# Patient Record
Sex: Female | Born: 1970
Health system: Southern US, Community
[De-identification: ages and names within clinical notes are randomized; demographics above are authoritative.]

## PROBLEM LIST (undated history)

## (undated) ENCOUNTER — Emergency Department (HOSPITAL_COMMUNITY): Discharge status: Against Medical Advice | Payer: BC Managed Care – PPO

## (undated) ENCOUNTER — Emergency Department (HOSPITAL_COMMUNITY): Admission: EM | Payer: Self-pay | Source: Home / Self Care

## (undated) DIAGNOSIS — B1910 Unspecified viral hepatitis B without hepatic coma: Secondary | ICD-10-CM

## (undated) DIAGNOSIS — I82409 Acute embolism and thrombosis of unspecified deep veins of unspecified lower extremity: Secondary | ICD-10-CM

## (undated) DIAGNOSIS — J302 Other seasonal allergic rhinitis: Secondary | ICD-10-CM

## (undated) DIAGNOSIS — E119 Type 2 diabetes mellitus without complications: Secondary | ICD-10-CM

## (undated) DIAGNOSIS — E7800 Pure hypercholesterolemia, unspecified: Secondary | ICD-10-CM

## (undated) DIAGNOSIS — I2724 Chronic thromboembolic pulmonary hypertension: Secondary | ICD-10-CM

## (undated) DIAGNOSIS — I2699 Other pulmonary embolism without acute cor pulmonale: Secondary | ICD-10-CM

## (undated) DIAGNOSIS — D6859 Other primary thrombophilia: Secondary | ICD-10-CM

## (undated) HISTORY — DX: Type 2 diabetes mellitus without complications: E11.9

## (undated) HISTORY — DX: Other primary thrombophilia: D68.59

## (undated) HISTORY — DX: Unspecified viral hepatitis B without hepatic coma: B19.10

## (undated) HISTORY — PX: VENA CAVA FILTER PLACEMENT: SUR1032

## (undated) HISTORY — DX: Pure hypercholesterolemia, unspecified: E78.00

## (undated) HISTORY — DX: Chronic thromboembolic pulmonary hypertension: I27.24

## (undated) HISTORY — DX: Acute embolism and thrombosis of unspecified deep veins of unspecified lower extremity: I82.409

## (undated) HISTORY — DX: Other seasonal allergic rhinitis: J30.2

## (undated) HISTORY — DX: Other pulmonary embolism without acute cor pulmonale: I26.99

---

## 1999-11-25 ENCOUNTER — Emergency Department (HOSPITAL_COMMUNITY): Admission: EM | Admit: 1999-11-25 | Discharge: 1999-11-25 | Payer: Self-pay | Admitting: *Deleted

## 2000-11-21 ENCOUNTER — Inpatient Hospital Stay (HOSPITAL_COMMUNITY): Admission: AD | Admit: 2000-11-21 | Discharge: 2000-11-21 | Payer: Self-pay | Admitting: Obstetrics

## 2000-12-23 ENCOUNTER — Inpatient Hospital Stay (HOSPITAL_COMMUNITY): Admission: AD | Admit: 2000-12-23 | Discharge: 2000-12-23 | Payer: Self-pay | Admitting: *Deleted

## 2001-01-27 ENCOUNTER — Ambulatory Visit (HOSPITAL_COMMUNITY): Admission: RE | Admit: 2001-01-27 | Discharge: 2001-01-27 | Payer: Self-pay | Admitting: *Deleted

## 2001-02-16 ENCOUNTER — Ambulatory Visit (HOSPITAL_COMMUNITY): Admission: RE | Admit: 2001-02-16 | Discharge: 2001-02-16 | Payer: Self-pay | Admitting: *Deleted

## 2001-05-08 ENCOUNTER — Inpatient Hospital Stay (HOSPITAL_COMMUNITY): Admission: AD | Admit: 2001-05-08 | Discharge: 2001-05-08 | Payer: Self-pay | Admitting: *Deleted

## 2001-07-12 ENCOUNTER — Inpatient Hospital Stay (HOSPITAL_COMMUNITY): Admission: AD | Admit: 2001-07-12 | Discharge: 2001-07-16 | Payer: Self-pay | Admitting: Obstetrics

## 2001-07-13 ENCOUNTER — Encounter: Payer: Self-pay | Admitting: *Deleted

## 2003-08-03 DIAGNOSIS — I2699 Other pulmonary embolism without acute cor pulmonale: Secondary | ICD-10-CM

## 2003-08-03 HISTORY — DX: Other pulmonary embolism without acute cor pulmonale: I26.99

## 2004-01-28 IMAGING — CR DG ANKLE COMPLETE 3+V*R*
3 series · 3 of 3 positions shown · non-contrast
Comparison: none

CLINICAL DATA: Lateral malleolar pain.
 RIGHT [D3] VIEW:
 There is mild lateral soft tissue swelling but no evidence of osseous or articular pathology.

[view not recorded (1 of 3)]
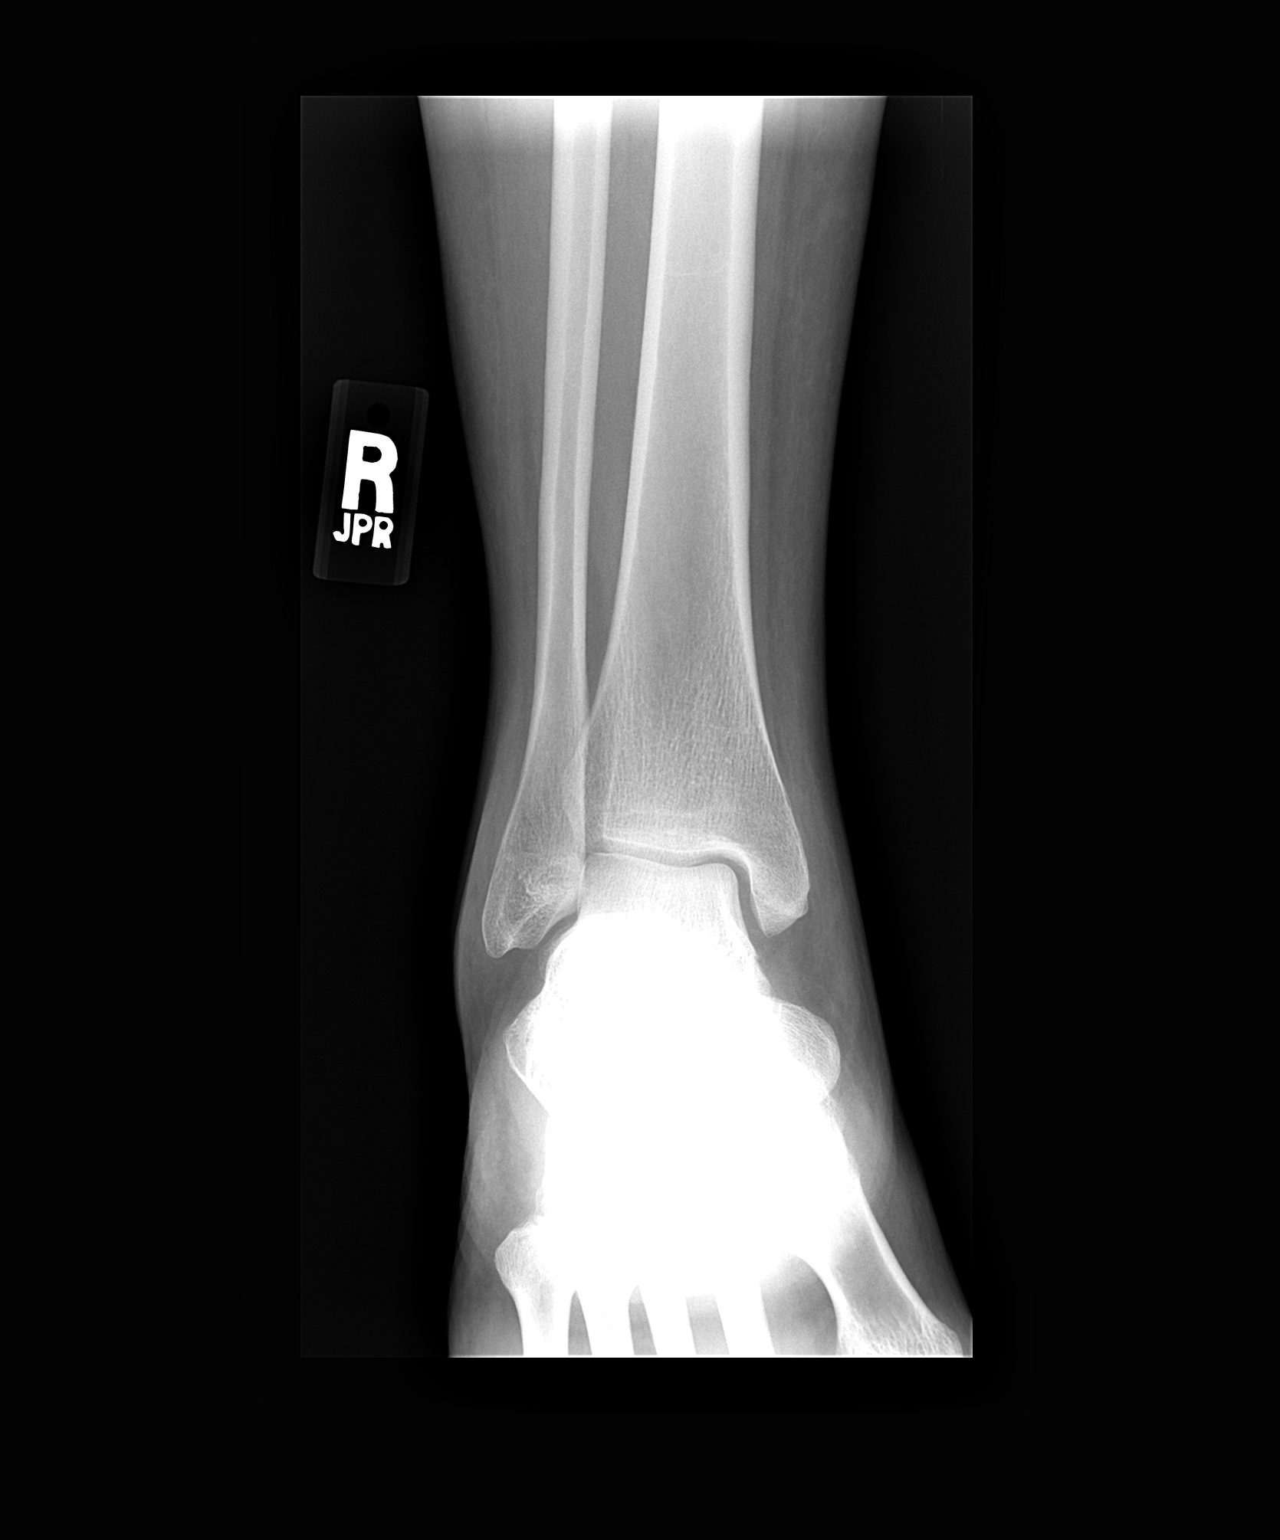

[view not recorded (2 of 3)]
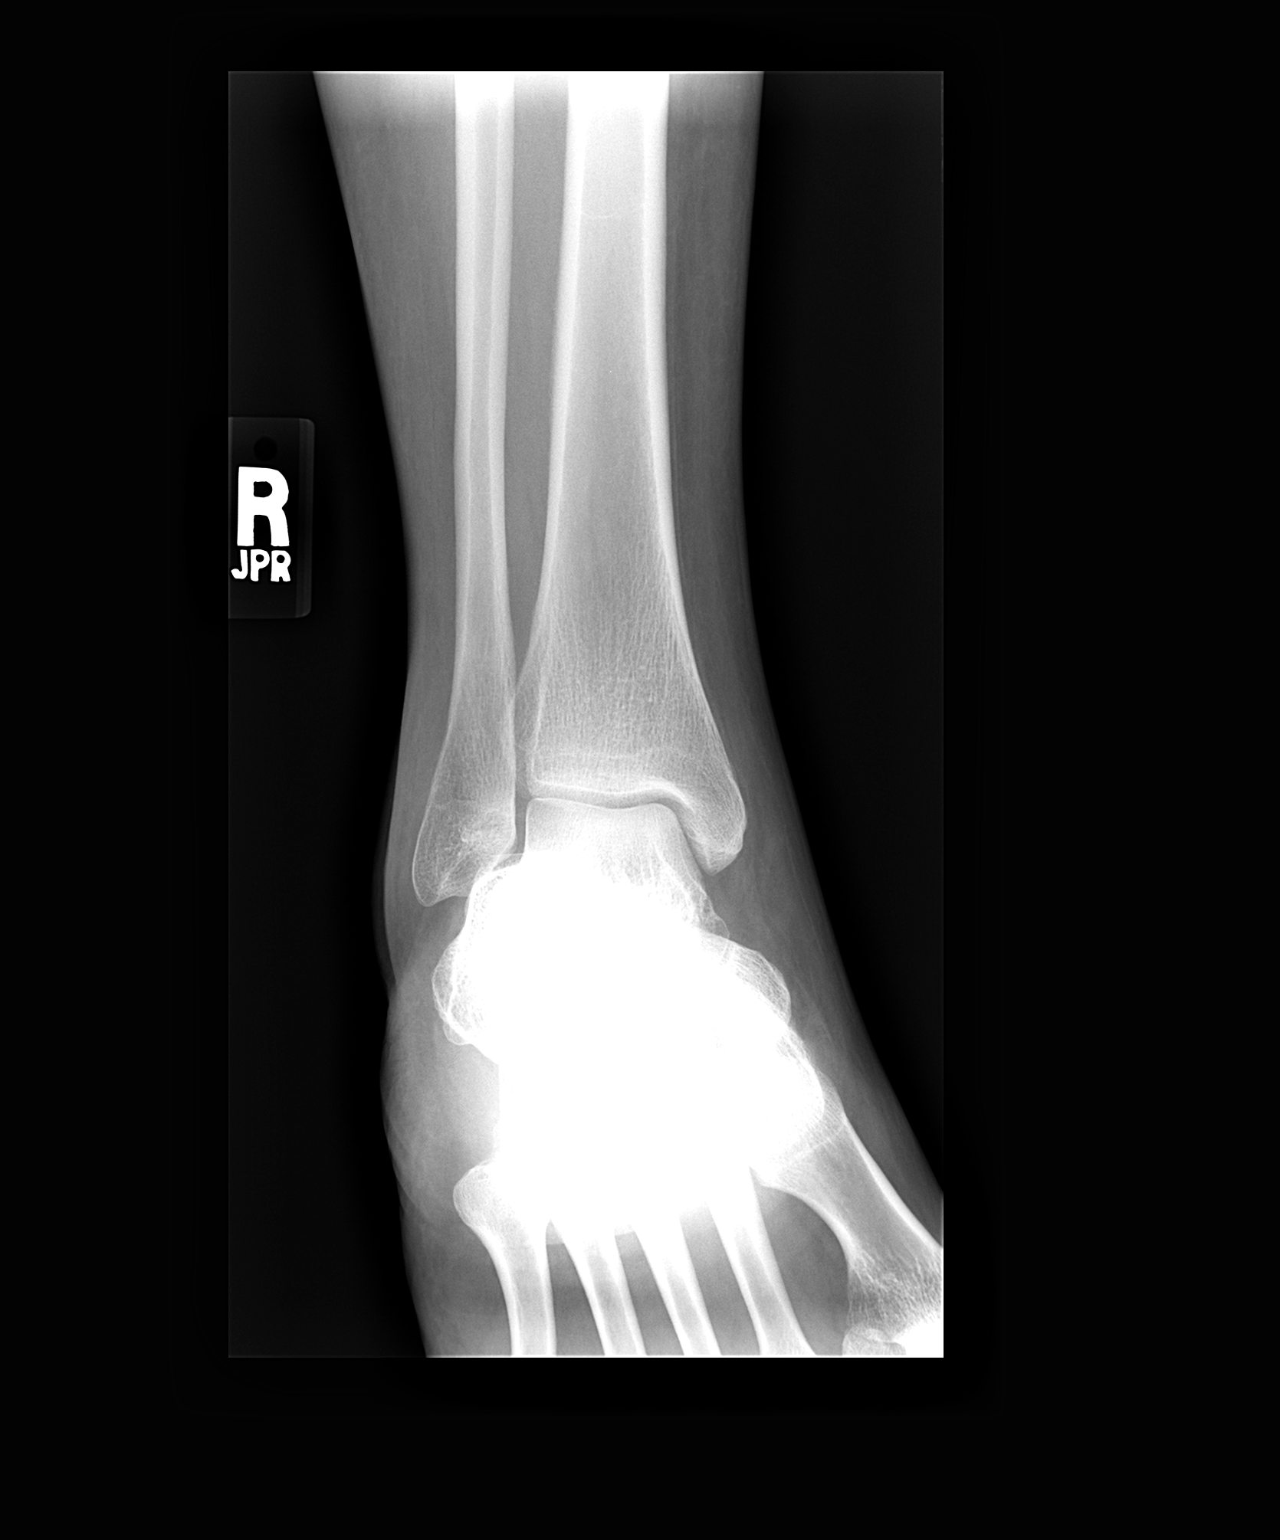

[view not recorded (3 of 3)]
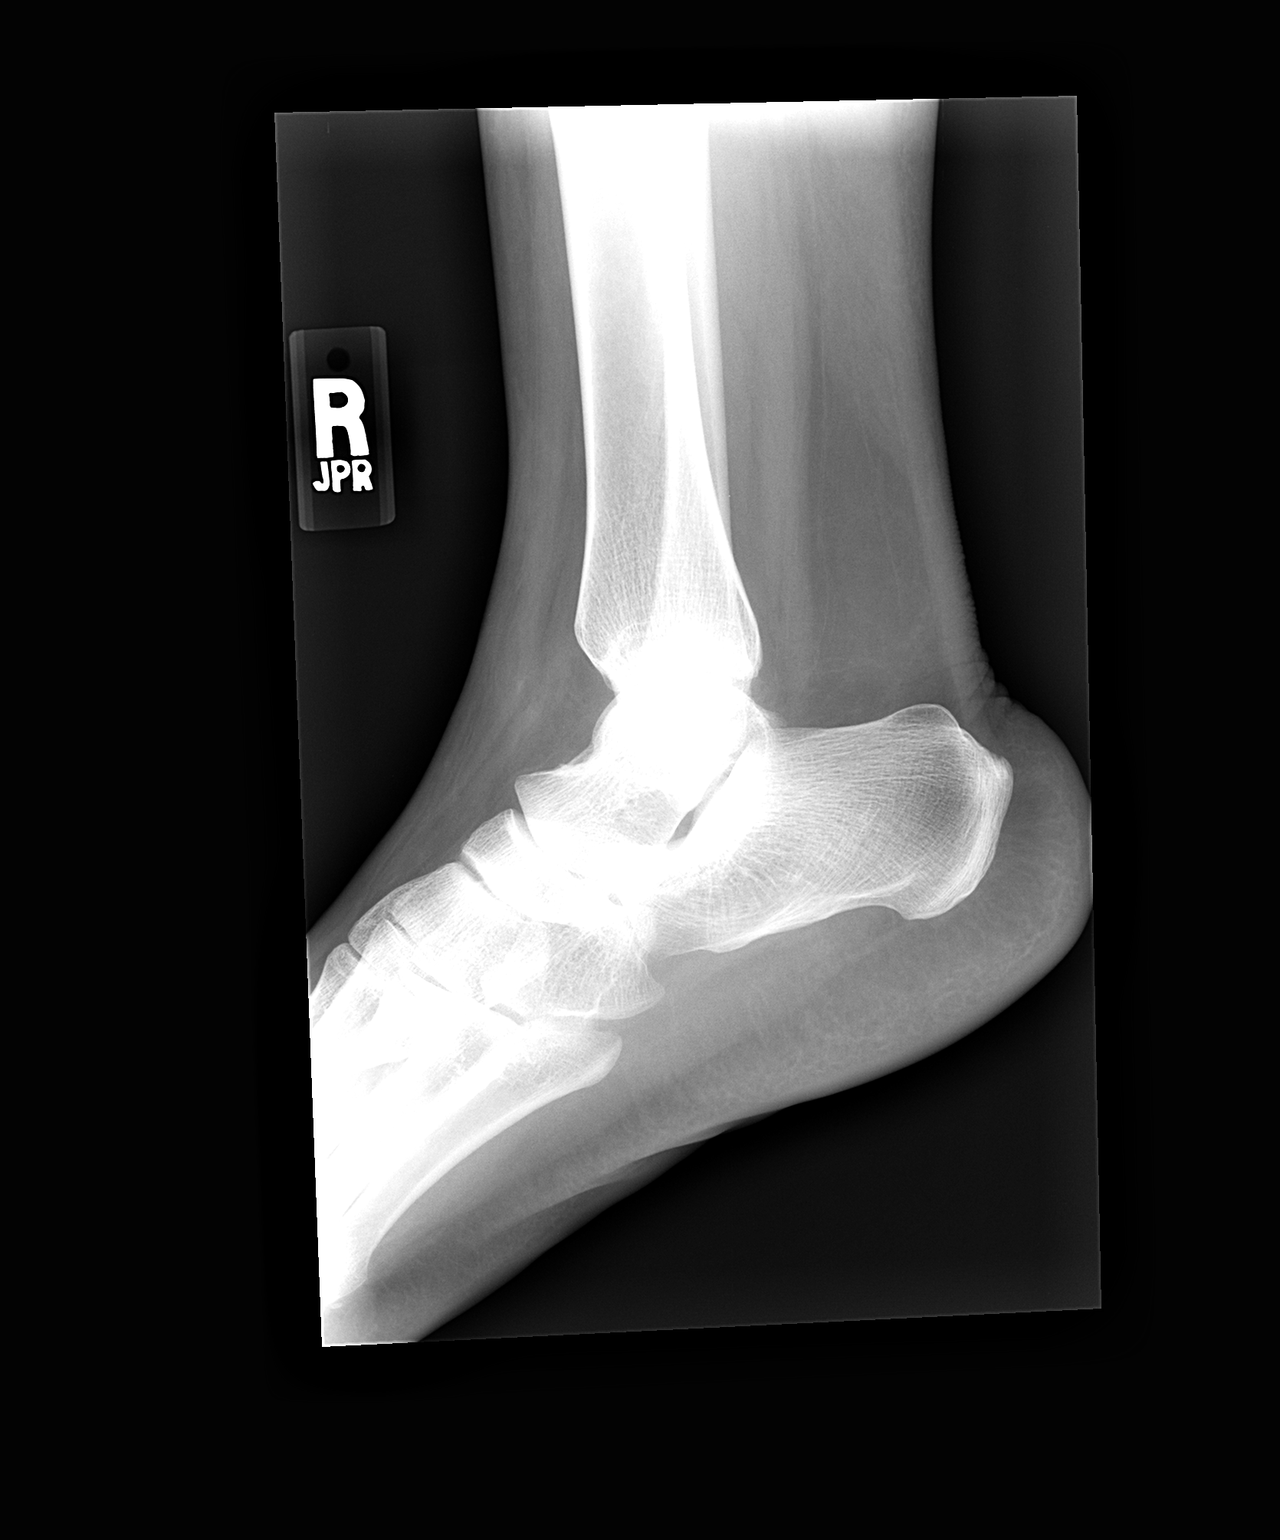

[3 of 3 positions shown; findings below may reference images not displayed]

IMPRESSION: See above report.

## 2004-03-30 ENCOUNTER — Emergency Department (HOSPITAL_COMMUNITY): Admission: EM | Admit: 2004-03-30 | Discharge: 2004-03-30 | Payer: Self-pay | Admitting: Emergency Medicine

## 2004-04-01 ENCOUNTER — Ambulatory Visit: Payer: Self-pay | Admitting: Internal Medicine

## 2004-04-01 ENCOUNTER — Inpatient Hospital Stay (HOSPITAL_COMMUNITY): Admission: EM | Admit: 2004-04-01 | Discharge: 2004-04-08 | Payer: Self-pay | Admitting: Emergency Medicine

## 2004-04-01 IMAGING — CR DG CHEST 1V PORT
1 series · 1 of 1 positions shown · non-contrast
Comparison: none

CLINICAL DATA: Chest pain with shortness of breath.  
 PORTABLE CHEST  
 No prior exams for comparison.
 There is an abnormal density in the left upper lobe contiguous with the left mediastinum. This is likely pneumonia  Unusual mass cannot be excluded.  Recommend PA and lateral for further assessment.  No pleural fluid.  Right lung clear. 
 IMPRESSION
 Abnormal density in the left upper lobe ? favor pneumonia over mass.  Recommend PA and lateral chest.

[view not recorded]
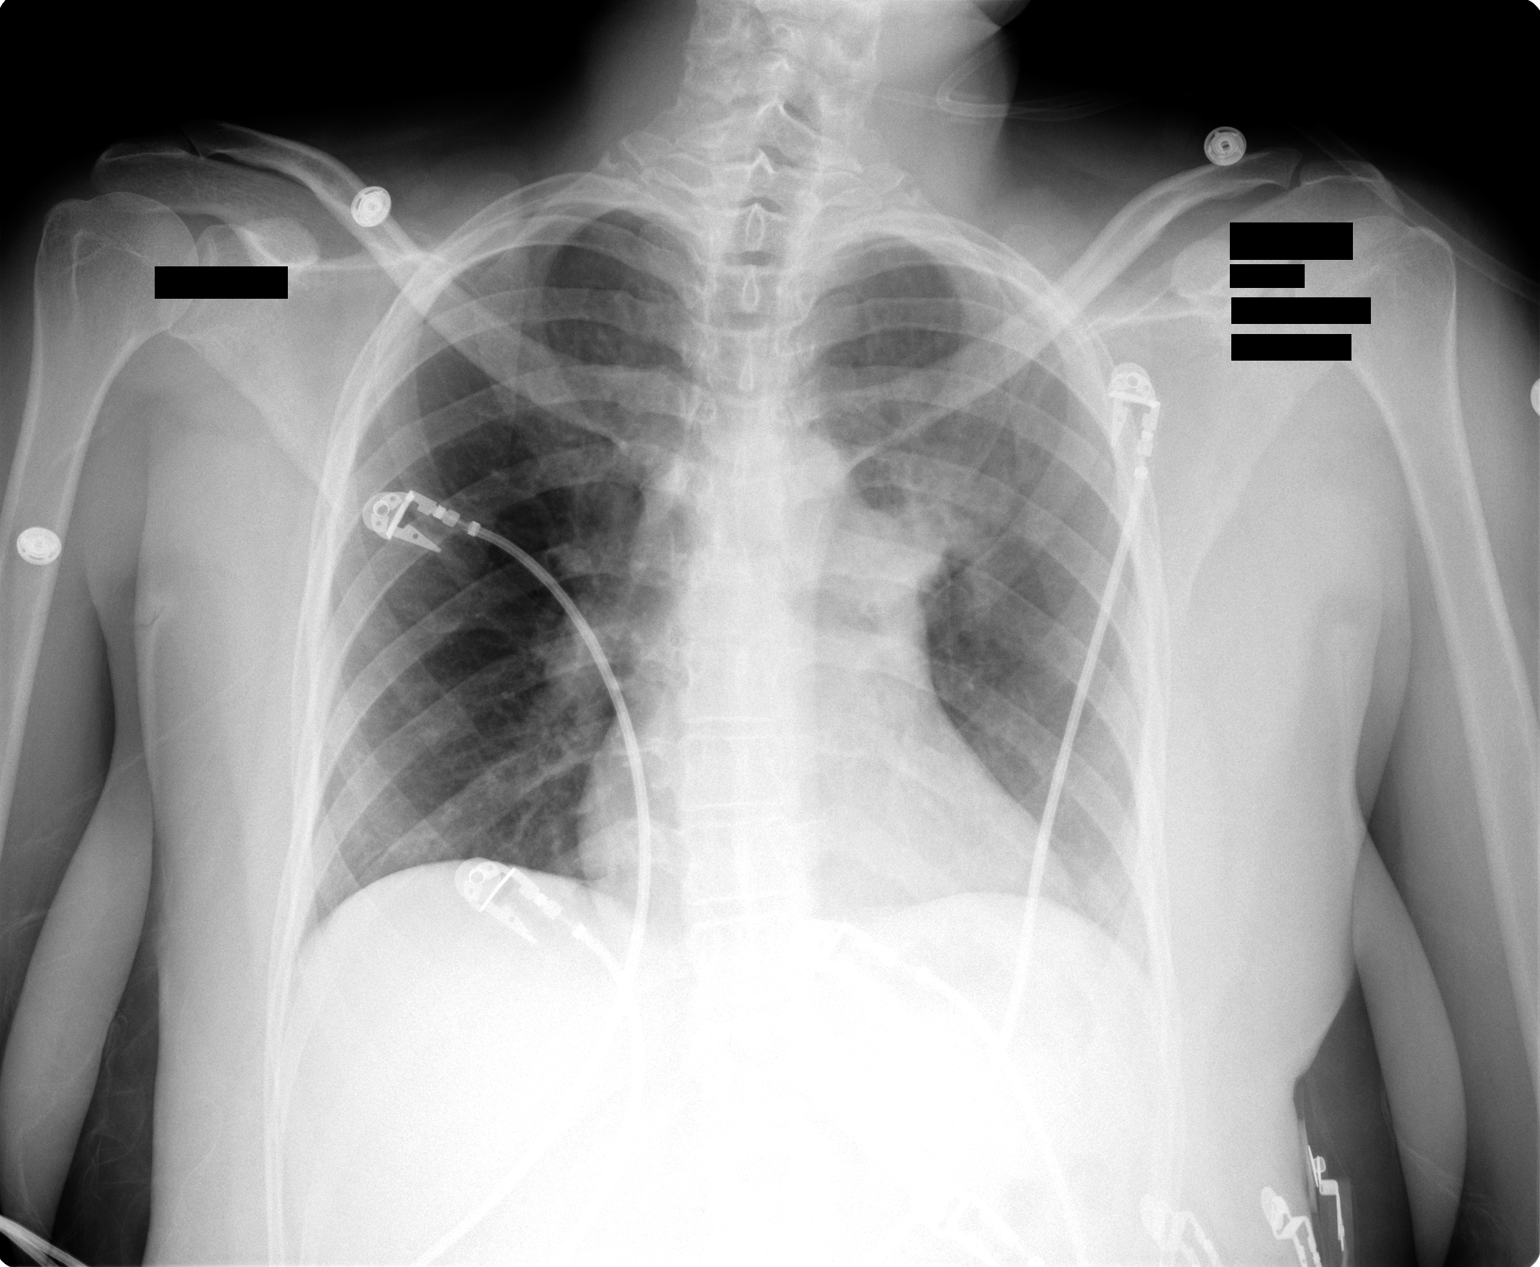

[1 of 1 positions shown; findings below may reference images not displayed]

## 2004-04-01 IMAGING — CT CT ANGIO CHEST
3 of 5 series · 16 of 30 positions shown · IV contrast ([ID] OMNI 300)
Comparison: none

CLINICAL DATA: Shortness of breath.  Left upper lobe mass on plain film.
CHEST CT ANGIO WITH CONTRAST:

[Series 5: recon 3: don't forget off/on re · axial · 0.59mm/px · z∈[-236,-30]mm · 12 of 199 slices shown]
[im 17/199  lung]
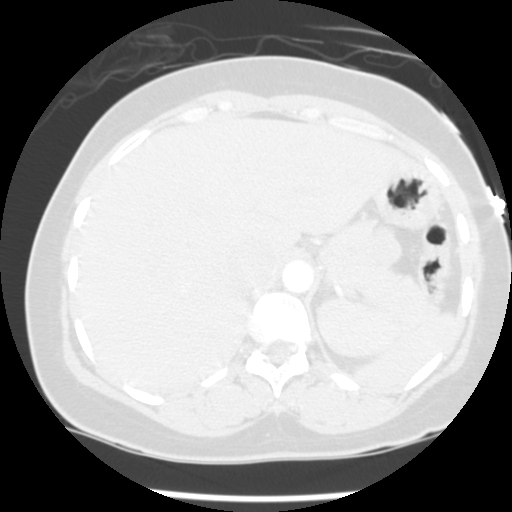
[im 34/199  mediastinal]
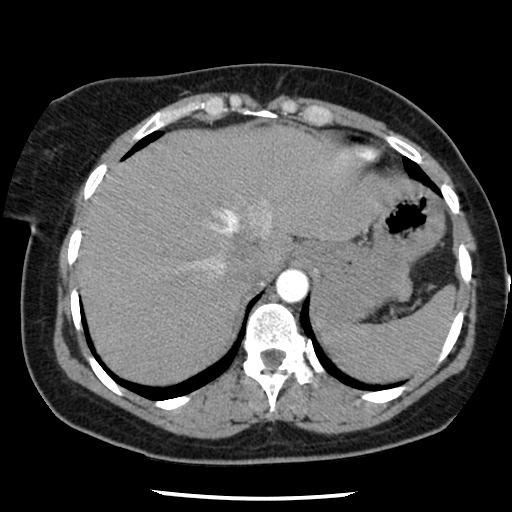
[im 50/199  lung]
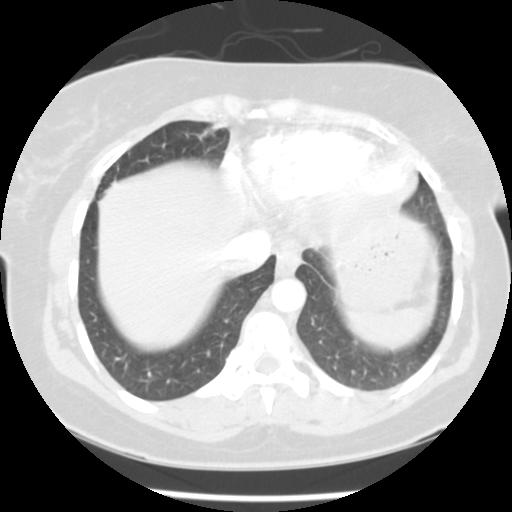
[im 67/199  mediastinal]
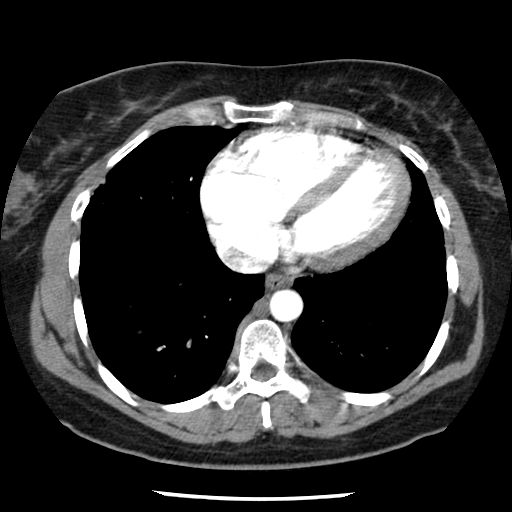
[im 83/199  lung]
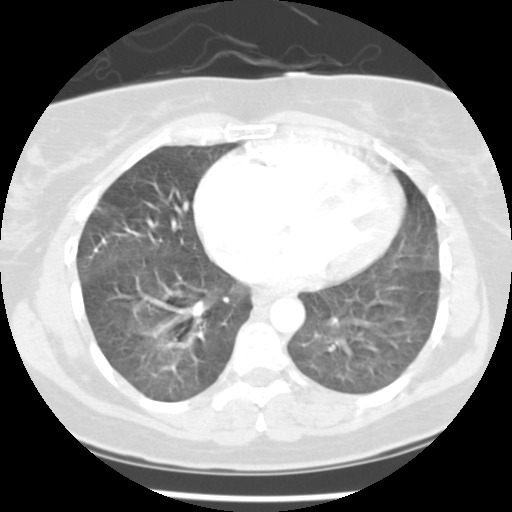
[im 100/199  mediastinal]
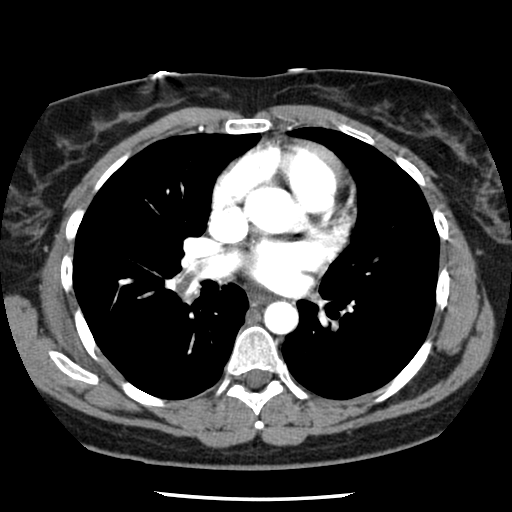
[im 103/199  lung]
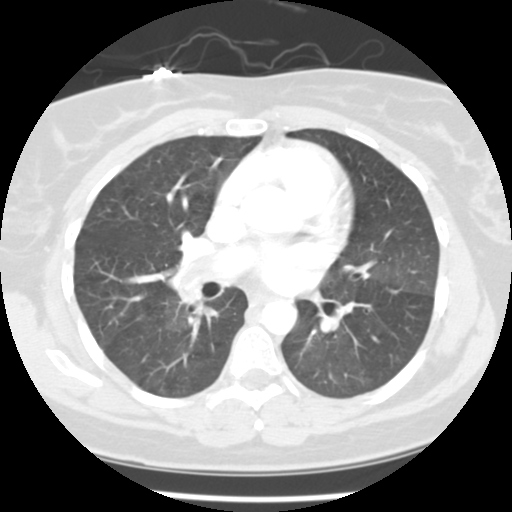
[im 116/199  mediastinal]
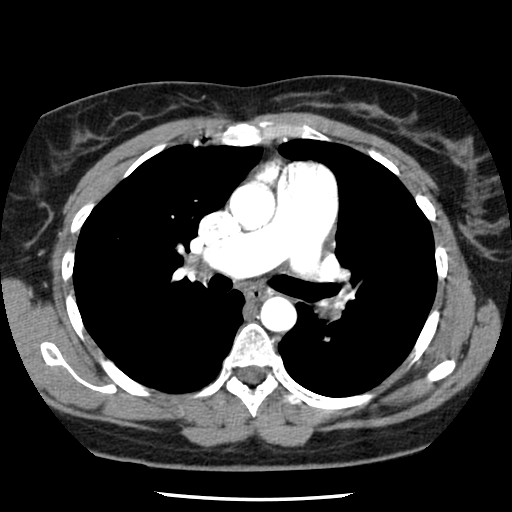
[im 133/199  lung]
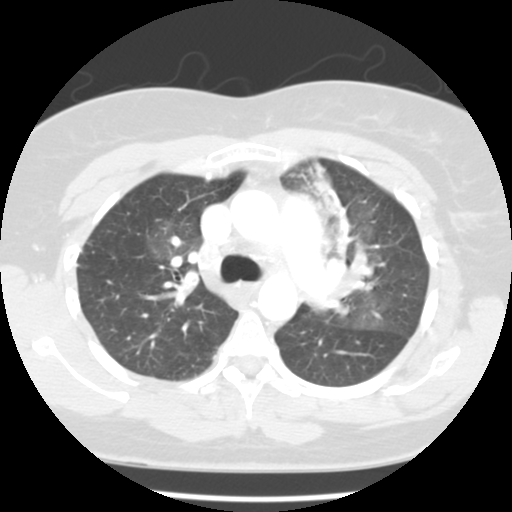
[im 149/199  mediastinal]
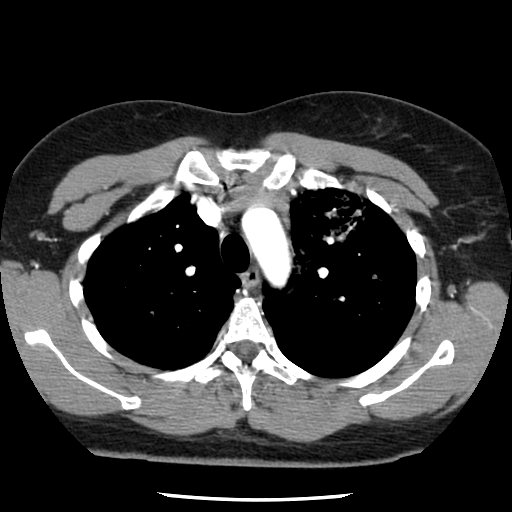
[im 166/199  lung]
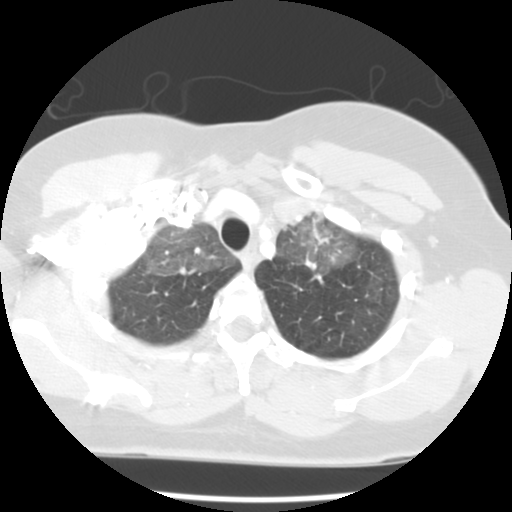
[im 182/199  mediastinal]
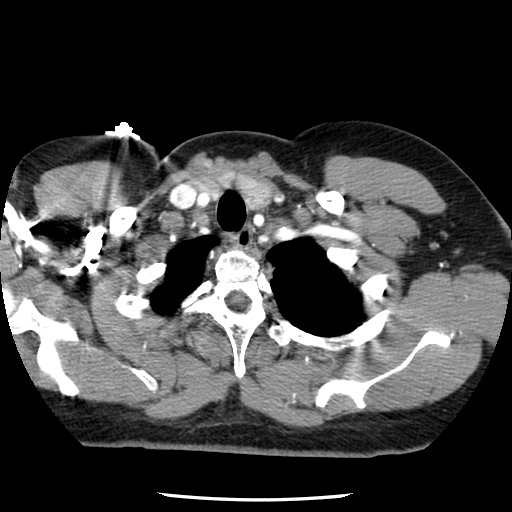

[Series 500: reformatted · sagittal · 0.59mm/px · 2 of 61 slices shown (1 of 2)]
[im 21/61  lung]
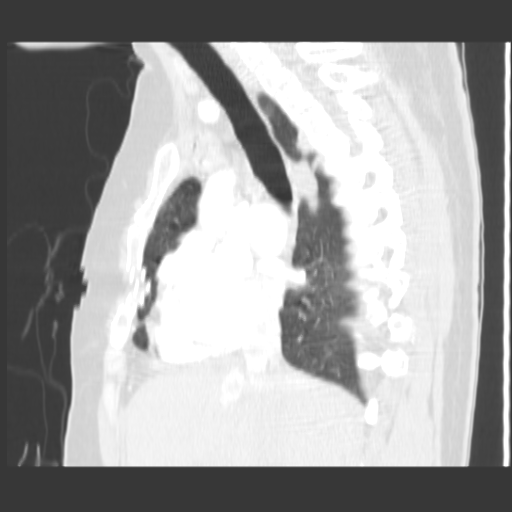
[im 41/61  lung]
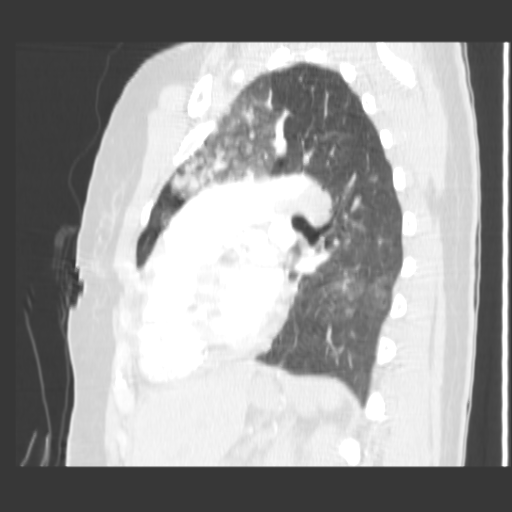

[Series 501: reformatted · coronal · 0.59mm/px · 2 of 61 slices shown (2 of 2)]
[im 21/61  lung]
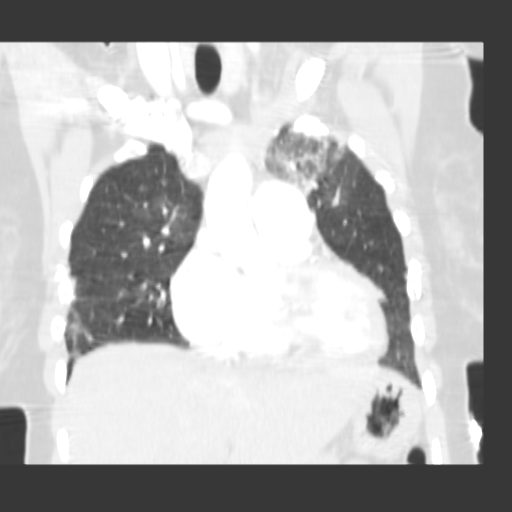
[im 41/61  lung]
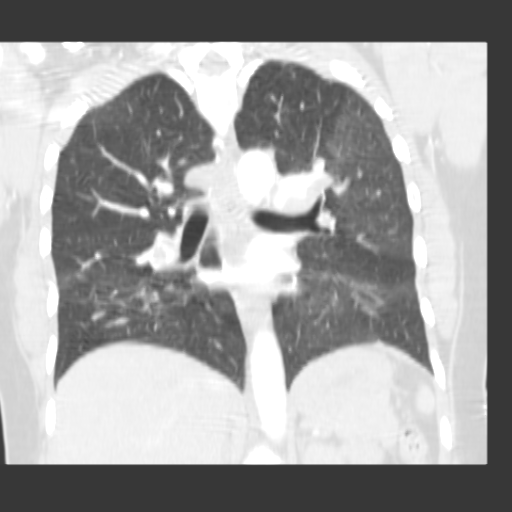

[16 of 30 positions shown; findings below may reference images not displayed]

Multidetector CT imaging of the chest was performed according to the protocol for detection of pulmonary embolism during IV bolus injection of 120 ml Omnipaque 300.  Coronal and sagittal plane reformatted images were also generated.  
There is substantial clot burden identified in both main pulmonary arteries as well as the left upper lobar pulmonary artery and segmental arteries to the right upper lobe.  Clot extends to the lobar pulmonary arteries to both lower lobes.  The heart size is within normal limits.  The main pulmonary outflow tract is enlarged and there is paradoxical reversal of the intraventricular septum, suggesting increased right ventricular heart pressures.  
There is no axillary, mediastinal or hilar lymphadenopathy.  No pleural fluid collections are identified.
Lung windows reveal air space opacity in the anterior left upper lobe with ground glass attenuation in the apex of the right lung.  No other areas of focal air space disease are identified.
A 1 cm fluid density structure in the subcutaneous tissues of the left posterolateral back is likely a sebaceous cyst.
IMPRESSION
1.  Very large embolic burden in both main pulmonary arteries, extending into the lobar and segmental pulmonary arteries to both upper and lower lobes.  Main pulmonary outflow tract is enlarged and there is paradoxical reversal of the intraventricular septum, suggesting elevated right heart pressures.
2.  This report was called to Dr. FERIENHAUS in the [HOSPITAL] the time of interpretation.  
3.  Infarct in the left upper lobe.
4.  Probable sebaceous cyst in the left back.
IMPRESSION:

## 2004-04-01 IMAGING — CR DG CHEST 2V
2 series · 2 of 2 positions shown · non-contrast
Comparison: [DATE].

CLINICAL DATA: 33-year-old male.  Shortness of breath with a left upper lobe focal abnormality.

[view not recorded (1 of 2)]
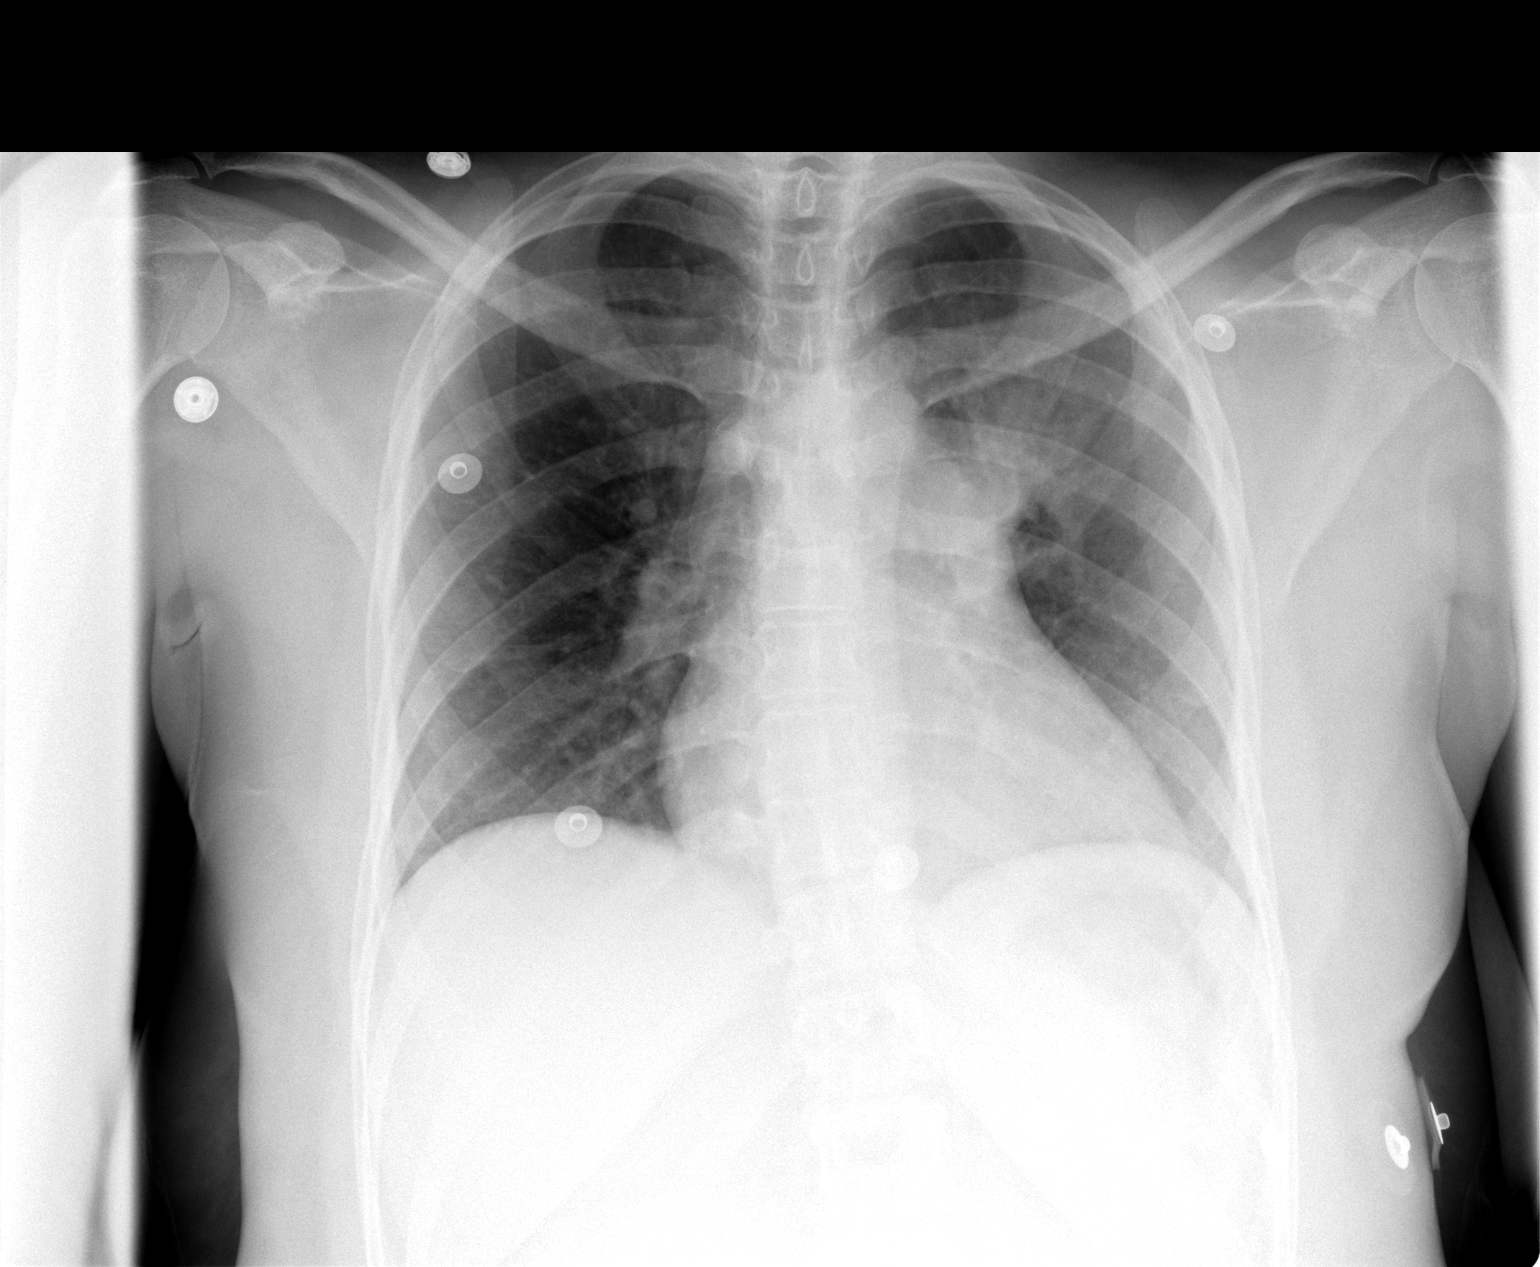

[view not recorded (2 of 2)]
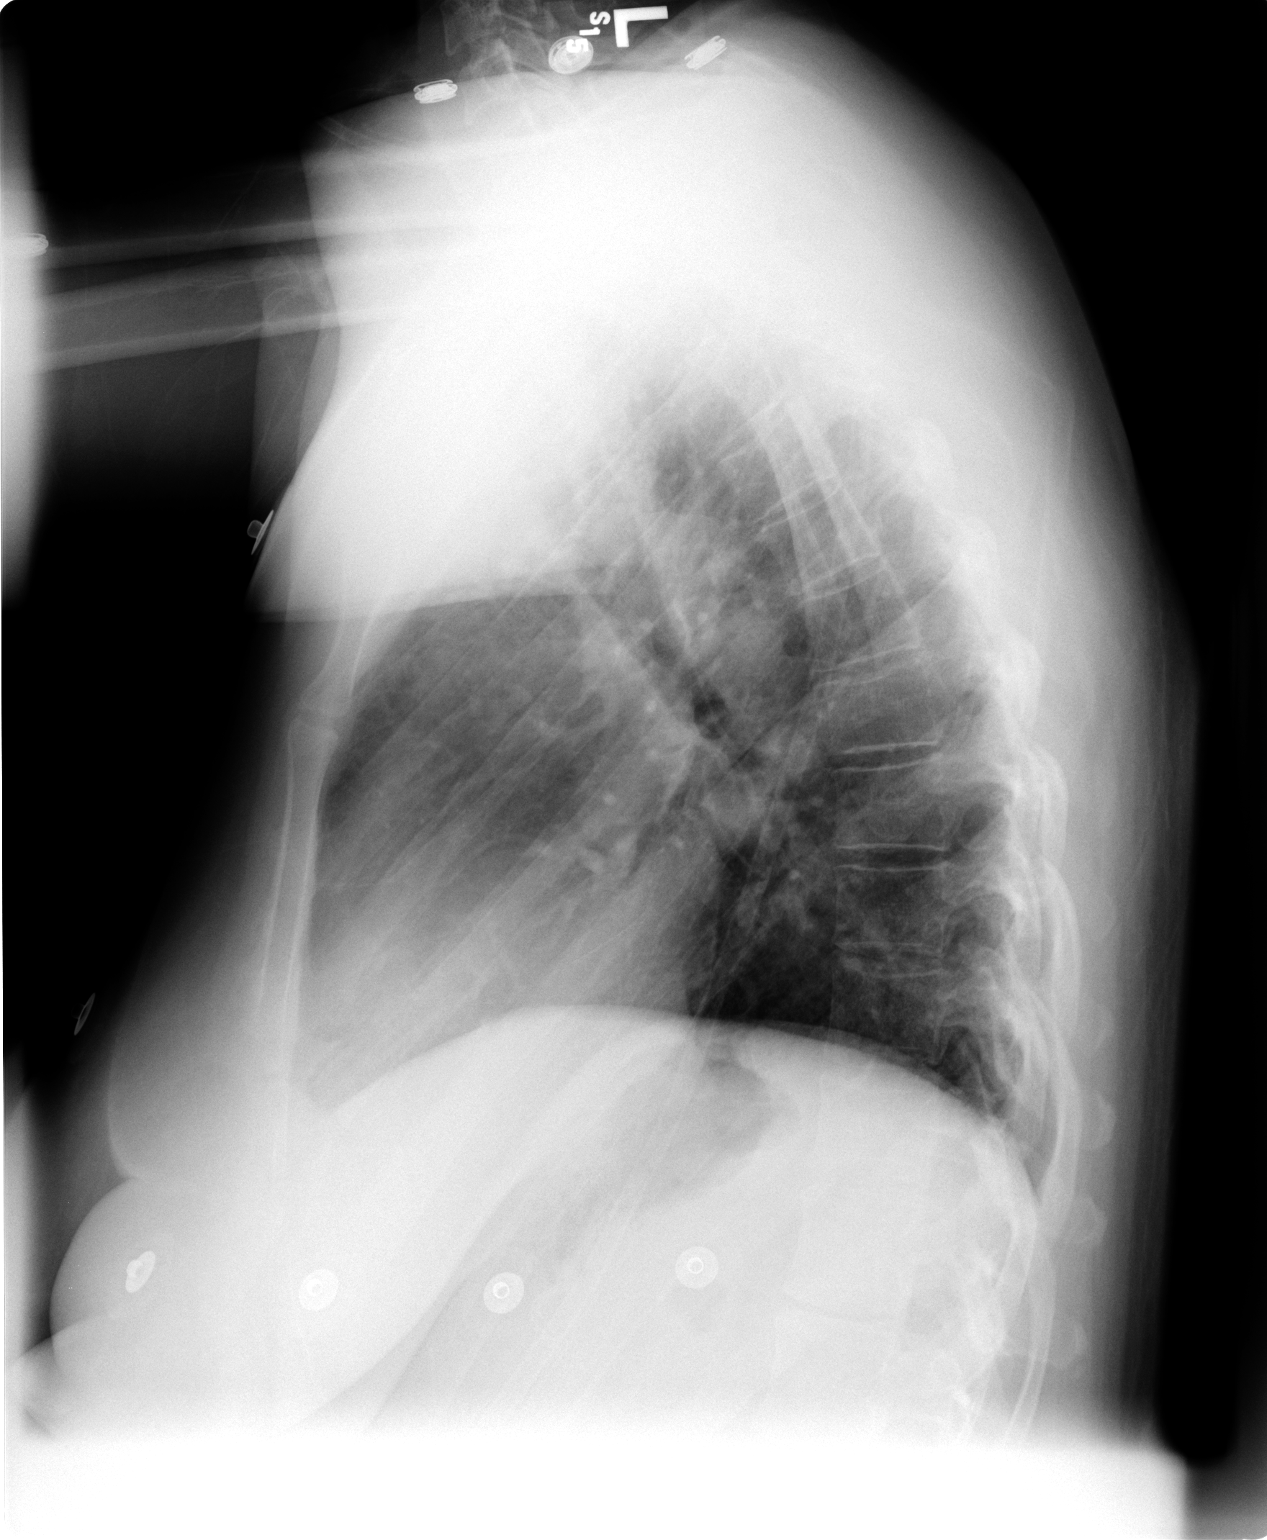

[2 of 2 positions shown; findings below may reference images not displayed]

TWO VIEW CHEST
 There is redemonstration of a left perihilar focal air space opacity concerning for pneumonia or underlying mass.  No associated effusion, CHF, or pneumothorax.  The heart size is prominent for age.  On the lateral view, there is the suggestion of perihilar and mediastinal lymphadenopathy.  
 IMPRESSION
 1.  Left upper lobe focal air space opacity concerning for underlying pneumonia versus parenchymal mass.
 2.  Suggestion of mediastinal and perihilar lymphadenopathy on the lateral view.
 3.  Recommend further evaluation with chest CT with contrast.

## 2004-04-09 ENCOUNTER — Ambulatory Visit: Payer: Self-pay | Admitting: Internal Medicine

## 2004-04-16 ENCOUNTER — Ambulatory Visit: Payer: Self-pay | Admitting: Internal Medicine

## 2004-04-20 ENCOUNTER — Ambulatory Visit: Payer: Self-pay | Admitting: Internal Medicine

## 2004-04-27 ENCOUNTER — Ambulatory Visit: Payer: Self-pay | Admitting: Internal Medicine

## 2004-05-04 ENCOUNTER — Ambulatory Visit: Payer: Self-pay | Admitting: Internal Medicine

## 2004-05-11 ENCOUNTER — Ambulatory Visit: Payer: Self-pay | Admitting: Internal Medicine

## 2004-06-01 ENCOUNTER — Ambulatory Visit: Payer: Self-pay | Admitting: Internal Medicine

## 2004-06-15 ENCOUNTER — Ambulatory Visit: Payer: Self-pay | Admitting: Internal Medicine

## 2004-06-20 IMAGING — CR DG CHEST 1V PORT
1 series · 1 of 1 positions shown · non-contrast
Comparison: CT chest [DATE] and chest x-ray on [DATE].

CLINICAL DATA: 34-year-old with shortness of breath.  
 PORTABLE CHEST - 1 VIEW [DATE]:

[view not recorded]
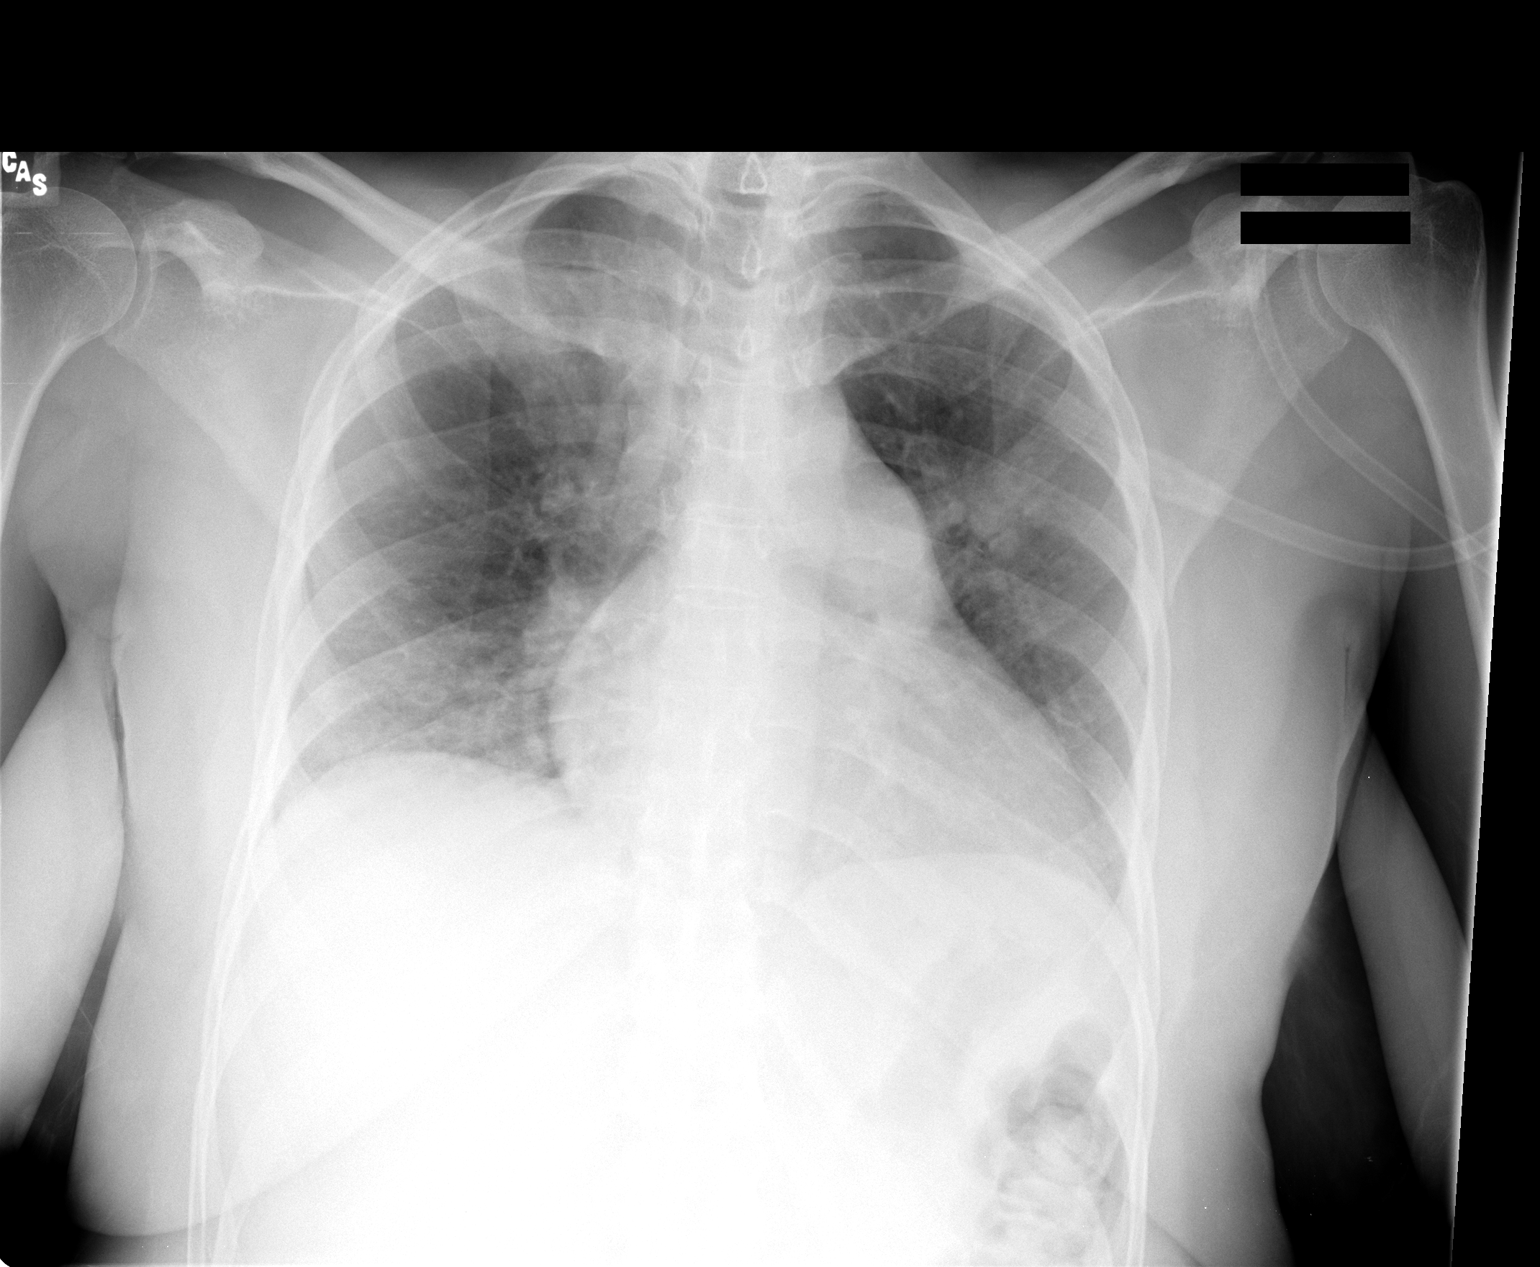

[1 of 1 positions shown; findings below may reference images not displayed]

FINDINGS: The heart is enlarged.  There has been an increase in patchy airspace filling bilaterally.  The findings may be related to edema, alveolitis or infectious process.
IMPRESSION: Increased airspace filling.

## 2004-06-29 ENCOUNTER — Ambulatory Visit: Payer: Self-pay | Admitting: Internal Medicine

## 2004-06-30 ENCOUNTER — Ambulatory Visit: Payer: Self-pay | Admitting: Internal Medicine

## 2004-07-13 ENCOUNTER — Ambulatory Visit: Payer: Self-pay | Admitting: Internal Medicine

## 2004-08-10 ENCOUNTER — Ambulatory Visit: Payer: Self-pay | Admitting: Internal Medicine

## 2004-08-31 ENCOUNTER — Ambulatory Visit: Payer: Self-pay | Admitting: Internal Medicine

## 2004-09-17 ENCOUNTER — Ambulatory Visit (HOSPITAL_COMMUNITY): Admission: RE | Admit: 2004-09-17 | Discharge: 2004-09-17 | Payer: Self-pay | Admitting: Internal Medicine

## 2004-09-17 ENCOUNTER — Ambulatory Visit: Payer: Self-pay | Admitting: Internal Medicine

## 2004-09-17 ENCOUNTER — Encounter (INDEPENDENT_AMBULATORY_CARE_PROVIDER_SITE_OTHER): Payer: Self-pay | Admitting: Internal Medicine

## 2004-09-17 IMAGING — CT CT ANGIO CHEST
1 of 5 series · 14 of 30 positions shown · IV contrast ([ID] OMNI 300)
Comparison: Study of [DATE] which was positive for multiple bilateral emboli.

CLINICAL DATA: Evaluate for pulmonary emboli.
TECHNIQUE: Multidetector CT imaging of the chest was performed according to the protocol for detection of pulmonary embolism during IV bolus injection of 120 cc Omnipaque 300.  Coronal and sagittal plane reformatted images were also generated.   
 CHEST CT ANGIO WITH CONTRAST:

[Series 3: pe · axial · 0.61mm/px · z∈[-271,-53]mm · 14 of 407 slices shown]
[im 30/407  lung]
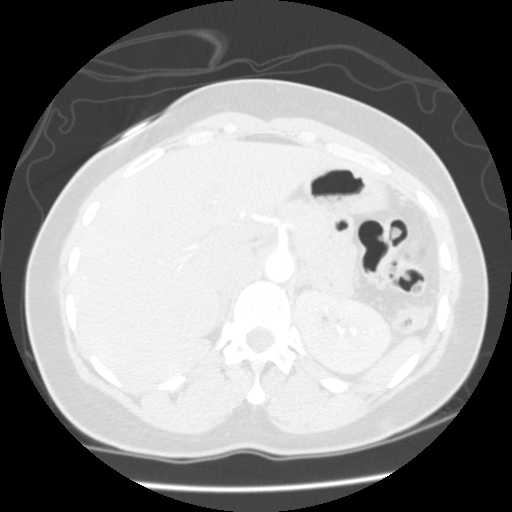
[im 59/407  mediastinal]
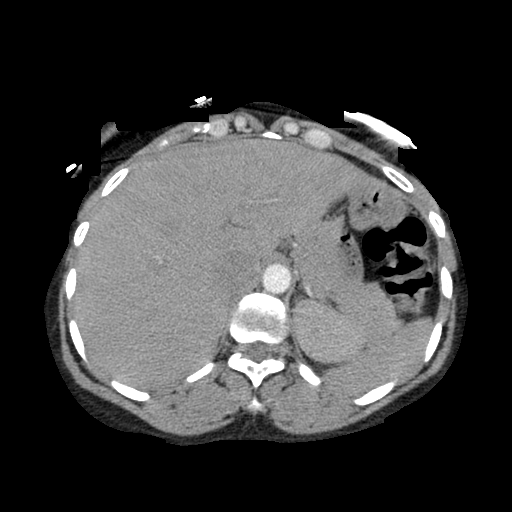
[im 88/407  lung]
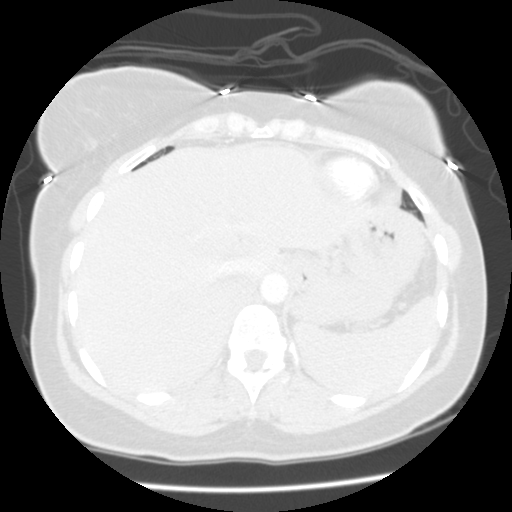
[im 117/407  mediastinal]
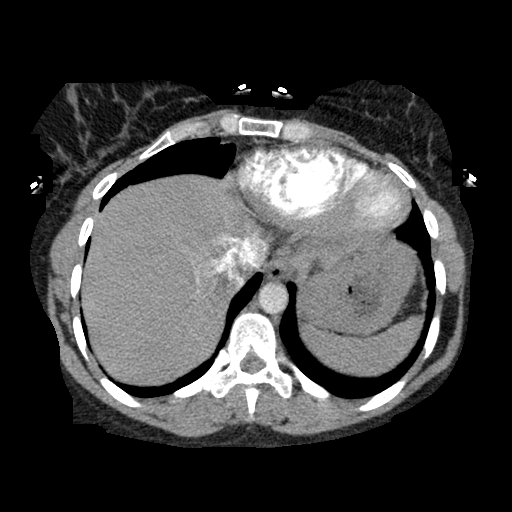
[im 146/407  lung]
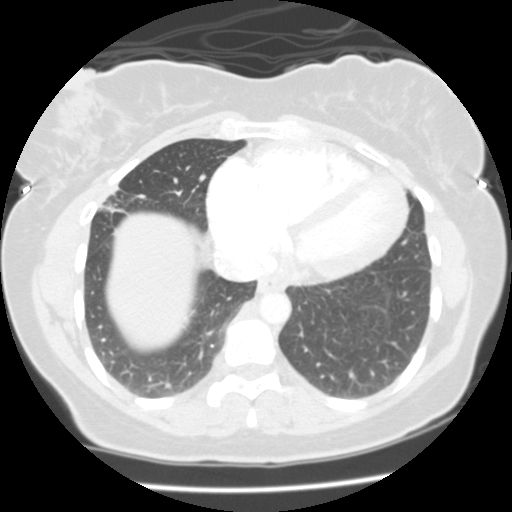
[im 175/407  mediastinal]
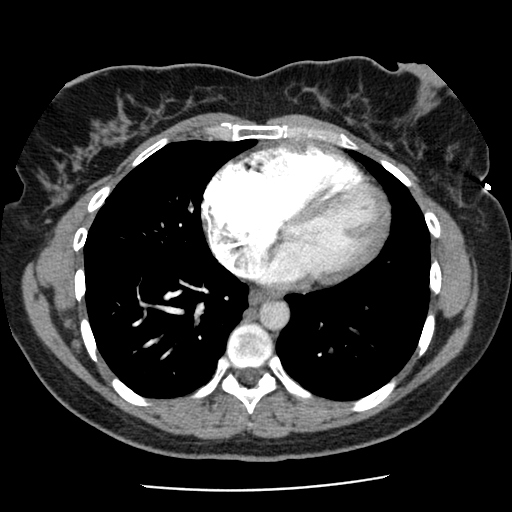
[im 188/407  lung]
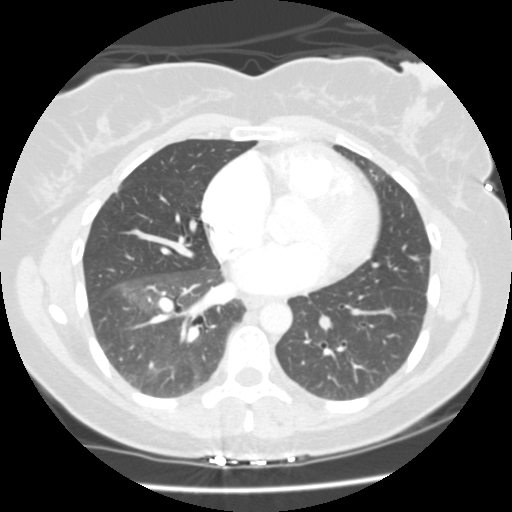
[im 204/407  mediastinal]
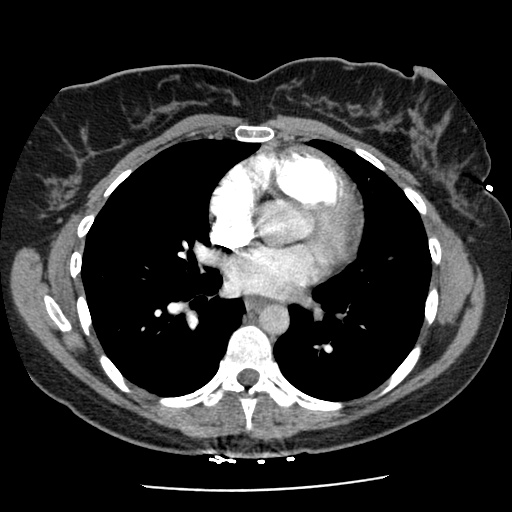
[im 233/407  lung]
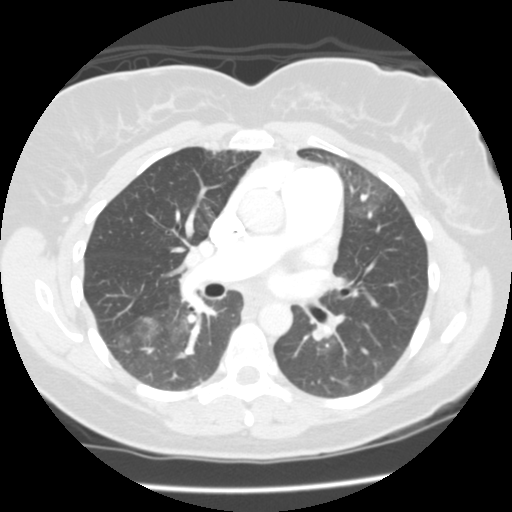
[im 262/407  mediastinal]
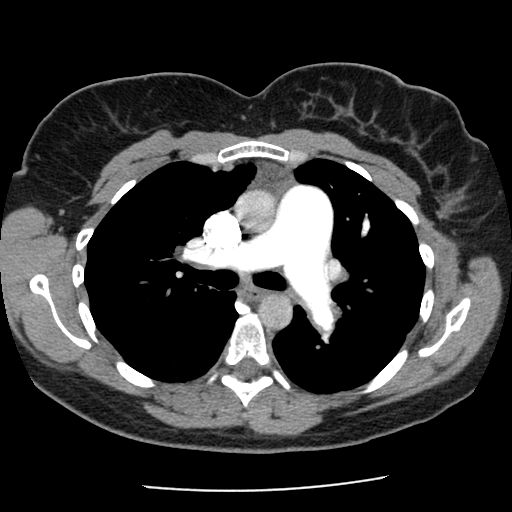
[im 291/407  lung]
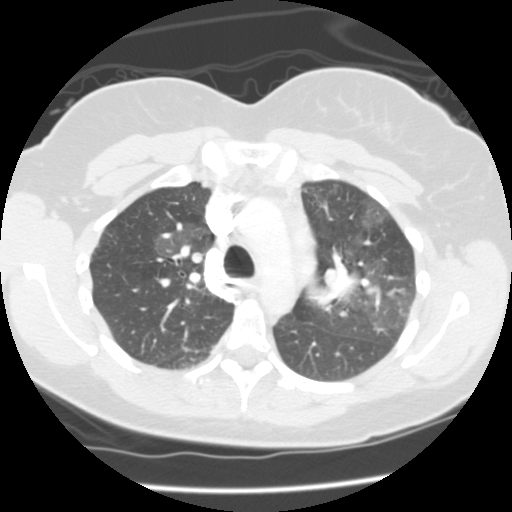
[im 320/407  mediastinal]
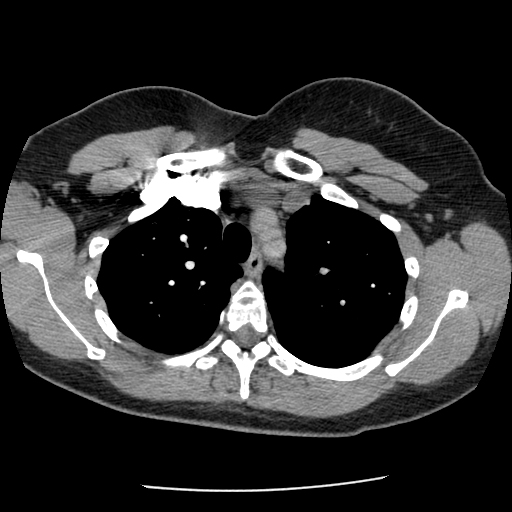
[im 349/407  lung]
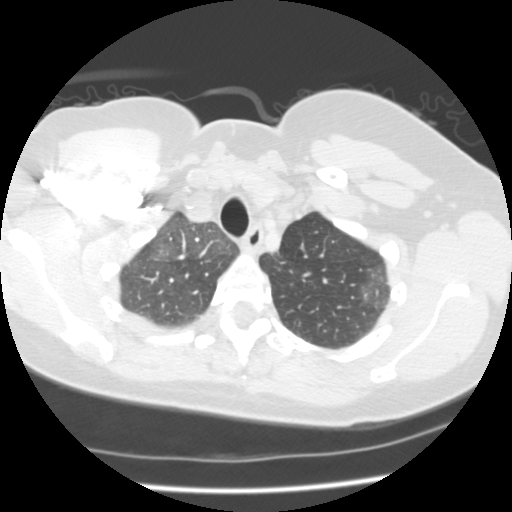
[im 378/407  mediastinal]
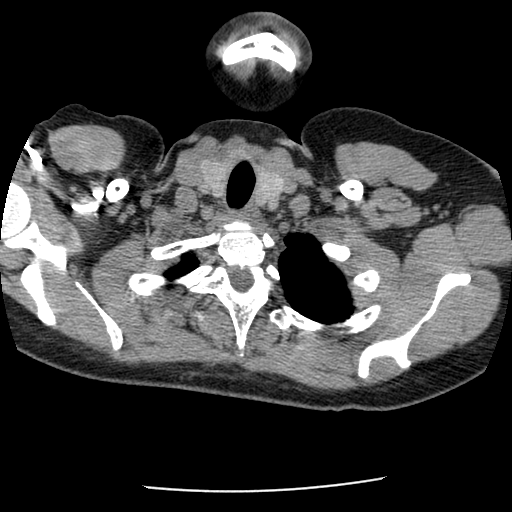

[14 of 30 positions shown; findings below may reference images not displayed]

FINDINGS: There are hazy areas of ground-glass opacity in both upper lobes and in the right lower lobe.  There was abnormal density in these areas on the study of last [REDACTED] and this could be a chronic finding.  Alternatively, it could possibly relate to pneumonitis.  There is no pleural or pericardial fluid.  There is no mediastinal mass.  The patient has a right hilar node which measures 15 mm.  There is a left hilar node that measures a centimeter.   I think there are a few subtle changes of thickening and stranding within the pulmonary arterial tree that I would attribute to chronic changes related to the extensive emboli seen last [REDACTED].  There is nothing that I think represents a convincing   recurrent embolus. 
 Scans in the upper abdomen show no significant finding.
IMPRESSION: 1.  There are changes of wall thickening and a few septations within the pulmonary arterial tree that I think likely relate to the extensive embolic events of last [REDACTED].  I do not see any finding that I can confidently   diagnosis as representing an acute embolus.  
 2.  Areas of ground-glass opacity in the upper lobes and in the right lower lobe.   Many of these areas showed abnormal density on the study of last [REDACTED].  The findings could represent chronic changes. Alternatively, these could be areas of hazy pneumonitis.

## 2004-09-23 ENCOUNTER — Ambulatory Visit: Payer: Self-pay | Admitting: Internal Medicine

## 2004-09-25 ENCOUNTER — Ambulatory Visit: Payer: Self-pay | Admitting: Internal Medicine

## 2004-09-28 ENCOUNTER — Ambulatory Visit: Payer: Self-pay | Admitting: Internal Medicine

## 2004-09-29 ENCOUNTER — Ambulatory Visit (HOSPITAL_COMMUNITY): Admission: RE | Admit: 2004-09-29 | Discharge: 2004-09-29 | Payer: Self-pay | Admitting: Internal Medicine

## 2004-10-02 ENCOUNTER — Ambulatory Visit: Payer: Self-pay | Admitting: Pulmonary Disease

## 2004-10-26 ENCOUNTER — Ambulatory Visit: Payer: Self-pay | Admitting: Internal Medicine

## 2004-11-20 ENCOUNTER — Ambulatory Visit: Payer: Self-pay | Admitting: Pulmonary Disease

## 2004-11-23 ENCOUNTER — Ambulatory Visit: Payer: Self-pay | Admitting: Internal Medicine

## 2004-12-04 ENCOUNTER — Ambulatory Visit: Payer: Self-pay | Admitting: Pulmonary Disease

## 2004-12-21 ENCOUNTER — Ambulatory Visit: Payer: Self-pay | Admitting: Internal Medicine

## 2004-12-30 ENCOUNTER — Ambulatory Visit: Payer: Self-pay | Admitting: Internal Medicine

## 2004-12-30 ENCOUNTER — Ambulatory Visit (HOSPITAL_COMMUNITY): Admission: RE | Admit: 2004-12-30 | Discharge: 2004-12-30 | Payer: Self-pay | Admitting: Internal Medicine

## 2005-01-05 ENCOUNTER — Ambulatory Visit: Payer: Self-pay | Admitting: Pulmonary Disease

## 2005-01-18 ENCOUNTER — Ambulatory Visit: Payer: Self-pay | Admitting: Internal Medicine

## 2005-01-19 ENCOUNTER — Ambulatory Visit: Payer: Self-pay

## 2005-02-08 ENCOUNTER — Ambulatory Visit: Payer: Self-pay | Admitting: Pulmonary Disease

## 2005-02-15 ENCOUNTER — Ambulatory Visit: Payer: Self-pay | Admitting: Internal Medicine

## 2005-03-08 ENCOUNTER — Ambulatory Visit: Payer: Self-pay | Admitting: Internal Medicine

## 2005-03-29 ENCOUNTER — Ambulatory Visit: Payer: Self-pay | Admitting: Internal Medicine

## 2005-03-29 ENCOUNTER — Ambulatory Visit: Payer: Self-pay | Admitting: Pulmonary Disease

## 2005-03-29 ENCOUNTER — Encounter (INDEPENDENT_AMBULATORY_CARE_PROVIDER_SITE_OTHER): Payer: Self-pay | Admitting: Internal Medicine

## 2005-04-19 ENCOUNTER — Ambulatory Visit: Payer: Self-pay | Admitting: Internal Medicine

## 2005-04-27 ENCOUNTER — Ambulatory Visit: Payer: Self-pay | Admitting: Hospitalist

## 2005-04-27 ENCOUNTER — Ambulatory Visit: Payer: Self-pay | Admitting: Cardiology

## 2005-04-27 ENCOUNTER — Encounter: Payer: Self-pay | Admitting: Cardiology

## 2005-04-27 ENCOUNTER — Inpatient Hospital Stay (HOSPITAL_COMMUNITY): Admission: EM | Admit: 2005-04-27 | Discharge: 2005-05-01 | Payer: Self-pay | Admitting: Emergency Medicine

## 2005-04-27 IMAGING — CT CT ANGIO CHEST
3 of 5 series · 18 of 46 positions shown · IV contrast (APPLIED)
Comparison: None.

CLINICAL DATA: Sudden onset of dyspnea and hyperventilation.  History of DVT and pulmonary embolism.  The patient reports being on anticoagulation currently.  
CT ANGIOGRAPHY OF CHEST:
TECHNIQUE: Multidetector CT imaging of the chest was performed during bolus injection of intravenous contrast.  Multiplanar CT angiographic image reconstructions were generated to evaluate the vascular anatomy.
Contrast:  80 cc Omnipaque 300

[Series 4: pulm embolism 2.0 st · axial · 0.49mm/px · z∈[-143,+73]mm · 13 of 127 slices shown (1 of 2)]
[im 10/127  lung]
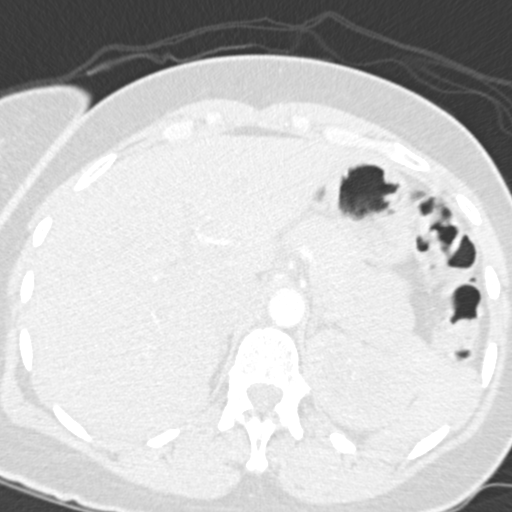
[im 19/127  soft-tissue]
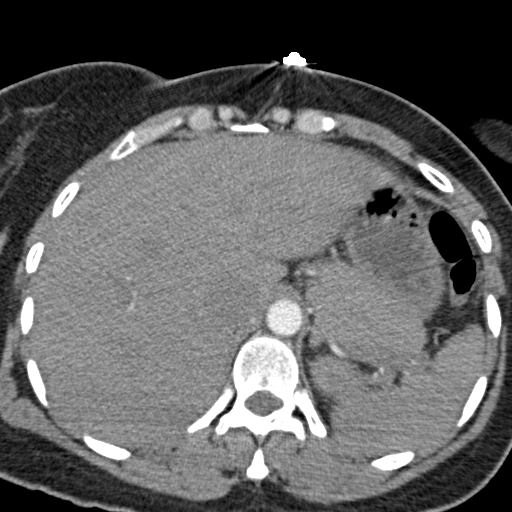
[im 28/127  lung]
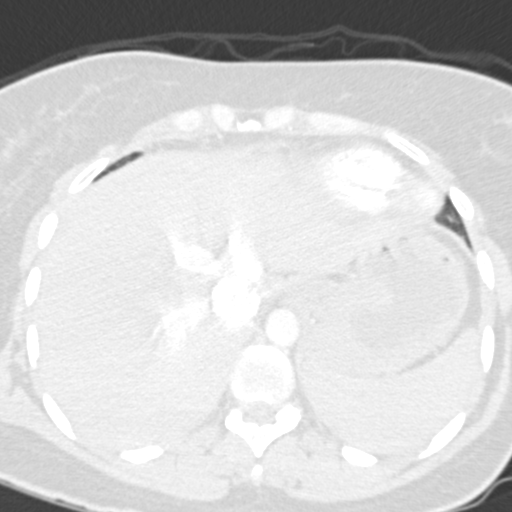
[im 37/127  soft-tissue]
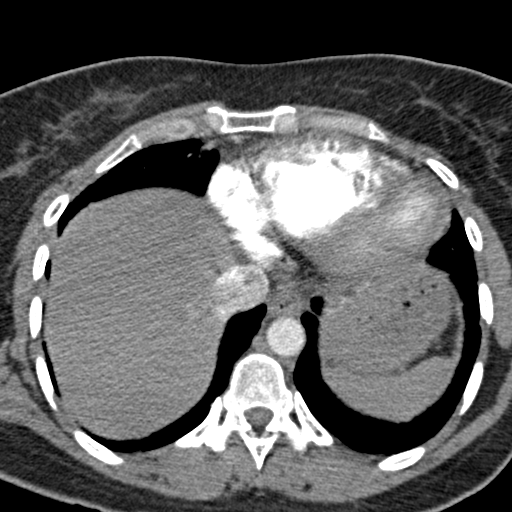
[im 46/127  lung]
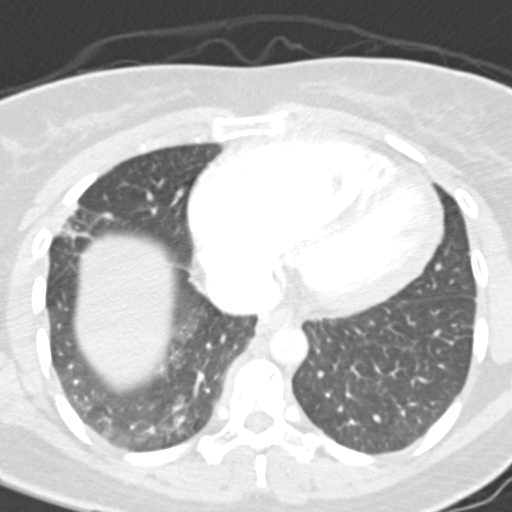
[im 55/127  soft-tissue]
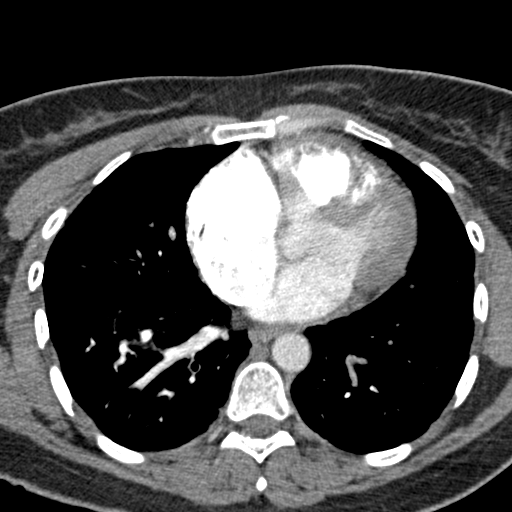
[im 64/127  lung]
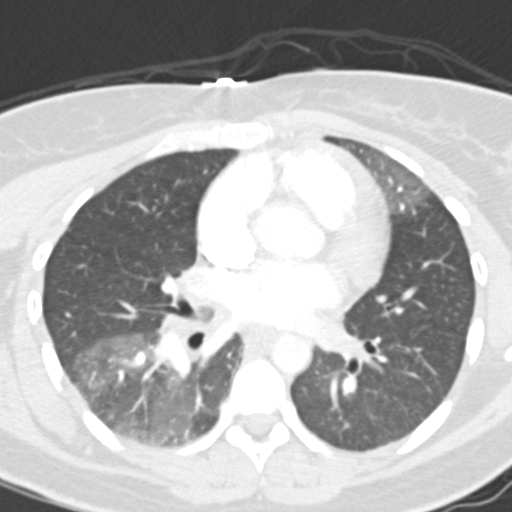
[im 73/127  soft-tissue]
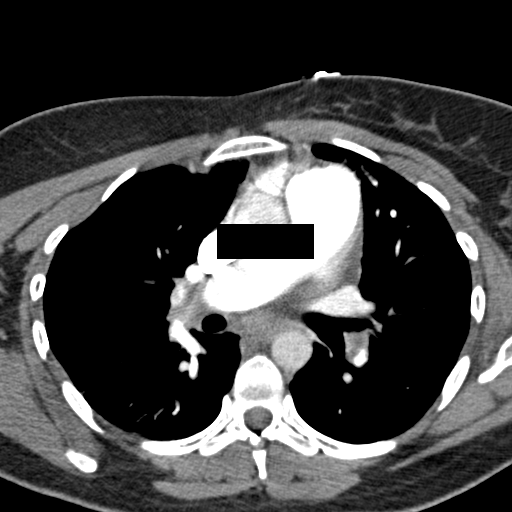
[im 82/127  lung]
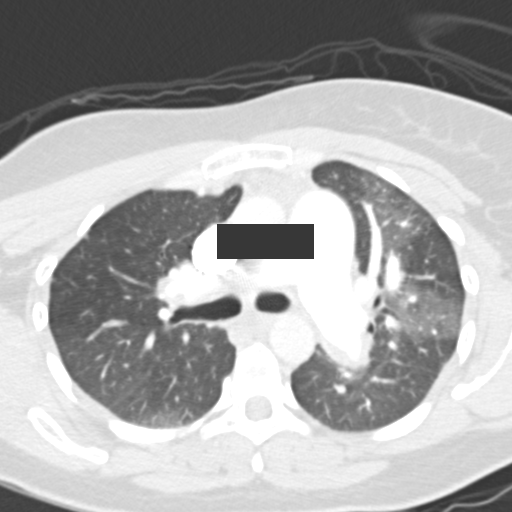
[im 91/127  soft-tissue]
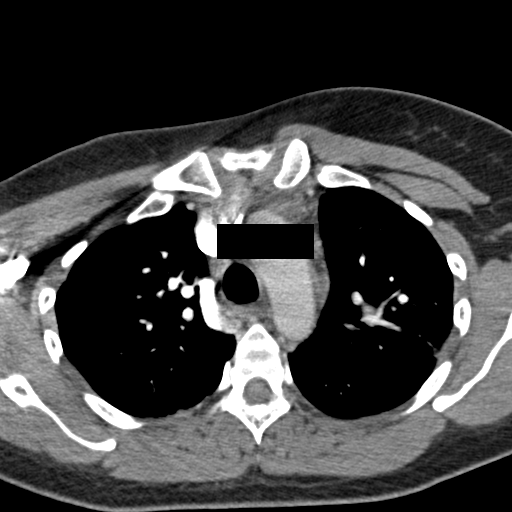
[im 100/127  lung]
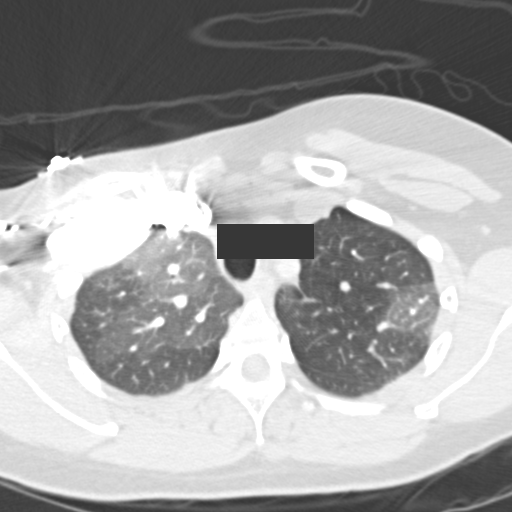
[im 109/127  soft-tissue]
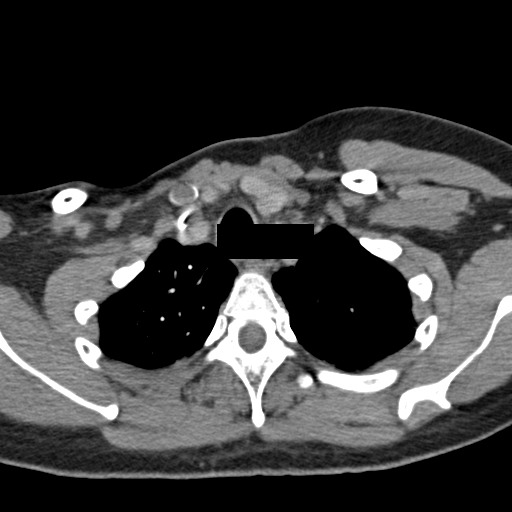
[im 118/127  lung]
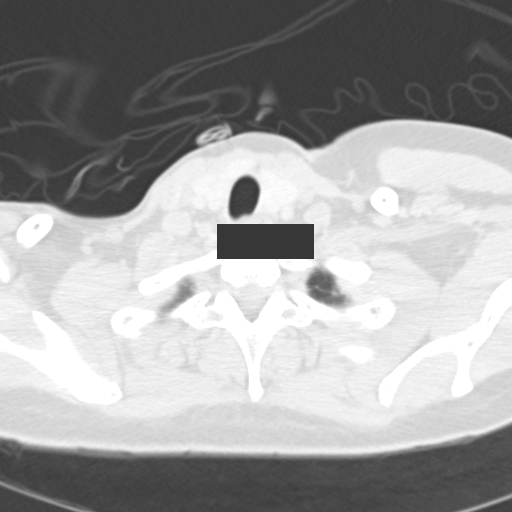

[Series 7: pulm embolism 2.0 st · axial · 0.49mm/px · z∈[-190,-172]mm · 2 of 29 slices shown (2 of 2)]
[im 10/29  lung]
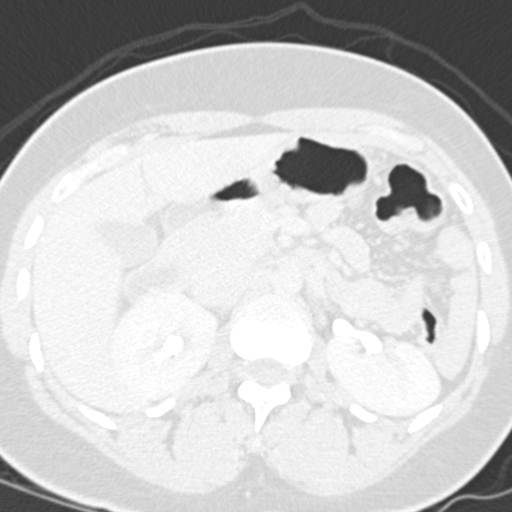
[im 19/29  lung]
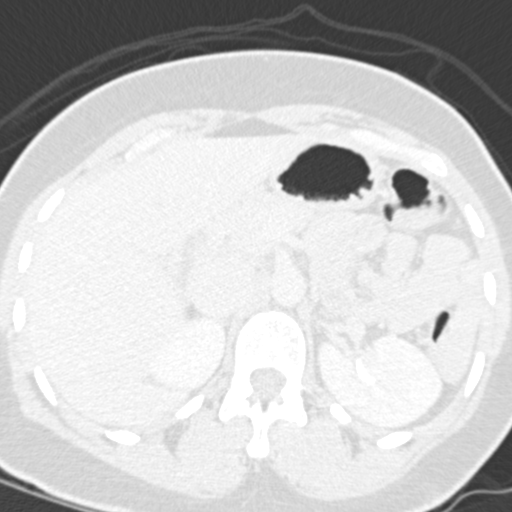

[Series 603: cor · coronal · 0.50mm/px · 3 of 88 slices shown]
[im 30/88  soft-tissue]
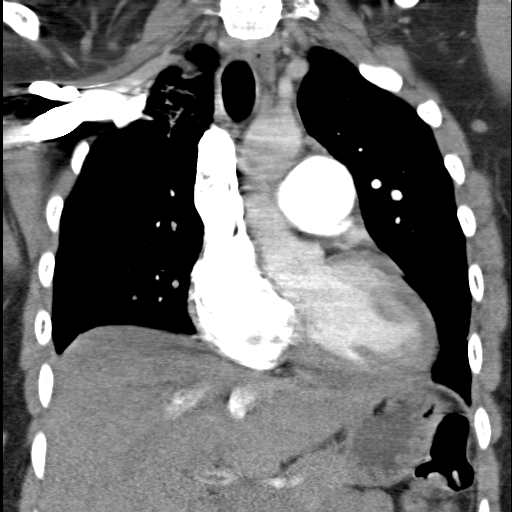
[im 39/88  soft-tissue]
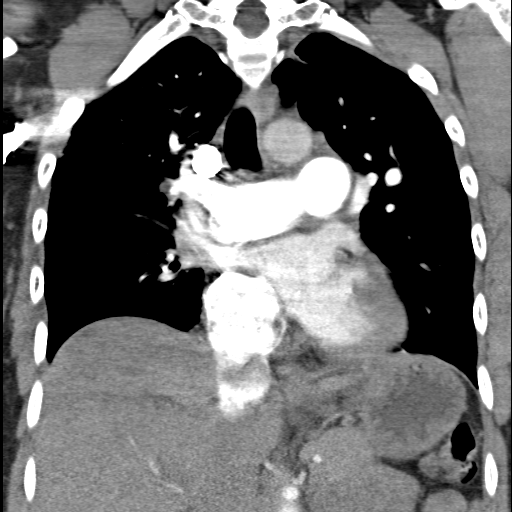
[im 49/88  soft-tissue]
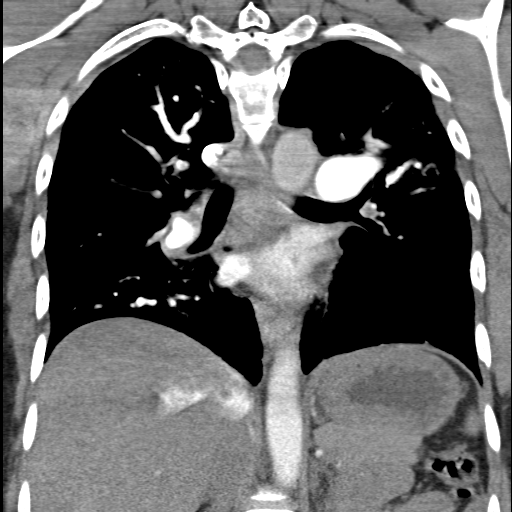

[18 of 46 positions shown; findings below may reference images not displayed]

FINDINGS: The pulmonary arteries are well opacified with contrast.  No central pulmonary emboli are demonstrated.  The main pulmonary artery is dilated, measuring up to 3.5 cm in diameter.  The left lower lobe pulmonary artery is narrowed just distal to the hilum with apparent circumferential wall thickening, suggesting remote pulmonary embolism with recanalization.  Just distal to this, there are ill defined filling defects within the left lower lobe pulmonary artery branches on images 56 through 67 of series 4.  There are similar ill defined filling defects in the right lower lobe pulmonary artery branches on images 66 through 71.  Although not the classic appearance for acute pulmonary embolism, these could reflect subacute non-occlusive emboli. 
The mediastinum has a normal appearance without enlarged lymph nodes.  There is no aortic aneurysm or mediastinal hematoma.  There is no pleural or pericardial effusion. 
On the lung windows, there is a geographic distribution of ground glass density with patchy components in both upper lobes, the lingula, and the superior segment of the right lower lobe.  This mosaic pattern can be seen as a sequela of chronic pulmonary thromboembolism.  There is no confluent airspace opacity or endobronchial lesion. 
Images through the upper abdomen demonstrate retrograde opacification of the IVC and hepatic veins suggesting the possibility of a degree of elevated right heart pressures.  No masses are demonstrated.
IMPRESSION: 1.  There are findings compatible with remote pulmonary thromboembolism with mosaic ground glass opacity throughout both lungs and mural thickening of the left lower lobe pulmonary artery compatible with recanalization.  I cannot exclude recurrent thromboembolism in the segmental branches of both lower lobe pulmonary arteries where there are ill defined filling defects.  These most likely represent subacute emboli.
2.  There is evidence of pulmonary arterial hypertension with retrograde filling of the IVC and hepatic veins which may indicate elevated right heart pressures.

## 2005-04-27 IMAGING — CT CT HEAD W/O CM
1 series · 16 of 30 positions shown, 20 images · IV contrast (agent unspecified)
Comparison: None.

CLINICAL DATA: Headaches and diplopia.
 HEAD CT WITHOUT CONTRAST:
TECHNIQUE: Contiguous axial images were obtained from the base of the skull through the vertex, according to standard protocol, without contrast.

[Series 2: brain w/o 4.8 h40s st · axial · non-contrast · 0.43mm/px · z∈[+1167,+1302]mm · 16 of 30 slices shown, 20 images]
[im 2/30  brain]
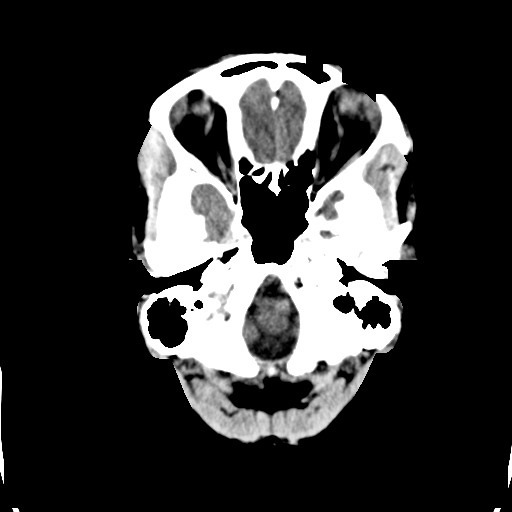
[im 2/30  bone]
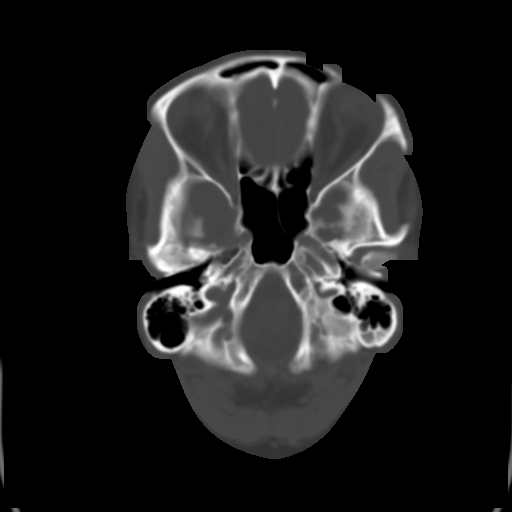
[im 4/30  brain]
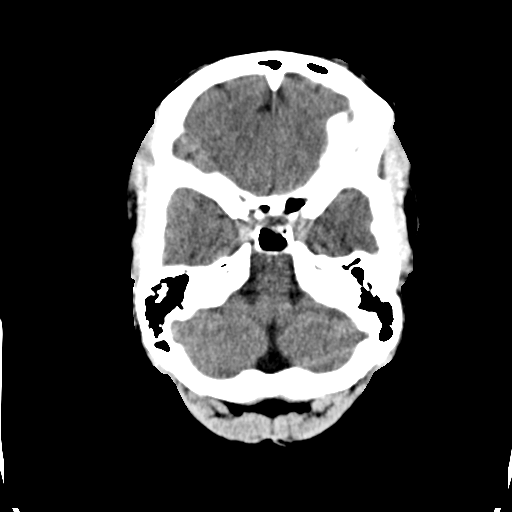
[im 6/30  brain]
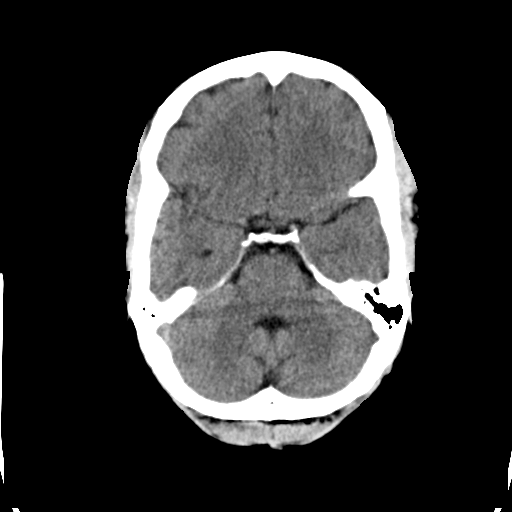
[im 8/30  brain]
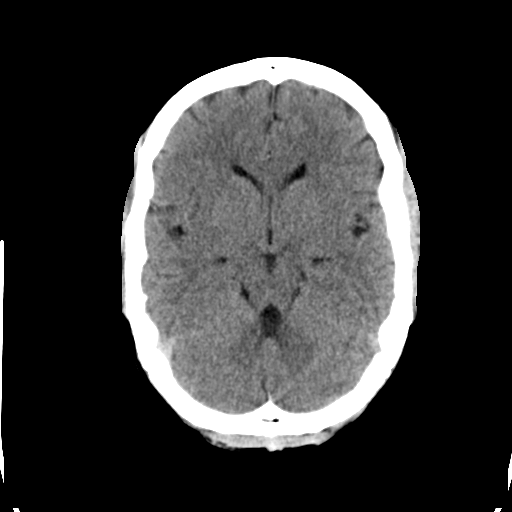
[im 9/30  brain]
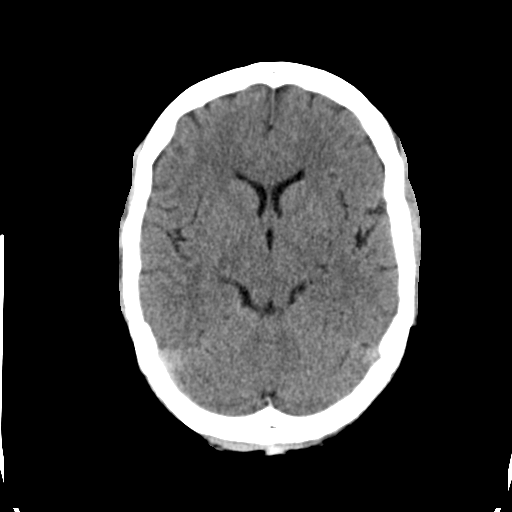
[im 9/30  bone]
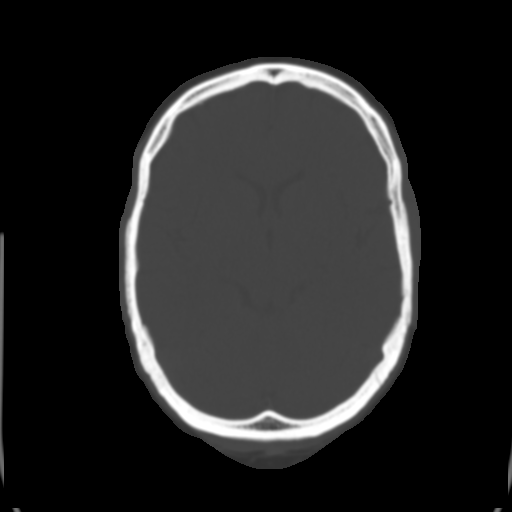
[im 11/30  brain]
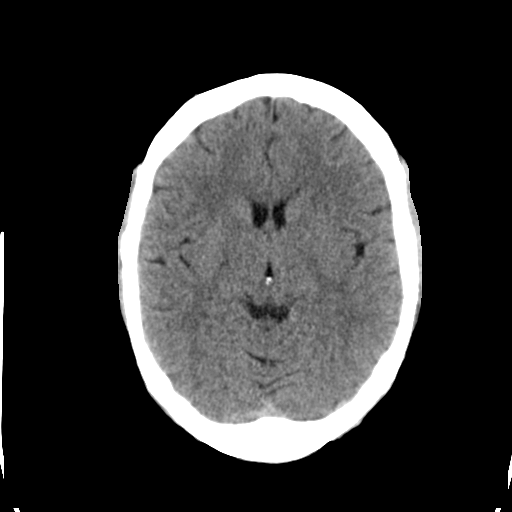
[im 13/30  brain]
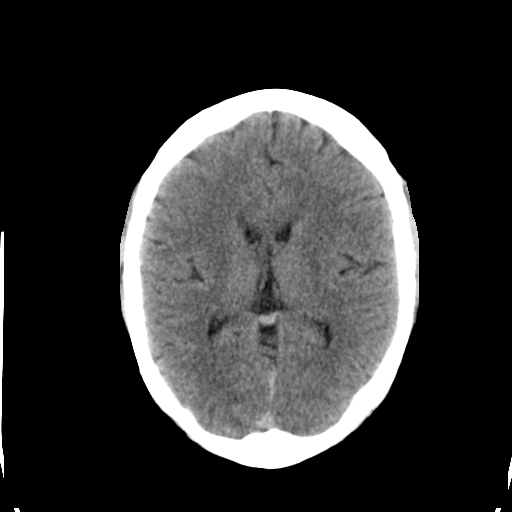
[im 15/30  brain]
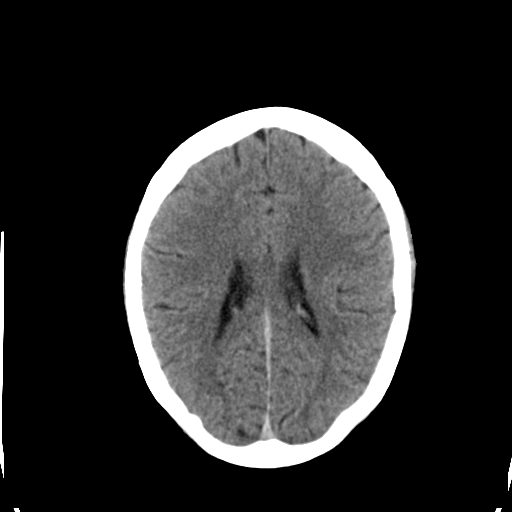
[im 16/30  brain]
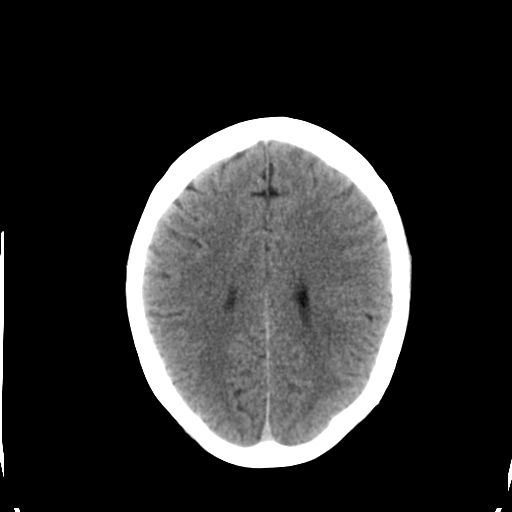
[im 16/30  bone]
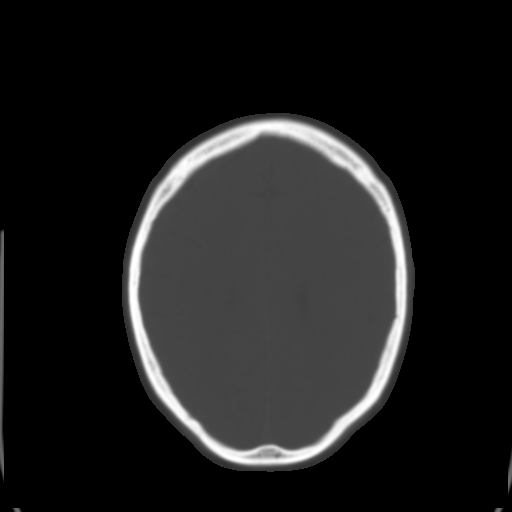
[im 18/30  brain]
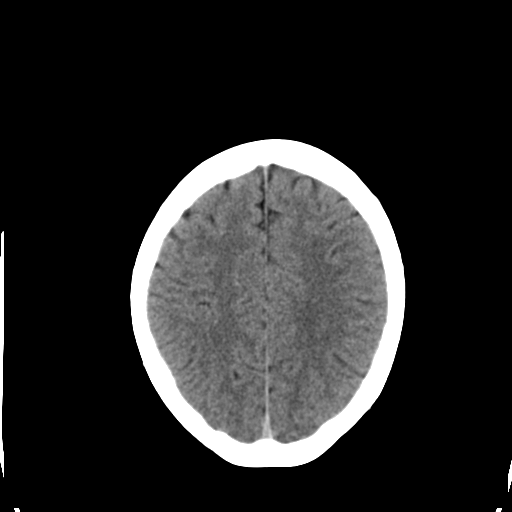
[im 20/30  brain]
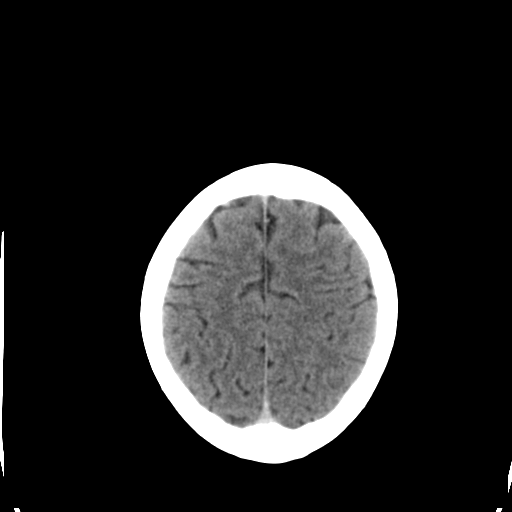
[im 22/30  brain]
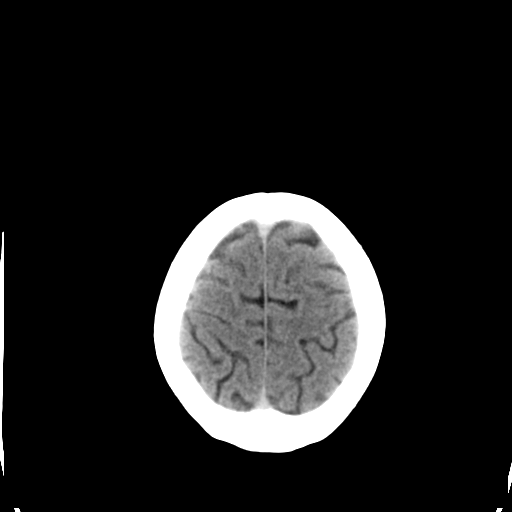
[im 23/30  brain]
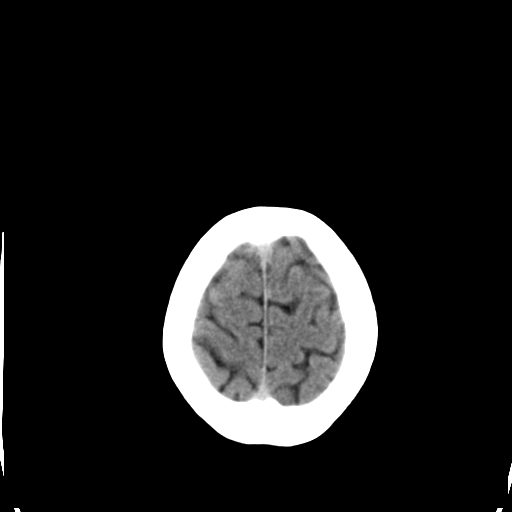
[im 23/30  bone]
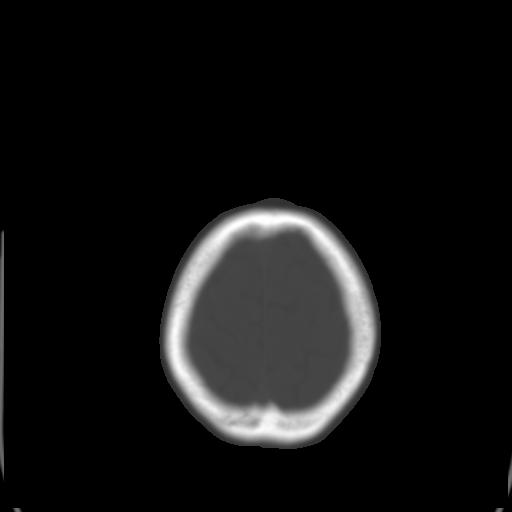
[im 25/30  brain]
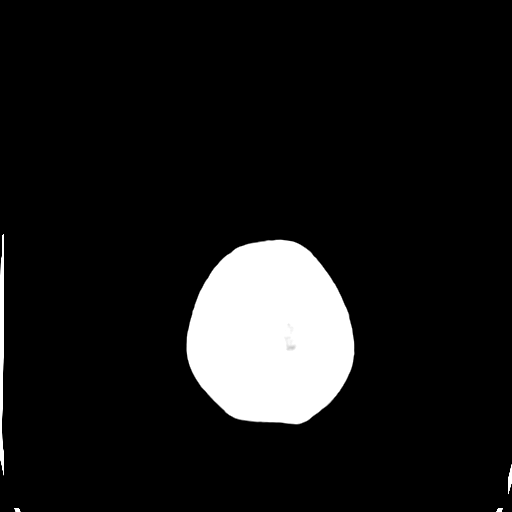
[im 27/30  brain]
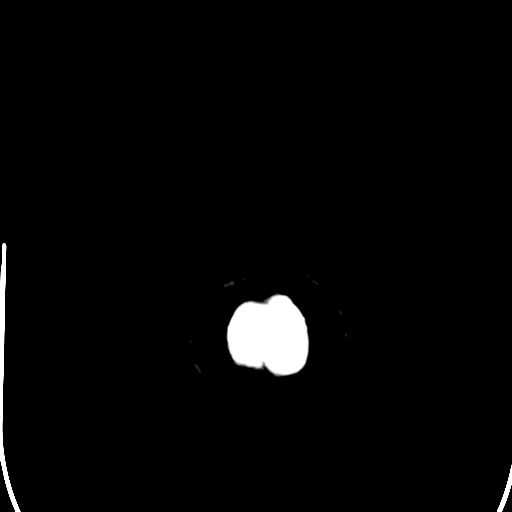
[im 29/30  brain]
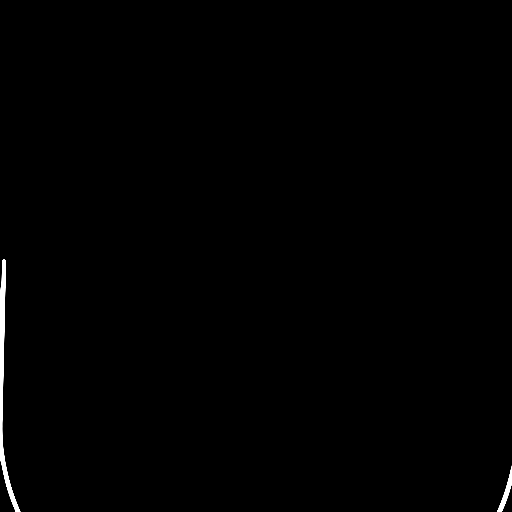

[16 of 30 positions shown; findings below may reference images not displayed]

FINDINGS: The patient received intravenous contrast for the chest CT done earlier today.  There is no evidence of acute intracranial hemorrhage, mass effect, or extraaxial fluid collection.  The ventricles and subarachnoid spaces appear appropriately sized for age.  The visualized paranasal sinuses are clear.  Calvarium is intact.
IMPRESSION: No acute intracranial findings.  Normal for technique.

## 2005-04-27 IMAGING — CR DG CHEST 1V PORT
1 series · 1 of 1 positions shown · non-contrast
Comparison: none

CLINICAL DATA: Chest pain, shortness of breath.  Asthma.  
PORTABLE CHEST - 1 VIEW AT [I4] HOURS:

[view not recorded]
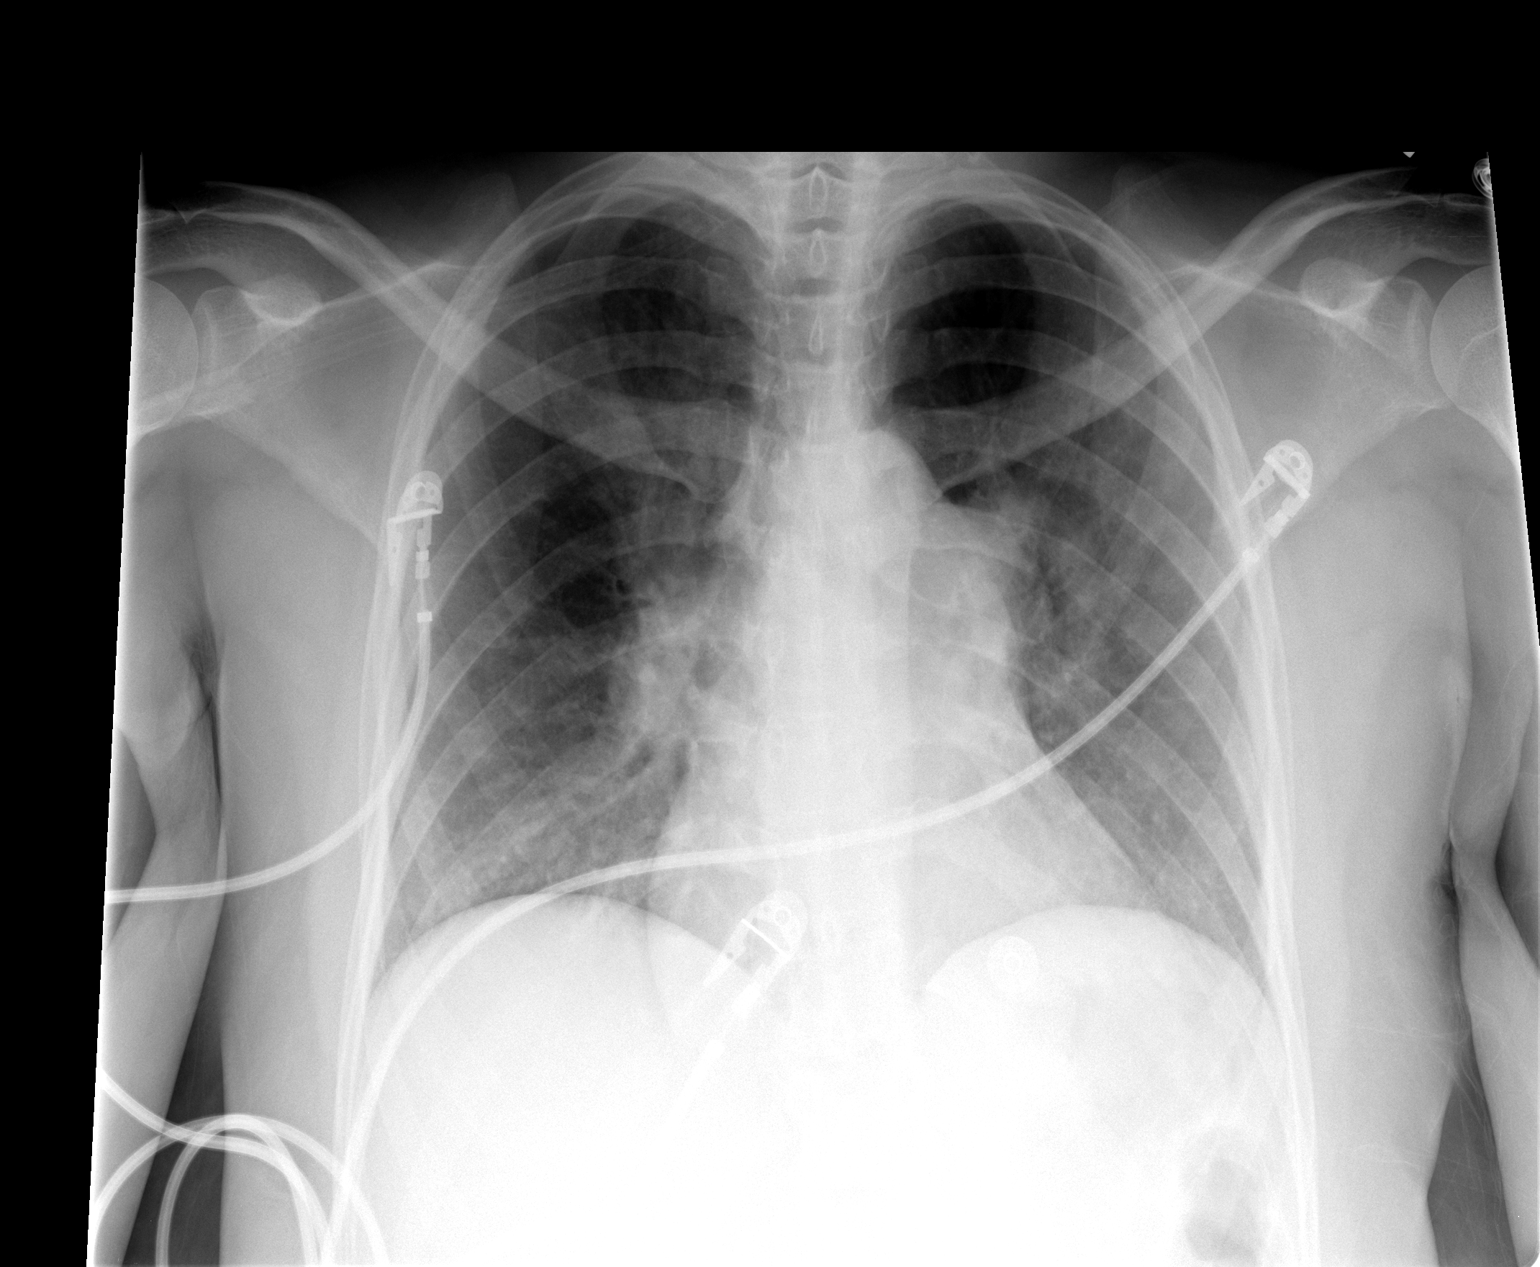

[1 of 1 positions shown; findings below may reference images not displayed]

FINDINGS: Enlargement of the main and central pulmonary arteries compatible with pulmonary arterial hypertension.  Hazy opacity of the lungs, mainly left mid to upper lung zone is compatible with pulmonary edema.  Peribronchial cuffing.
IMPRESSION: Findings compatible with CHF and mild edema.  PAH.

## 2005-04-29 ENCOUNTER — Encounter (INDEPENDENT_AMBULATORY_CARE_PROVIDER_SITE_OTHER): Payer: Self-pay | Admitting: Internal Medicine

## 2005-04-29 IMAGING — CT CT ANGIO CHEST
2 of 5 series · 17 of 30 positions shown · IV contrast (omnipaque)
Comparison: [DATE].

CLINICAL DATA: History of pulmonary embolism and right heart failure.  
 CT ANGIOGRAPHY OF CHEST:
TECHNIQUE: Multidetector CT imaging of the chest was performed during bolus injection of intravenous contrast.  Multiplanar CT angiographic image reconstructions were generated to evaluate the vascular anatomy.
 Contrast:  100 cc Omnipaque 300

[Series 3: pe · axial · 0.53mm/px · z∈[-221,+9]mm · 15 of 426 slices shown]
[im 29/426  lung]
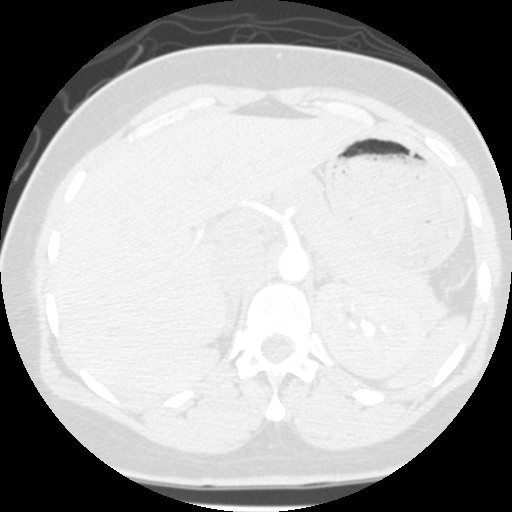
[im 57/426  mediastinal]
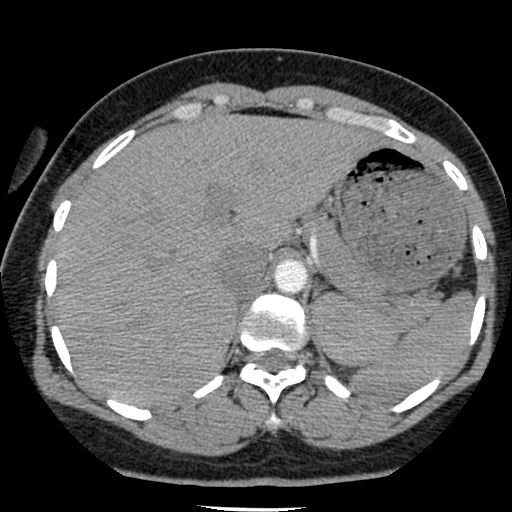
[im 86/426  lung]
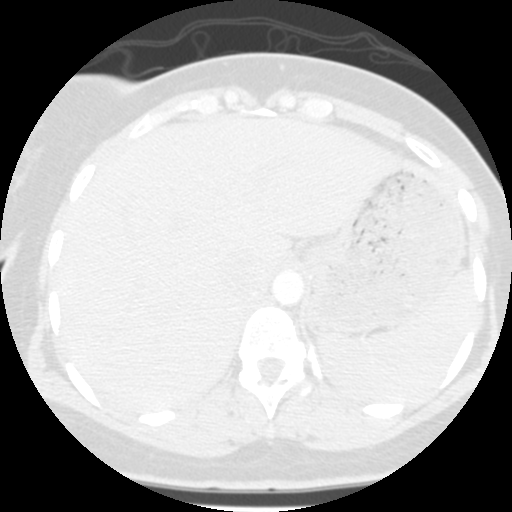
[im 114/426  mediastinal]
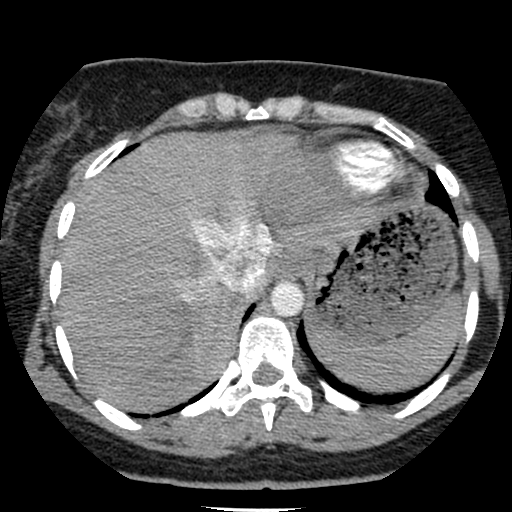
[im 142/426  lung]
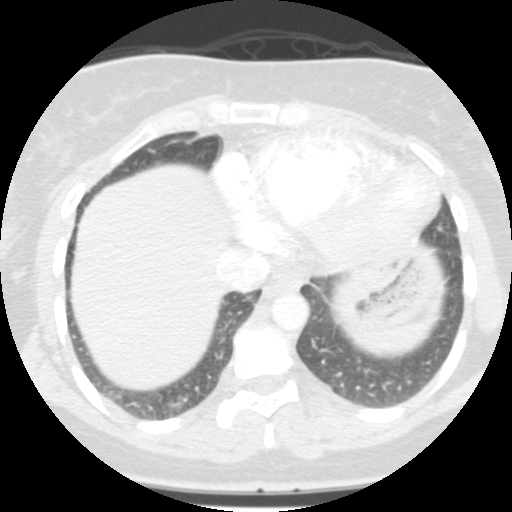
[im 171/426  mediastinal]
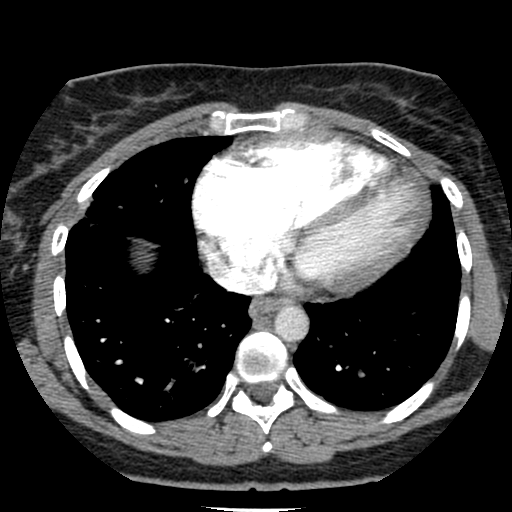
[im 199/426  lung]
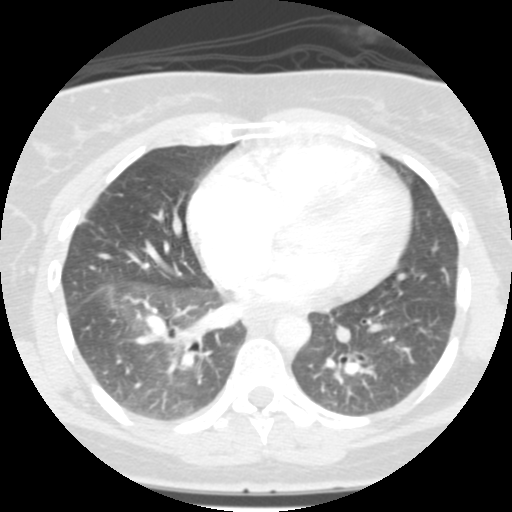
[im 208/426  mediastinal]
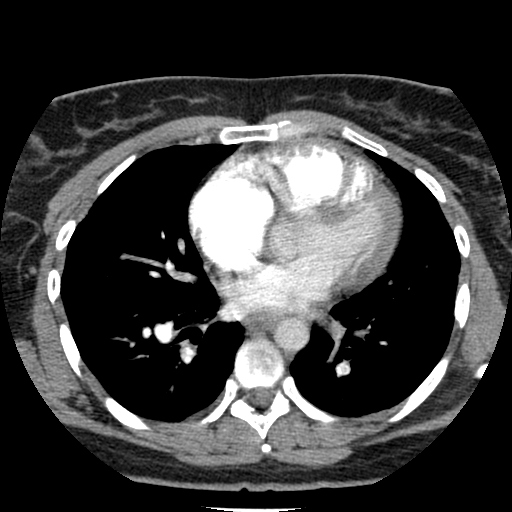
[im 227/426  lung]
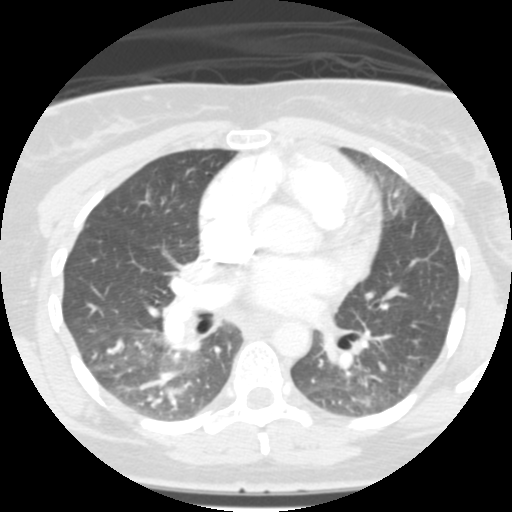
[im 256/426  mediastinal]
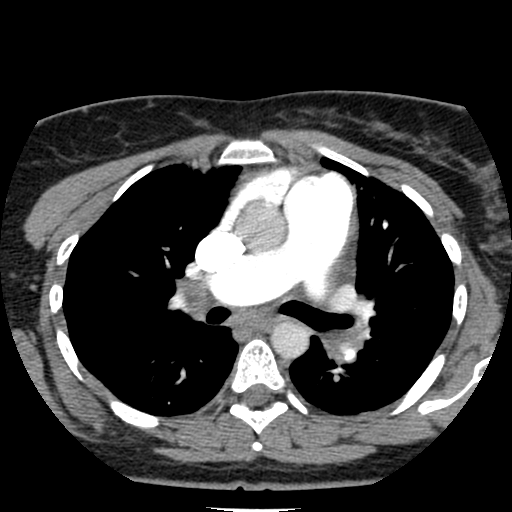
[im 284/426  lung]
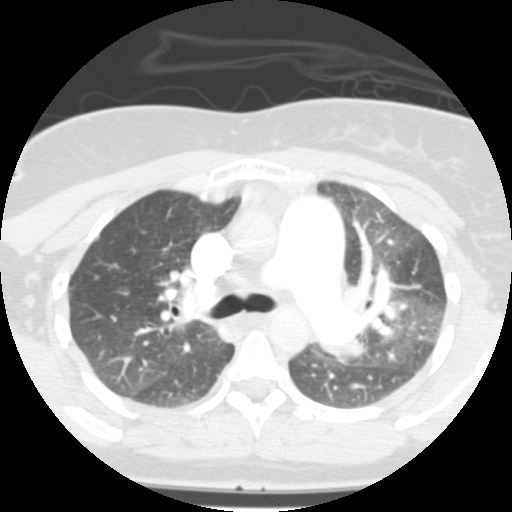
[im 312/426  mediastinal]
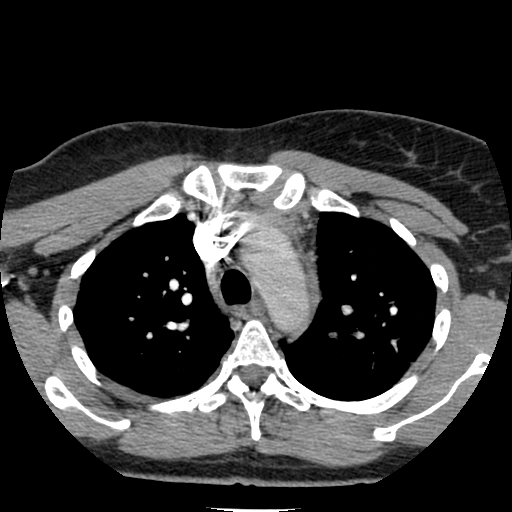
[im 341/426  lung]
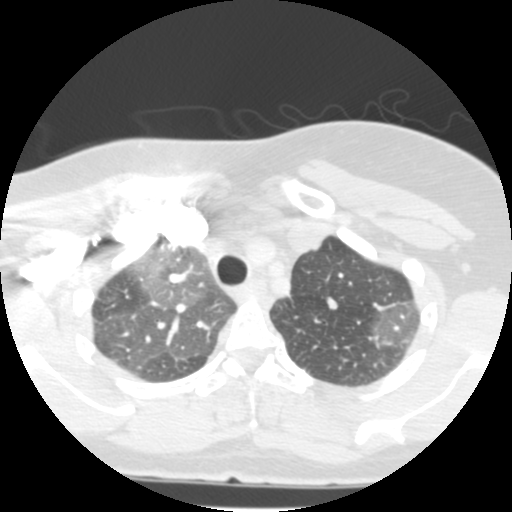
[im 369/426  mediastinal]
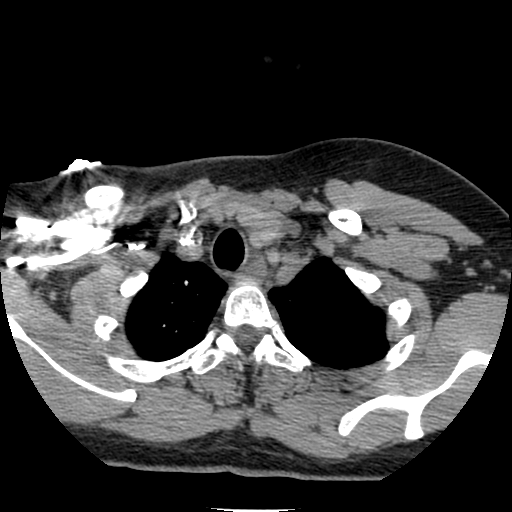
[im 397/426  lung]
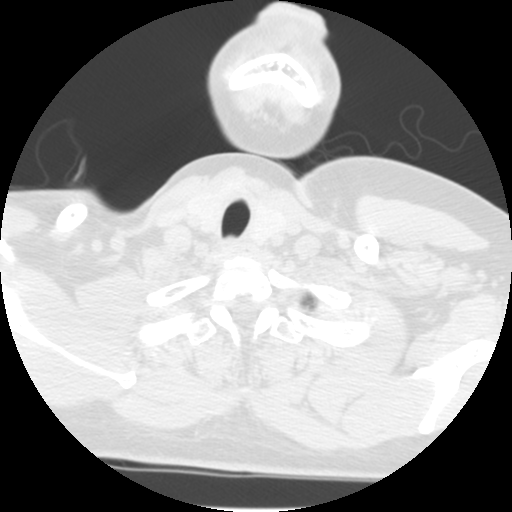

[Series 300: reformatted · sagittal · 0.47mm/px · 2 of 118 slices shown]
[im 40/118  lung]
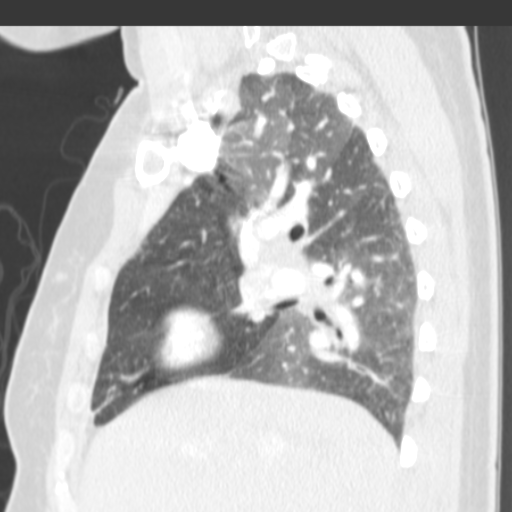
[im 79/118  lung]
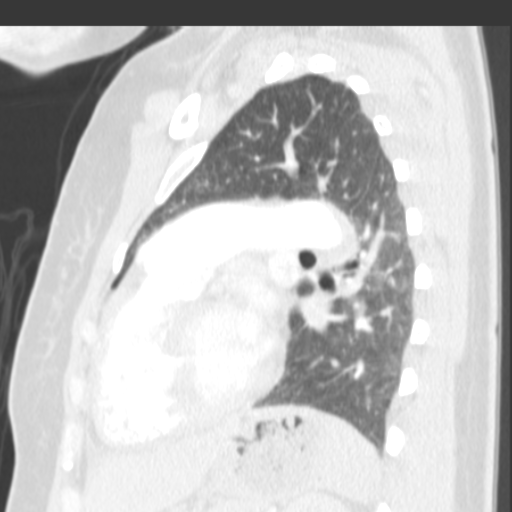

[17 of 30 positions shown; findings below may reference images not displayed]

FINDINGS: CTA demonstrates similar appearance as noted on recent study of recanalized pulmonary arteries with mural thrombus lining both right and left sided pulmonary arteries.  This continues to cause visible narrowing of the lower lobe pulmonary artery on the left.  Tiny filling defect again noted in a posterior lower lobe branch on the left.  This is not occlusive and is felt to represent stable probable chronic pulmonary embolism.  No changes are seen to suggest interval acute pulmonary embolism since the prior study.  
 Stable dilated central pulmonary arteries consistent with underlying pulmonary hypertension.  Lung windows again show a geographic and mosaic perfusion pattern to both lungs with multiple areas of ground-glass opacity present.  No significant airspace edema or development of pleural effusions.  No adenopathy.  The heart size remains stable since the prior study with no evidence of interval enlargement or pericardial fluid.
IMPRESSION: Stable appearance consistent with recanalized pulmonary arteries due to prior pulmonary embolism.  No interval change to suggest interval acute pulmonary embolism.  Stable mosaic pattern of perfusion of the lungs with no evidence of overt airspace edema.

## 2005-04-30 ENCOUNTER — Ambulatory Visit: Payer: Self-pay | Admitting: Pulmonary Disease

## 2005-05-06 ENCOUNTER — Ambulatory Visit: Payer: Self-pay | Admitting: Internal Medicine

## 2005-05-11 ENCOUNTER — Ambulatory Visit: Payer: Self-pay | Admitting: Hospitalist

## 2005-05-17 ENCOUNTER — Ambulatory Visit: Payer: Self-pay | Admitting: Internal Medicine

## 2005-05-23 ENCOUNTER — Emergency Department (HOSPITAL_COMMUNITY): Admission: EM | Admit: 2005-05-23 | Discharge: 2005-05-23 | Payer: Self-pay | Admitting: *Deleted

## 2005-05-24 ENCOUNTER — Ambulatory Visit: Payer: Self-pay | Admitting: Internal Medicine

## 2005-05-25 ENCOUNTER — Ambulatory Visit: Payer: Self-pay | Admitting: Pulmonary Disease

## 2005-05-26 ENCOUNTER — Ambulatory Visit: Payer: Self-pay | Admitting: Internal Medicine

## 2005-06-07 ENCOUNTER — Ambulatory Visit: Payer: Self-pay | Admitting: Internal Medicine

## 2005-06-18 ENCOUNTER — Ambulatory Visit: Payer: Self-pay | Admitting: Internal Medicine

## 2005-07-12 ENCOUNTER — Ambulatory Visit: Payer: Self-pay | Admitting: Internal Medicine

## 2005-07-20 ENCOUNTER — Ambulatory Visit: Payer: Self-pay | Admitting: Pulmonary Disease

## 2005-08-02 HISTORY — PX: OTHER SURGICAL HISTORY: SHX169

## 2005-08-09 ENCOUNTER — Ambulatory Visit: Payer: Self-pay | Admitting: Internal Medicine

## 2005-08-11 ENCOUNTER — Ambulatory Visit: Payer: Self-pay | Admitting: Internal Medicine

## 2005-08-17 ENCOUNTER — Ambulatory Visit: Payer: Self-pay | Admitting: Pulmonary Disease

## 2005-09-06 ENCOUNTER — Ambulatory Visit: Payer: Self-pay | Admitting: Internal Medicine

## 2005-10-04 ENCOUNTER — Ambulatory Visit: Payer: Self-pay | Admitting: Hospitalist

## 2005-10-06 ENCOUNTER — Ambulatory Visit: Payer: Self-pay | Admitting: Pulmonary Disease

## 2005-10-18 ENCOUNTER — Ambulatory Visit: Payer: Self-pay | Admitting: Internal Medicine

## 2005-11-08 ENCOUNTER — Ambulatory Visit: Payer: Self-pay | Admitting: Internal Medicine

## 2005-11-17 ENCOUNTER — Ambulatory Visit: Payer: Self-pay | Admitting: Pulmonary Disease

## 2005-11-22 ENCOUNTER — Ambulatory Visit: Payer: Self-pay | Admitting: Internal Medicine

## 2005-11-29 ENCOUNTER — Ambulatory Visit: Payer: Self-pay | Admitting: Hospitalist

## 2005-11-30 HISTORY — PX: CARDIAC CATHETERIZATION: SHX172

## 2005-12-01 ENCOUNTER — Ambulatory Visit: Payer: Self-pay

## 2005-12-14 ENCOUNTER — Ambulatory Visit: Payer: Self-pay | Admitting: Internal Medicine

## 2005-12-20 ENCOUNTER — Inpatient Hospital Stay (HOSPITAL_BASED_OUTPATIENT_CLINIC_OR_DEPARTMENT_OTHER): Admission: RE | Admit: 2005-12-20 | Discharge: 2005-12-20 | Payer: Self-pay | Admitting: Internal Medicine

## 2005-12-20 ENCOUNTER — Ambulatory Visit: Payer: Self-pay | Admitting: Internal Medicine

## 2006-01-10 ENCOUNTER — Ambulatory Visit: Payer: Self-pay | Admitting: Internal Medicine

## 2006-01-17 ENCOUNTER — Inpatient Hospital Stay (HOSPITAL_COMMUNITY): Admission: EM | Admit: 2006-01-17 | Discharge: 2006-01-28 | Payer: Self-pay | Admitting: Emergency Medicine

## 2006-01-17 ENCOUNTER — Ambulatory Visit: Payer: Self-pay | Admitting: Internal Medicine

## 2006-01-17 ENCOUNTER — Ambulatory Visit: Payer: Self-pay | Admitting: Pulmonary Disease

## 2006-01-17 IMAGING — CT CT ANGIO CHEST
1 of 6 series · 10 of 30 positions shown · IV contrast (120 ML OMNI 300)
Comparison: Today?s plain film and CT of the chest [DATE] and [DATE].

CLINICAL DATA: Left sided chest pain.  Shortness of breath.  History of pulmonary embolism 2 years ago. 
CT ANGIOGRAPHY OF CHEST:
TECHNIQUE: Multidetector CT imaging of the chest was performed during bolus injection of intravenous contrast.  Multiplanar CT angiographic image reconstructions were generated to evaluate the vascular anatomy.
Contrast:  120 cc Omnipaque 300

[Series 4: pe · axial · 0.58mm/px · z∈[-272,-61]mm · 10 of 213 slices shown]
[im 22/213  lung]
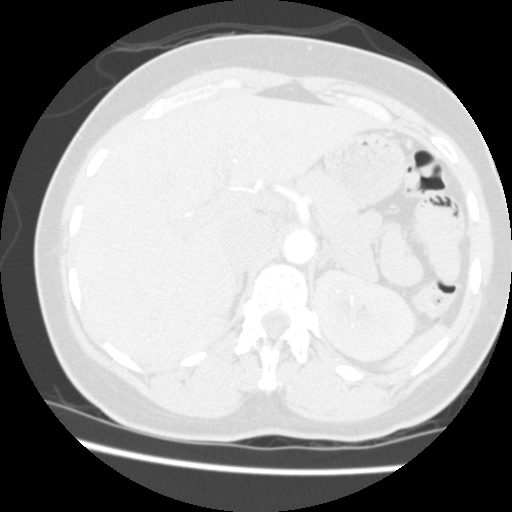
[im 43/213  mediastinal]
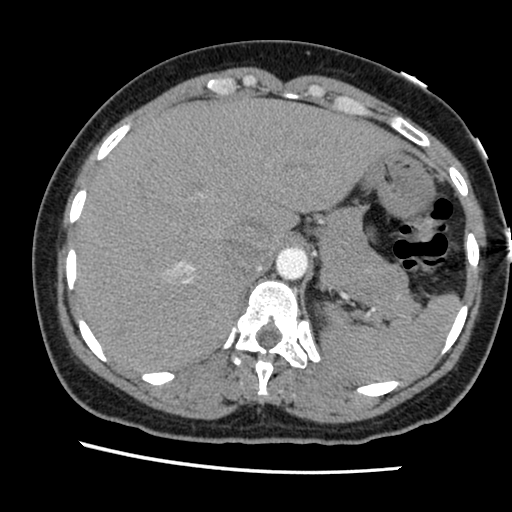
[im 64/213  lung]
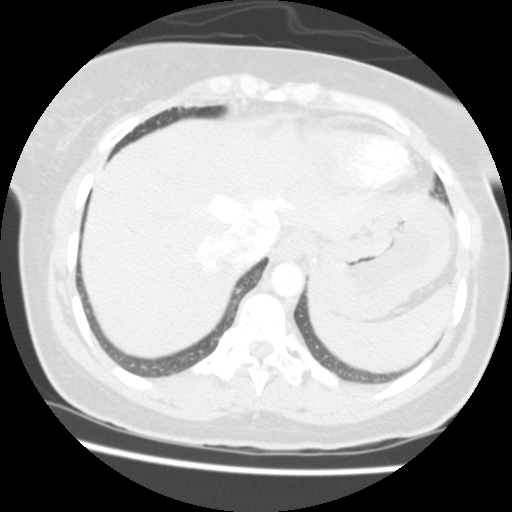
[im 85/213  mediastinal]
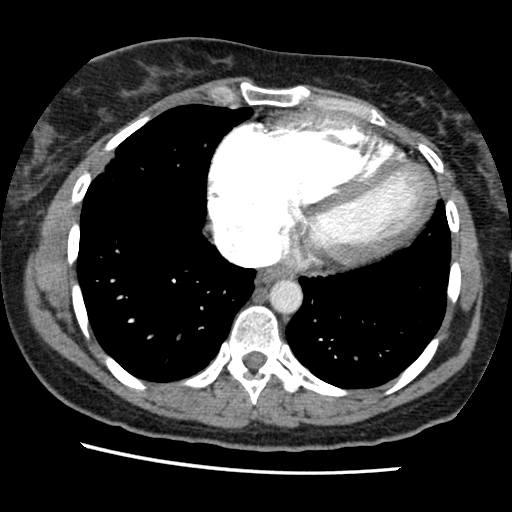
[im 99/213  lung]
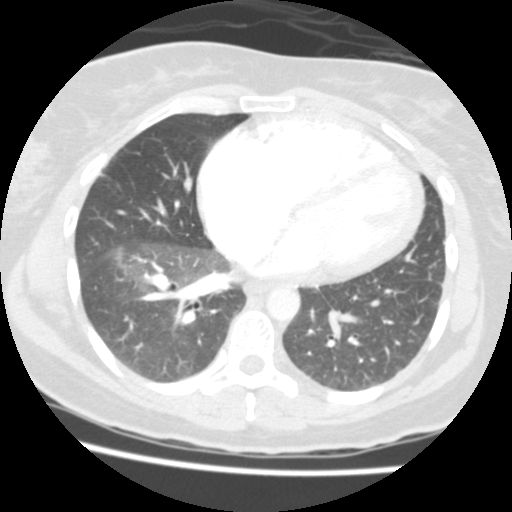
[im 107/213  mediastinal]
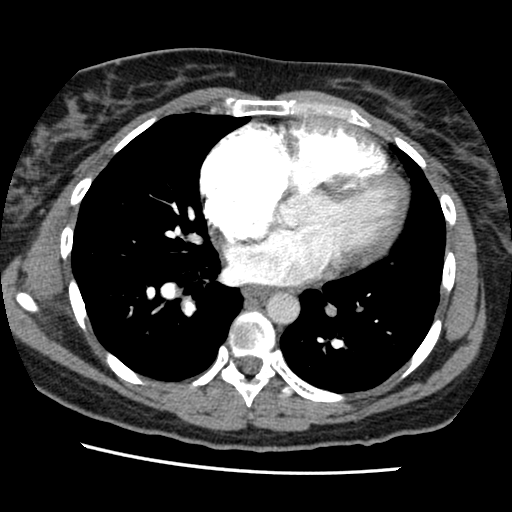
[im 128/213  lung]
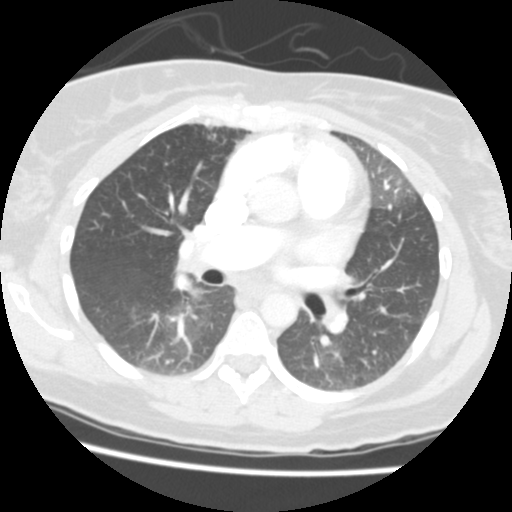
[im 149/213  mediastinal]
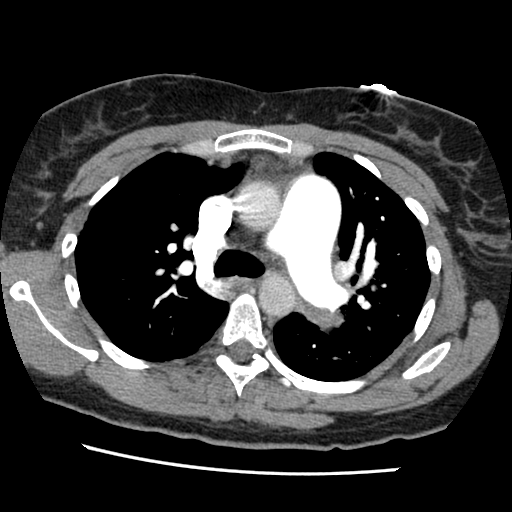
[im 170/213  lung]
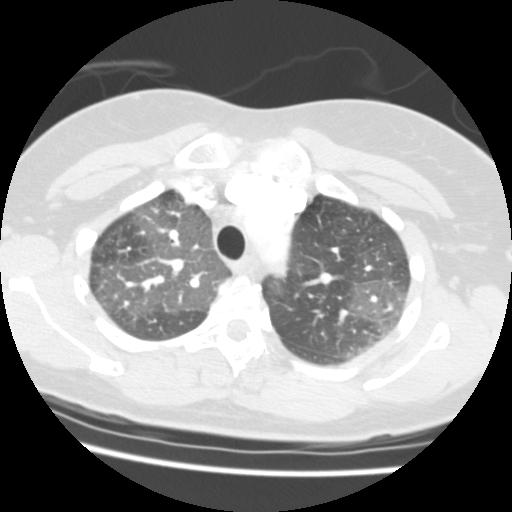
[im 191/213  mediastinal]
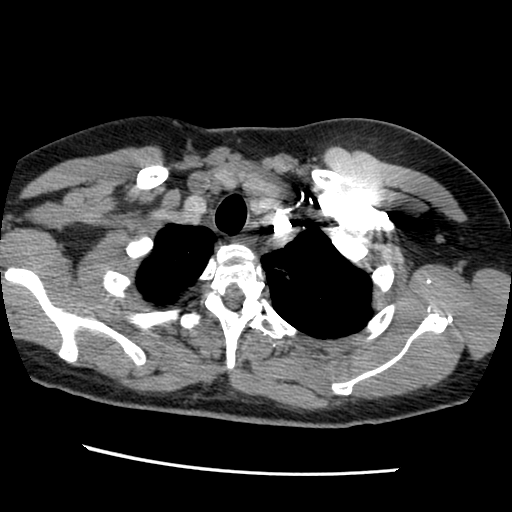

[10 of 30 positions shown; findings below may reference images not displayed]

FINDINGS: Lung windows demonstrate scarring or atelectasis of the right hemidiaphragm on image 68.  
Suggestion of subtle micronodulariy on image 49 of the left lower lobe suspicious for atypical infection.  Similar findings seen more superiorly on the left on image 39.  
There is mosaic perfusion within bilateral lungs, greater on the left than right.  The areas of increased density demonstrate increased vascular size which suggests either small airways disease or vascular causes.  
Soft tissue windows:  Quality of this examination for pulmonary embolism is good to excellent.  There is persistent, slightly increased soft tissue thickening along the left pulmonary arteryand left descending pulmonary artery branches.  This slight increase is seen on image 36 where soft tissue thickening is increased compared to the prior.  
  There is a filling defect within a segmental or subsegmental left lower lobe branch on image 51 of series 5.  This appears similar to image 49 of series 4 previously and likely therefore is related to chronic pulmonary embolism.  There is also suboptimal filling of more lateral segmental branches on image 45 of series 5.  There is no large acute embolism identified.  
  Reflux of contrast in the azygous and hemiazygous system is likely related to increased right heart pressures.  The aorta is poorly opacified, but normal in caliber.  The heart is mildly enlarged.  The right sided chambers including the right atrium are enlarged.  There is contrast reflux into the intrahepatic inferior vena cava suggesting elevated right heart pressures.  Mild septal flattening also suggests elevated right heart strain.  Pulmonary artery enlargement consistent with chronic pulmonary arterial hypertension.  Right hila and nodal tissue is at the upper limits of normal.  Limited images of the upper abdomen demonstrates no additional finding.
IMPRESSION: 1.  Further increase in primarily left sided pulmonary arterial wall thickening and chronic filling defects.  Findings likely represent a component of progressive recurrent pulmonary emboli since [DATE].  No definite new acute pulmonary embolism is seen. 
2.  Areas of mosaic perfusion could relate to chronic pulmonary embolism or air trapping.  These entities could be differentiated by expiratory imaging if desired. 
3.  Evidence of right heart strain, pulmonary arterial hypertension, and elevated right heart pressures. 
4.  Subtle micronodularity in the left lung could relate to an atypical infectious process.   also correlate with any possibly history of sarcoidosis which could also have this appearance.

## 2006-01-17 IMAGING — CR DG CHEST 1V PORT
1 series · 1 of 1 positions shown · non-contrast
Comparison: CT?s of [DATE] and [DATE].   Plain film of [DATE] also reviewed.

CLINICAL DATA: Chest pain.   History of asthma and pulmonary embolism. 
 PORTABLE CHEST ? 1 VIEW:

[view not recorded]
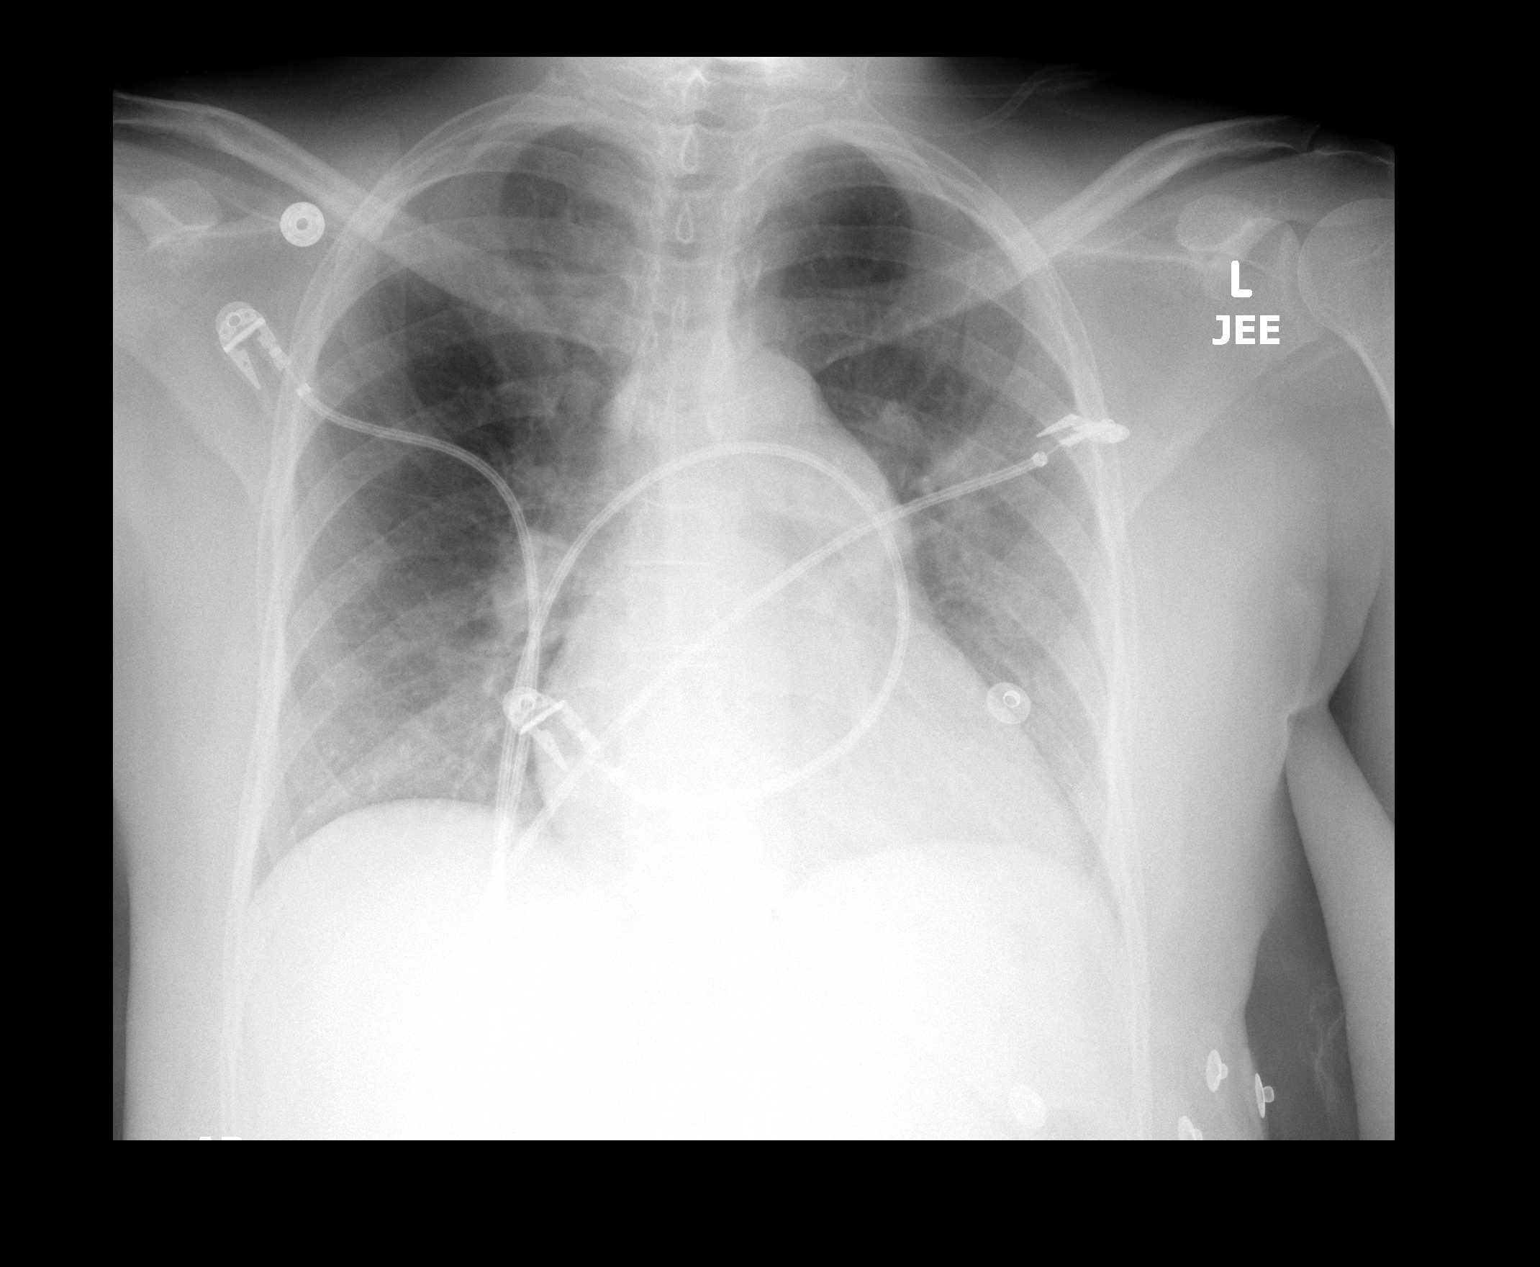

[1 of 1 positions shown; findings below may reference images not displayed]

FINDINGS: Midline trachea.  Heart is mildly enlarged.  Prominence of pulmonary outflow tract, likely related to pulmonary arterial hypertension.  There is hazy opacity of the left mid lung zone.  There is no significant pleural fluid.  No pneumothorax.
IMPRESSION: 1.  Hazy opacity of the left midlung zone could relate to an area of infection or even infarction, given the history of pulmonary emboli. 
 2.  Cardiomegaly and pulmonary arterial hypertension.

## 2006-01-18 IMAGING — XA IR US GUIDE VASC ACCESS RIGHT
1 series · 7 of 7 positions shown · non-contrast
Comparison: none

CLINICAL DATA: Antiphospholipid antibodies.  History of DVT.   Pulmonary thromboembolism, which has worsened. 
 ULTRASOUND GUIDANCE FOR VASCULAR ACCESS ? [DATE] AT [91] HOURS:
 IVC VENOGRAM ? [DATE] AT [91] HOURS:
 Procedure: The right neck was prepped and draped in a sterile fashion, and lidocaine was utilized for local anesthesia.  Under sonographic guidance, a micropuncture needle was inserted into the right IJ vein, which was noted to be patent.  It was removed over an 0.018 wire, which was upsized to an 0.035 MEDJAHED.  A 6-French multi-sidehole sheath was advanced over the Bentson wire into the IVC.  Venography was performed.

[Series 1: run · 7 of 7 slices shown]
[im 1/7]
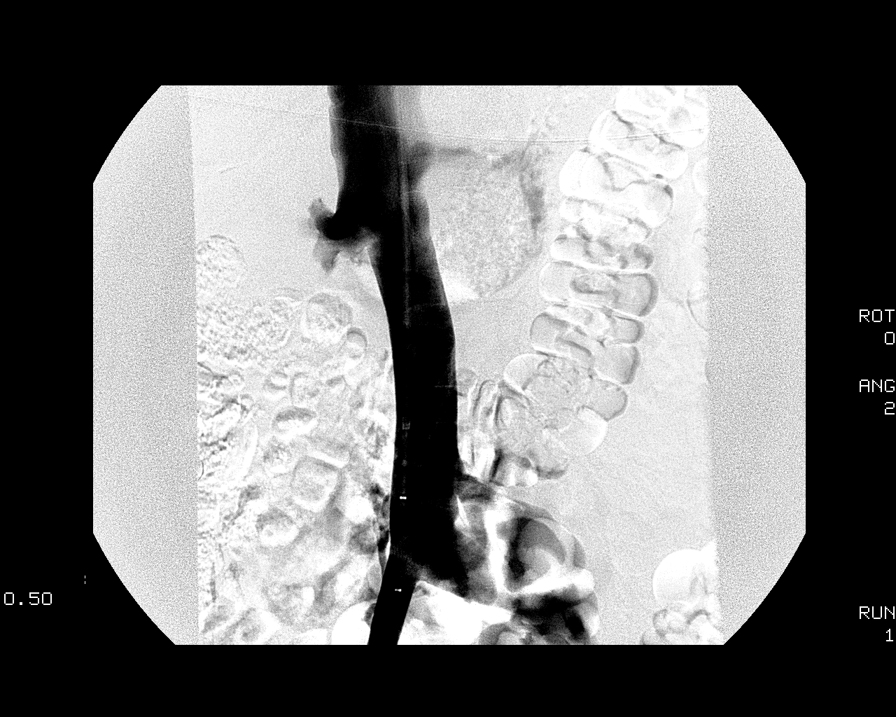
[im 2/7]
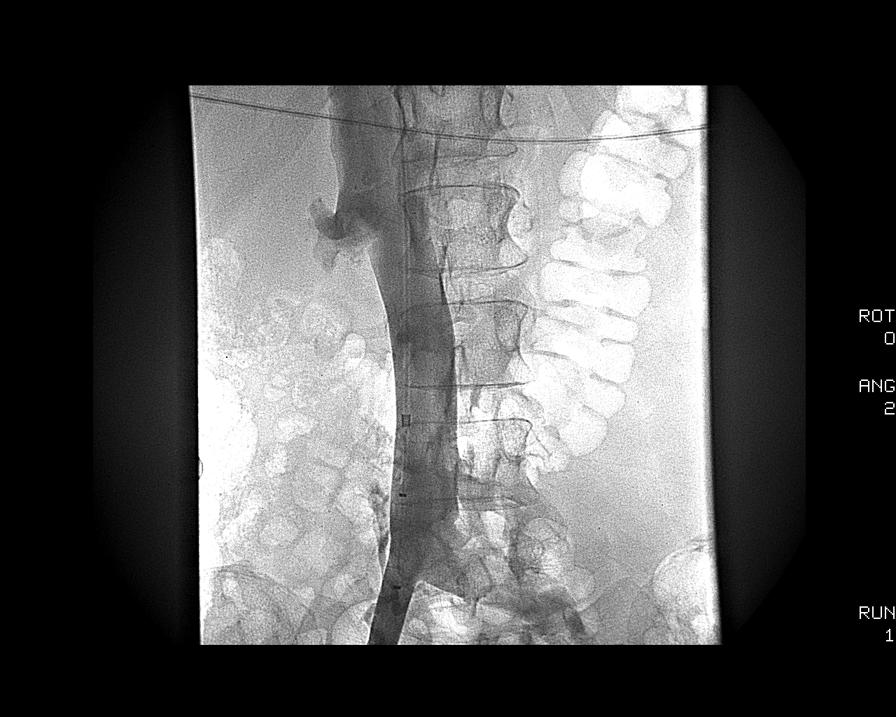
[im 3/7]
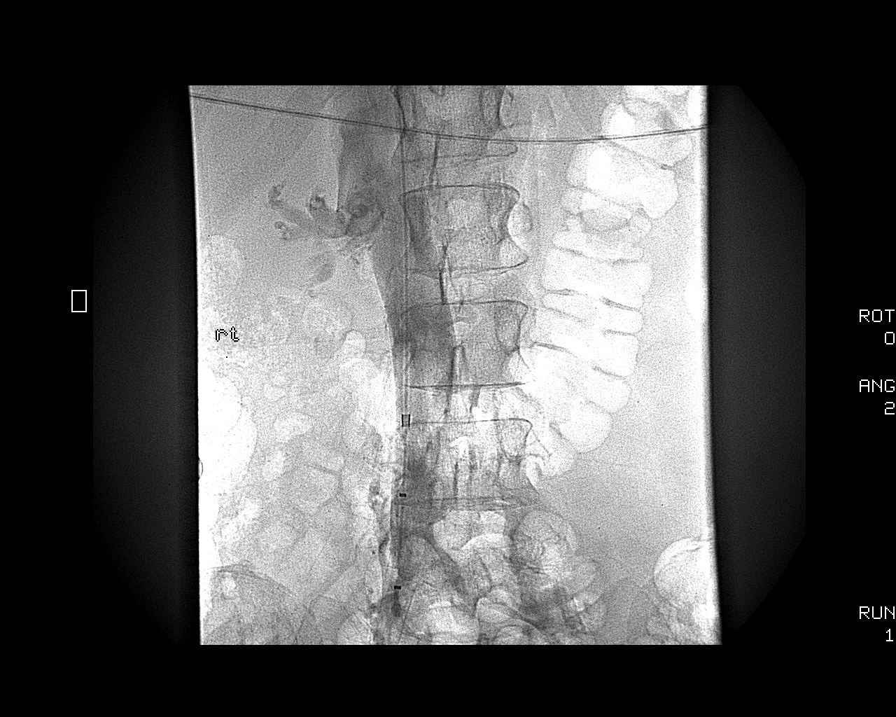
[im 4/7]
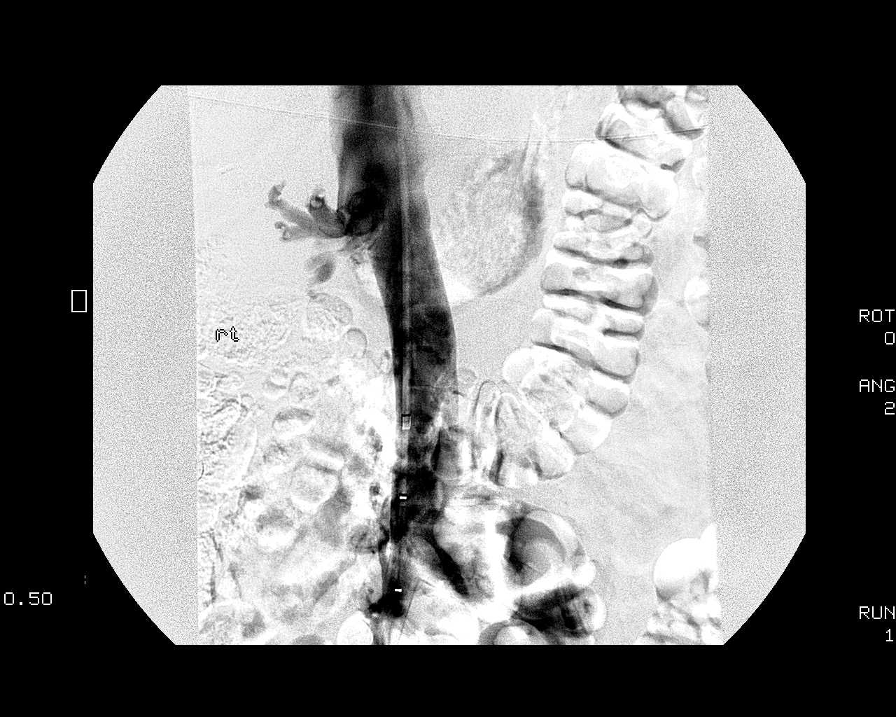
[im 5/7]
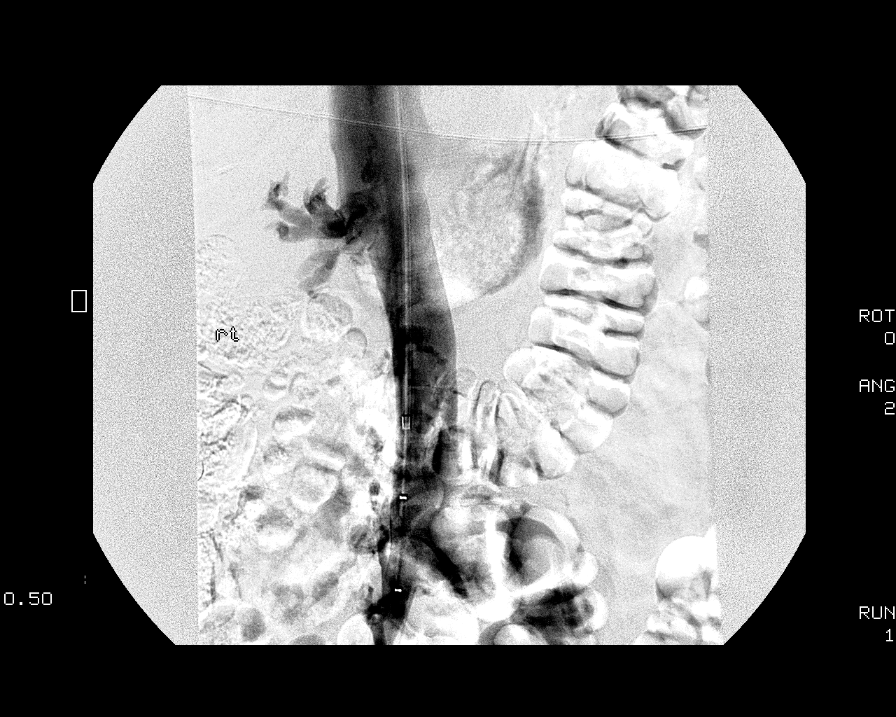
[im 6/7]
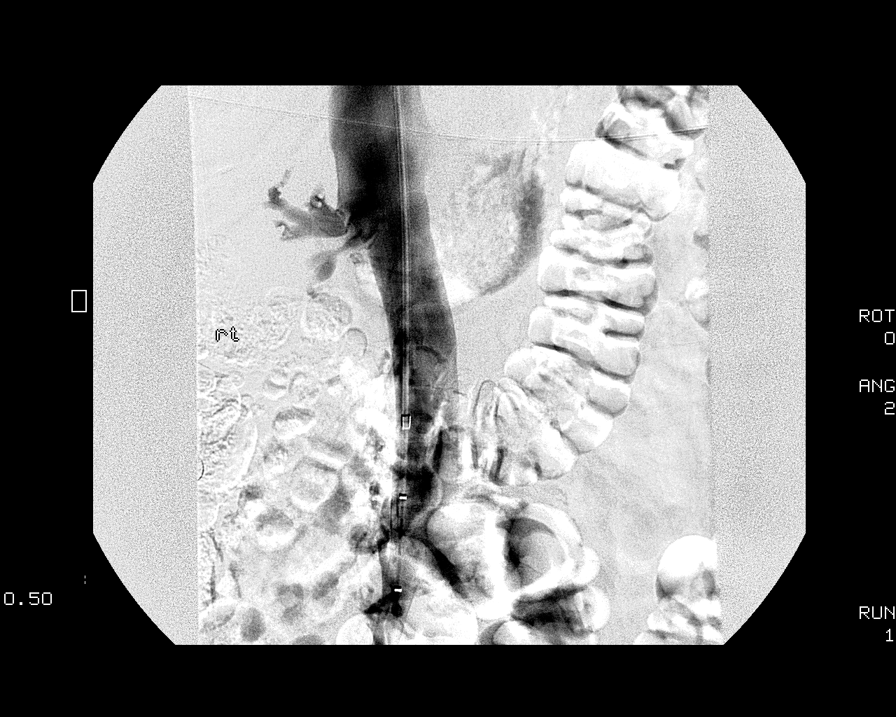
[im 7/7]
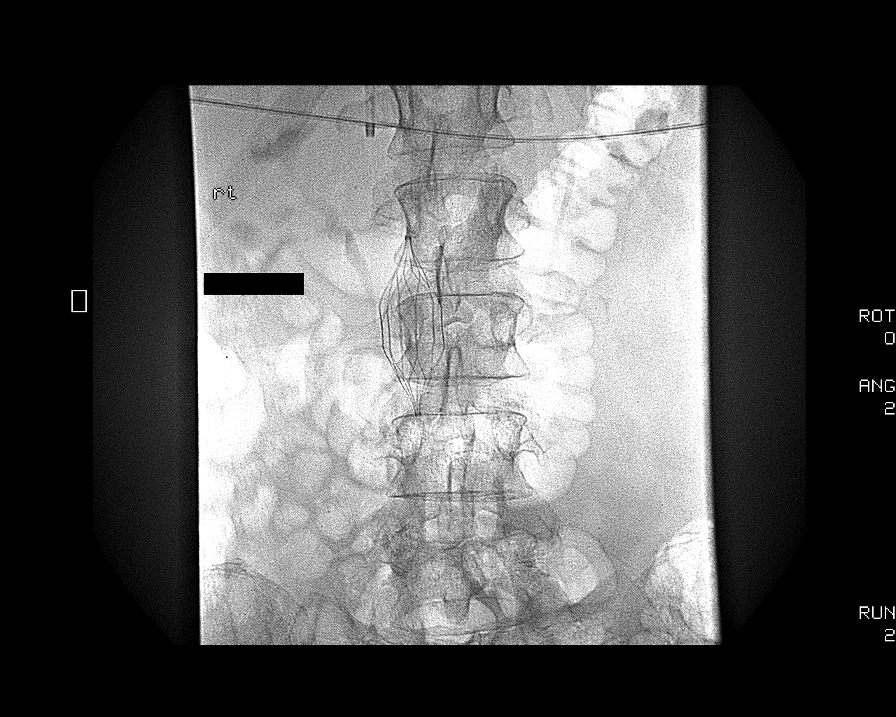

[7 of 7 positions shown; findings below may reference images not displayed]

FINDINGS: The IVC is patent.  There is no venous anomaly.  Renal vein inflow is at the mid L2.
IMPRESSION: IVC venogram in preparation for filter placement.
 IVC FILTER PLACEMENT ? [DATE] AT [91] HOURS:
 Procedure:  The Trapeze IVC filter was advanced through the 6-French sheath and deployed in the infrarenal IVC without complication.  The sheath was removed.  Hemostasis was achieved with direct pressure.
FINDINGS: A Trapeze permanent infrarenal IVC filter has been placed with the tip at the mid L2 level.
IMPRESSION: Successful permanent infrarenal IVC filter placement.

## 2006-01-19 IMAGING — CT CT ABDOMEN W/O CM
2 of 4 series · 14 of 32 positions shown, 19 images · IV contrast (agent unspecified)
Comparison: none

CLINICAL DATA: Status-post cardiac cath, now with pain. 
ABDOMEN CT WITHOUT CONTRAST:
TECHNIQUE: Multidetector CT imaging of the abdomen was performed following the standard protocol without IV contrast.
TECHNIQUE: Multidetector CT imaging of the pelvis was performed following the standard protocol without IV contrast.

[Series 2: routine abdomen · axial · 0.70mm/px · z∈[-408,-88]mm · 6 of 90 slices shown, 11 images]
[im 13/90  soft-tissue]
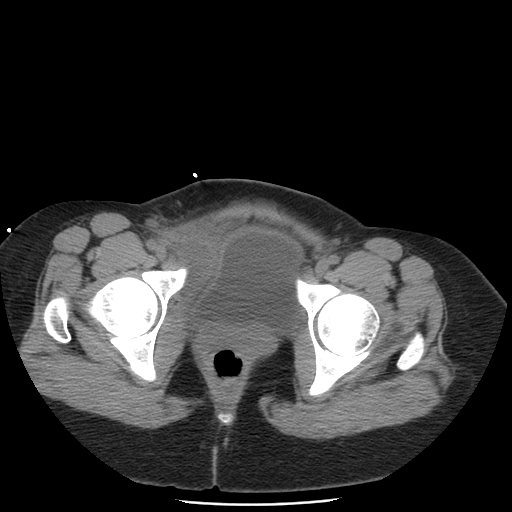
[im 13/90  bone]
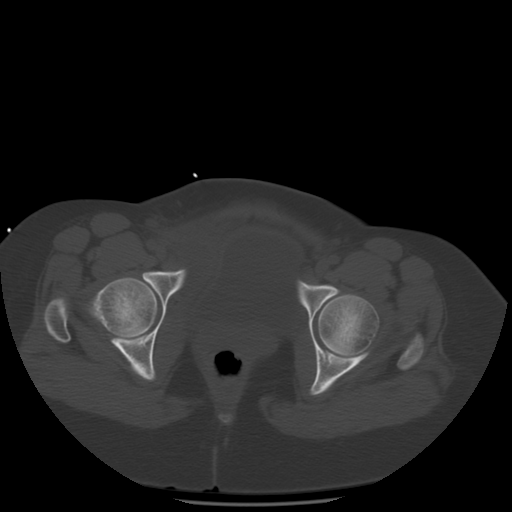
[im 26/90  soft-tissue]
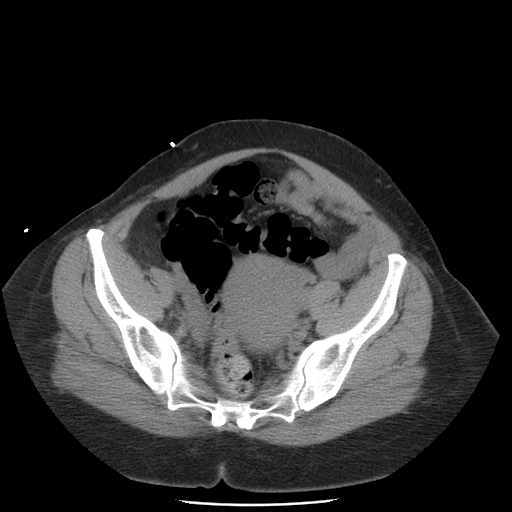
[im 39/90  soft-tissue]
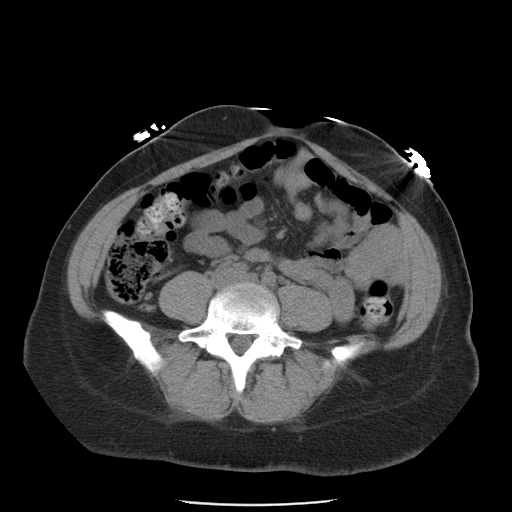
[im 39/90  lung]
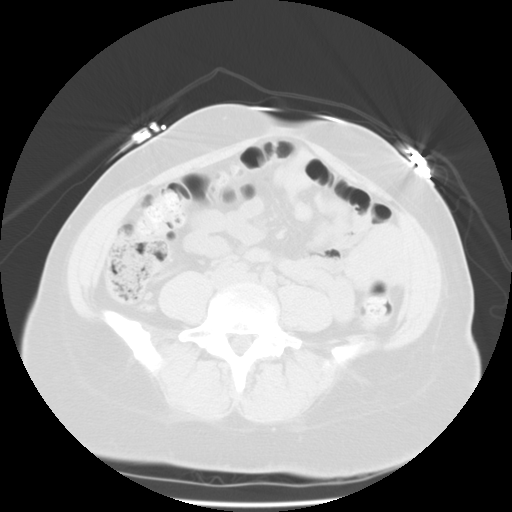
[im 51/90  soft-tissue]
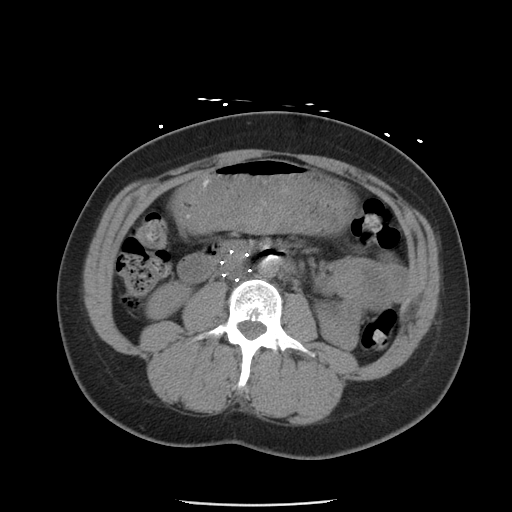
[im 51/90  lung]
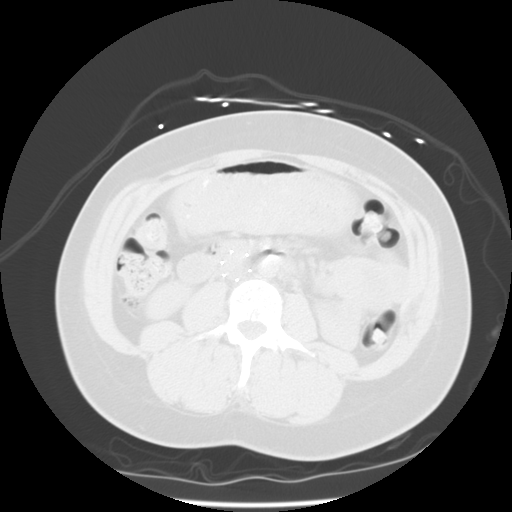
[im 64/90  soft-tissue]
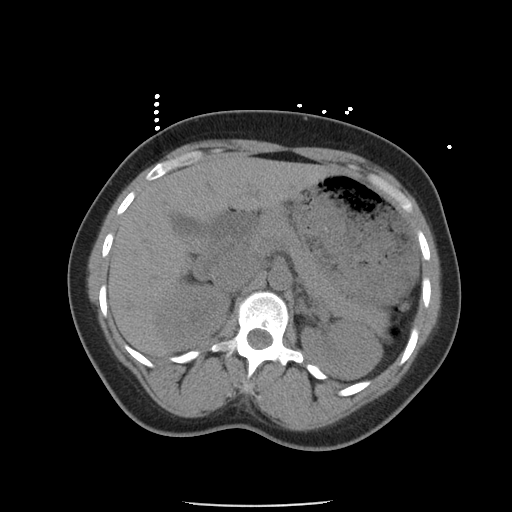
[im 64/90  lung]
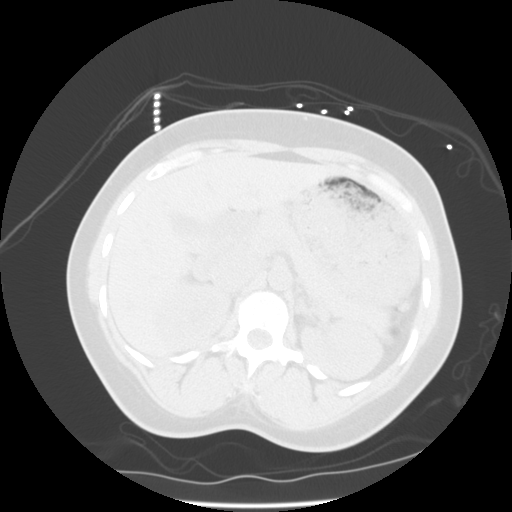
[im 77/90  soft-tissue]
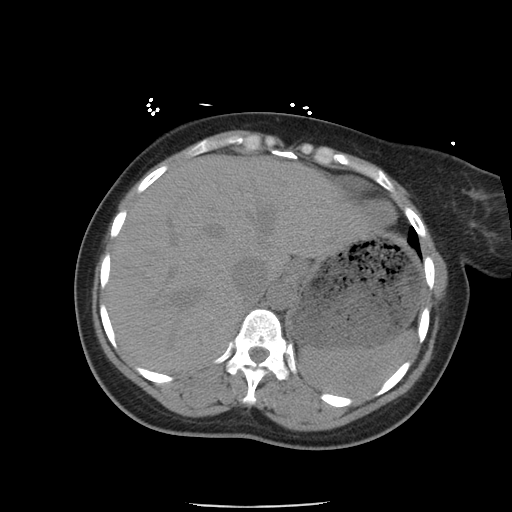
[im 77/90  lung]
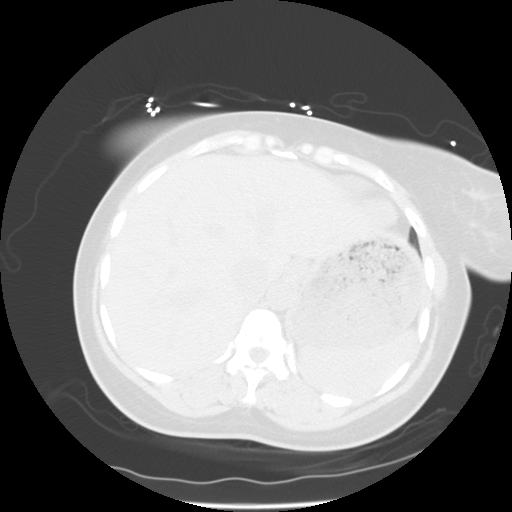

[Series 400: reformatted · sagittal · 0.90mm/px · 8 of 157 slices shown]
[im 14/157  soft-tissue]
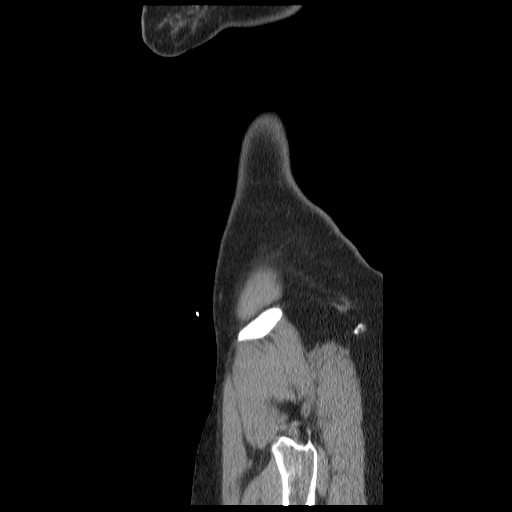
[im 40/157  soft-tissue]
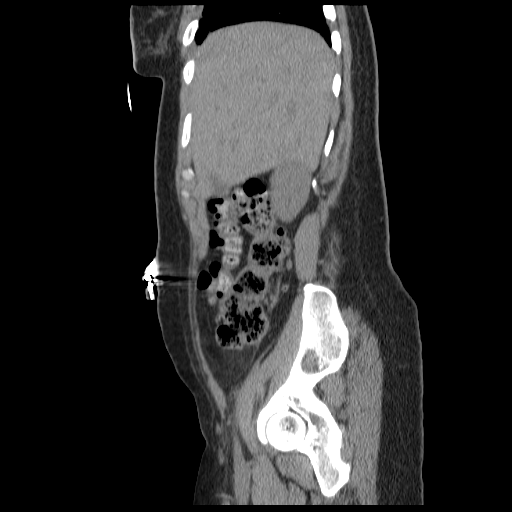
[im 53/157  soft-tissue]
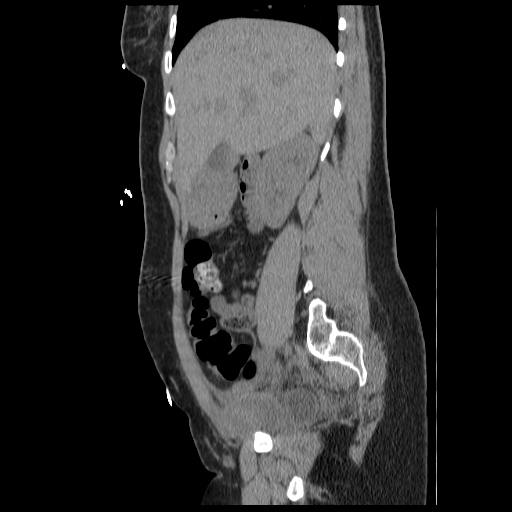
[im 66/157  soft-tissue]
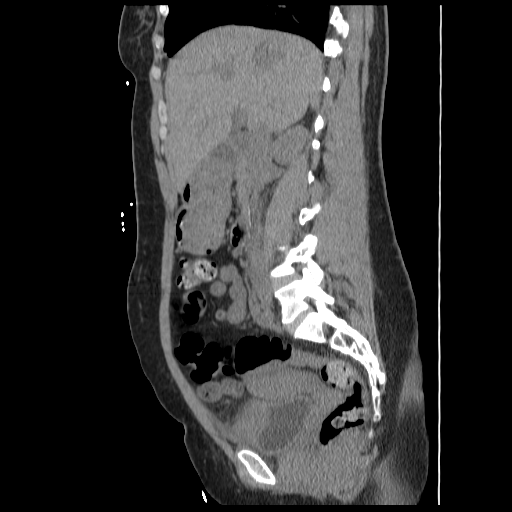
[im 92/157  soft-tissue]
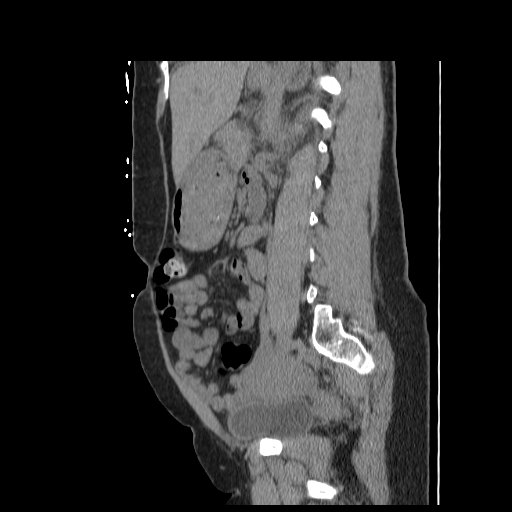
[im 105/157  soft-tissue]
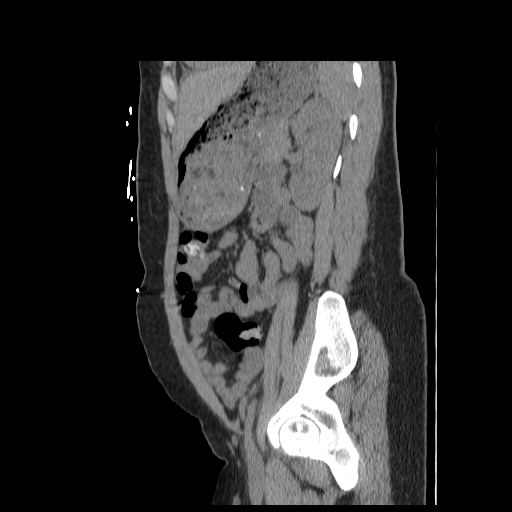
[im 118/157  soft-tissue]
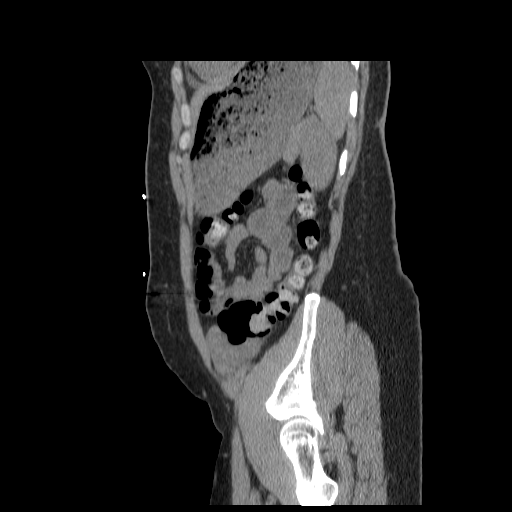
[im 144/157  soft-tissue]
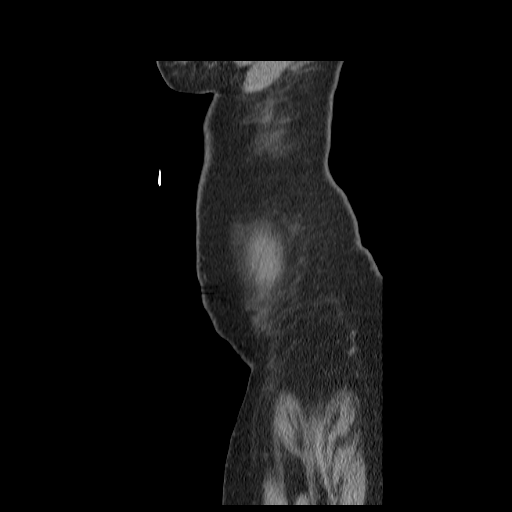

[14 of 32 positions shown; findings below may reference images not displayed]

FINDINGS: There is atelectasis identified at both lung bases.  No pleural or pericardial effusion.   The liver is normal in attenuation and morphology.  
The right kidney has a focal area of low attenuation within its interpolar region incompletely characterized without IV contrast.  
The left kidney is negative.  There is an inferior vena cava filter in place.   The appendix is normal.  There is no lymphadenopathy.   The right adrenal gland is negative. The left adrenal gland is negative.   The pancreas is negative.  No free abdominal fluid.   Review of bone windows is unremarkable.
IMPRESSION: 1.  Atelectasis, both lung bases. 
2.    IVC filter. 
3.  Low attenuation lesion in right kidney incompletely characterized. 
PELVIS CT WITHOUT CONTRAST:
FINDINGS: There is a heterogeneous mass within the right iliac fossa measuring 5.6 x 3.7 cm.  This likely represents a small hematoma as a result of the patient?s recent catheterization.  The uterus and adnexal structures are grossly unremarkable.  The urinary bladder is negative. There is no free intraperitoneal fluid.
IMPRESSION: Right iliac fossa hematoma status-post catheterization.

## 2006-01-20 IMAGING — CT CT ABDOMEN WO/W CM
1 of 7 series · 14 of 32 positions shown, 19 images · IV contrast (ORAL OMNI 350 & 100 ML OMNI 300)
Comparison: none

CLINICAL DATA: Indeterminate renal lesion seen on recent CT.
ABDOMEN CT WITHOUT AND WITH CONTRAST:
TECHNIQUE: Multidetector CT imaging of the abdomen was performed following the standard protocol both before and during bolus administration of intravenous contrast.  Dedicated imaging through the kidneys was performed.
Contrast:  100 cc Omnipaque 300 IV.
Comparison to a noncontrast CT from [DATE].

[Series 103: renal mass w · axial · 0.70mm/px · z∈[-281,-22]mm · 14 of 674 slices shown, 19 images]
[im 33/674  soft-tissue]
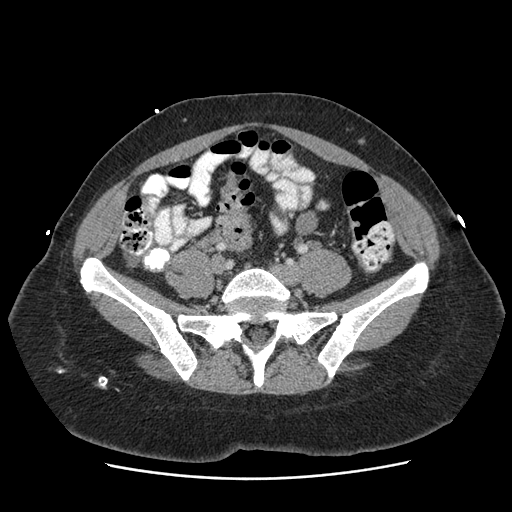
[im 33/674  bone]
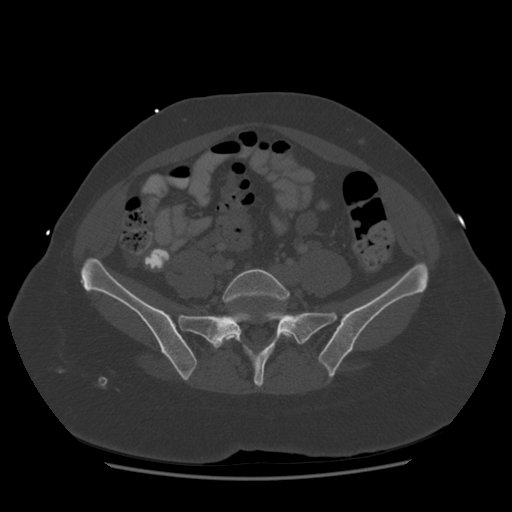
[im 97/674  soft-tissue]
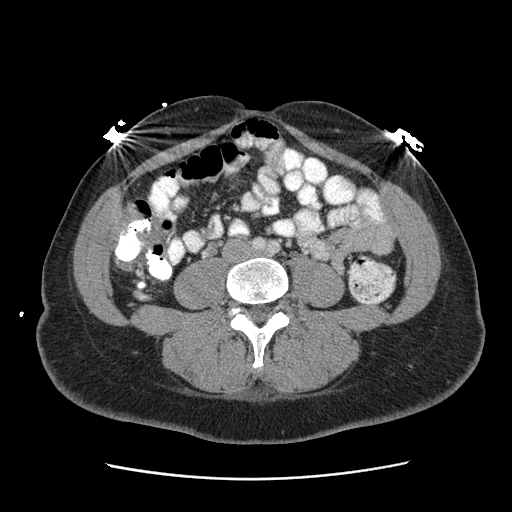
[im 129/674  soft-tissue]
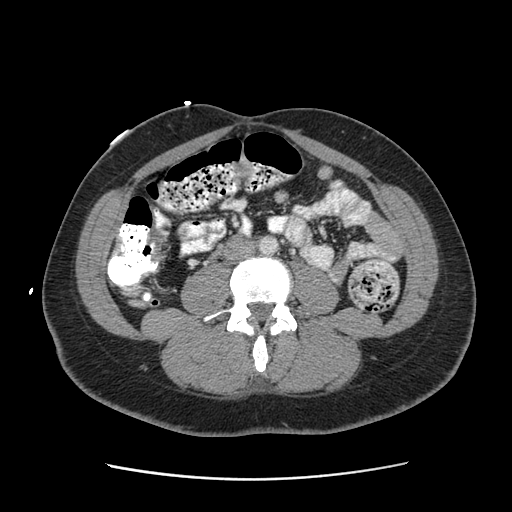
[im 193/674  soft-tissue]
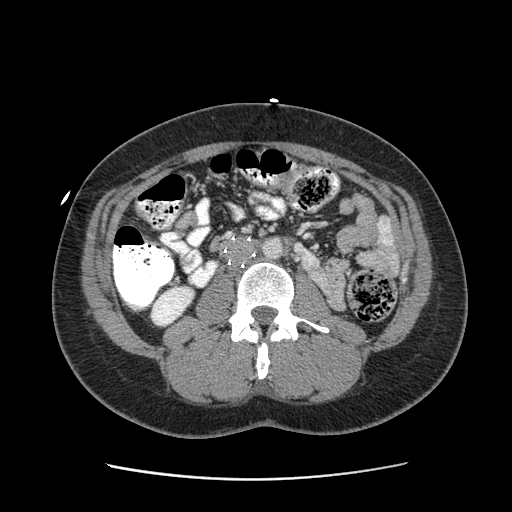
[im 225/674  soft-tissue]
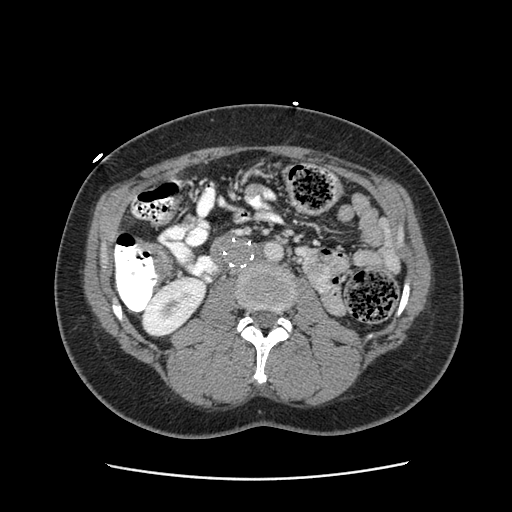
[im 289/674  soft-tissue]
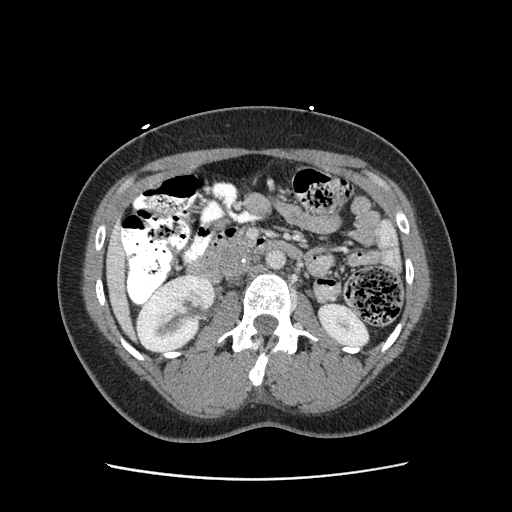
[im 353/674  soft-tissue]
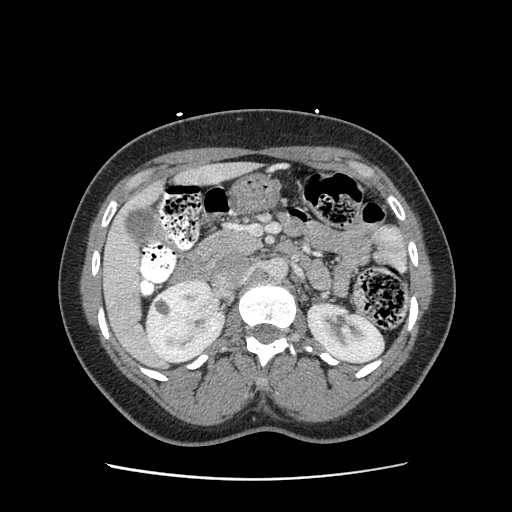
[im 385/674  soft-tissue]
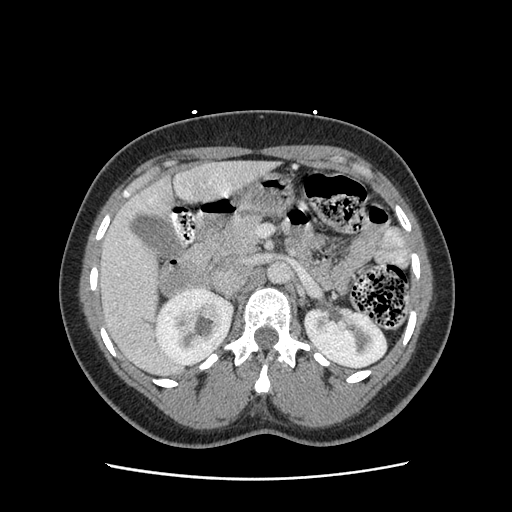
[im 449/674  soft-tissue]
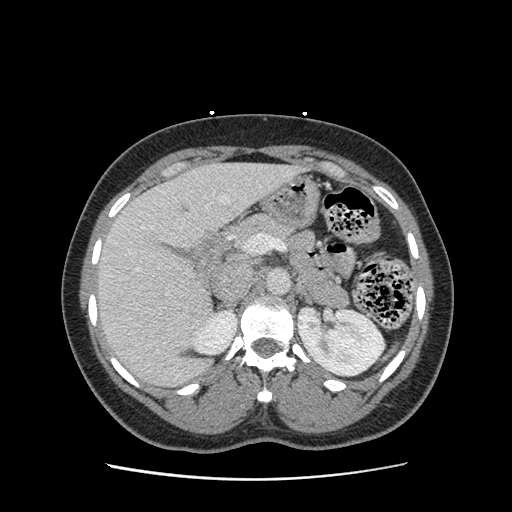
[im 449/674  bone]
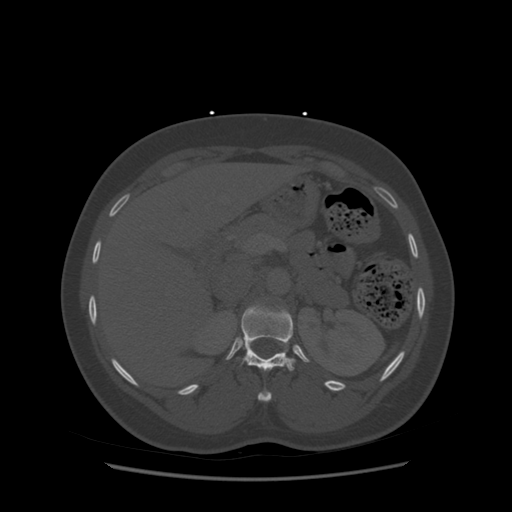
[im 481/674  soft-tissue]
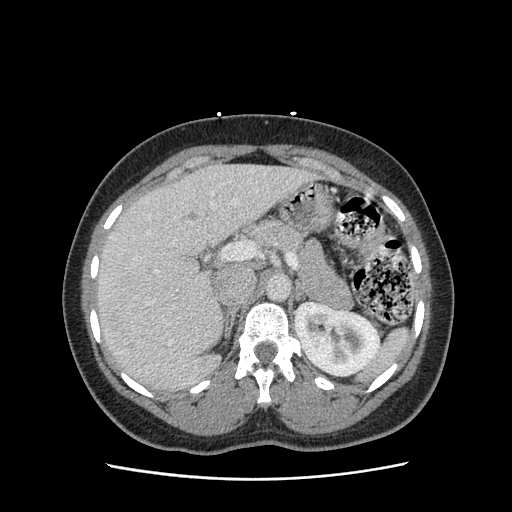
[im 545/674  soft-tissue]
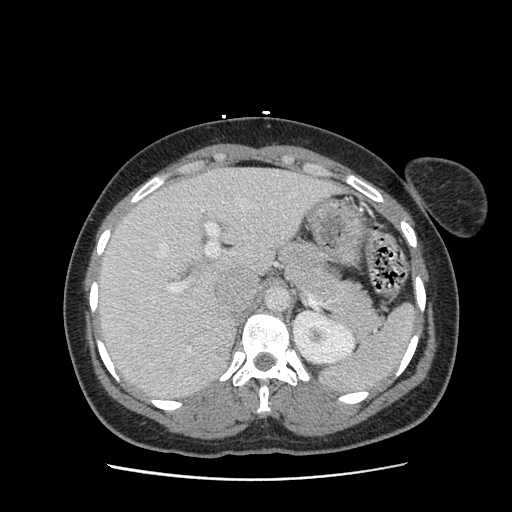
[im 545/674  lung]
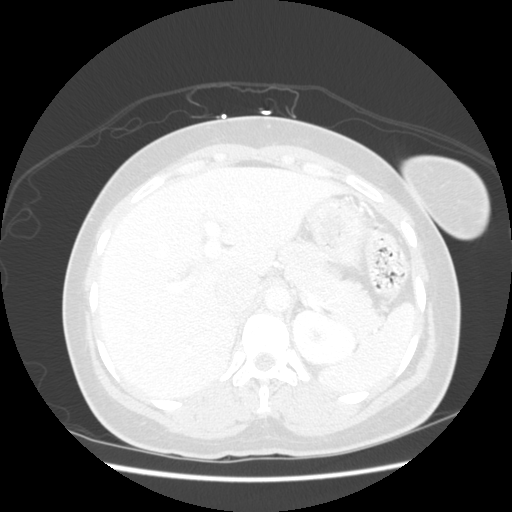
[im 577/674  soft-tissue]
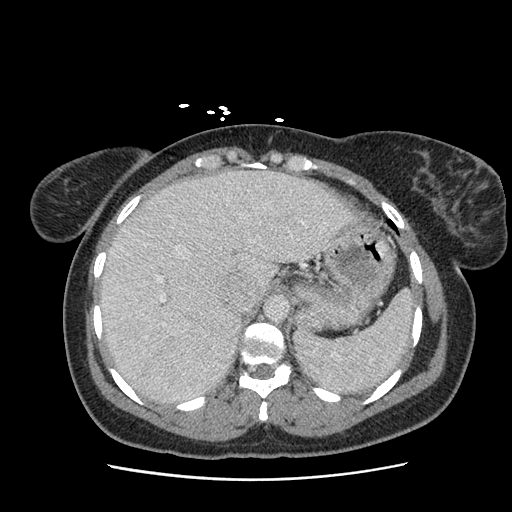
[im 577/674  lung]
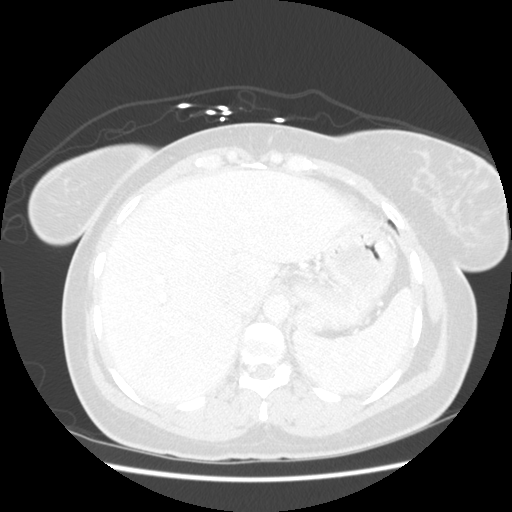
[im 609/674  lung]
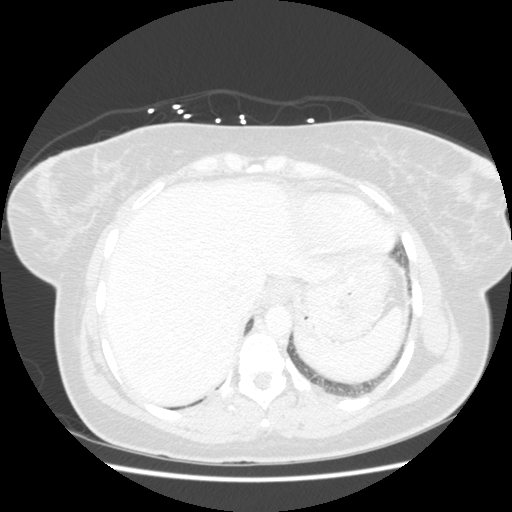
[im 641/674  soft-tissue]
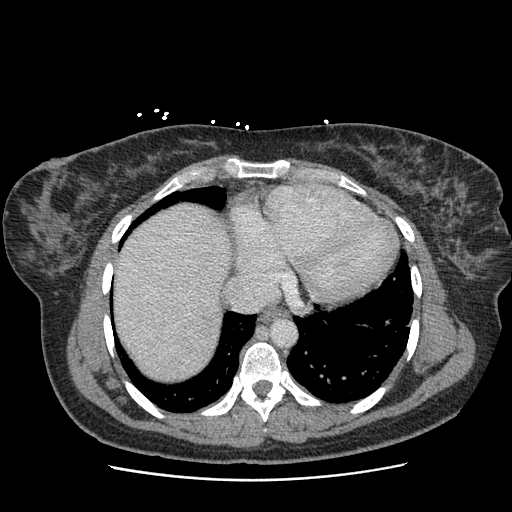
[im 641/674  lung]
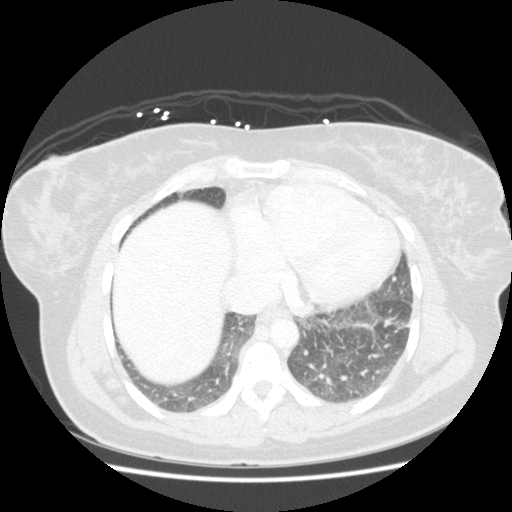

[14 of 32 positions shown; findings below may reference images not displayed]

FINDINGS: An 11 x 8 mm hypodense lesion within the mid right kidney measures 11 to 23 Hounsfield units on noncontrast images and 20-25 Hounsfield units on postcontrast images compatible with a nonenhancing lesion - cyst.  No solid renal lesions are identified.  The remainder of the kidneys are unremarkable.  The liver, adrenal glands, pancreas, and gallbladder are within normal limits. An IVC filter is identified.  The visualized bowel is unremarkable.
IMPRESSION: Nonenhancing right renal lesion compatible with a cyst.

## 2006-01-21 IMAGING — US US MISC SOFT TISSUE
1 series · 11 of 11 positions shown · non-contrast
Comparison: No prior ultrasound but this exam is correlated with the CT scan [DATE].

CLINICAL DATA: Right groin pain.  CT [DATE] shows a hematoma in the right groin /iliac region.  
 US RIGHT GROIN/ILIAC FOSSA:

[Series 1: unknown · 0.15mm/px · 11 of 11 slices shown]
[im 1/11]
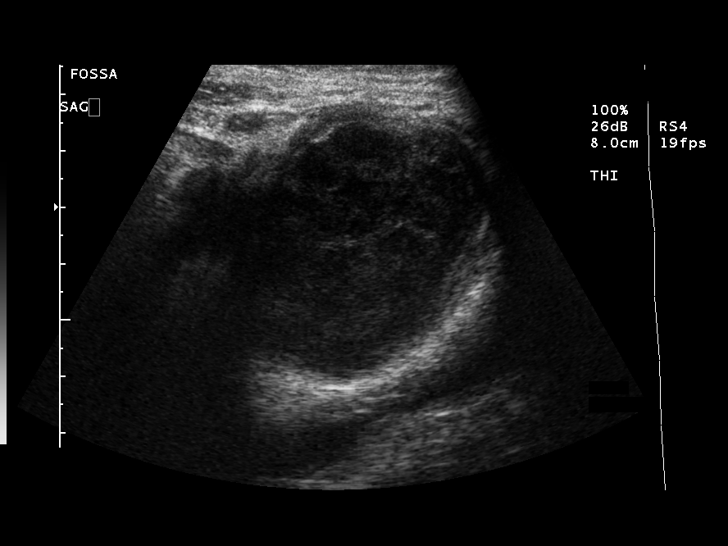
[im 2/11]
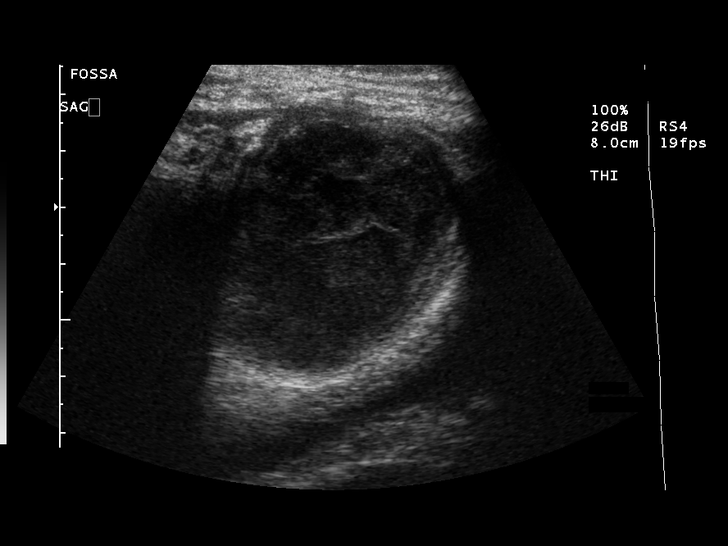
[im 3/11]
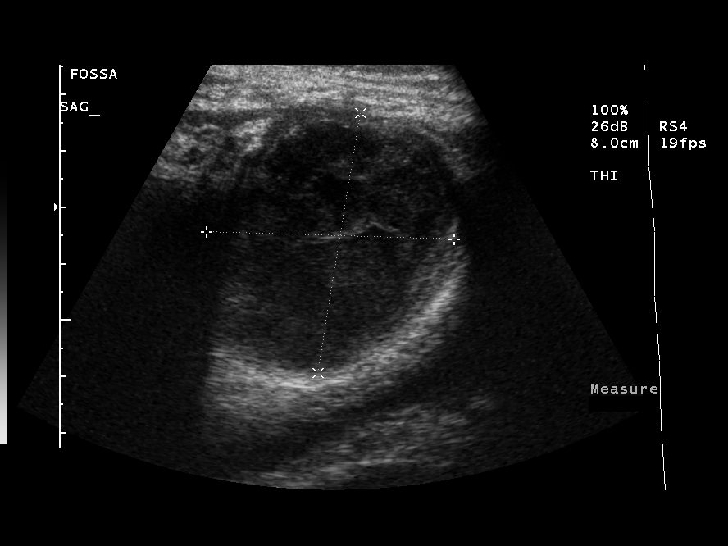
[im 4/11]
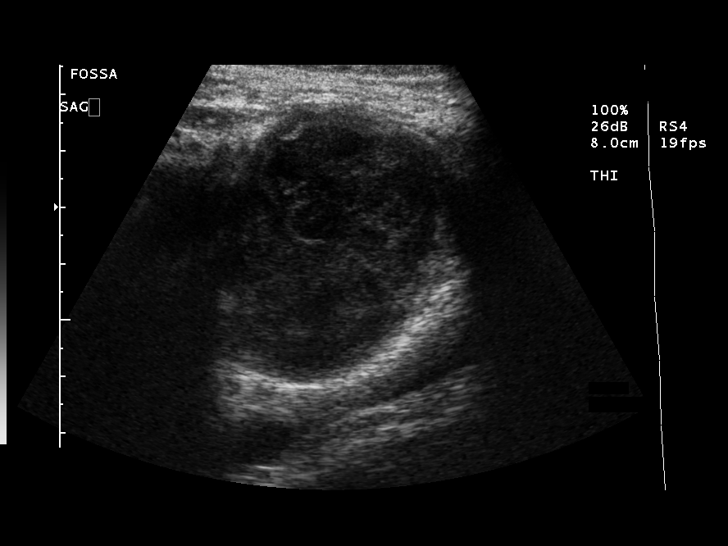
[im 5/11]
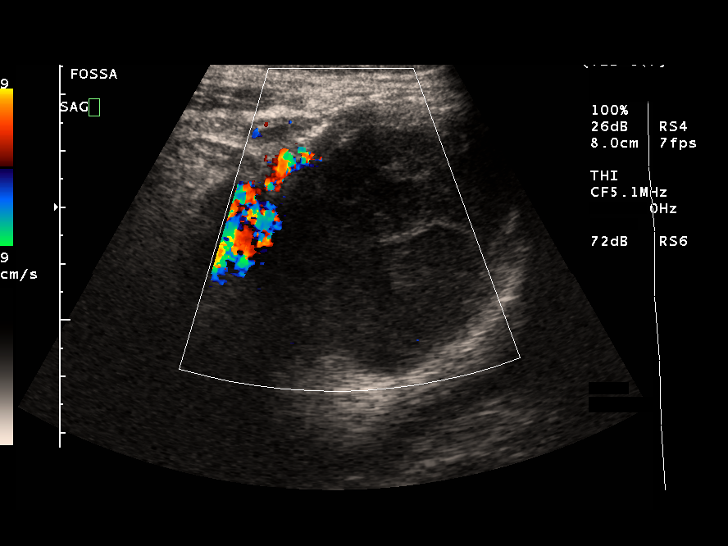
[im 6/11]
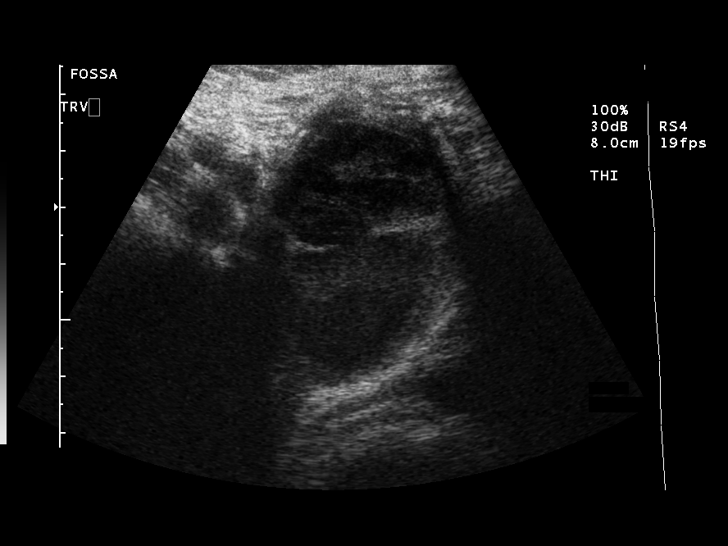
[im 7/11]
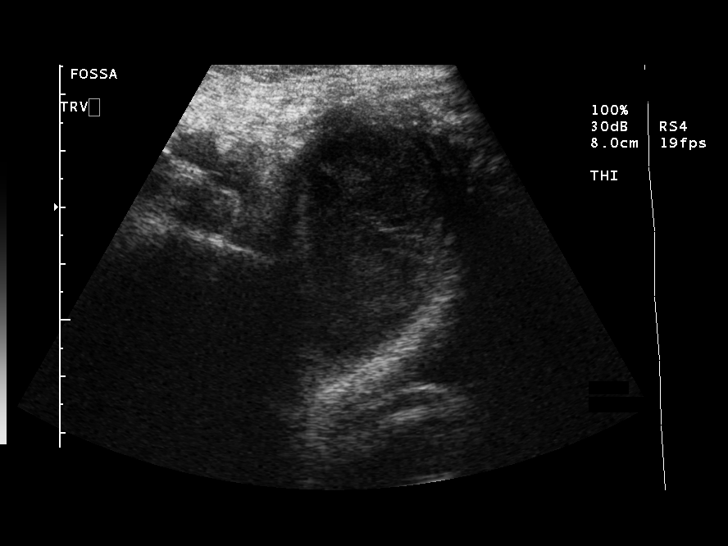
[im 8/11]
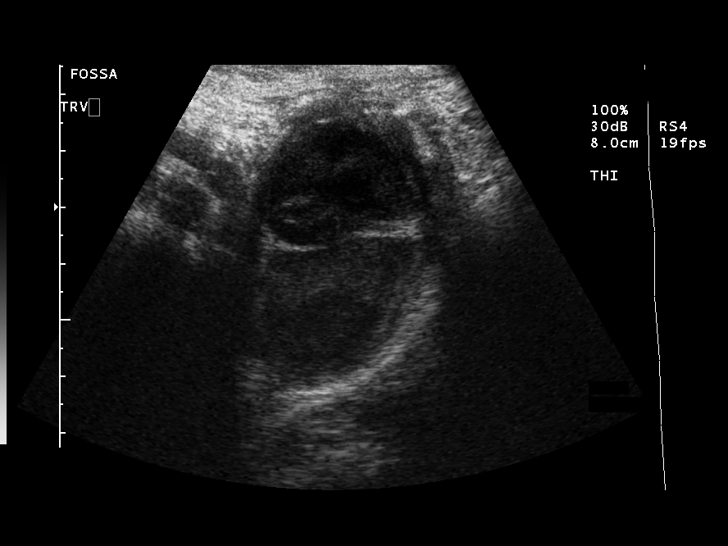
[im 9/11]
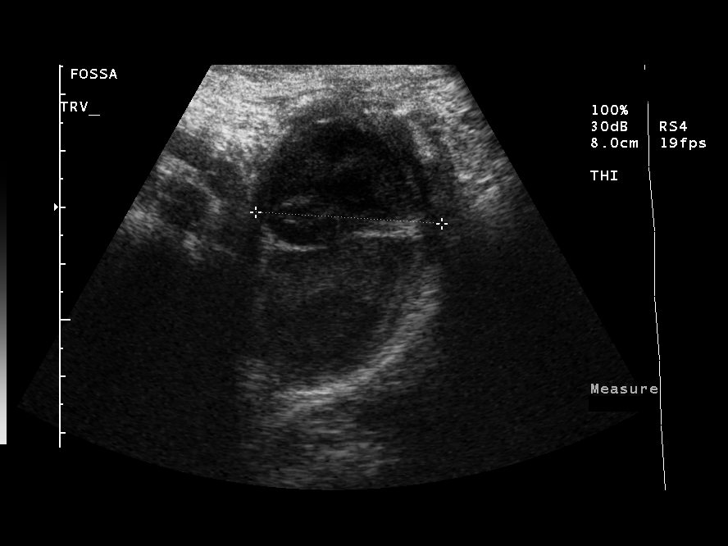
[im 10/11]
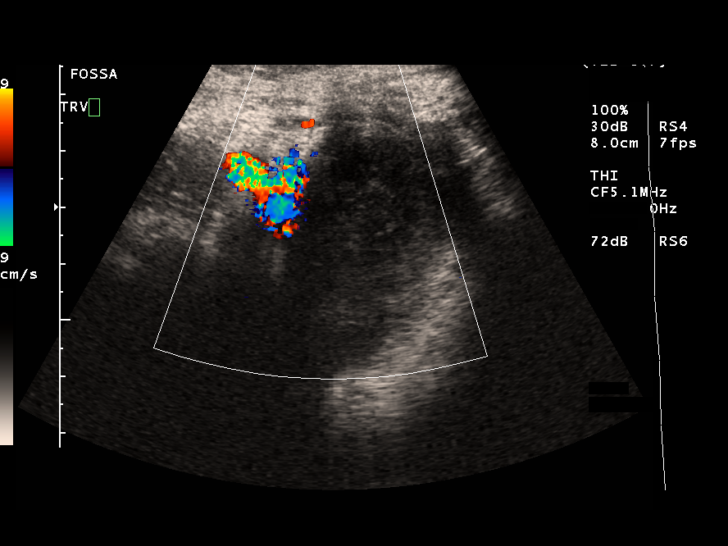
[im 11/11]
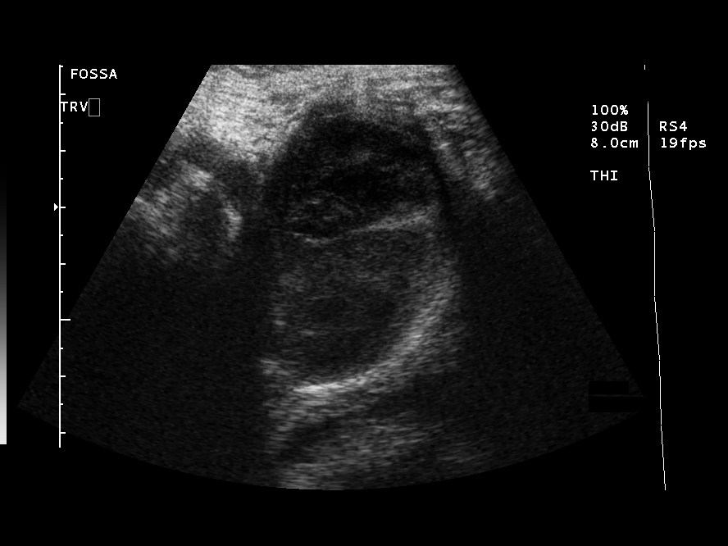

[11 of 11 positions shown; findings below may reference images not displayed]

FINDINGS: An inhomogeneous solid mass is noted in the right iliac fossa/groin region, correlating well with the previous CT.  It measures approximately 4.4 x 4.7 x 3.3 cm.  The appearance is consistent with a hematoma.  One cannot exclude a neoplasm.  This needs continued follow-up.
IMPRESSION: 4.4 x 4.6 x 3.3 cm heterogeneous mass in the right iliac fossa/groin region.  Most likely a hematoma but follow-up is needed to rule out neoplasm.

## 2006-01-24 IMAGING — US US MISC SOFT TISSUE
1 series · 6 of 6 positions shown · non-contrast
Comparison: [DATE]. Comparison is made with CT abdomen and pelvis [DATE].

CLINICAL DATA: Follow-up right iliac fossa.
 SOFT TISSUE ULTRASOUND:
TECHNIQUE: Ultrasound examination of the soft tissues was performed in the area of clinical concern.

[Series 1: unknown · 0.15mm/px · 6 of 6 slices shown]
[im 1/6]
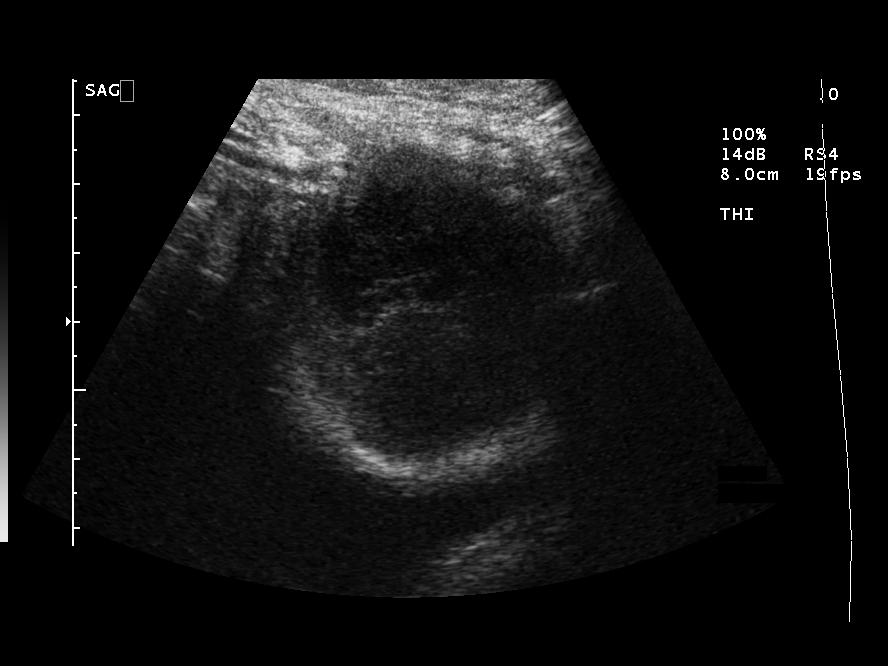
[im 2/6]
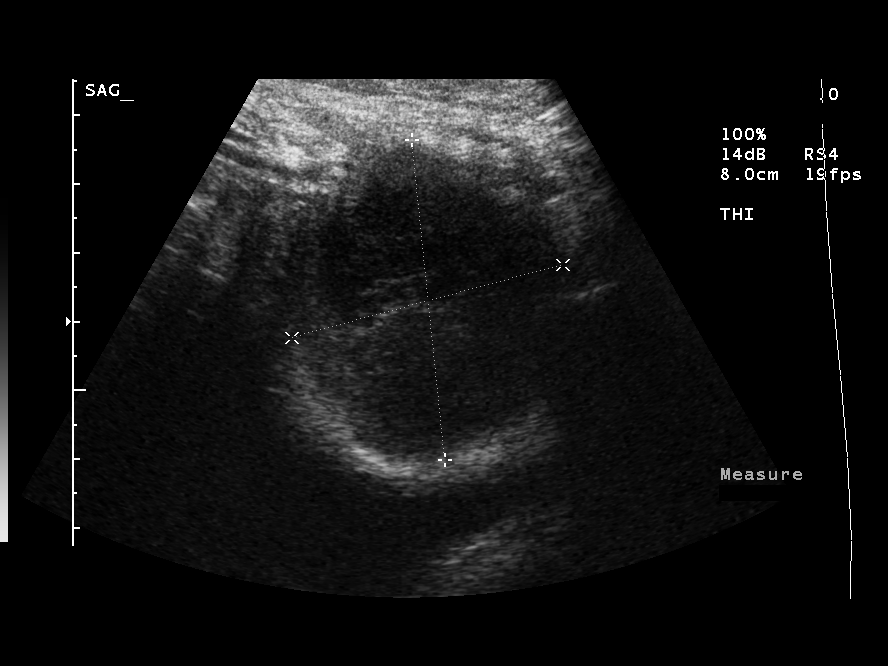
[im 3/6]
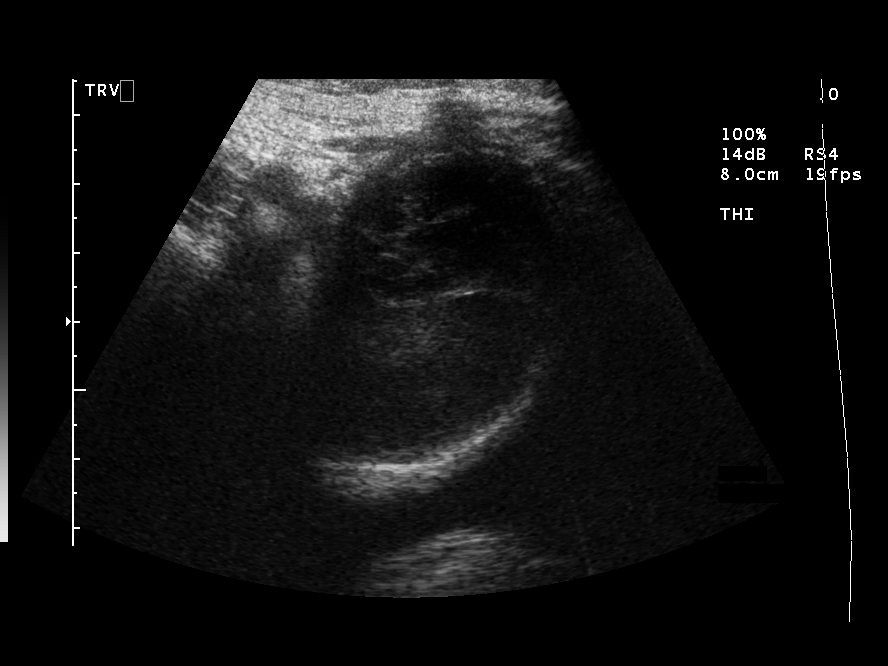
[im 4/6]
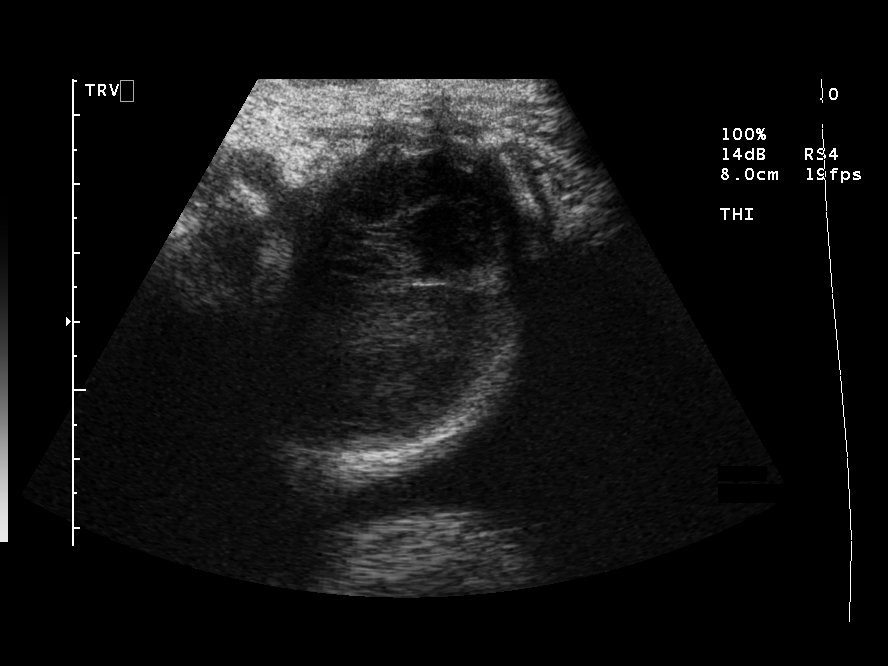
[im 5/6]
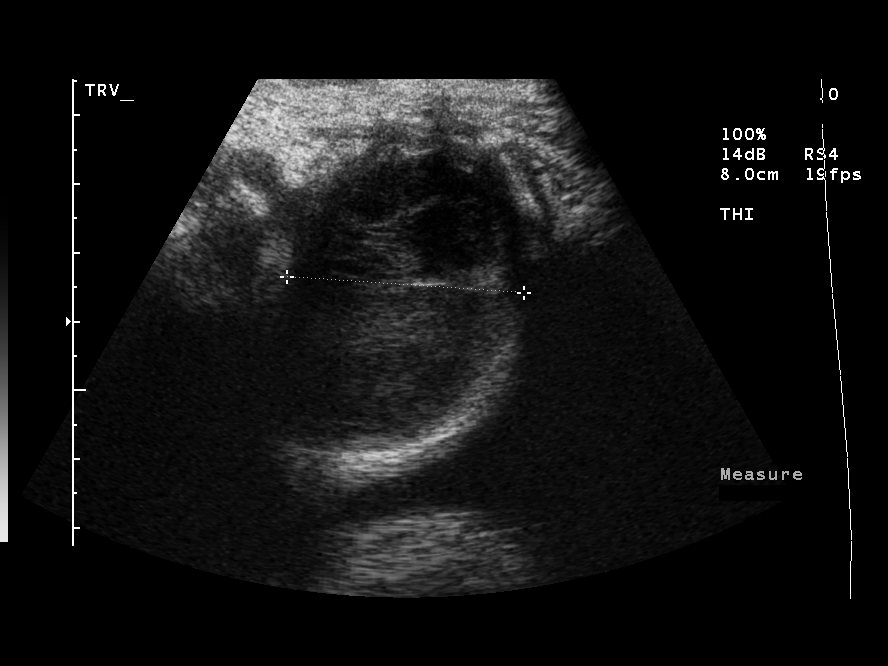
[im 6/6]
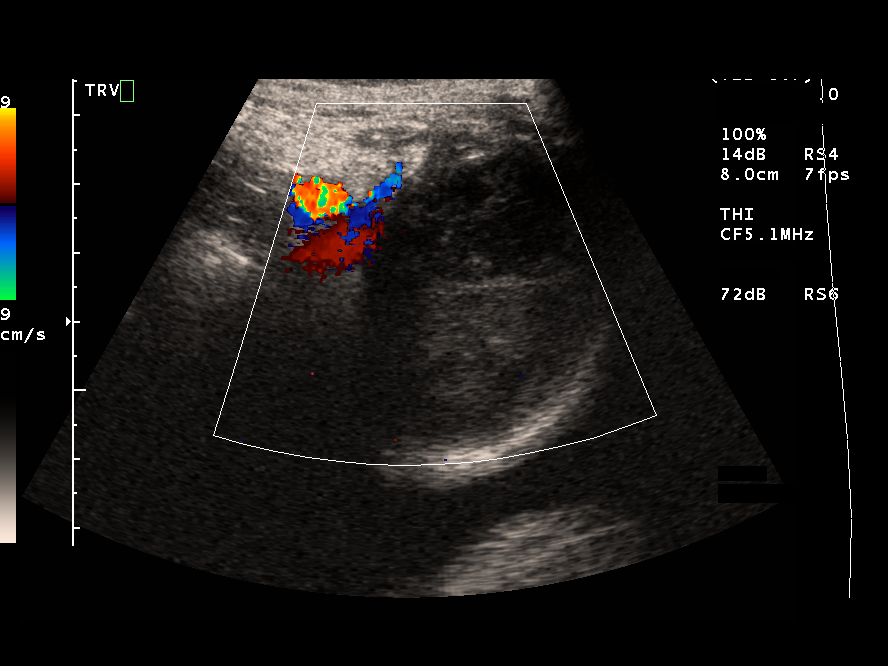

[6 of 6 positions shown; findings below may reference images not displayed]

FINDINGS: There is a 4.7 x 4.1 x 3.5 cm heterogeneous mass in the right iliac fossa, unchanged from 3 days ago.
IMPRESSION: Complex lesion in the right inguinal region is unchanged in the 3-day interval from [DATE]. This likely represents a hematoma in this patient with history of IVC filter placement on [DATE]. Continued follow-up would be useful in further evaluation.

## 2006-01-31 ENCOUNTER — Ambulatory Visit: Payer: Self-pay | Admitting: Internal Medicine

## 2006-02-03 ENCOUNTER — Ambulatory Visit: Payer: Self-pay | Admitting: Internal Medicine

## 2006-02-14 ENCOUNTER — Ambulatory Visit: Payer: Self-pay | Admitting: Internal Medicine

## 2006-02-28 ENCOUNTER — Ambulatory Visit: Payer: Self-pay | Admitting: Pulmonary Disease

## 2006-03-07 ENCOUNTER — Ambulatory Visit: Payer: Self-pay | Admitting: Internal Medicine

## 2006-03-28 ENCOUNTER — Ambulatory Visit: Payer: Self-pay | Admitting: Hospitalist

## 2006-03-28 IMAGING — CT CT ANGIO CHEST
1 of 6 series · 11 of 30 positions shown · IV contrast (120 ML OMNI 300)
Comparison: Chest CT [DATE] and chest radiograph [DATE].

CLINICAL DATA: Shortness of breath, cough.  
CT ANGIOGRAPHY OF CHEST:
TECHNIQUE: Multidetector CT imaging of the chest was performed during bolus injection of intravenous contrast.  Multiplanar CT angiographic image reconstructions were generated to evaluate the vascular anatomy.
Contrast:  [SZ] of Omnipaque 300 IV contrast.

[Series 4: pe · axial · 0.62mm/px · z∈[-269,-43]mm · 11 of 223 slices shown]
[im 21/223  lung]
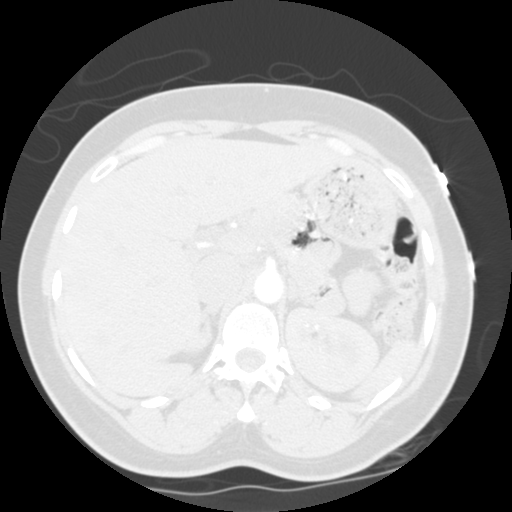
[im 41/223  mediastinal]
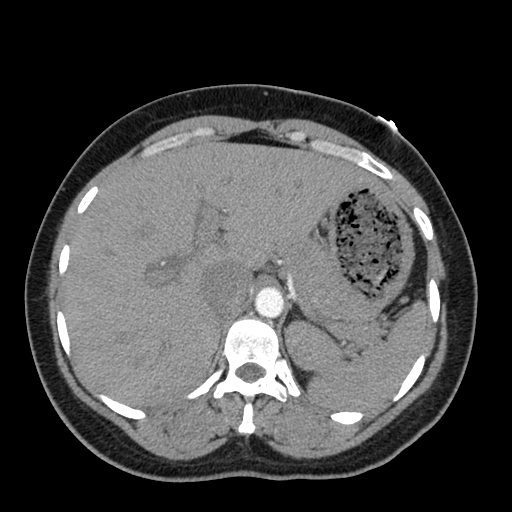
[im 61/223  lung]
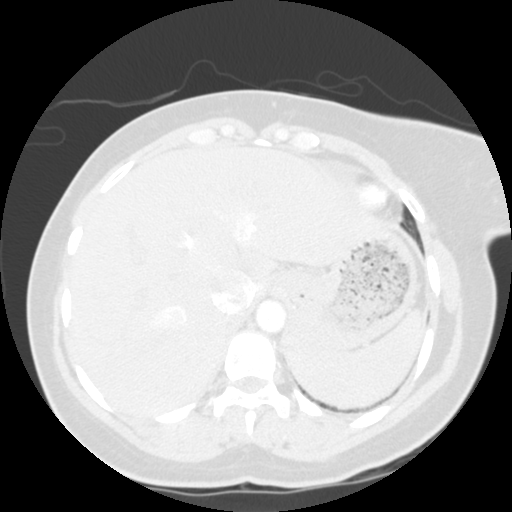
[im 81/223  mediastinal]
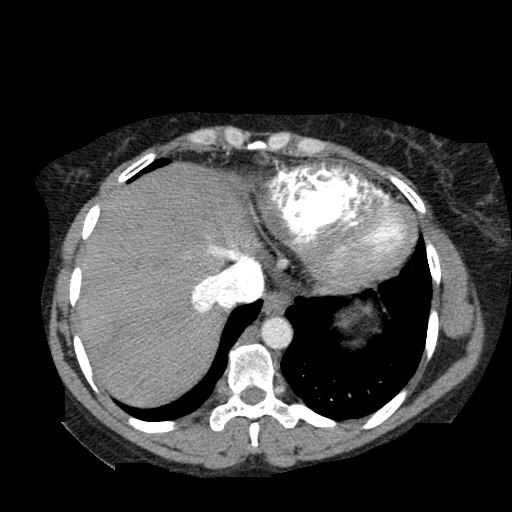
[im 101/223  lung]
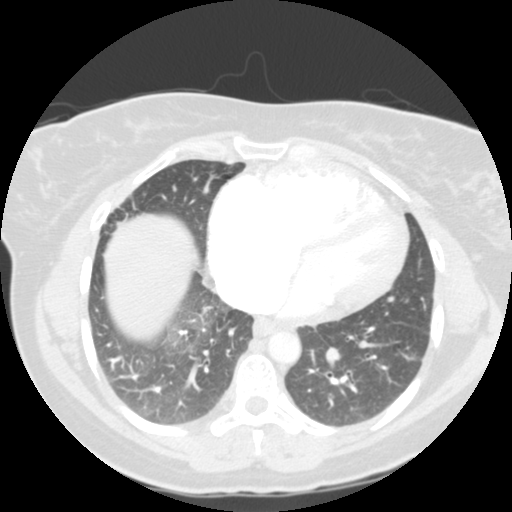
[im 104/223  mediastinal]
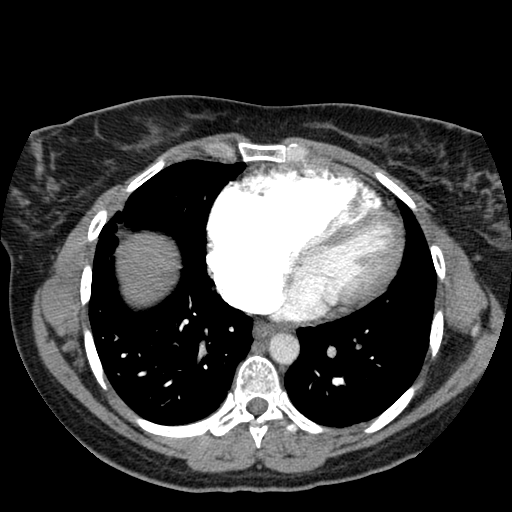
[im 122/223  lung]
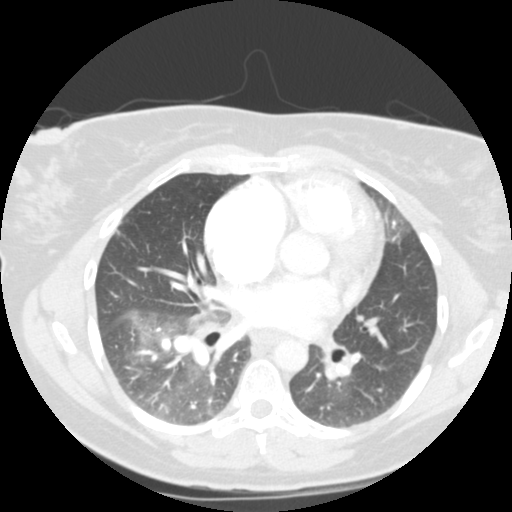
[im 142/223  mediastinal]
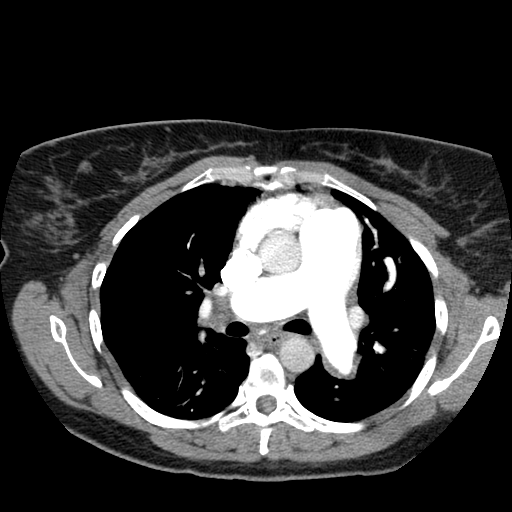
[im 162/223  lung]
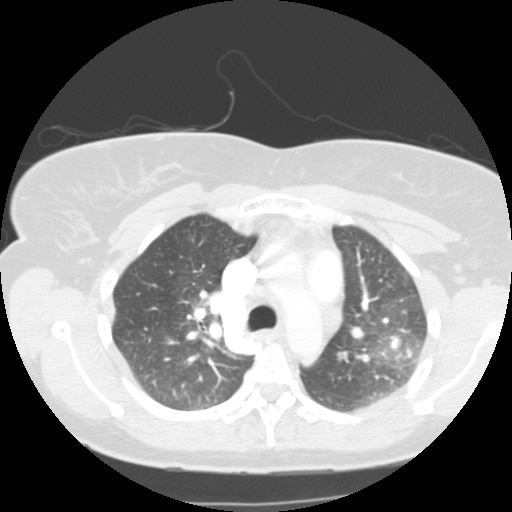
[im 182/223  mediastinal]
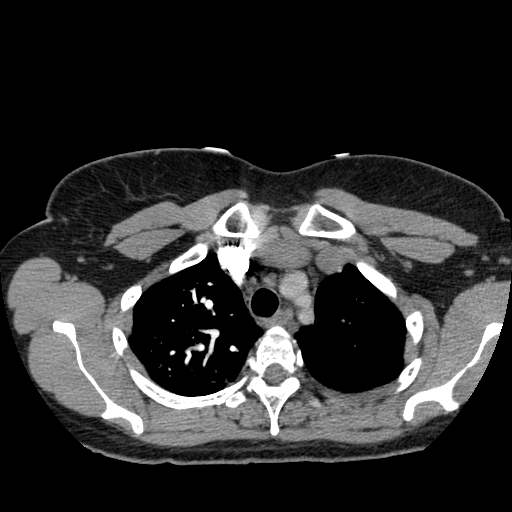
[im 202/223  lung]
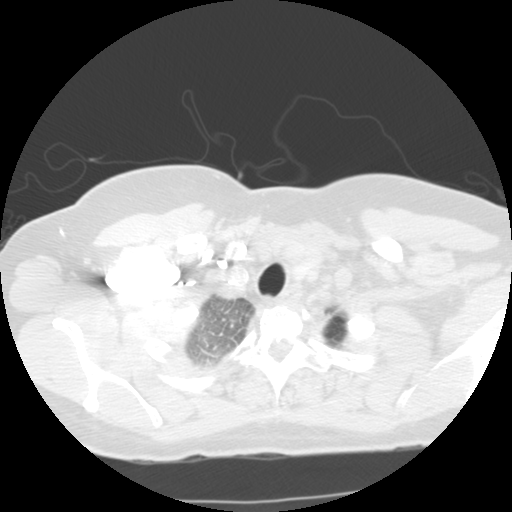

[11 of 30 positions shown; findings below may reference images not displayed]

FINDINGS: Again noted is relative enlargement of the pulmonary artery with the main trunk measuring 3.5 cm, with rapid tapering of the central and segmental pulmonary arterial branches consistent with pulmonary arterial hypertension.  No focal filling defect is seen up to and including the 3rd order pulmonary arteries to suggest acute pulmonary embolism.  Heart size is normal.  There is some enlargement of the right atrium and leftward deviation of the interventricular septum suggestive of increased right sided heart pressures.  No pericardial or pleural effusion.  No mediastinal lymphadenopathy.  Reflux of contrast into the hepatic veins also suggests elevated right heart pressures.  
Again noted are multiple confluent areas of ground-glass airspace opacity in nearly all pulmonary segments .   This has increased overall since the prior chest CT of [DATE].
IMPRESSION: 1.  Interval increase in multilobar ground-glass airspace opacity, for which the differential diagnosis is broad but includes alveolar filling processes such as hemorrhage, water, pus, cells, or protein. 
2.   CT evidence for pulmonary arterial hypertension and elevated right heart pressures.  

Findings called to Dr. HAWLADAR by Dr. HAWLADAR on [DATE].

## 2006-03-28 IMAGING — CR DG CHEST 1V PORT
1 series · 1 of 1 positions shown · non-contrast
Comparison: [DATE].

CLINICAL DATA: Shortness of breath, sickle cell disease.
 PORTABLE CHEST - 1 VIEW:

[view not recorded]
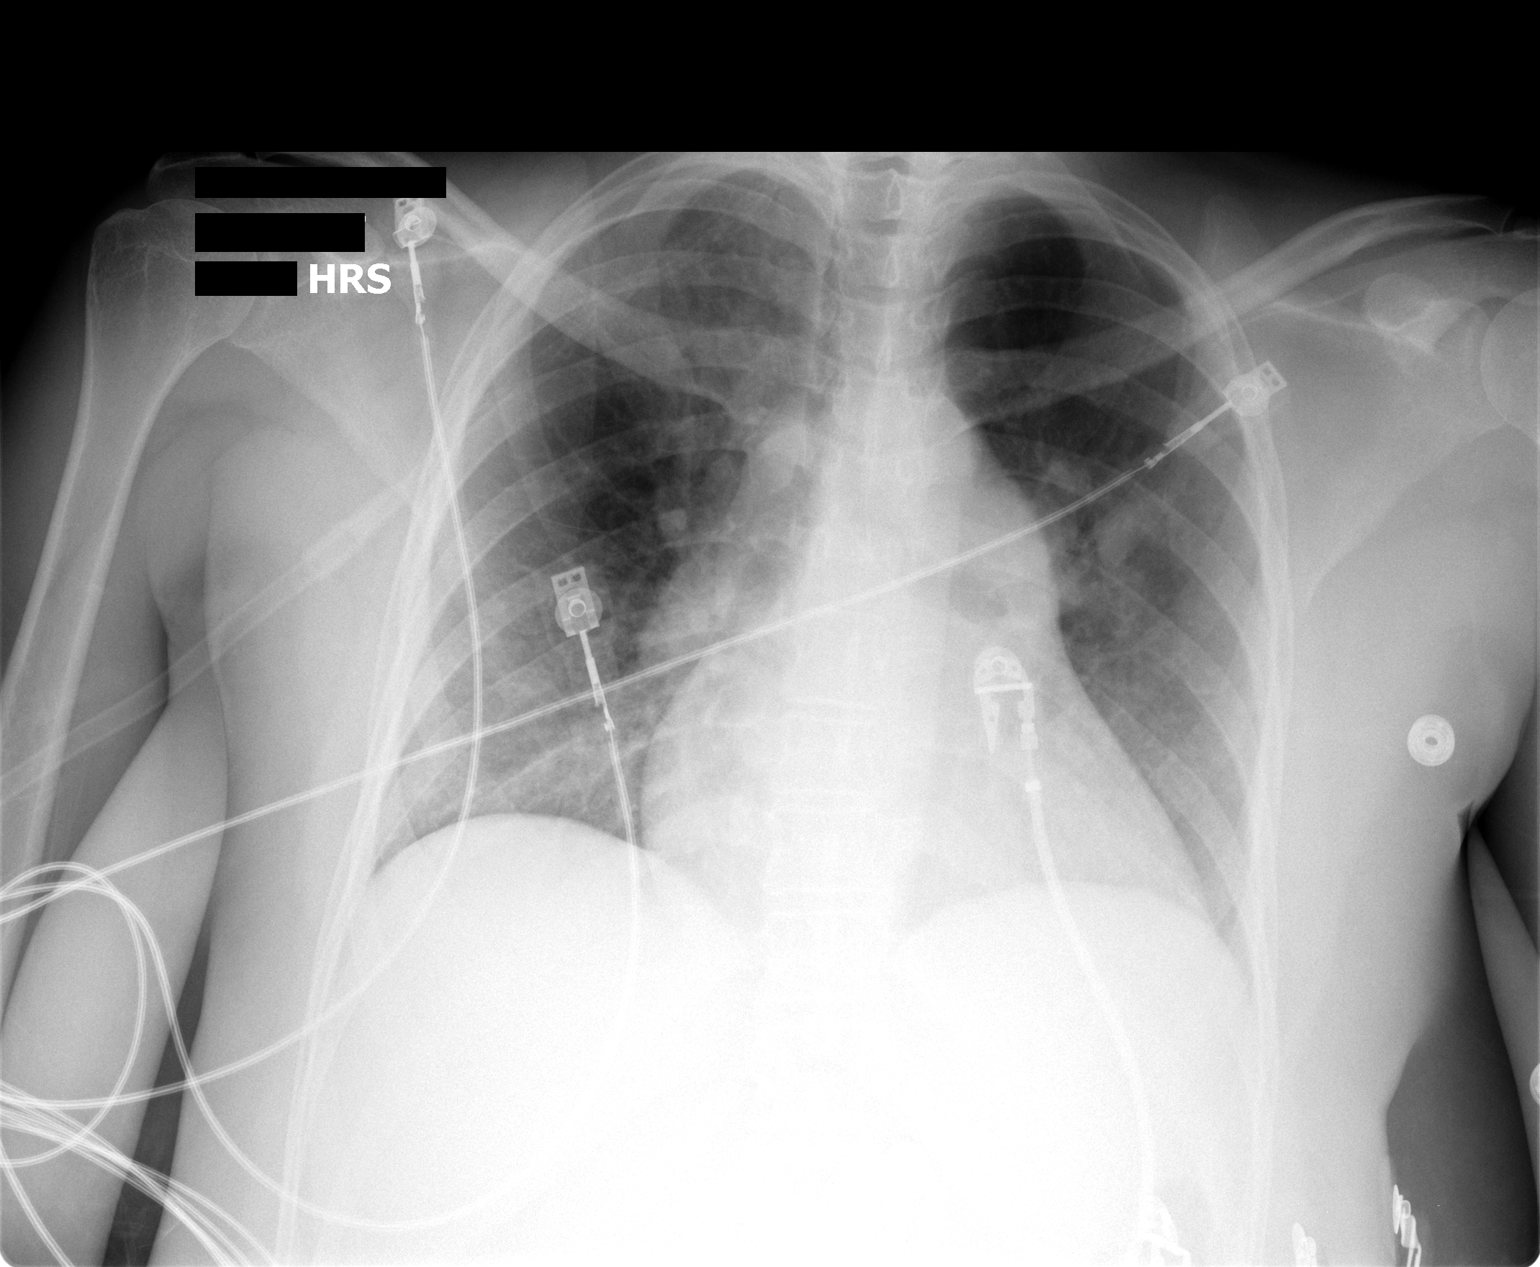

[1 of 1 positions shown; findings below may reference images not displayed]

FINDINGS: There is mild cardiomegaly again noted.  Patchy ill-defined airspace opacities again noted predominantly over the right upper lobe and lung bases.  An apparent tubular opacity over the left midlung zone corresponds to rapidly tapering pulmonary artery as seen on prior chest CT of [DATE].   No effusion or pneumothorax is seen.
IMPRESSION: 1.  Slightly improved aeration with right upper lobe and basilar ill-defined air space opacities, for which the differential diagnosis is broad but includes alveolar filling processes such as blood, water, pus, protein or cells.  
 2.  Prominent pulmonary arteries with rapid tapering, suggestive of pulmonary arterial hypertension.

## 2006-03-29 ENCOUNTER — Ambulatory Visit: Payer: Self-pay | Admitting: Internal Medicine

## 2006-03-29 ENCOUNTER — Encounter: Payer: Self-pay | Admitting: Vascular Surgery

## 2006-03-29 ENCOUNTER — Inpatient Hospital Stay (HOSPITAL_COMMUNITY): Admission: EM | Admit: 2006-03-29 | Discharge: 2006-03-31 | Payer: Self-pay | Admitting: Emergency Medicine

## 2006-03-29 IMAGING — CR DG CHEST 2V
2 series · 2 of 2 positions shown · non-contrast
Comparison: CT [DATE] and plain film of [DATE].

CLINICAL DATA: Pneumonia. 
 CHEST - 2 VIEW:

[w chest pa]
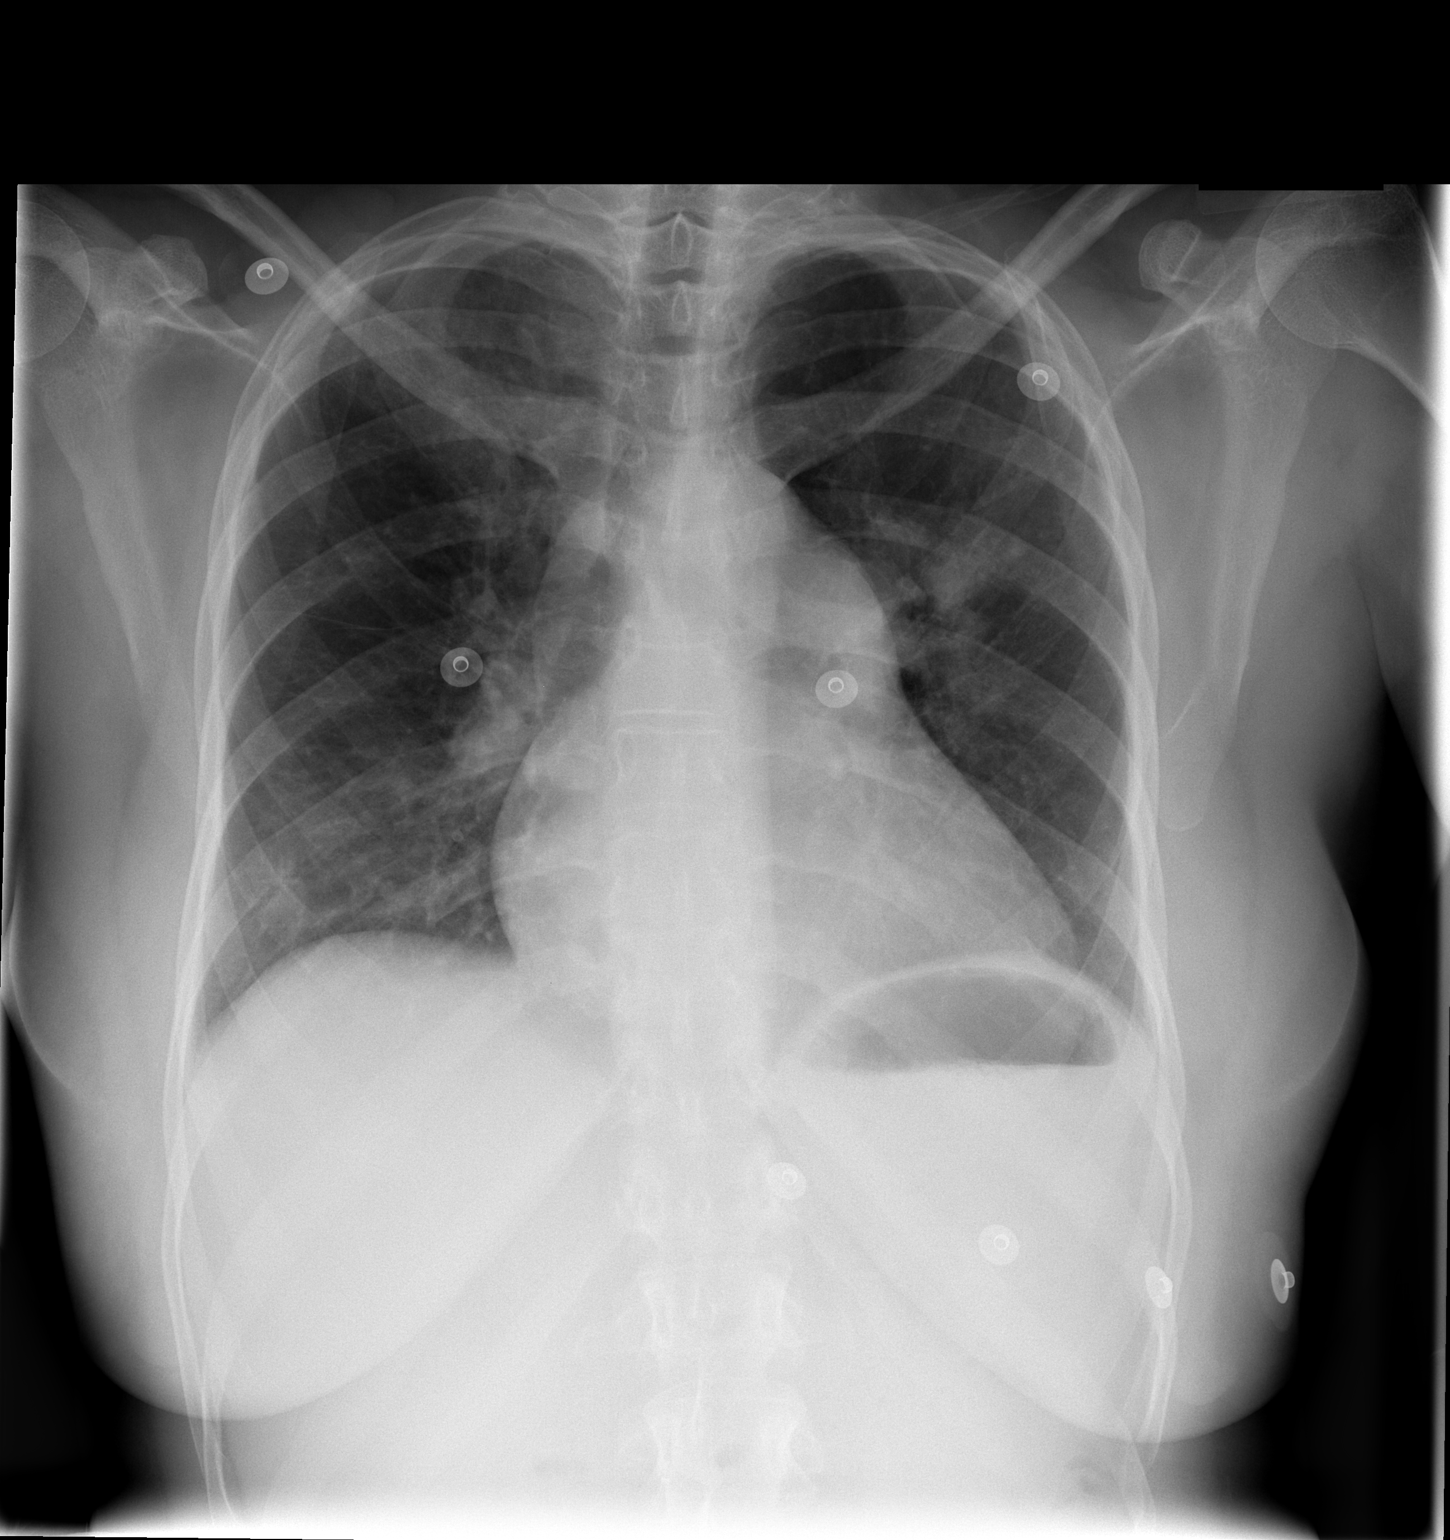

[w chest lat]
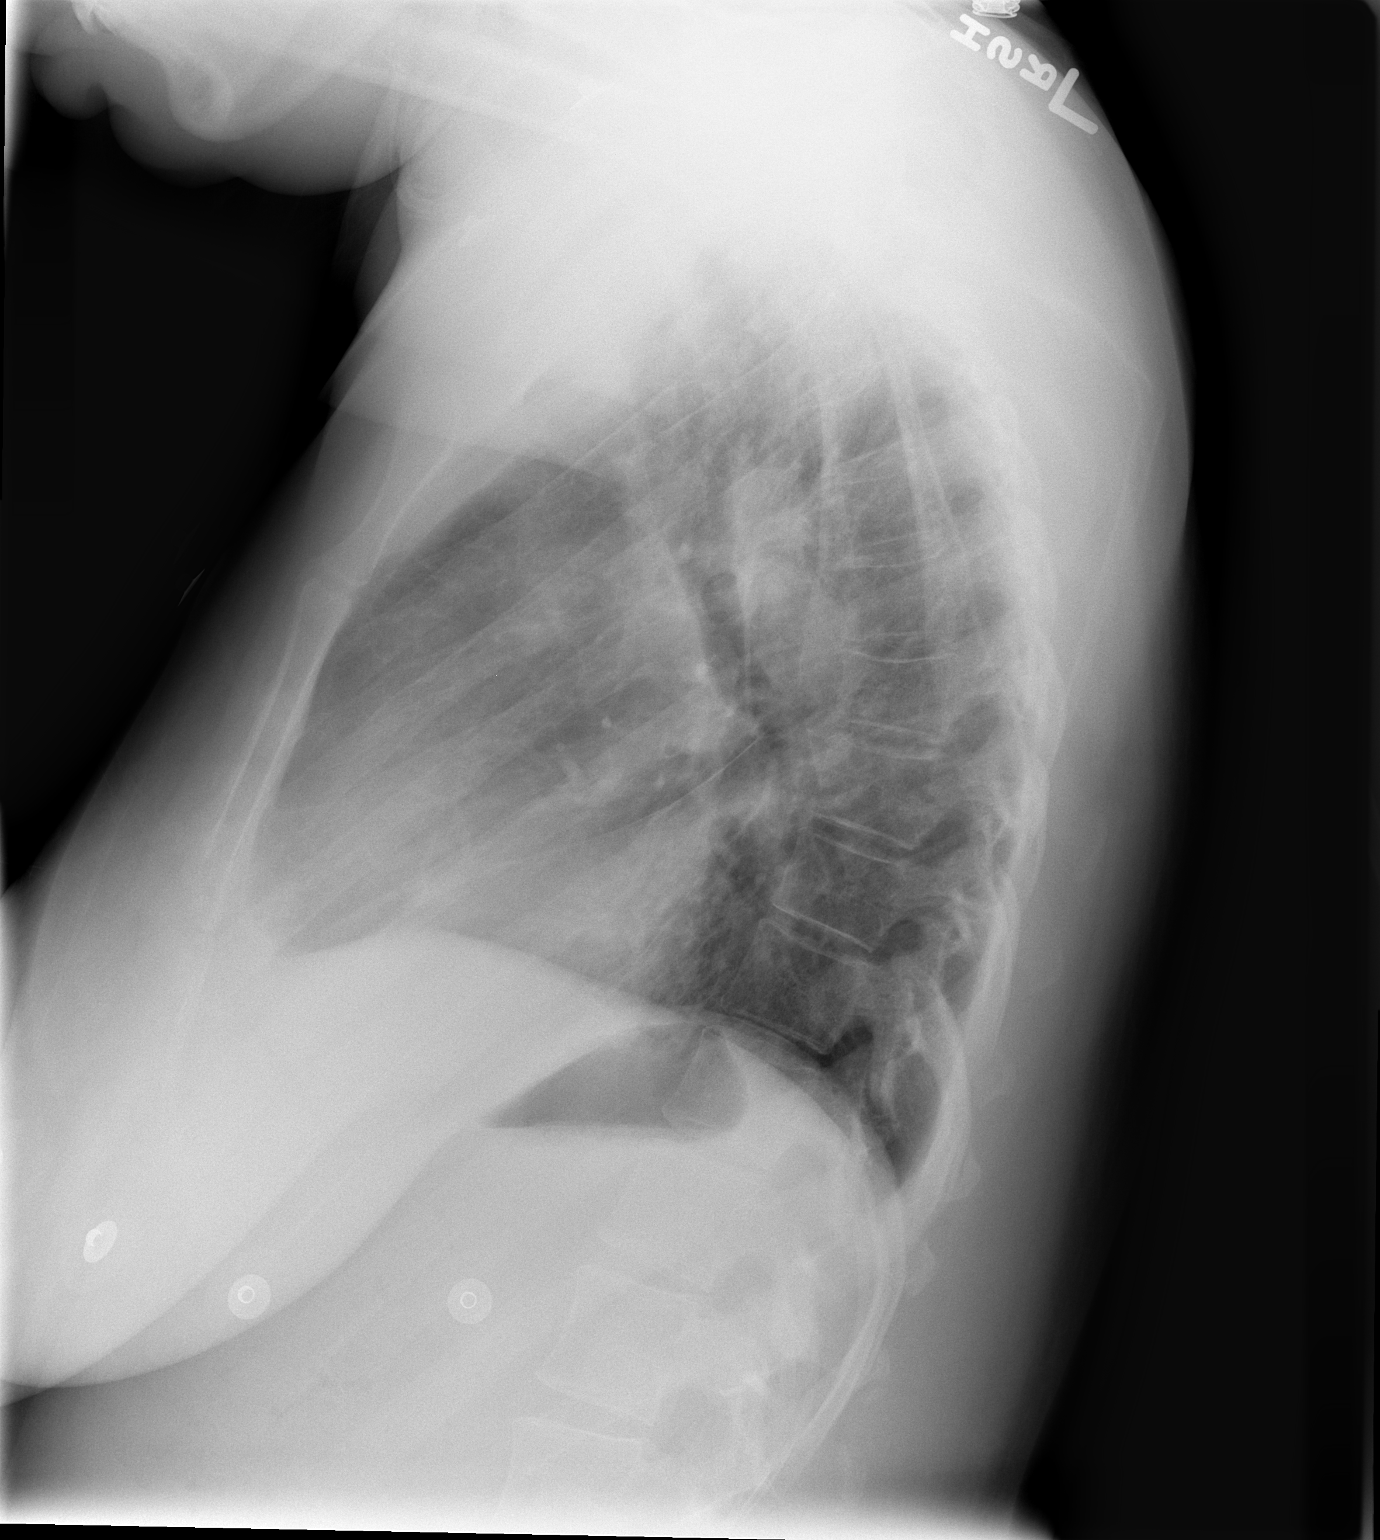

[2 of 2 positions shown; findings below may reference images not displayed]

FINDINGS: Midline trachea.  Mild cardiomegaly.  Pulmonary arterial enlargement.  Left suprahilar opacity corresponds to a dilated pulmonary artery branch.  Similar lung aeration.  No pleural effusion.  No pneumothorax.
IMPRESSION: 1.  Cardiomegaly with pulmonary arterial hypertension.  
 2.  Similar lung aeration.

## 2006-04-05 ENCOUNTER — Ambulatory Visit: Payer: Self-pay | Admitting: Hospitalist

## 2006-04-08 ENCOUNTER — Ambulatory Visit: Payer: Self-pay | Admitting: Hospitalist

## 2006-05-02 ENCOUNTER — Ambulatory Visit: Payer: Self-pay | Admitting: Hospitalist

## 2006-05-09 ENCOUNTER — Ambulatory Visit: Payer: Self-pay | Admitting: Pulmonary Disease

## 2006-05-09 ENCOUNTER — Ambulatory Visit: Payer: Self-pay | Admitting: Internal Medicine

## 2006-05-10 ENCOUNTER — Encounter (INDEPENDENT_AMBULATORY_CARE_PROVIDER_SITE_OTHER): Payer: Self-pay | Admitting: Internal Medicine

## 2006-05-10 DIAGNOSIS — I279 Pulmonary heart disease, unspecified: Secondary | ICD-10-CM | POA: Insufficient documentation

## 2006-05-16 ENCOUNTER — Ambulatory Visit: Payer: Self-pay | Admitting: Internal Medicine

## 2006-05-23 ENCOUNTER — Ambulatory Visit: Payer: Self-pay | Admitting: Internal Medicine

## 2006-06-06 ENCOUNTER — Ambulatory Visit: Payer: Self-pay | Admitting: Internal Medicine

## 2006-06-27 ENCOUNTER — Ambulatory Visit: Payer: Self-pay | Admitting: Hospitalist

## 2006-07-14 ENCOUNTER — Ambulatory Visit: Payer: Self-pay | Admitting: *Deleted

## 2006-07-20 DIAGNOSIS — D6859 Other primary thrombophilia: Secondary | ICD-10-CM | POA: Insufficient documentation

## 2006-08-01 ENCOUNTER — Ambulatory Visit: Payer: Self-pay | Admitting: Internal Medicine

## 2006-08-22 ENCOUNTER — Ambulatory Visit: Payer: Self-pay | Admitting: Internal Medicine

## 2006-09-12 ENCOUNTER — Ambulatory Visit: Payer: Self-pay | Admitting: Hospitalist

## 2006-10-03 ENCOUNTER — Ambulatory Visit: Payer: Self-pay | Admitting: *Deleted

## 2006-10-31 ENCOUNTER — Ambulatory Visit: Payer: Self-pay | Admitting: *Deleted

## 2006-10-31 ENCOUNTER — Encounter (INDEPENDENT_AMBULATORY_CARE_PROVIDER_SITE_OTHER): Payer: Self-pay | Admitting: Pharmacist

## 2006-10-31 LAB — CONVERTED CEMR LAB: INR: 4.3

## 2006-11-21 ENCOUNTER — Ambulatory Visit: Payer: Self-pay | Admitting: Internal Medicine

## 2006-11-21 LAB — CONVERTED CEMR LAB: INR: 1.8

## 2006-11-29 ENCOUNTER — Emergency Department (HOSPITAL_COMMUNITY): Admission: EM | Admit: 2006-11-29 | Discharge: 2006-11-29 | Payer: Self-pay | Admitting: Emergency Medicine

## 2006-11-29 IMAGING — CR DG CHEST 1V PORT
1 series · 1 of 1 positions shown · non-contrast
Comparison: [DATE].

CLINICAL DATA: Chest pain and dyspnea.  
 PORTABLE CHEST - 1 VIEW - [DATE] AT [LS] HOURS:

[view not recorded]
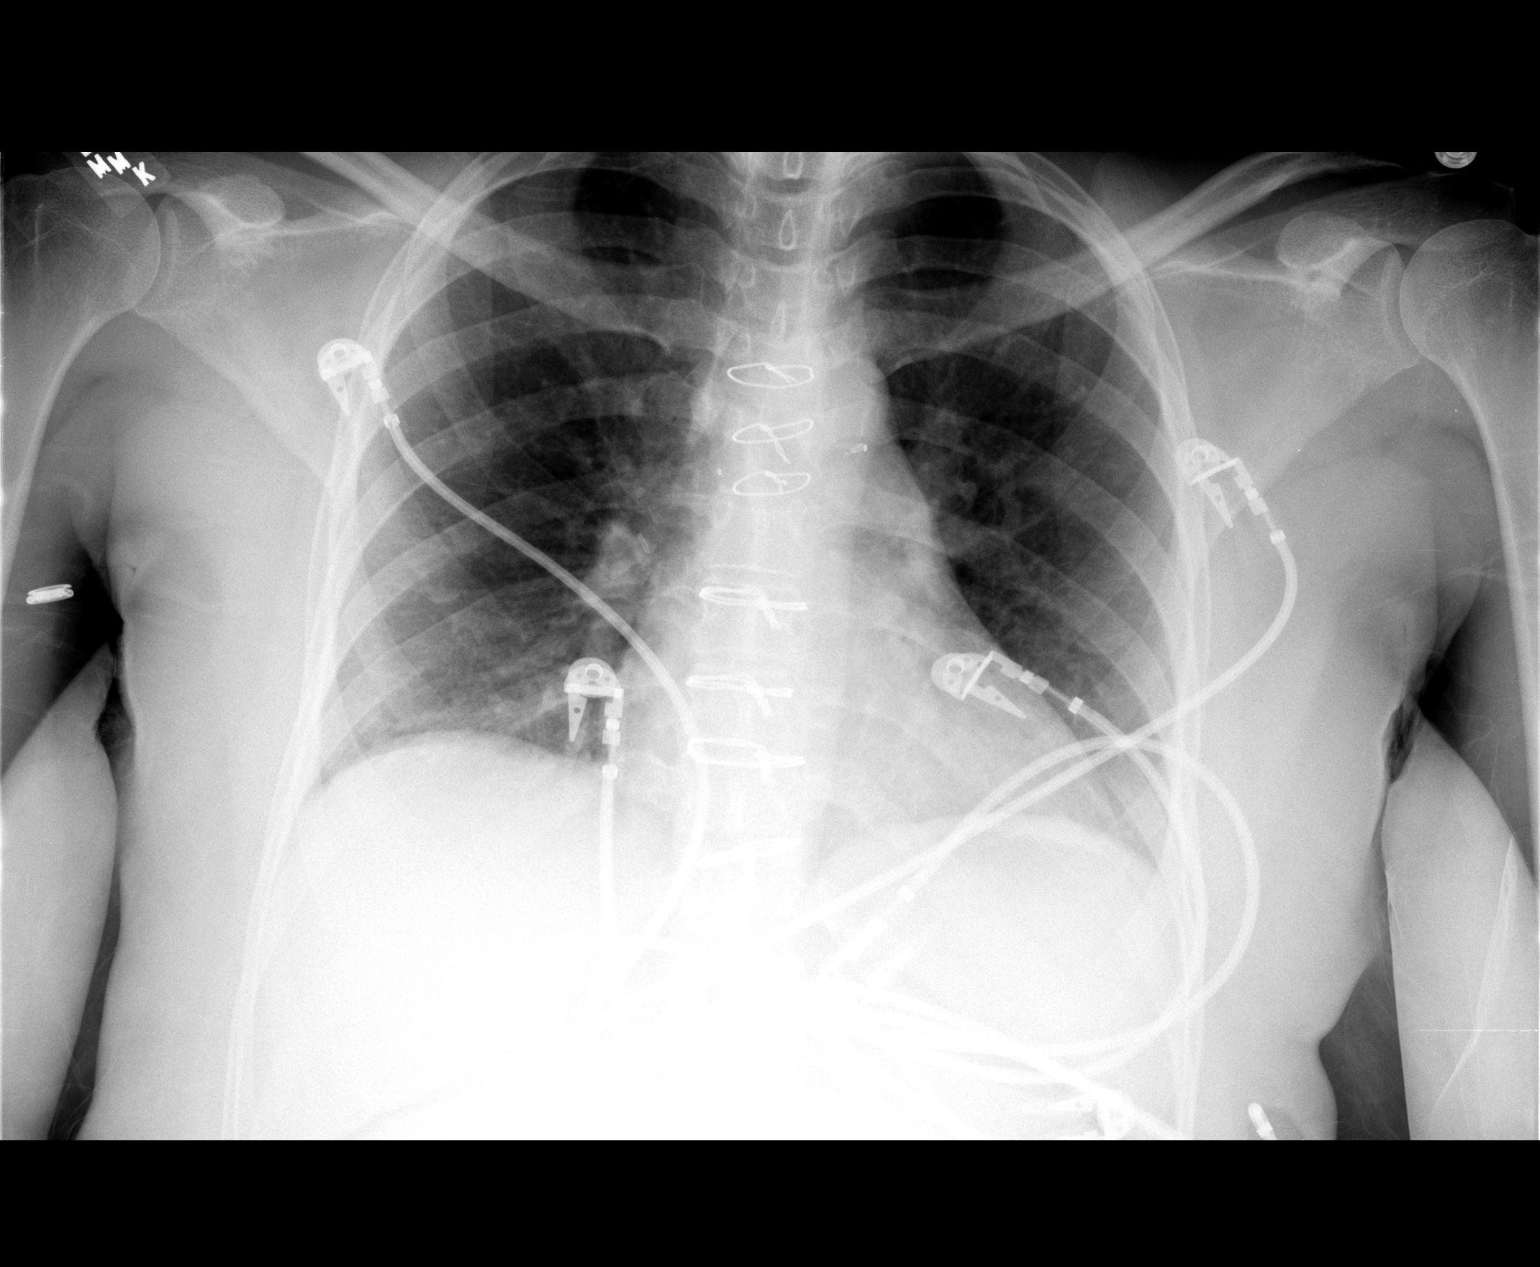

[1 of 1 positions shown; findings below may reference images not displayed]

FINDINGS: The patient has undergone interval median sternotomy.  The heart size is now smaller and there is less central enlargement of the pulmonary arteries.  No edema, confluent air space opacity, or pleural effusion is present. There are multiple overlying EKG leads.  No acute osseous findings are seen.
IMPRESSION: Interval improvement in cardiomegaly and central pulmonary arterial enlargement status post median sternotomy and presumed open heart surgery.  No acute chest findings.

## 2006-12-05 ENCOUNTER — Ambulatory Visit: Payer: Self-pay | Admitting: *Deleted

## 2006-12-05 LAB — CONVERTED CEMR LAB: INR: 2.7

## 2007-01-02 ENCOUNTER — Ambulatory Visit: Payer: Self-pay | Admitting: Internal Medicine

## 2007-02-06 ENCOUNTER — Ambulatory Visit: Payer: Self-pay | Admitting: Hospitalist

## 2007-02-20 ENCOUNTER — Ambulatory Visit: Payer: Self-pay | Admitting: Hospitalist

## 2007-02-20 LAB — CONVERTED CEMR LAB: INR: 4.5

## 2007-04-17 ENCOUNTER — Ambulatory Visit: Payer: Self-pay | Admitting: Internal Medicine

## 2007-04-17 LAB — CONVERTED CEMR LAB: INR: 2.1

## 2007-05-15 ENCOUNTER — Ambulatory Visit: Payer: Self-pay | Admitting: Infectious Diseases

## 2007-05-15 LAB — CONVERTED CEMR LAB: INR: 2.1

## 2007-06-12 ENCOUNTER — Ambulatory Visit: Payer: Self-pay | Admitting: Internal Medicine

## 2007-06-12 LAB — CONVERTED CEMR LAB

## 2007-06-17 ENCOUNTER — Emergency Department (HOSPITAL_COMMUNITY): Admission: EM | Admit: 2007-06-17 | Discharge: 2007-06-17 | Payer: Self-pay | Admitting: Emergency Medicine

## 2007-06-17 IMAGING — CR DG CHEST 1V PORT
1 series · 1 of 1 positions shown · non-contrast
Comparison: [DATE]

CLINICAL DATA: 36-year-old with chest pain.
 PORTABLE CHEST:

[AP]
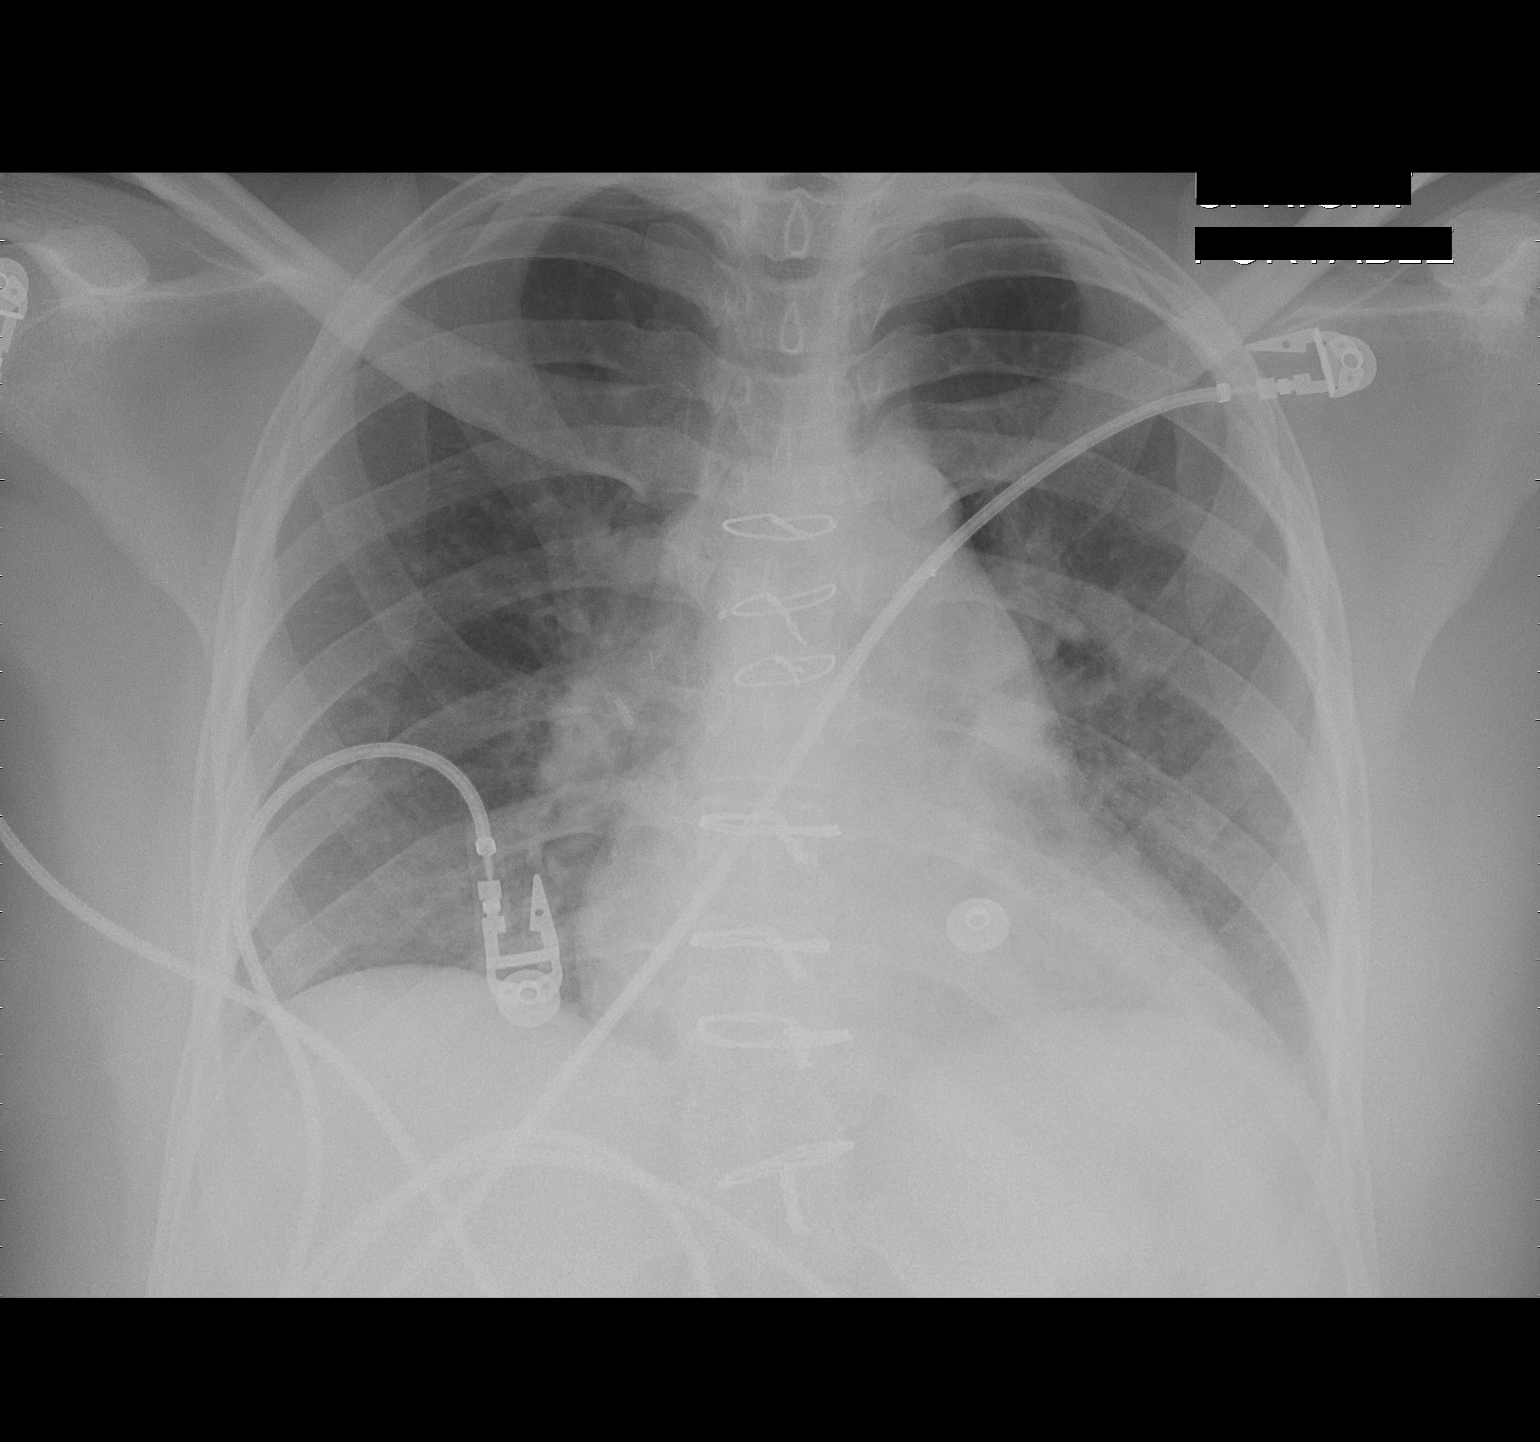

[1 of 1 positions shown; findings below may reference images not displayed]

FINDINGS: Heart is mildly enlarged.  There are surgical changes from previous sternotomy.  Low lung volumes with vascular crowding and atelectasis.  There is also central vascular congestion without overt pulmonary edema.  No pleural effusions.  No focal infiltrates.  Bony structures are grossly normal.
IMPRESSION: Low      volume chest film with vascular crowding and atelectasis.
  Vascular      congestion and mild cardiac enlargement without pulmonary edema or      effusion.

## 2007-07-03 ENCOUNTER — Ambulatory Visit: Payer: Self-pay | Admitting: Hospitalist

## 2007-07-31 ENCOUNTER — Ambulatory Visit: Payer: Self-pay | Admitting: Internal Medicine

## 2007-08-14 ENCOUNTER — Ambulatory Visit: Payer: Self-pay | Admitting: Internal Medicine

## 2007-08-14 LAB — CONVERTED CEMR LAB: INR: 3.4

## 2007-09-11 ENCOUNTER — Ambulatory Visit: Payer: Self-pay | Admitting: Hospitalist

## 2007-09-11 LAB — CONVERTED CEMR LAB

## 2007-10-09 ENCOUNTER — Ambulatory Visit: Payer: Self-pay | Admitting: Infectious Diseases

## 2007-10-09 LAB — CONVERTED CEMR LAB: INR: 3.4

## 2007-10-23 ENCOUNTER — Ambulatory Visit: Payer: Self-pay | Admitting: *Deleted

## 2007-10-23 DIAGNOSIS — M545 Low back pain, unspecified: Secondary | ICD-10-CM | POA: Insufficient documentation

## 2007-10-23 DIAGNOSIS — B353 Tinea pedis: Secondary | ICD-10-CM | POA: Insufficient documentation

## 2007-11-06 ENCOUNTER — Ambulatory Visit: Payer: Self-pay | Admitting: Internal Medicine

## 2007-11-27 ENCOUNTER — Ambulatory Visit: Payer: Self-pay | Admitting: Hospitalist

## 2007-11-27 ENCOUNTER — Encounter: Payer: Self-pay | Admitting: Pharmacist

## 2007-11-27 LAB — CONVERTED CEMR LAB

## 2007-12-23 ENCOUNTER — Emergency Department (HOSPITAL_COMMUNITY): Admission: EM | Admit: 2007-12-23 | Discharge: 2007-12-24 | Payer: Self-pay | Admitting: Emergency Medicine

## 2007-12-24 IMAGING — CR DG CHEST 2V
2 series · 2 of 2 positions shown · non-contrast
Comparison: [DATE]

CLINICAL DATA: Chest pain.  Anxiety.

CHEST - 2 VIEW

[w chest pa]
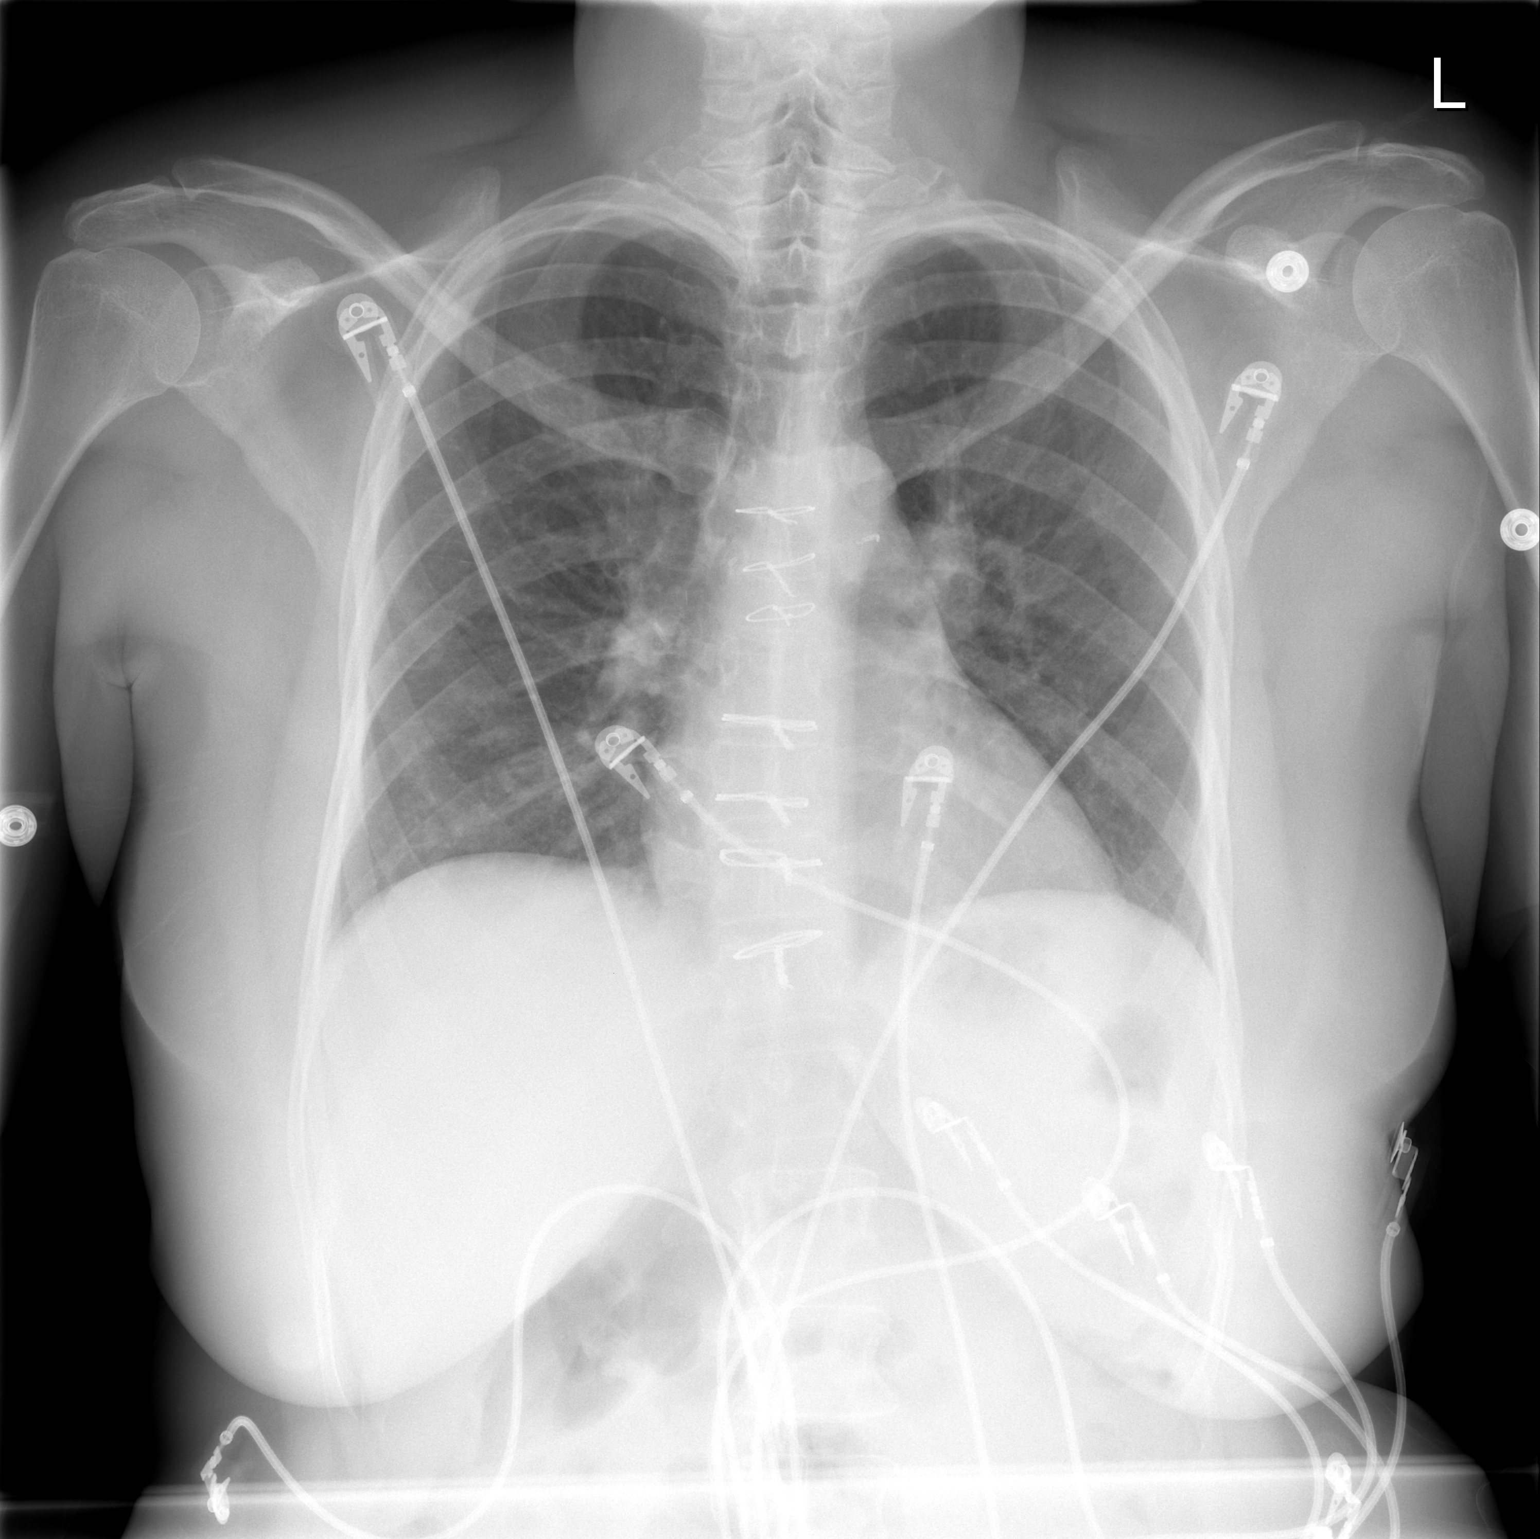

[w chest lat]
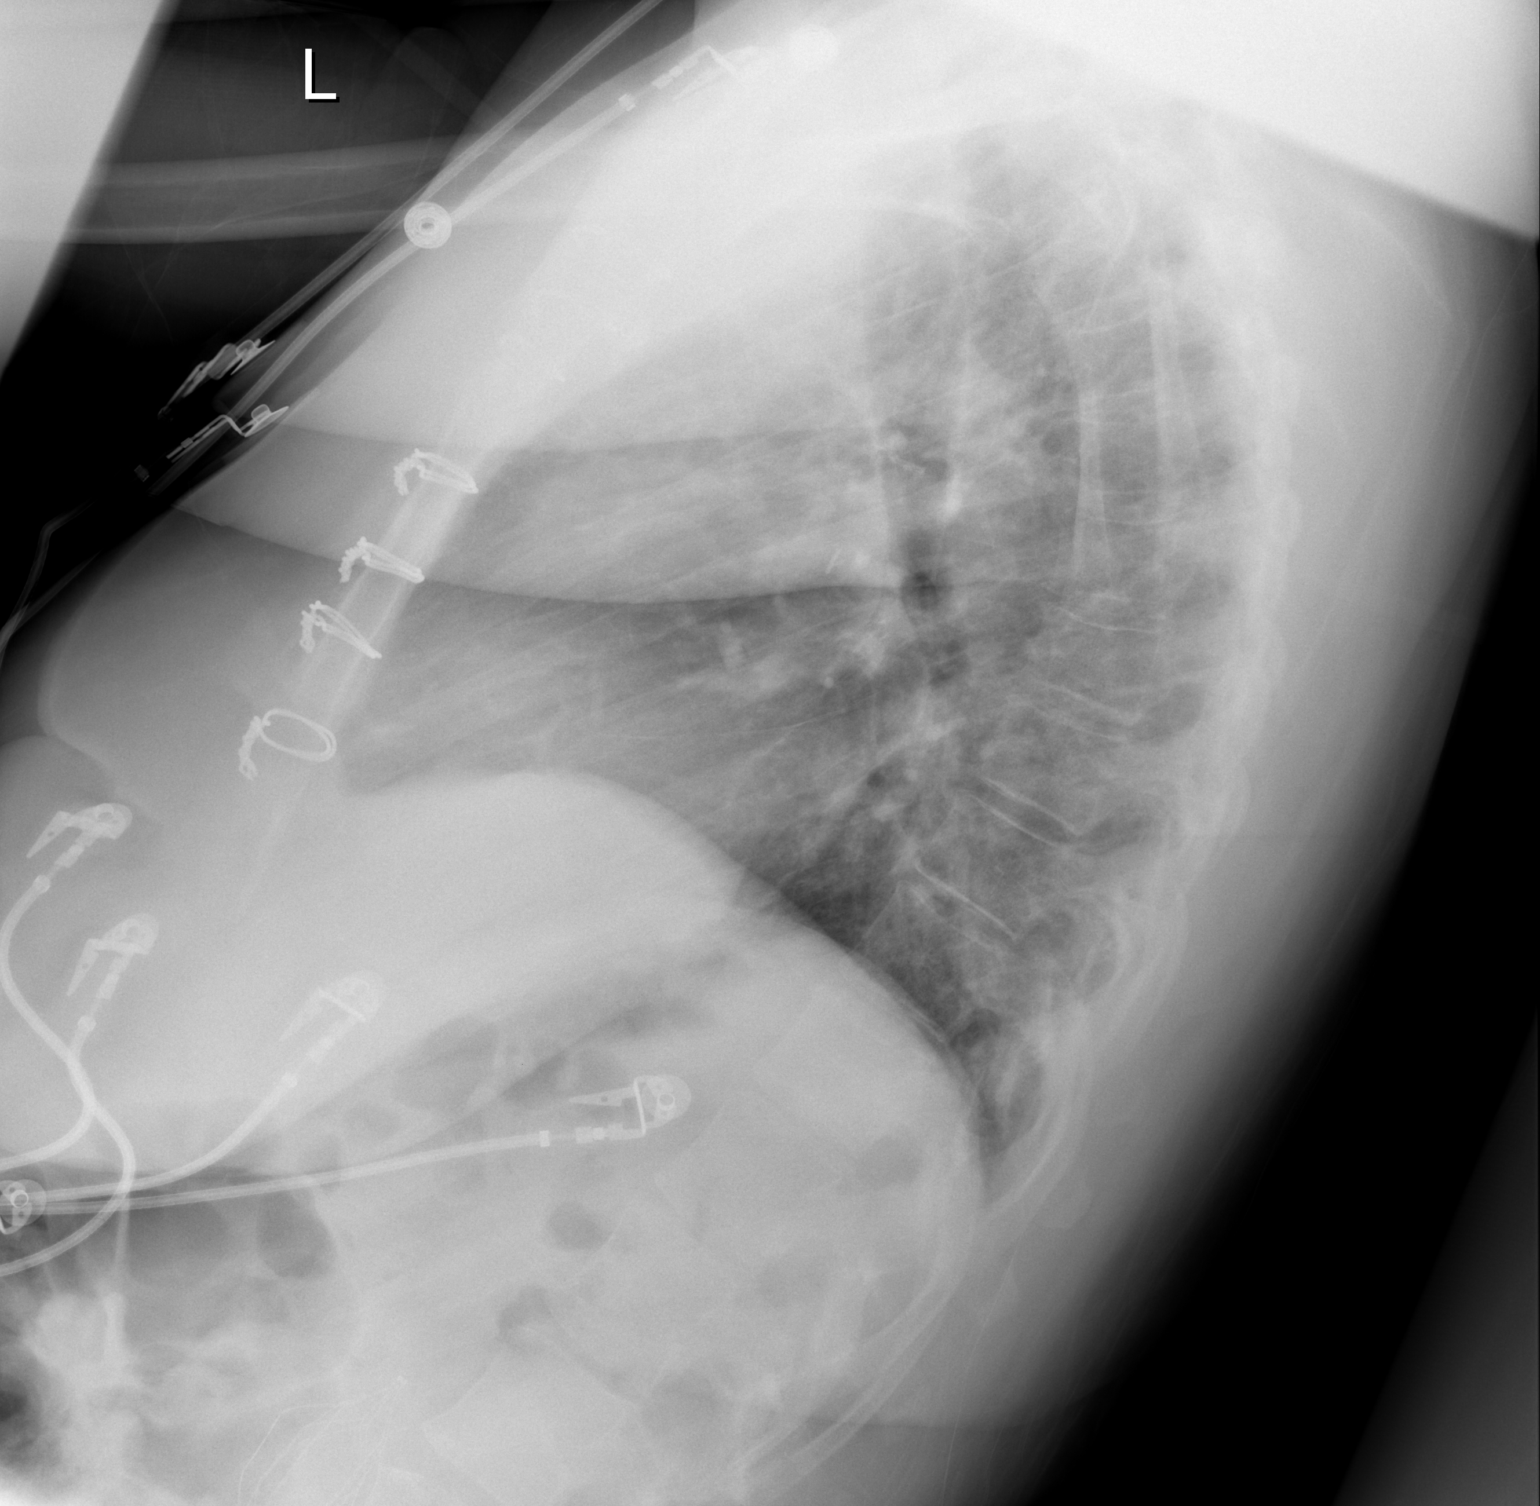

[2 of 2 positions shown; findings below may reference images not displayed]

FINDINGS: The heart size and mediastinal contours are within normal
limits.  Both lungs are clear.  The visualized skeletal structures
are unremarkable.The patient has undergone previous coronary bypass
grafting.
IMPRESSION: No active cardiopulmonary disease.

## 2008-01-01 ENCOUNTER — Ambulatory Visit: Payer: Self-pay | Admitting: Internal Medicine

## 2008-01-01 ENCOUNTER — Encounter: Payer: Self-pay | Admitting: Pharmacist

## 2008-01-01 LAB — CONVERTED CEMR LAB: INR: 3.5

## 2008-02-05 ENCOUNTER — Ambulatory Visit: Payer: Self-pay | Admitting: Internal Medicine

## 2008-02-05 LAB — CONVERTED CEMR LAB: INR: 4

## 2008-03-04 ENCOUNTER — Ambulatory Visit: Payer: Self-pay | Admitting: Infectious Diseases

## 2008-03-04 LAB — CONVERTED CEMR LAB: INR: 3.7

## 2008-04-22 ENCOUNTER — Ambulatory Visit: Payer: Self-pay | Admitting: Internal Medicine

## 2008-04-22 LAB — CONVERTED CEMR LAB

## 2008-05-20 ENCOUNTER — Ambulatory Visit: Payer: Self-pay | Admitting: Internal Medicine

## 2008-05-20 LAB — CONVERTED CEMR LAB: INR: 3.6

## 2008-06-17 ENCOUNTER — Ambulatory Visit: Payer: Self-pay | Admitting: *Deleted

## 2008-06-17 LAB — CONVERTED CEMR LAB

## 2008-07-12 ENCOUNTER — Ambulatory Visit: Payer: Self-pay | Admitting: *Deleted

## 2008-07-15 ENCOUNTER — Encounter: Payer: Self-pay | Admitting: Internal Medicine

## 2008-07-15 ENCOUNTER — Encounter (INDEPENDENT_AMBULATORY_CARE_PROVIDER_SITE_OTHER): Payer: Self-pay | Admitting: *Deleted

## 2008-07-15 ENCOUNTER — Ambulatory Visit: Payer: Self-pay | Admitting: Internal Medicine

## 2008-07-15 ENCOUNTER — Ambulatory Visit (HOSPITAL_COMMUNITY): Admission: RE | Admit: 2008-07-15 | Discharge: 2008-07-15 | Payer: Self-pay | Admitting: Internal Medicine

## 2008-07-15 DIAGNOSIS — R51 Headache: Secondary | ICD-10-CM | POA: Insufficient documentation

## 2008-07-15 DIAGNOSIS — M79609 Pain in unspecified limb: Secondary | ICD-10-CM | POA: Insufficient documentation

## 2008-07-15 DIAGNOSIS — L293 Anogenital pruritus, unspecified: Secondary | ICD-10-CM | POA: Insufficient documentation

## 2008-07-15 DIAGNOSIS — R519 Headache, unspecified: Secondary | ICD-10-CM | POA: Insufficient documentation

## 2008-07-15 LAB — CONVERTED CEMR LAB
AST: 17 units/L (ref 0–37)
Alkaline Phosphatase: 86 units/L (ref 39–117)
BUN: 13 mg/dL (ref 6–23)
Basophils Relative: 1 % (ref 0–1)
Beta hcg, urine, semiquantitative: NEGATIVE
Creatinine, Ser: 0.78 mg/dL (ref 0.40–1.20)
Eosinophils Absolute: 0.1 10*3/uL (ref 0.0–0.7)
Eosinophils Relative: 2 % (ref 0–5)
Gardnerella vaginalis: POSITIVE — AB
Glucose, Bld: 93 mg/dL (ref 70–99)
HCT: 40.6 % (ref 36.0–46.0)
INR: 4.4
Lead-Whole Blood: 2.6 ug/dL (ref <10)
Leukocytes, UA: NEGATIVE
Lymphs Abs: 1.8 10*3/uL (ref 0.7–4.0)
MCHC: 33.5 g/dL (ref 30.0–36.0)
MCV: 85.3 fL (ref 78.0–100.0)
Monocytes Relative: 14 % — ABNORMAL HIGH (ref 3–12)
Protein, ur: NEGATIVE mg/dL
RBC: 4.76 M/uL (ref 3.87–5.11)
Specific Gravity, Urine: 1.015 (ref 1.005–1.03)
Total Bilirubin: 0.4 mg/dL (ref 0.3–1.2)
Trichomonal Vaginitis: NEGATIVE
Urine Glucose: NEGATIVE mg/dL
WBC: 5.1 10*3/uL (ref 4.0–10.5)
pH: 5.5 (ref 5.0–8.0)

## 2008-07-17 ENCOUNTER — Ambulatory Visit (HOSPITAL_COMMUNITY): Admission: RE | Admit: 2008-07-17 | Discharge: 2008-07-17 | Payer: Self-pay | Admitting: Internal Medicine

## 2008-07-17 ENCOUNTER — Ambulatory Visit: Payer: Self-pay | Admitting: Cardiovascular Disease

## 2008-07-17 ENCOUNTER — Encounter (INDEPENDENT_AMBULATORY_CARE_PROVIDER_SITE_OTHER): Payer: Self-pay | Admitting: Internal Medicine

## 2008-07-17 ENCOUNTER — Encounter: Payer: Self-pay | Admitting: Internal Medicine

## 2008-07-19 ENCOUNTER — Ambulatory Visit: Payer: Self-pay | Admitting: Internal Medicine

## 2008-08-12 ENCOUNTER — Ambulatory Visit: Payer: Self-pay | Admitting: Internal Medicine

## 2008-08-12 LAB — CONVERTED CEMR LAB

## 2008-08-21 ENCOUNTER — Encounter: Payer: Self-pay | Admitting: Internal Medicine

## 2008-08-21 ENCOUNTER — Ambulatory Visit: Payer: Self-pay | Admitting: Internal Medicine

## 2008-08-26 ENCOUNTER — Telehealth: Payer: Self-pay | Admitting: *Deleted

## 2008-09-02 ENCOUNTER — Ambulatory Visit (HOSPITAL_COMMUNITY): Admission: RE | Admit: 2008-09-02 | Discharge: 2008-09-02 | Payer: Self-pay | Admitting: Internal Medicine

## 2008-09-02 IMAGING — US US TRANSVAGINAL NON-OB
1 series · 14 of 25 positions shown · non-contrast
Comparison: None

CLINICAL DATA: Evaluate pelvic cyst

TRANSABDOMINAL AND TRANSVAGINAL ULTRASOUND OF PELVIS
TECHNIQUE: Both transabdominal and transvaginal ultrasound
examinations of the pelvis were performed including evaluation of
the uterus, ovaries, adnexal regions, and pelvic cul-de-sac.

[Series 1: us transvaginal non-ob · 0.26mm/px · 14 of 69 slices shown]
[im 1/69]
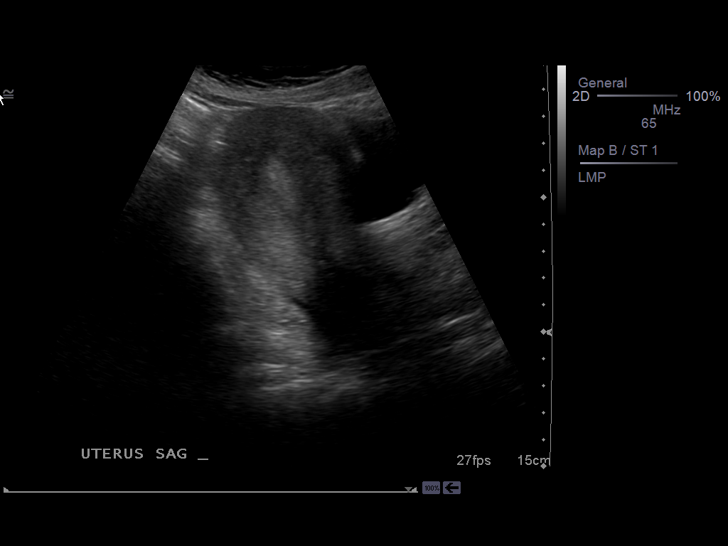
[im 6/69]
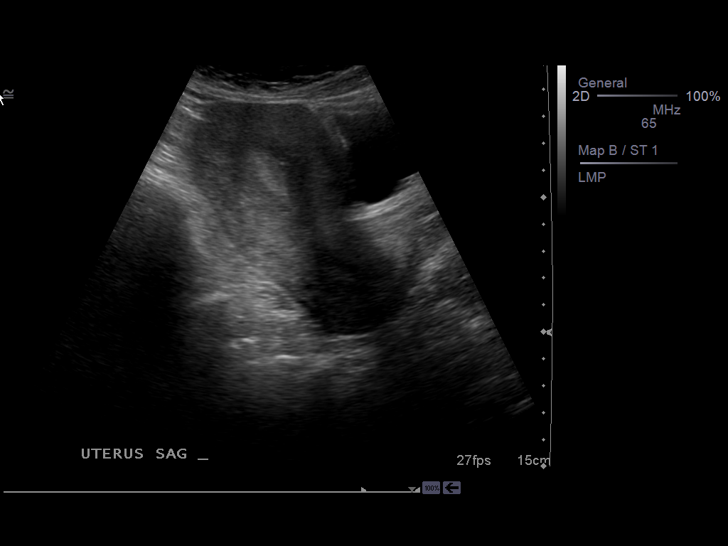
[im 12/69]
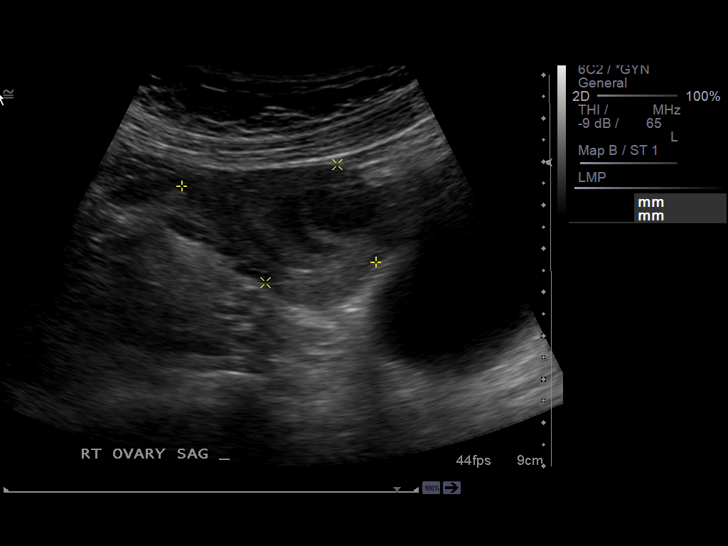
[im 18/69]
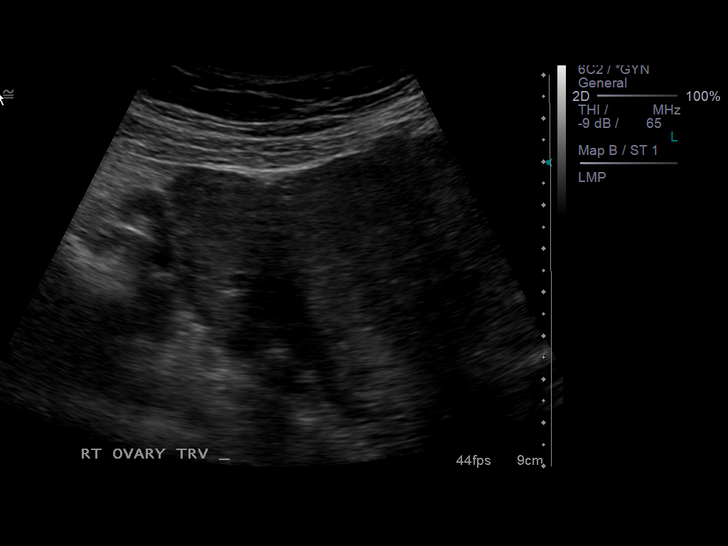
[im 23/69]
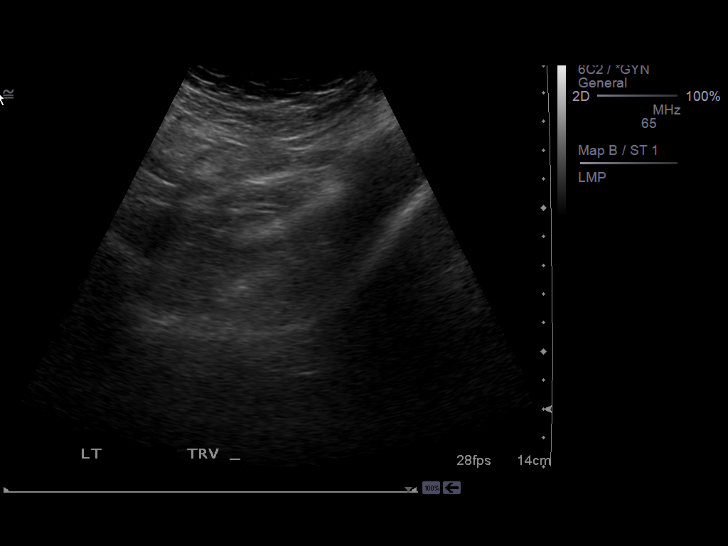
[im 26/69]
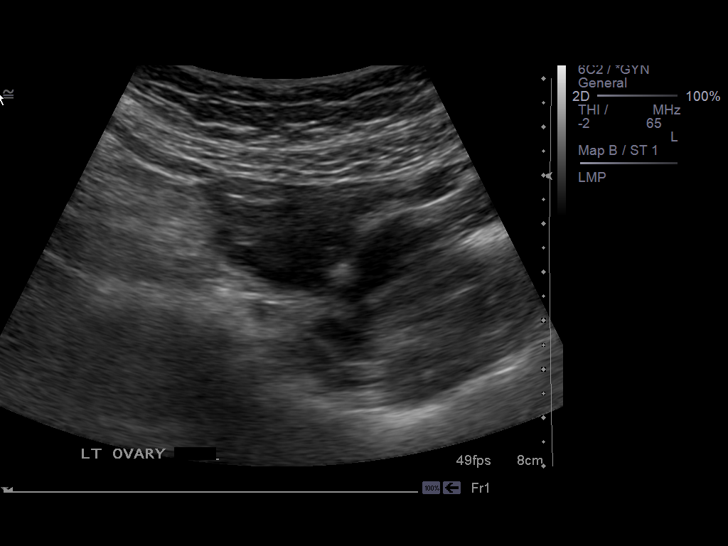
[im 32/69]
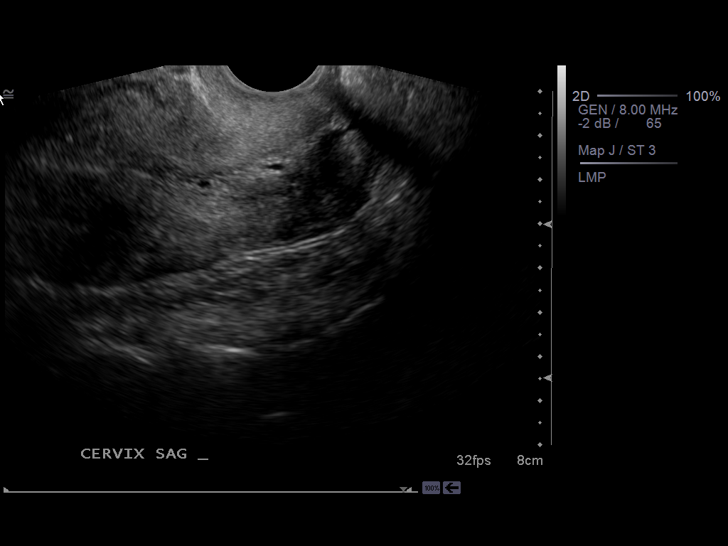
[im 37/69]
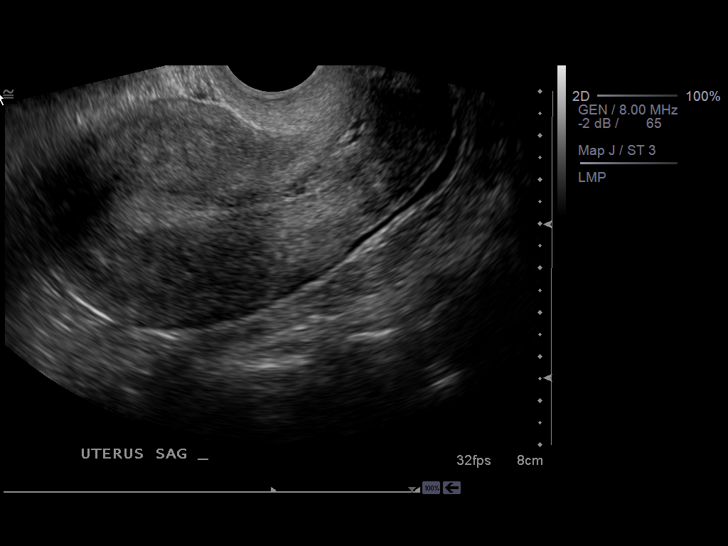
[im 43/69]
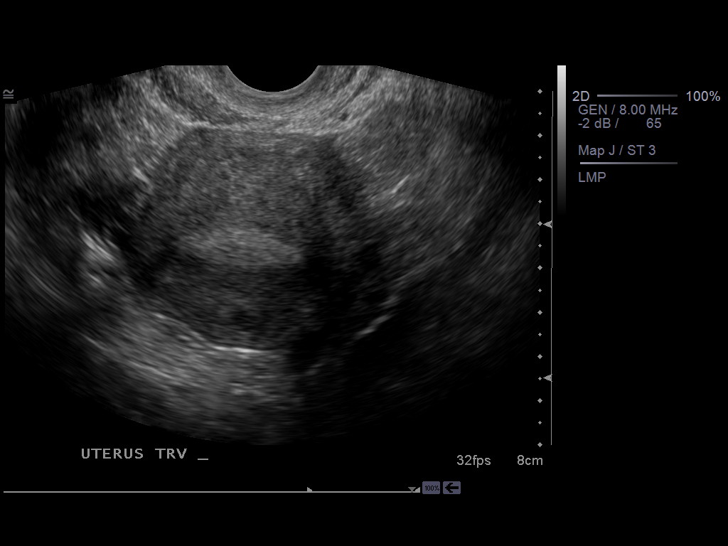
[im 46/69]
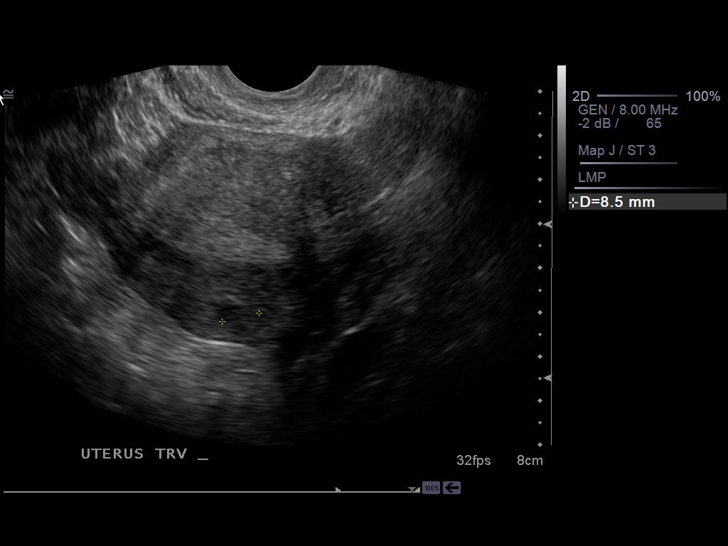
[im 52/69]
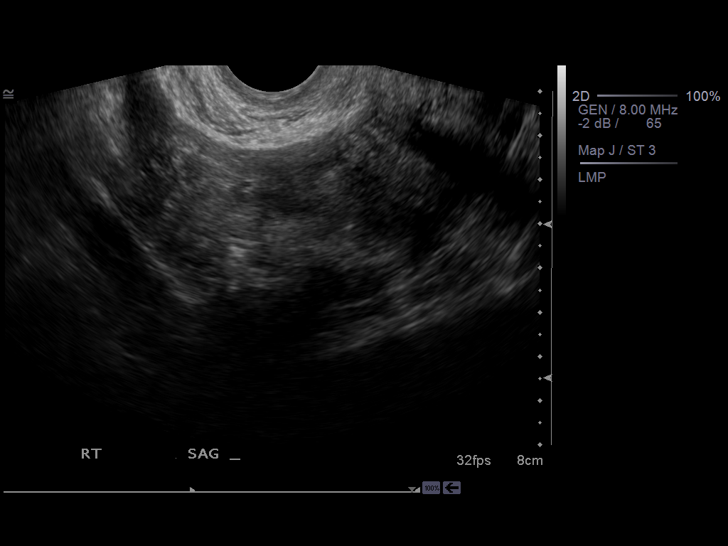
[im 57/69]
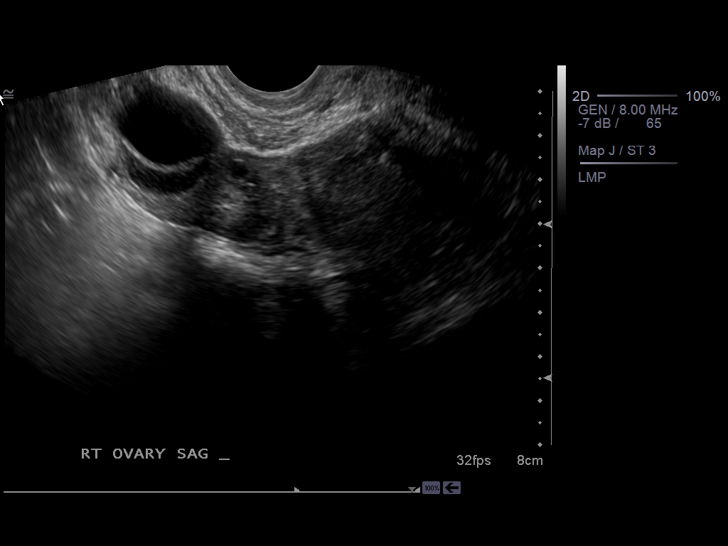
[im 63/69]
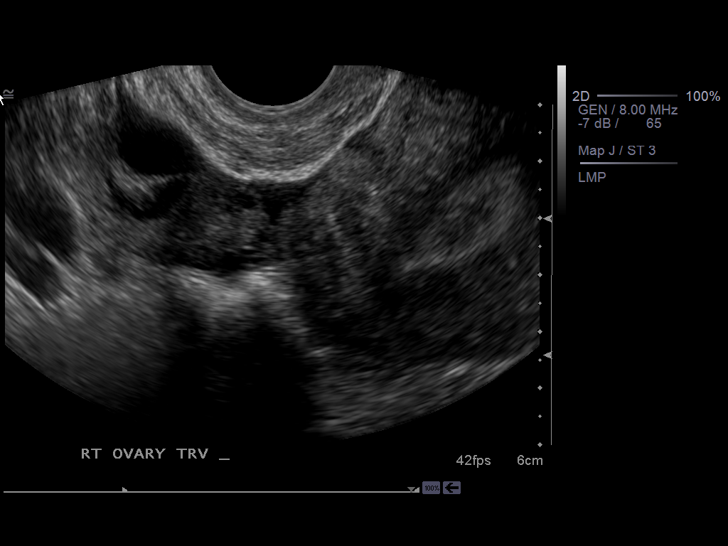
[im 69/69]
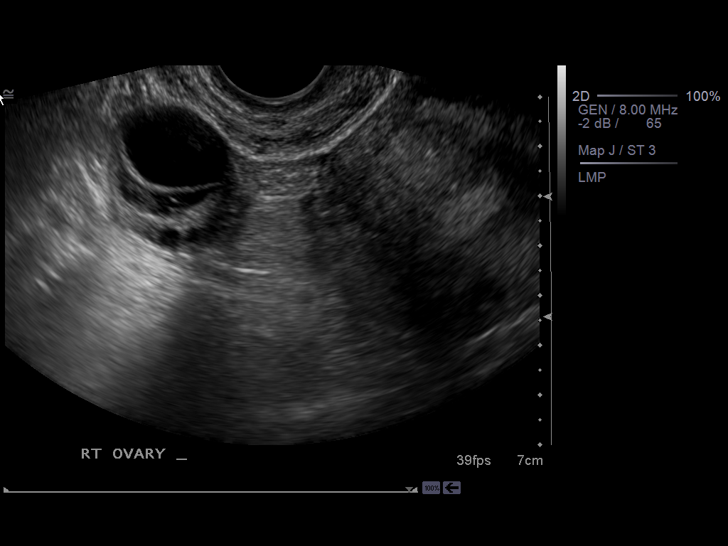

[14 of 25 positions shown; findings below may reference images not displayed]

FINDINGS: The uterus measures 9.8 x 5.1 x 5.8 cm.

Within the posterior wall of the uterus there is a well-
circumscribed hypo echoic nodule measuring 1.2 x 0.8 x 0.9 cm.

The endometrium appears normal measuring 10 mm.

The left ovary appears normal

The right ovary is asymmetrically enlarged measuring 4.8 x 3.0 x
3.2 cm.

There is a complex cyst within the right ovary measuring 2.4 x
x 1.7 cm. This cystic structure appears septated containing a large
anechoic component and a second component which has internal
echoes.
IMPRESSION: 1.  There is a complex cyst within the right ovary.  This is
indeterminate.  Differential considerations include hemorrhagic
cyst or collapsing cyst. Follow-up examination after two cycles is
advised to ensure resolution.

## 2008-09-16 ENCOUNTER — Ambulatory Visit: Payer: Self-pay | Admitting: *Deleted

## 2008-09-16 LAB — CONVERTED CEMR LAB: INR: 2.7

## 2008-09-19 ENCOUNTER — Telehealth: Payer: Self-pay | Admitting: Internal Medicine

## 2008-10-14 ENCOUNTER — Ambulatory Visit: Payer: Self-pay | Admitting: Internal Medicine

## 2008-10-14 LAB — CONVERTED CEMR LAB: INR: 2.7

## 2008-10-22 ENCOUNTER — Encounter (INDEPENDENT_AMBULATORY_CARE_PROVIDER_SITE_OTHER): Payer: Self-pay | Admitting: Internal Medicine

## 2008-10-22 ENCOUNTER — Ambulatory Visit: Payer: Self-pay | Admitting: Internal Medicine

## 2008-10-22 ENCOUNTER — Telehealth (INDEPENDENT_AMBULATORY_CARE_PROVIDER_SITE_OTHER): Payer: Self-pay | Admitting: Pharmacist

## 2008-10-22 LAB — CONVERTED CEMR LAB
ALT: 14 units/L (ref 0–35)
AST: 21 units/L (ref 0–37)
Albumin: 3.8 g/dL (ref 3.5–5.2)
Beta hcg, urine, semiquantitative: POSITIVE
Calcium: 9.4 mg/dL (ref 8.4–10.5)
Chloride: 105 meq/L (ref 96–112)
Hemoglobin: 14.6 g/dL (ref 12.0–15.0)
Platelets: 221 10*3/uL (ref 150–400)
Potassium: 3.9 meq/L (ref 3.5–5.3)
RDW: 14.3 % (ref 11.5–15.5)
Total Protein: 6.9 g/dL (ref 6.0–8.3)

## 2008-10-23 ENCOUNTER — Encounter: Payer: Self-pay | Admitting: Internal Medicine

## 2008-10-24 ENCOUNTER — Inpatient Hospital Stay (HOSPITAL_COMMUNITY): Admission: AD | Admit: 2008-10-24 | Discharge: 2008-10-24 | Payer: Self-pay | Admitting: Obstetrics & Gynecology

## 2008-10-24 IMAGING — US US OB COMP LESS 14 WK
1 of 2 series · 14 of 28 positions shown · non-contrast
Comparison: none

OBSTETRICAL ULTRASOUND:
 This ultrasound exam was performed in the [HOSPITAL] Ultrasound Department.  The OB US report was generated in the AS system, and faxed to the ordering physician.  This report is also available in [REDACTED] PACS.

[Series 1: us ob comp less 14 wks · 14 of 59 slices shown]
[im 1/59]
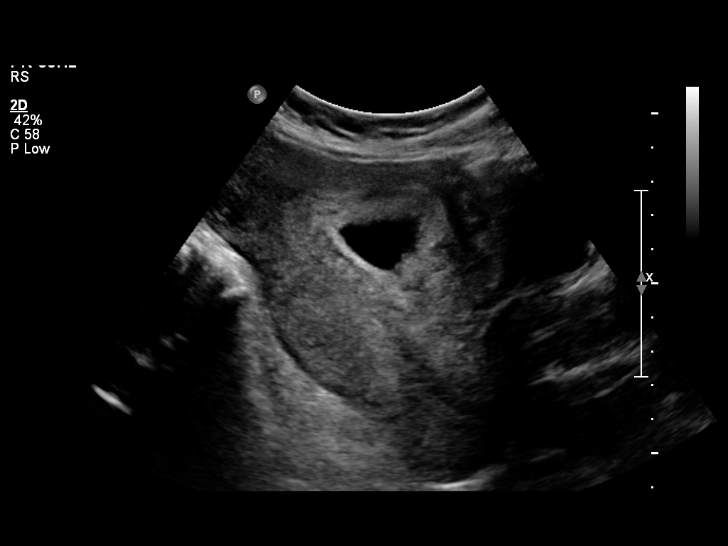
[im 5/59]
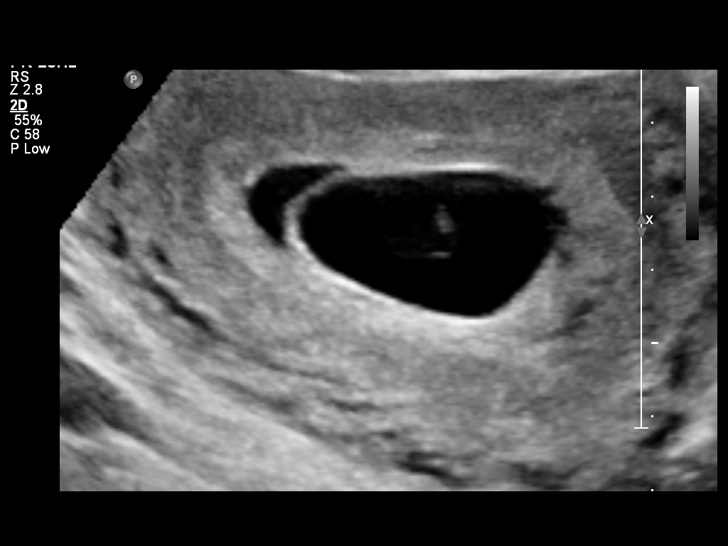
[im 9/59]
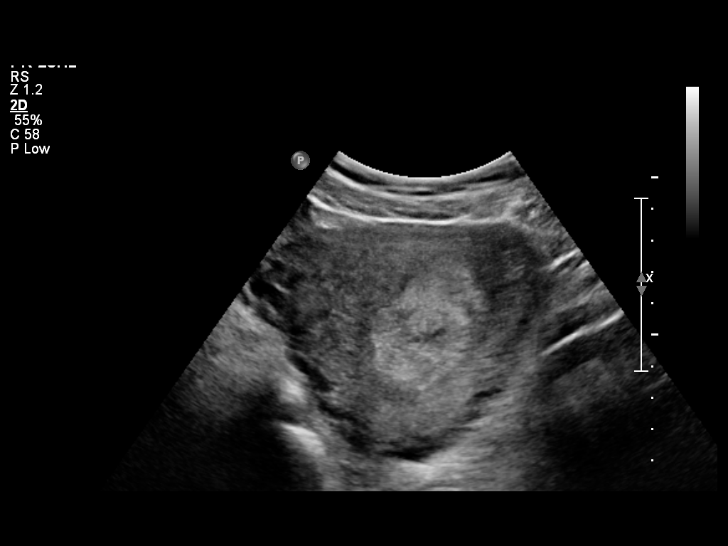
[im 14/59]
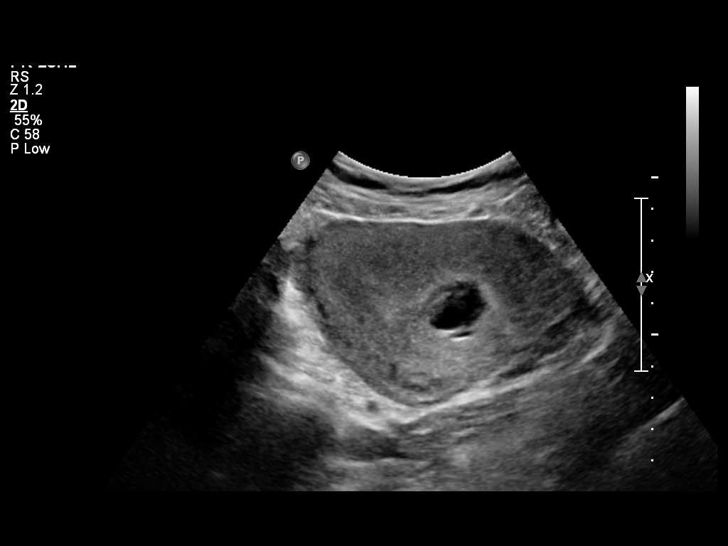
[im 18/59]
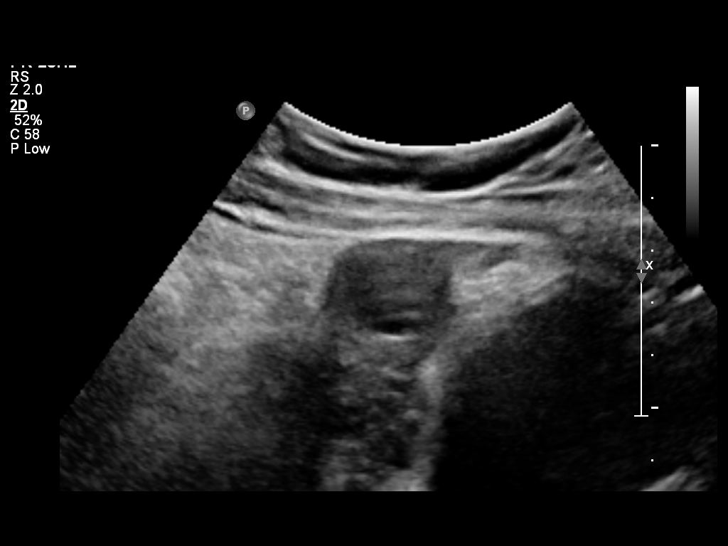
[im 23/59]
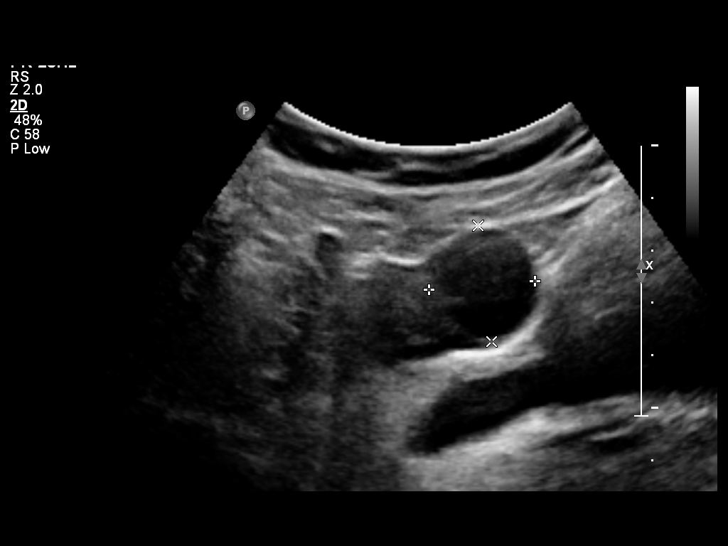
[im 27/59]
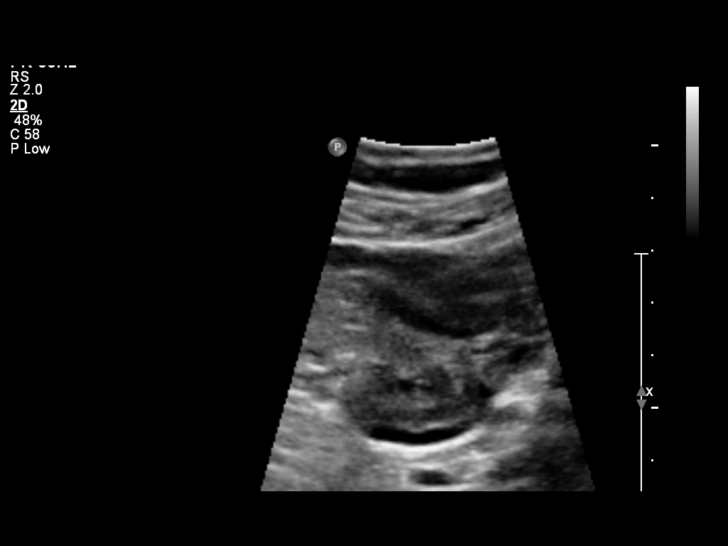
[im 32/59]
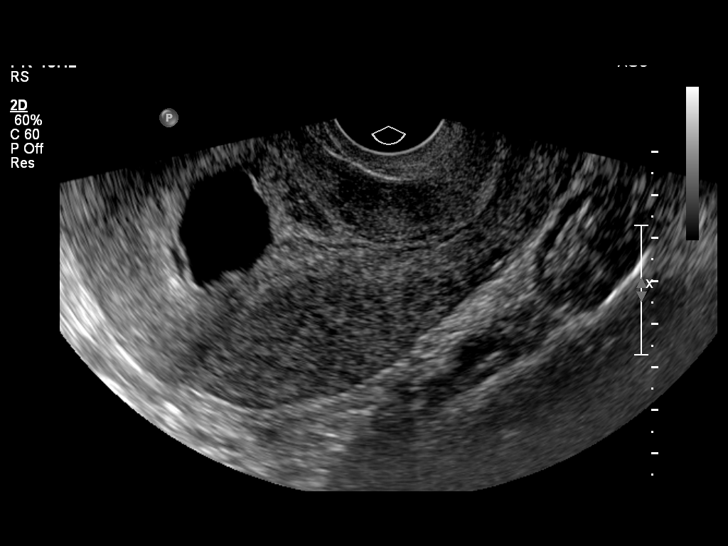
[im 36/59]
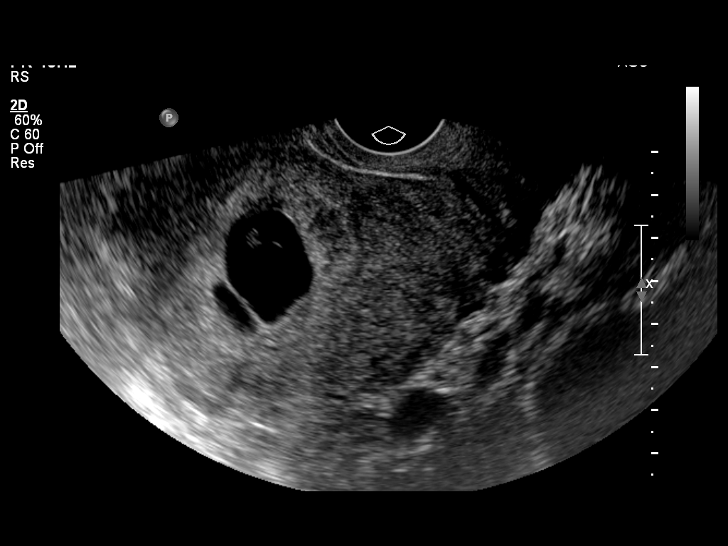
[im 41/59]
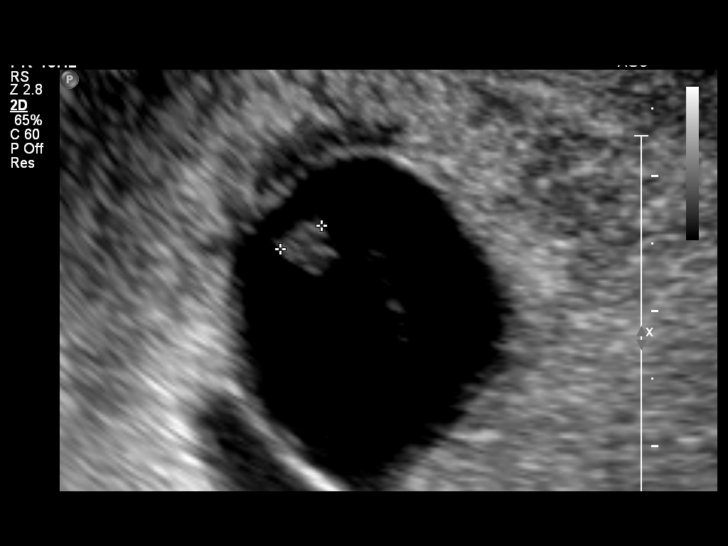
[im 45/59]
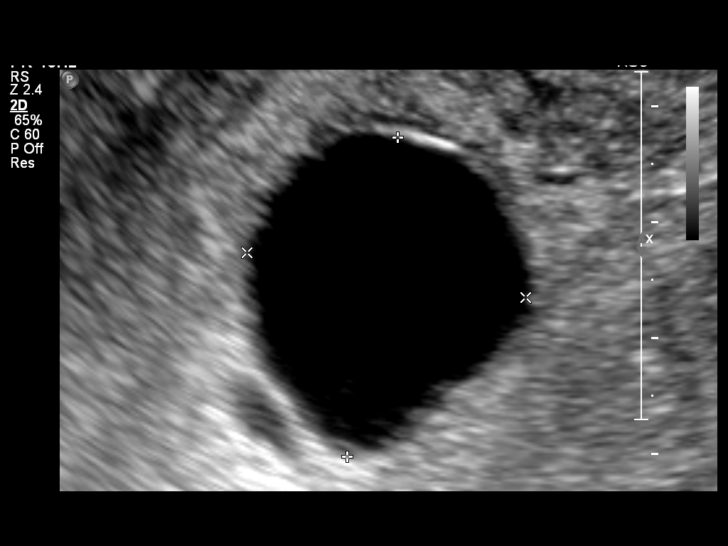
[im 50/59]
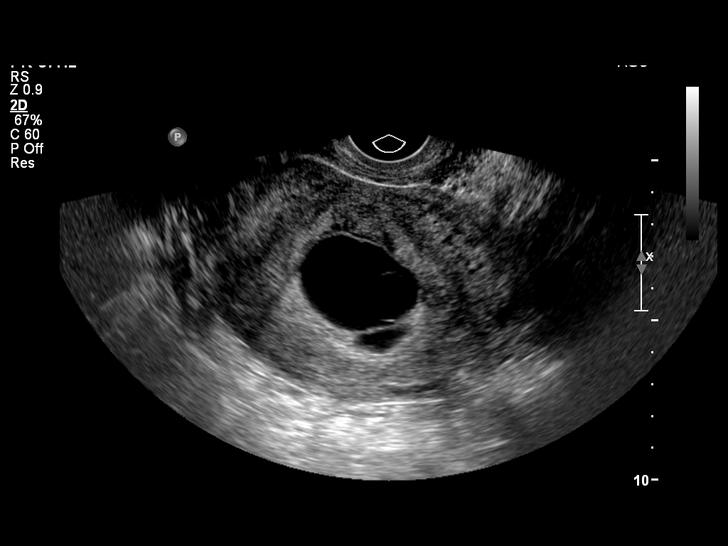
[im 54/59]
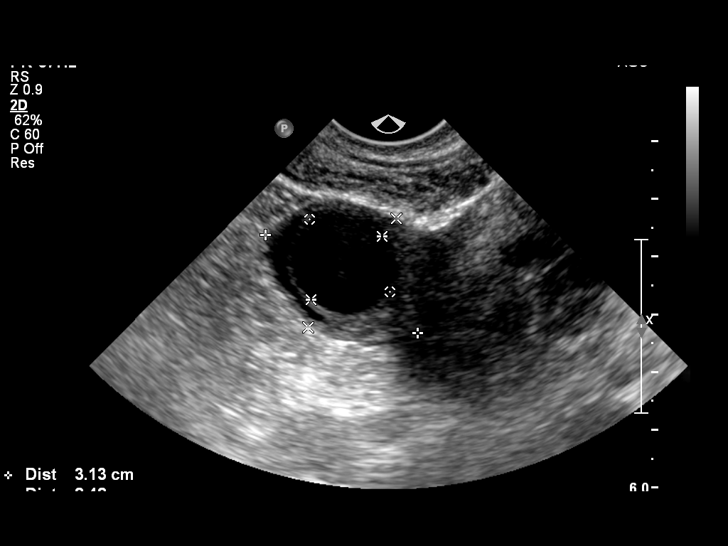
[im 59/59]
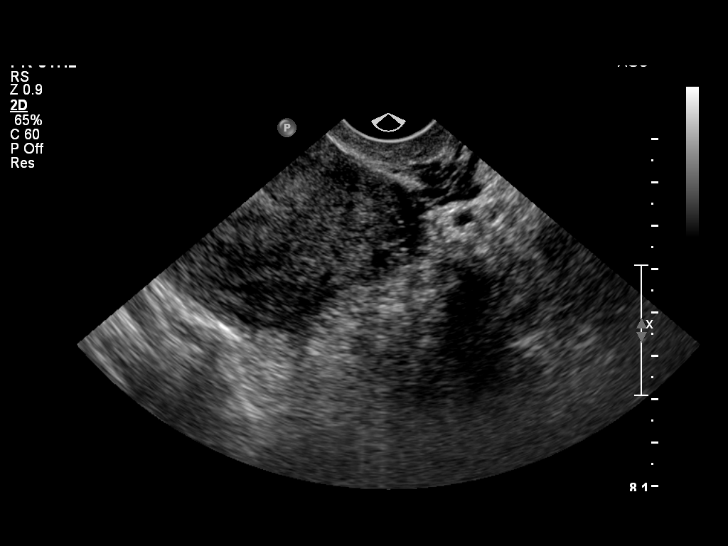

[14 of 28 positions shown; findings below may reference images not displayed]

IMPRESSION: See AS Obstetric US report.

## 2008-10-28 ENCOUNTER — Inpatient Hospital Stay (HOSPITAL_COMMUNITY): Admission: AD | Admit: 2008-10-28 | Discharge: 2008-10-29 | Payer: Self-pay | Admitting: Obstetrics & Gynecology

## 2008-10-28 ENCOUNTER — Encounter: Payer: Self-pay | Admitting: Physician Assistant

## 2008-10-28 ENCOUNTER — Ambulatory Visit: Payer: Self-pay | Admitting: Physician Assistant

## 2008-10-29 ENCOUNTER — Telehealth (INDEPENDENT_AMBULATORY_CARE_PROVIDER_SITE_OTHER): Payer: Self-pay | Admitting: Pharmacist

## 2008-10-29 IMAGING — US US OB TRANSVAGINAL
1 series · 14 of 28 positions shown · non-contrast
Comparison: 

CLINICAL DATA: Pregnancy.  Bleeding.

TRANSVAGINAL OB ULTRASOUND
TECHNIQUE: Transvaginal ultrasound was performed for evaluation of
the gestation as well as the maternal uterus and adnexal regions.

[Series 1: us ob transvaginal · 31 acquisitions, 14 frames shown]
[im 2/31]
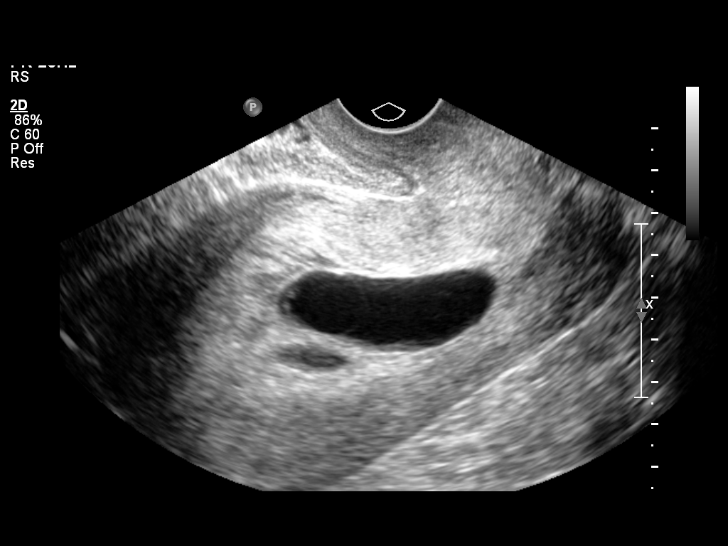
[im 4/31]
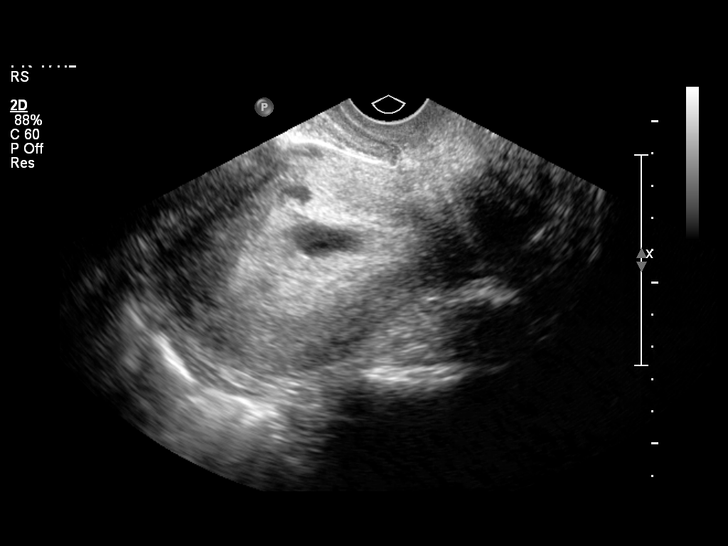
[im 6/31]
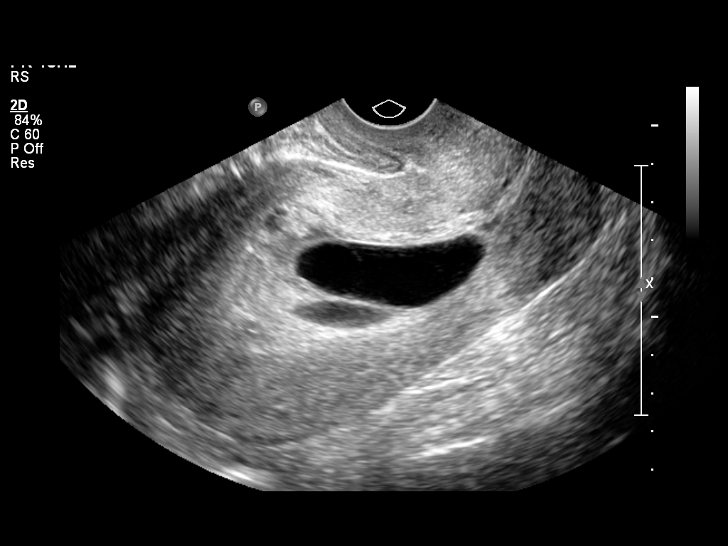
[im 8/31]
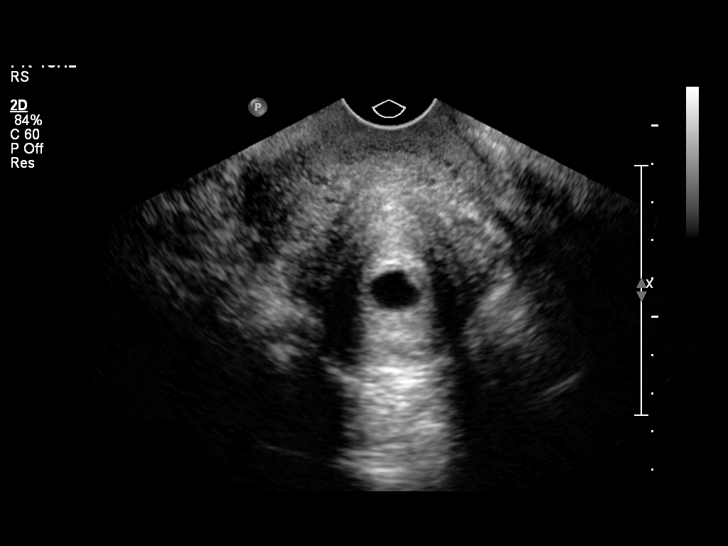
[im 11/31]
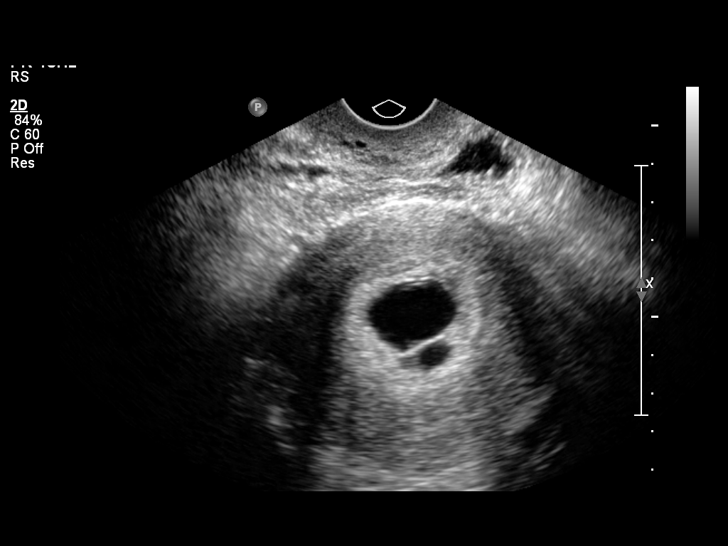
[im 13/31]
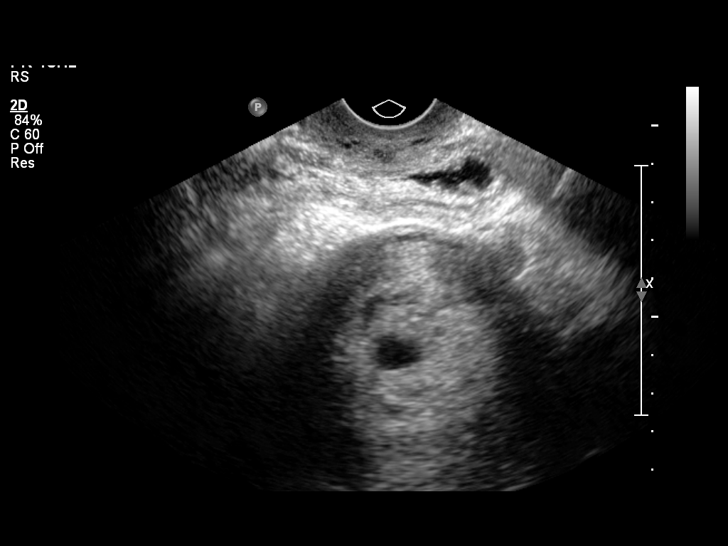
[im 15/31]
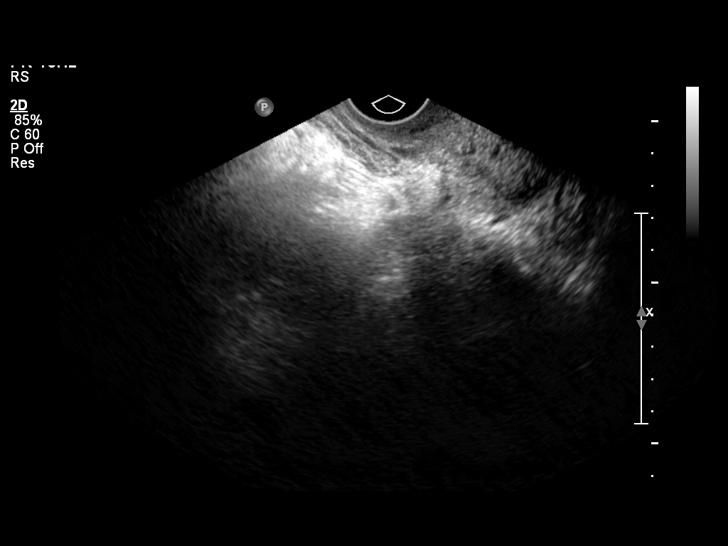
[im 17/31]
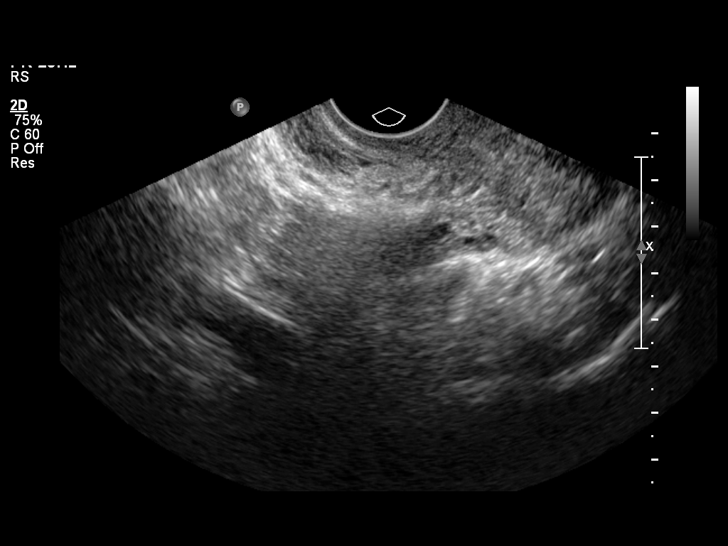
[im 19/31]
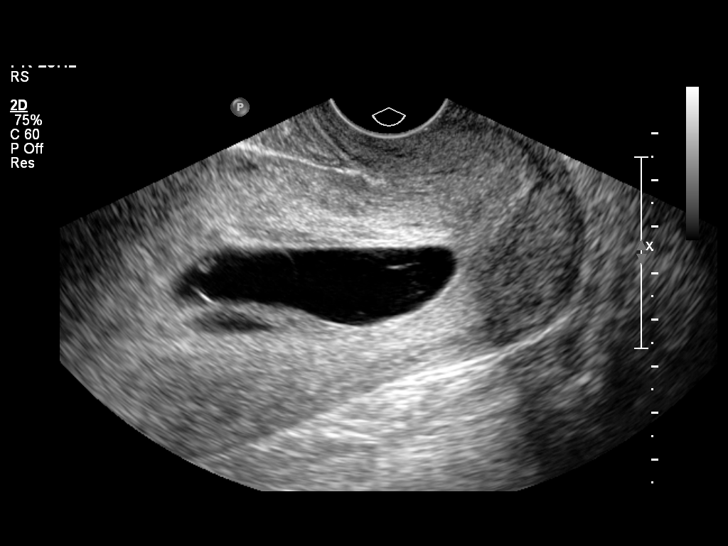
[im 22/31]
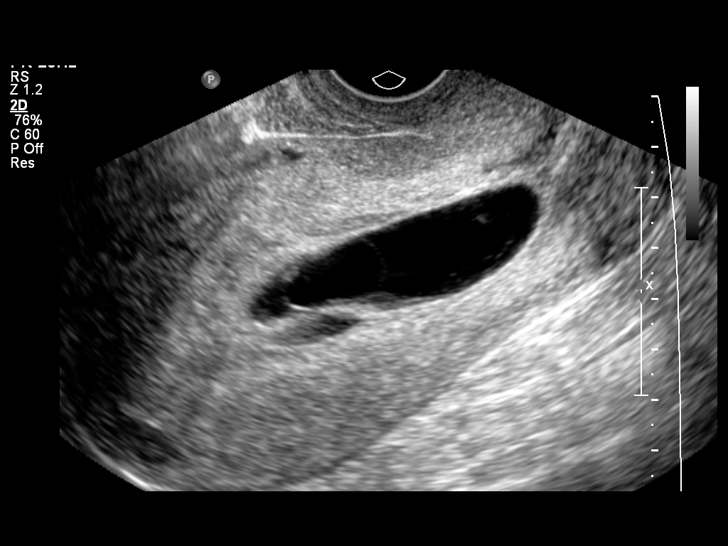
[im 24/31]
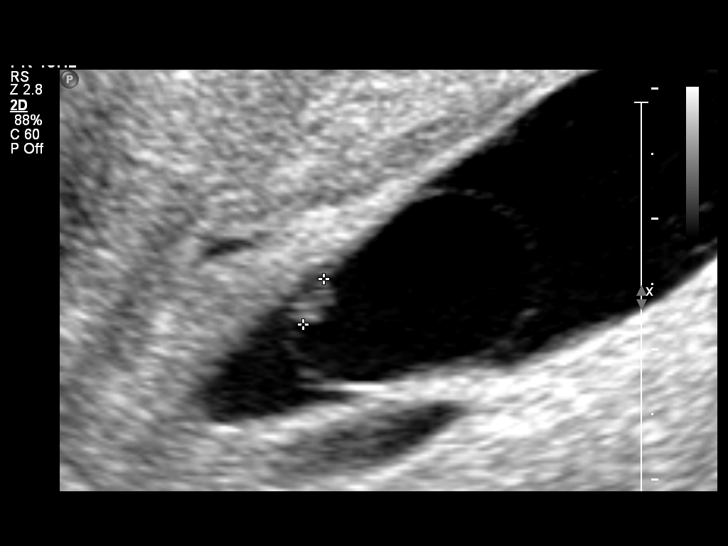
[im 26/31]
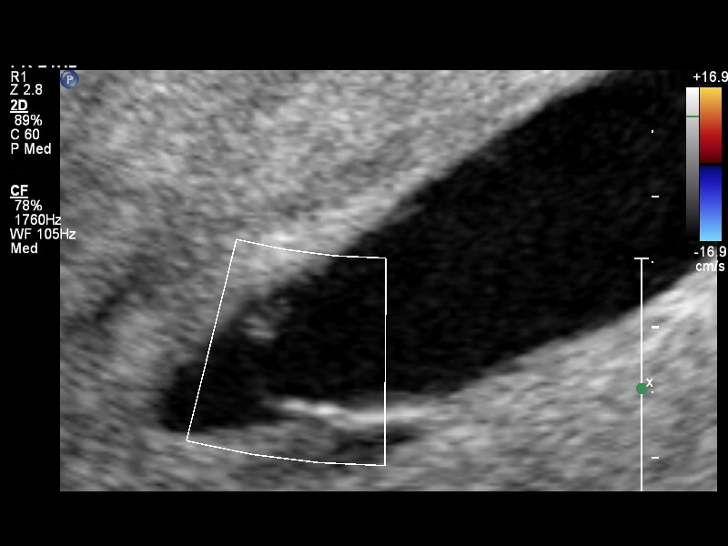
[im 28/31]
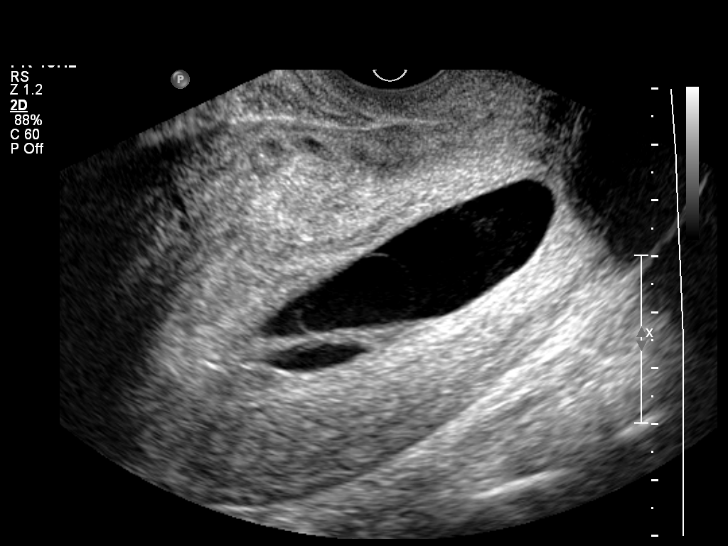
[im 31/31]
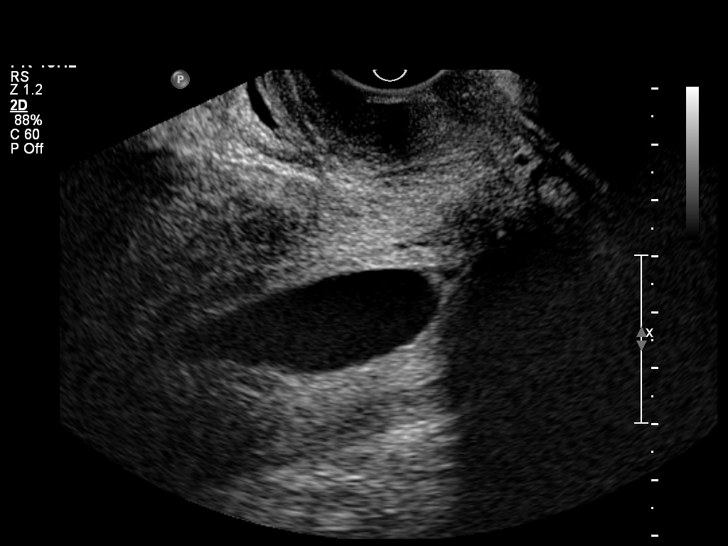

[14 of 28 positions shown; findings below may reference images not displayed]

FINDINGS: There is a single intrauterine gestational sac in the
lower uterine segment.  No evidence of a yolk sac.  Embryo is
identified.  No fetal cardiac activity is appreciated at this time.
Crown rump length is 0.38 cm which corresponds 6-week 1-day
gestation.  There is a moderate subchorionic hemorrhage.  The
ovaries cannot be identified as separate structures.  No free
fluid.
IMPRESSION: 6-[4N]-day IUP.  The gestational sac is located in the lower
uterine segment and has some somewhat of an oblong shape.  No
evidence of a yolk sac.  Embryo is identified.  No cardiac activity
at this time. Cardiac activity is typically visible with a crown
rump length of 5 mm.

Subchorionic hemorrhage.

Consider follow-up pelvic ultrasound.

## 2008-10-31 ENCOUNTER — Inpatient Hospital Stay (HOSPITAL_COMMUNITY): Admission: AD | Admit: 2008-10-31 | Discharge: 2008-10-31 | Payer: Self-pay | Admitting: Obstetrics & Gynecology

## 2008-10-31 IMAGING — US US OB TRANSVAGINAL
1 series · 14 of 28 positions shown · non-contrast
Comparison: [DATE]

CLINICAL DATA: All pain.  Vaginal bleeding.

TRANSVAGINAL OB ULTRASOUND
TECHNIQUE: Transvaginal ultrasound was performed for evaluation of
the gestation as well as the maternal uterus and adnexal regions.

[Series 1: us ob transvaginal · 29 acquisitions, 14 frames shown]
[im 2/29]
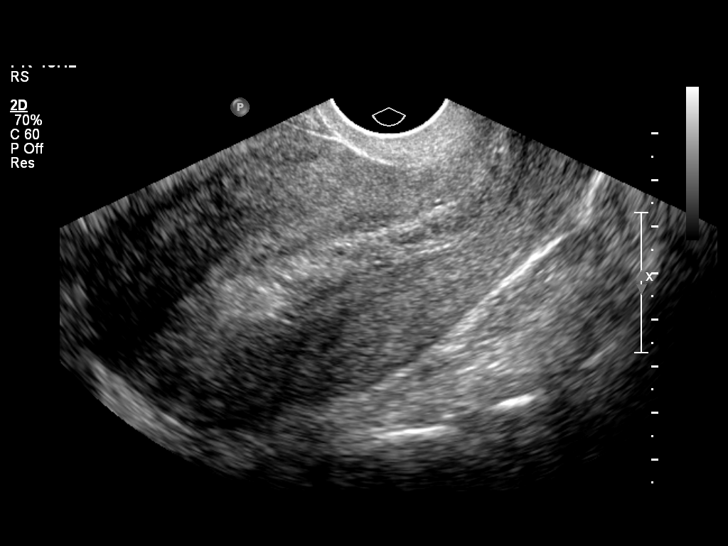
[im 4/29]
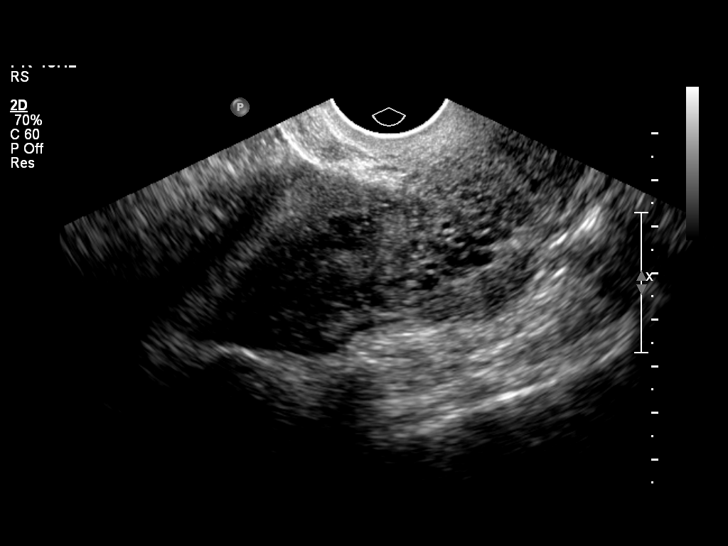
[im 6/29]
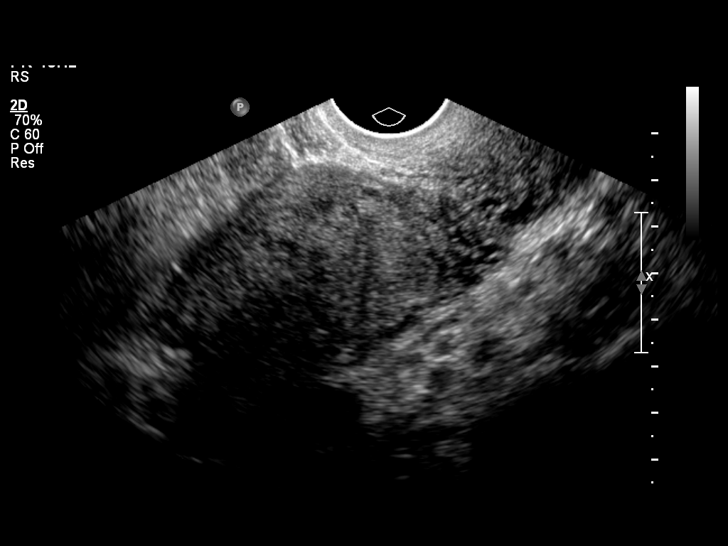
[im 8/29]
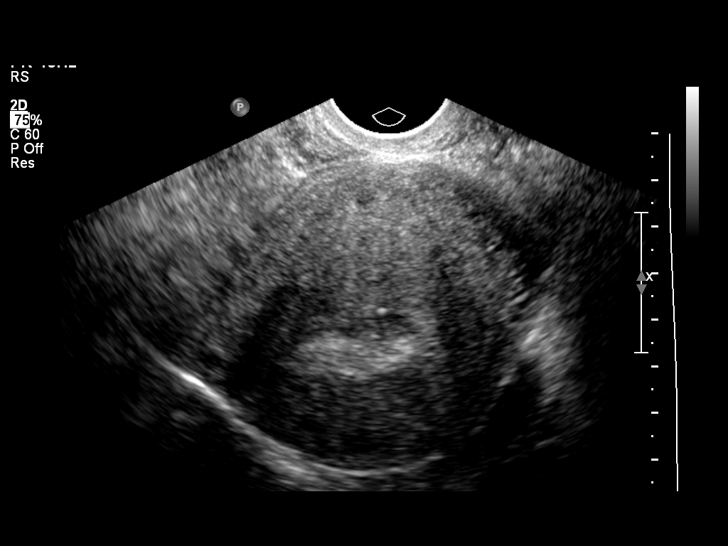
[im 10/29]
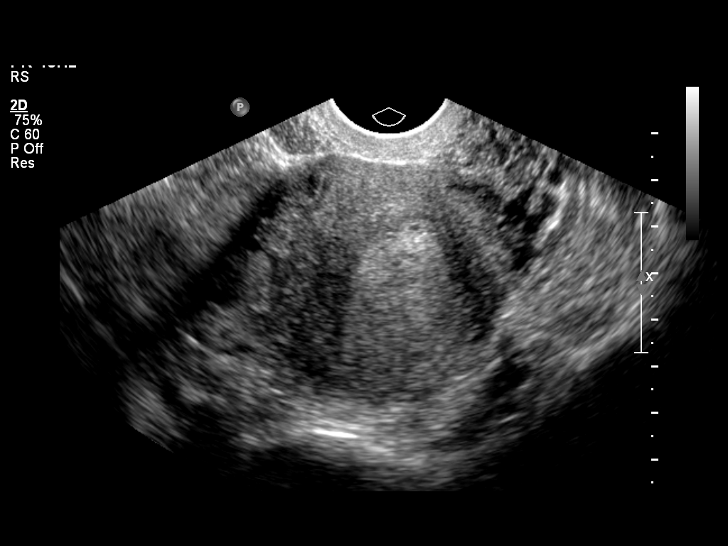
[im 12/29]
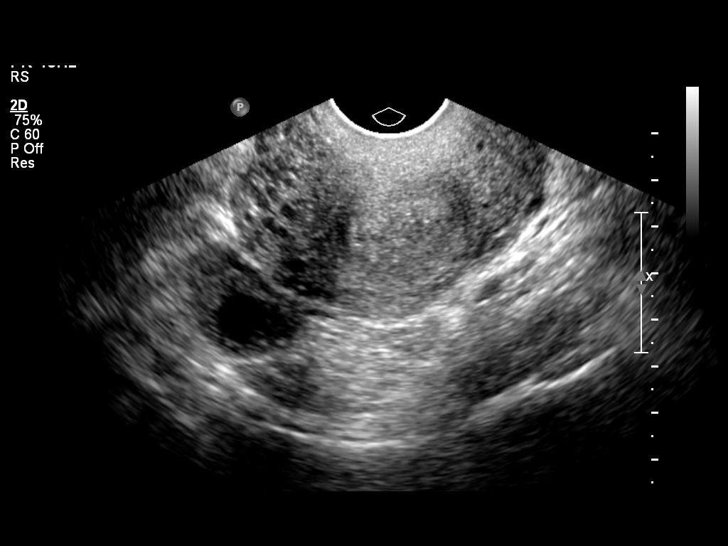
[im 14/29]
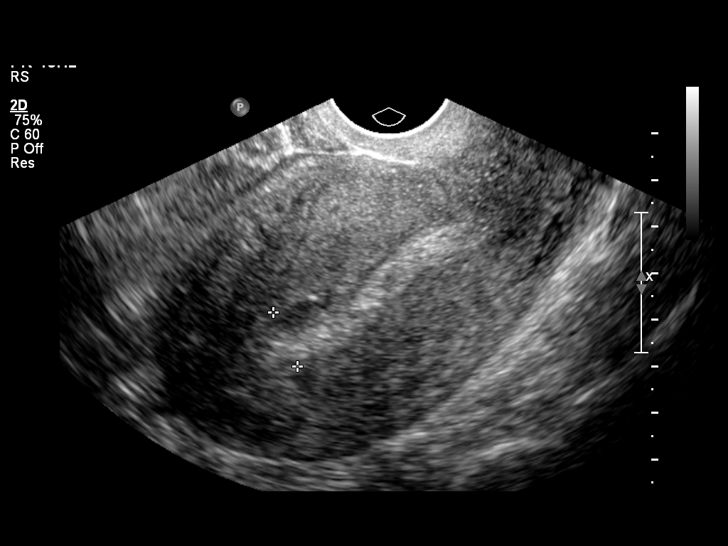
[im 16/29]
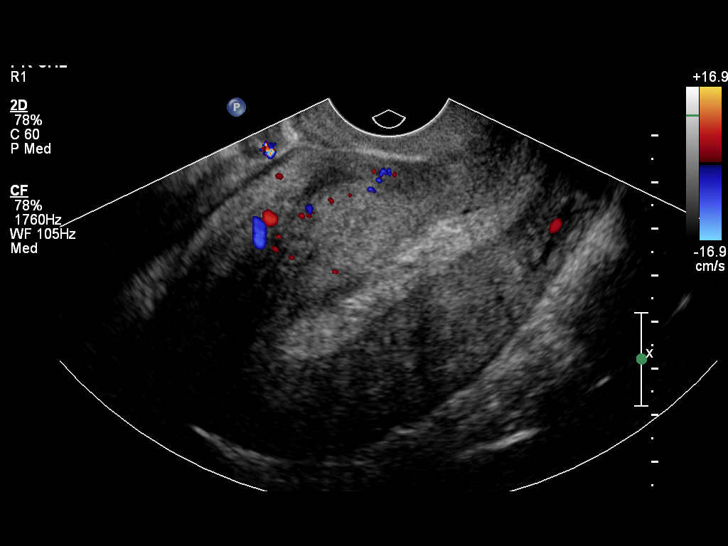
[im 18/29]
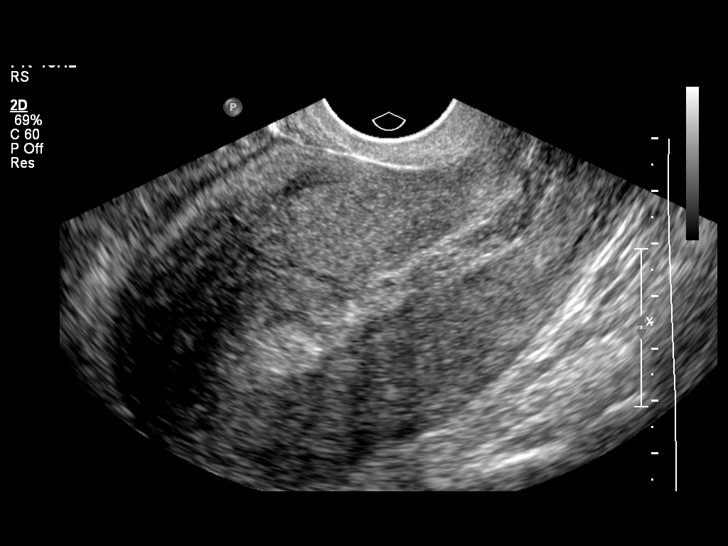
[im 20/29]
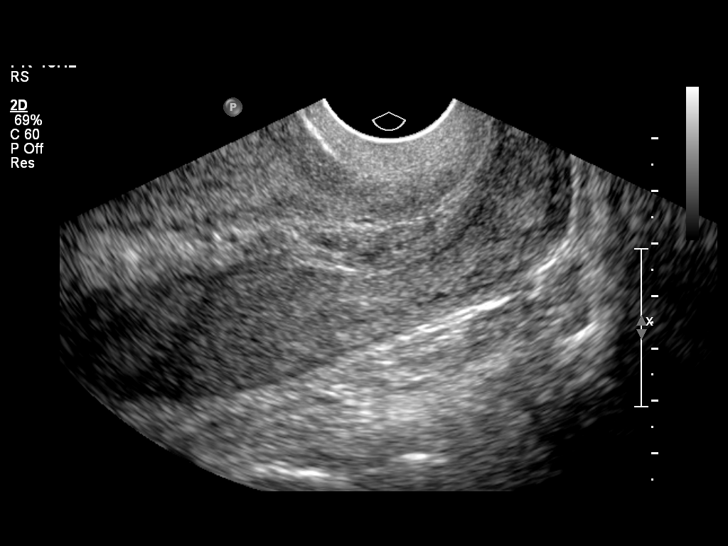
[im 22/29]
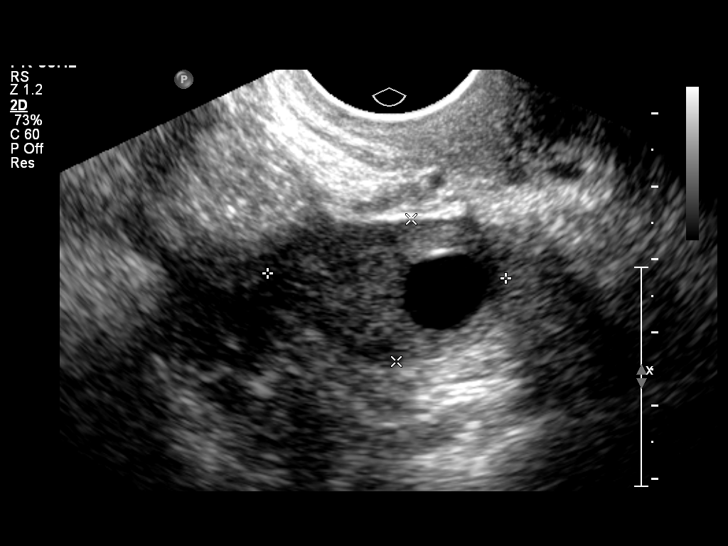
[im 24/29]
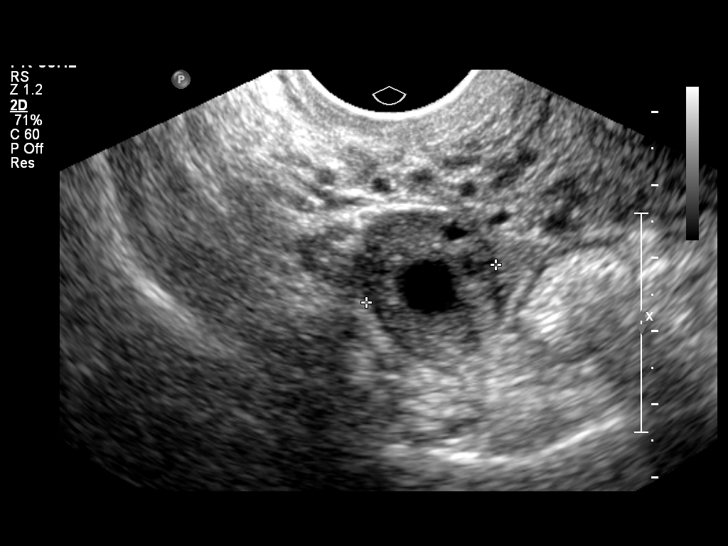
[im 26/29]
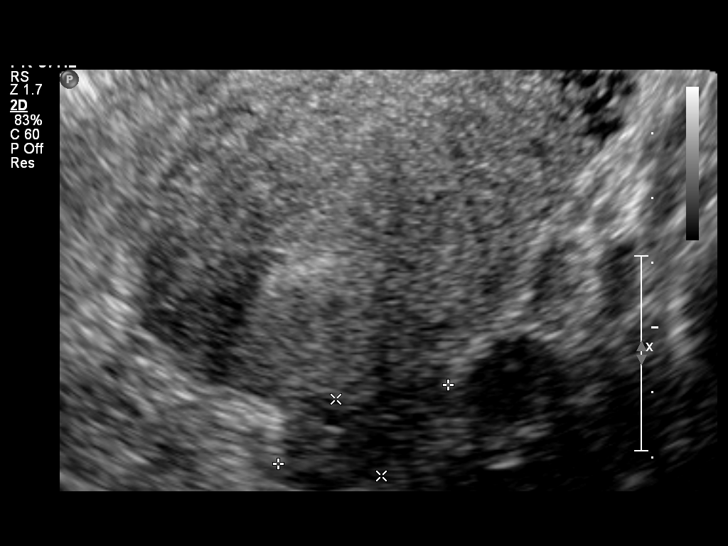
[im 29/29]
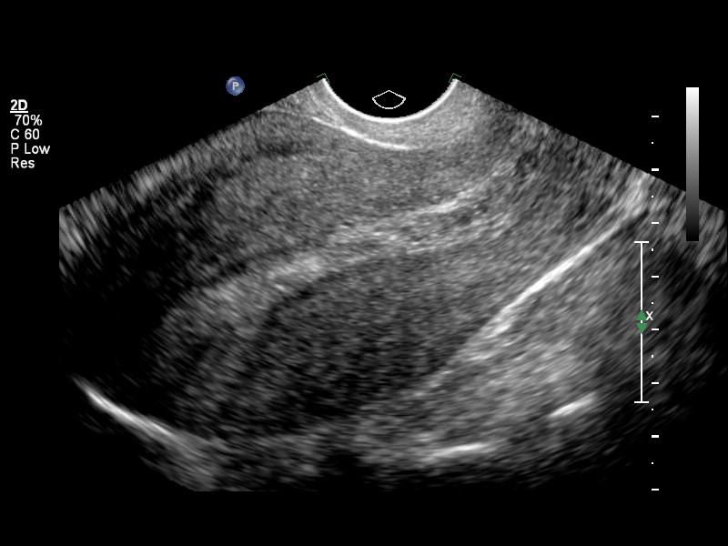

[14 of 28 positions shown; findings below may reference images not displayed]

FINDINGS: Thickening of the endometrial complex.  No intrauterine
gestational sac.  No yolk sac or embryonic.  No cardiac activity is
appreciated.  Normal ovaries.  The right ovary measures 3.3 x 2.0 x
1.9 cm.  Left ovary measures 2.9 x 1.4 x 1.2 cm.  No free fluid.
IMPRESSION: There appears be some thickening of the endometrial complex.
Retained products of conception cannot be excluded.  This may be
due to blood breakdown products.  No gestational sac or embryo.

## 2008-11-05 ENCOUNTER — Ambulatory Visit: Payer: Self-pay | Admitting: Internal Medicine

## 2008-11-18 ENCOUNTER — Ambulatory Visit: Payer: Self-pay | Admitting: Internal Medicine

## 2008-11-21 ENCOUNTER — Encounter: Payer: Self-pay | Admitting: Obstetrics and Gynecology

## 2008-11-21 ENCOUNTER — Ambulatory Visit: Payer: Self-pay | Admitting: Obstetrics and Gynecology

## 2008-12-04 ENCOUNTER — Ambulatory Visit (HOSPITAL_COMMUNITY): Admission: RE | Admit: 2008-12-04 | Discharge: 2008-12-04 | Payer: Self-pay | Admitting: Obstetrics & Gynecology

## 2008-12-04 IMAGING — US US OB TRANSVAGINAL
1 series · 14 of 27 positions shown · non-contrast
Comparison: none

OBSTETRICAL ULTRASOUND:
 This ultrasound exam was performed in the [HOSPITAL] Ultrasound Department.  The OB US report was generated in the AS system, and faxed to the ordering physician.  This report is also available in [REDACTED] PACS.

[Series 1: us ob transvaginal · 27 acquisitions, 14 frames shown]
[im 1/27]
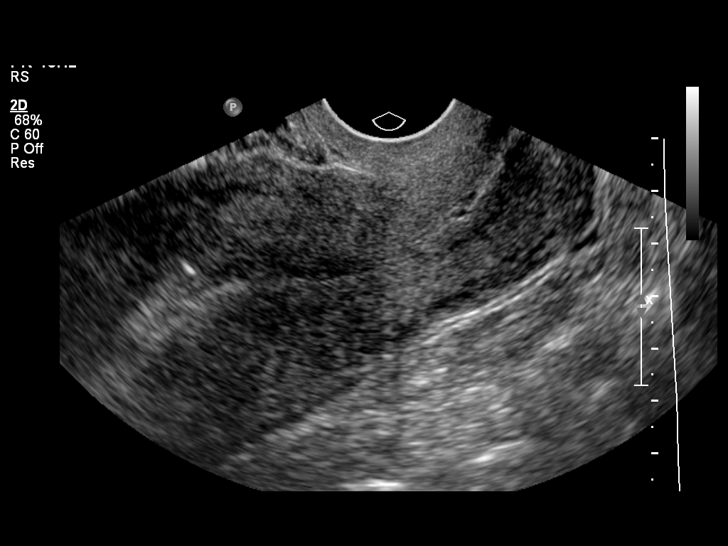
[im 3/27]
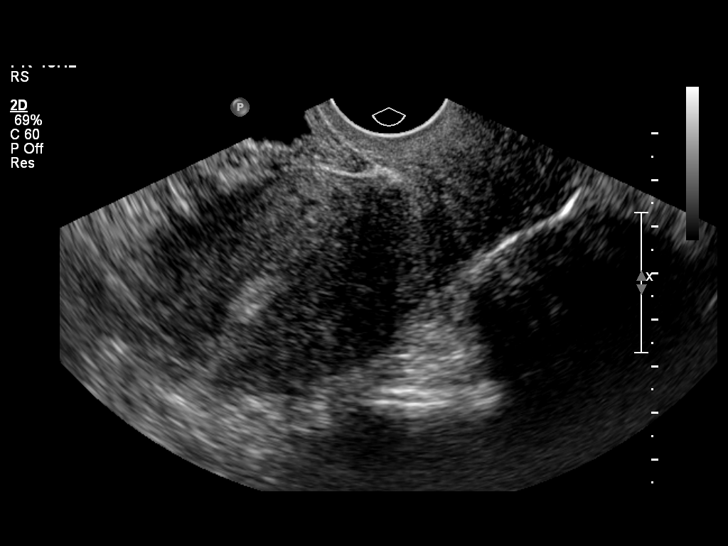
[im 5/27]
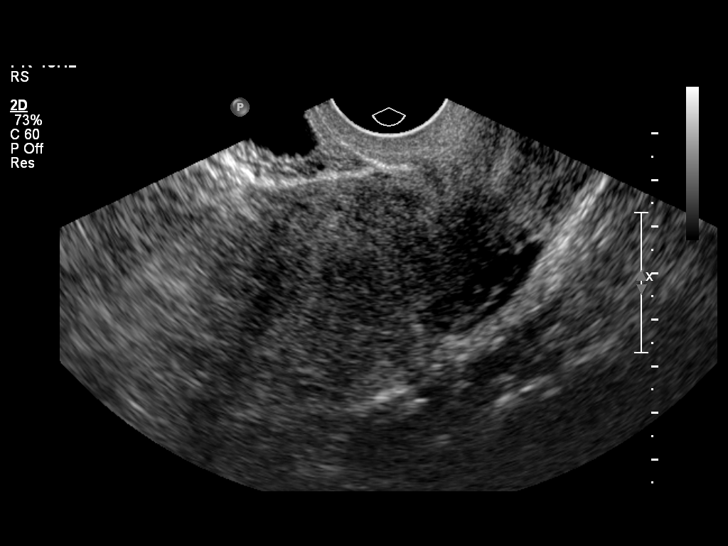
[im 7/27]
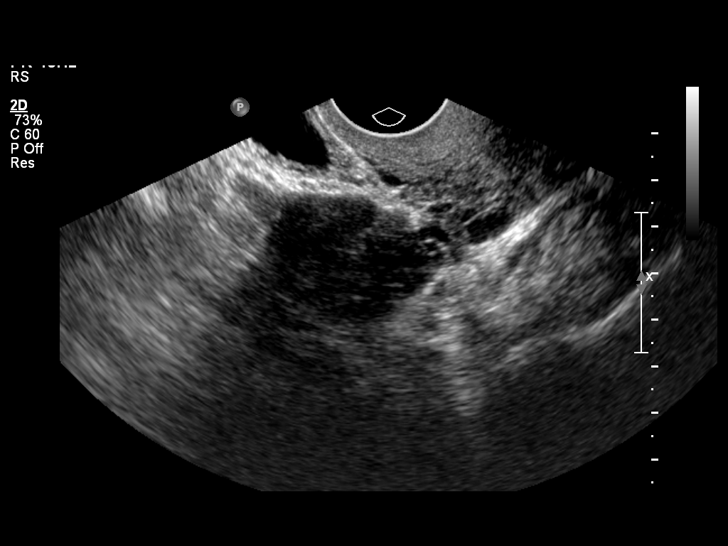
[im 9/27]
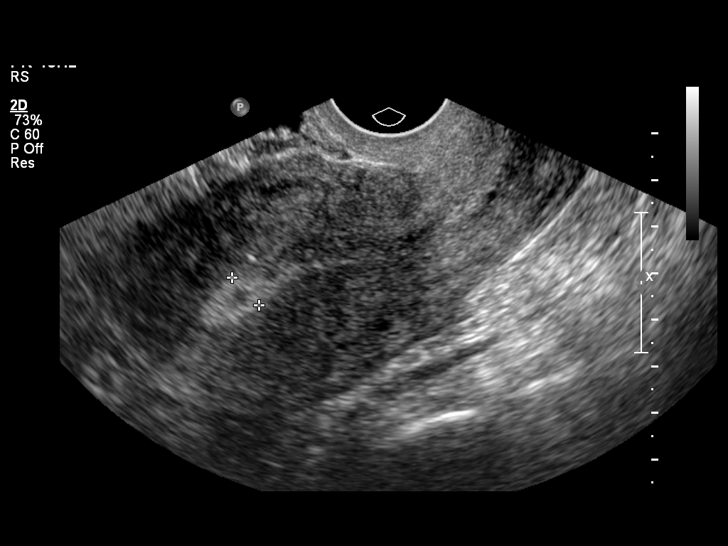
[im 11/27]
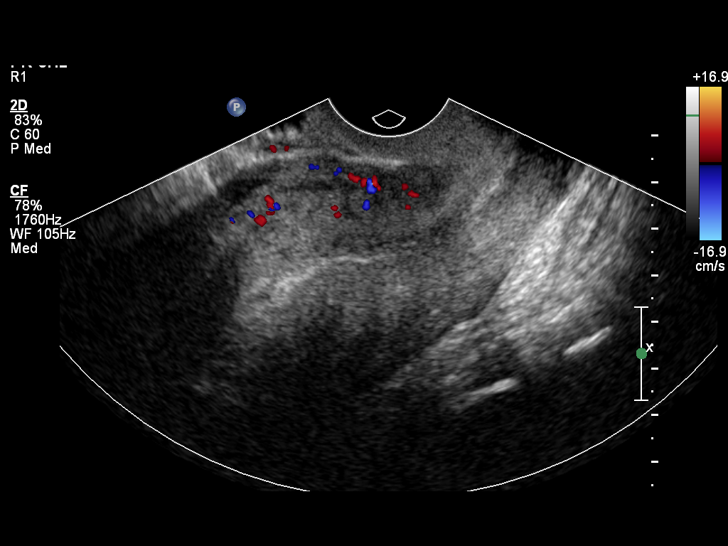
[im 13/27]
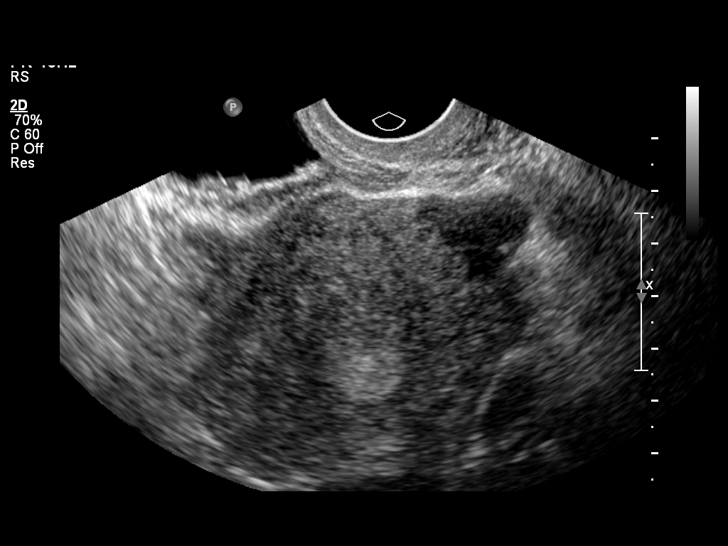
[im 15/27]
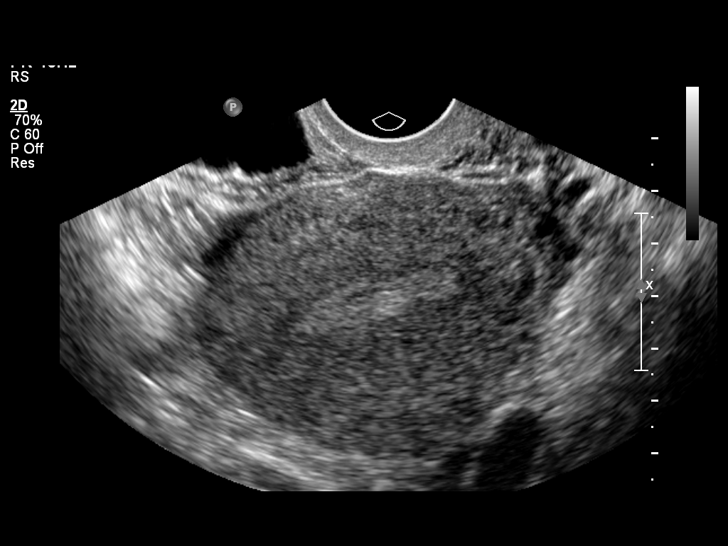
[im 17/27]
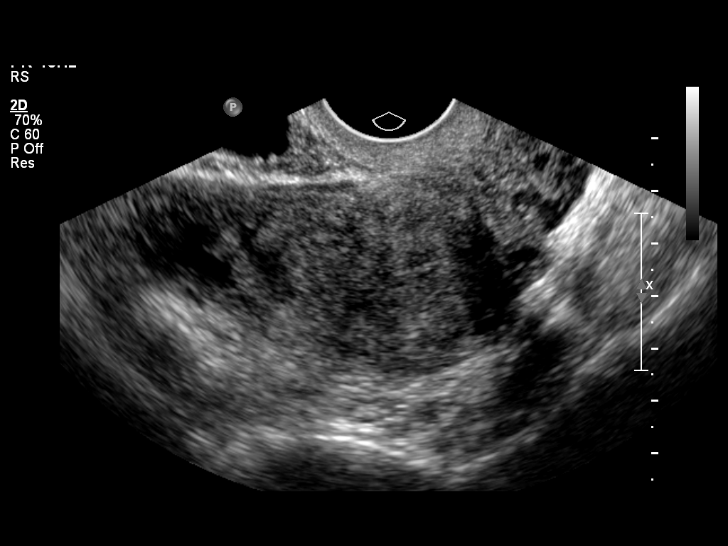
[im 19/27]
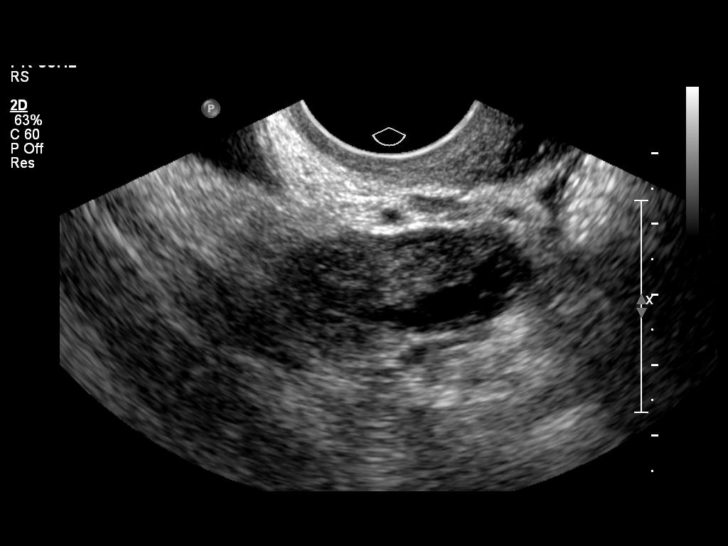
[im 21/27]
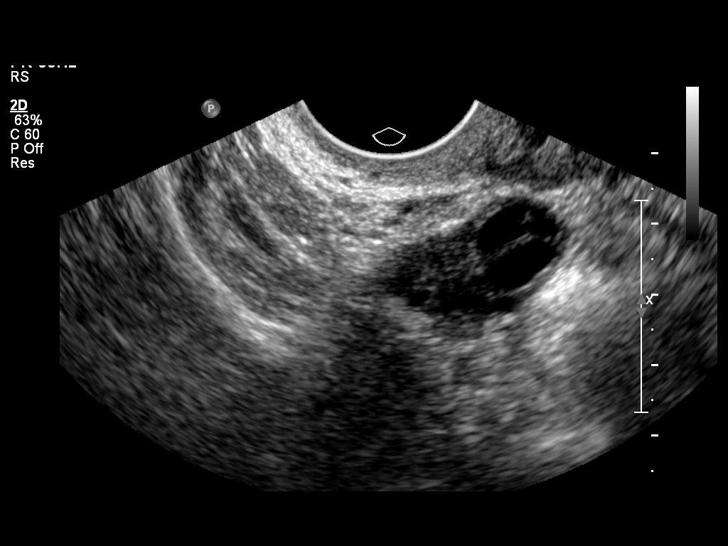
[im 23/27]
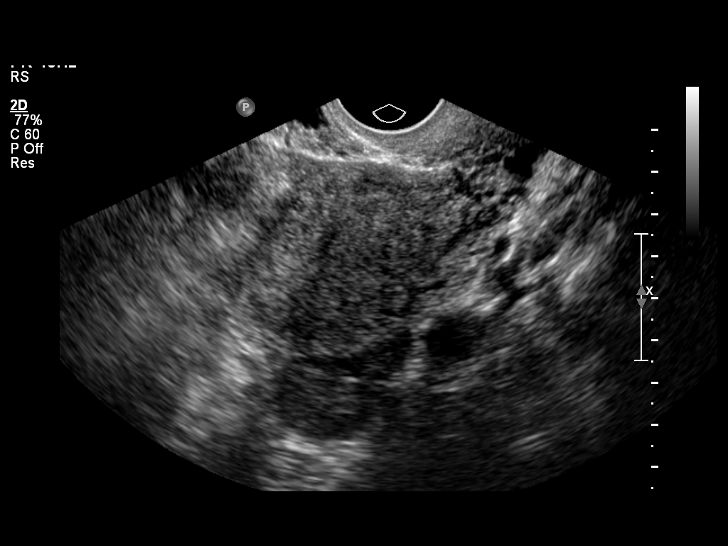
[im 25/27]
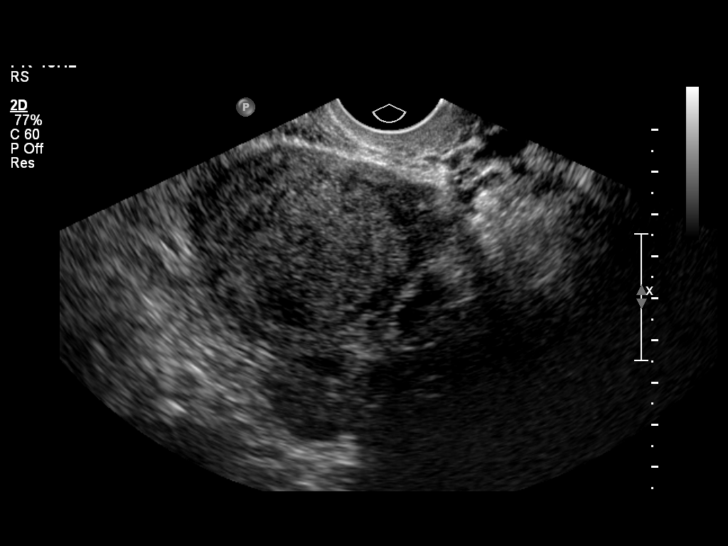
[im 27/27]
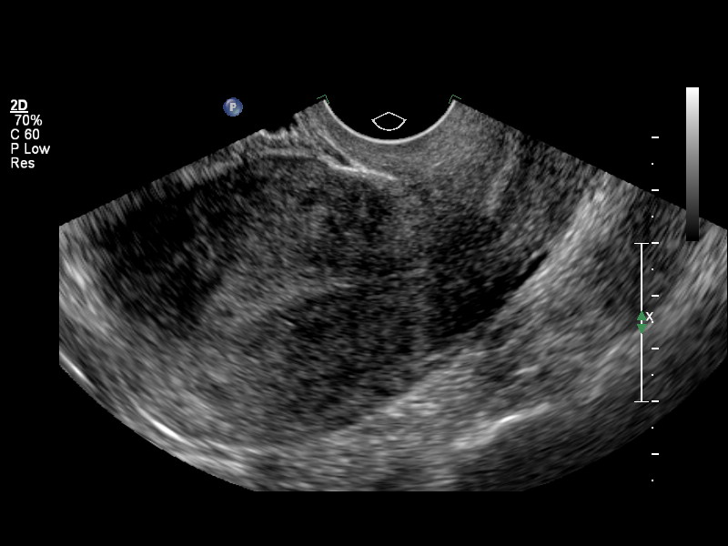

[14 of 27 positions shown; findings below may reference images not displayed]

IMPRESSION: See AS Obstetric US report.

## 2008-12-16 ENCOUNTER — Ambulatory Visit: Payer: Self-pay | Admitting: Internal Medicine

## 2008-12-16 LAB — CONVERTED CEMR LAB: INR: 2

## 2008-12-19 ENCOUNTER — Ambulatory Visit: Payer: Self-pay | Admitting: Family Medicine

## 2009-01-06 ENCOUNTER — Ambulatory Visit: Payer: Self-pay | Admitting: Internal Medicine

## 2009-01-06 LAB — CONVERTED CEMR LAB: INR: 3.8

## 2009-01-22 ENCOUNTER — Ambulatory Visit: Payer: Self-pay | Admitting: Obstetrics and Gynecology

## 2009-01-27 ENCOUNTER — Ambulatory Visit: Payer: Self-pay | Admitting: Internal Medicine

## 2009-01-27 LAB — CONVERTED CEMR LAB

## 2009-02-24 ENCOUNTER — Ambulatory Visit: Payer: Self-pay | Admitting: Internal Medicine

## 2009-03-04 ENCOUNTER — Ambulatory Visit: Payer: Self-pay | Admitting: Internal Medicine

## 2009-03-04 LAB — CONVERTED CEMR LAB
CO2: 25 meq/L (ref 19–32)
Calcium: 9.3 mg/dL (ref 8.4–10.5)
Cholesterol: 215 mg/dL — ABNORMAL HIGH (ref 0–200)
Creatinine, Ser: 0.94 mg/dL (ref 0.40–1.20)
Glucose, Bld: 89 mg/dL (ref 70–99)
Total Bilirubin: 0.5 mg/dL (ref 0.3–1.2)
Total CHOL/HDL Ratio: 4.6
Total Protein: 7.7 g/dL (ref 6.0–8.3)
Triglycerides: 124 mg/dL (ref <150)
VLDL: 25 mg/dL (ref 0–40)

## 2009-03-24 ENCOUNTER — Ambulatory Visit: Payer: Self-pay | Admitting: Internal Medicine

## 2009-03-26 ENCOUNTER — Ambulatory Visit (HOSPITAL_COMMUNITY): Admission: RE | Admit: 2009-03-26 | Discharge: 2009-03-26 | Payer: Self-pay | Admitting: Internal Medicine

## 2009-03-26 IMAGING — MG MM DIGITAL SCREENING BILAT
4 series · 4 of 4 positions shown · non-contrast
Comparison: none

DG SCREEN MAMMOGRAM BILATERAL
Bilateral CC and MLO view(s) were taken.

DIGITAL SCREENING MAMMOGRAM WITH CAD:
The breast tissue is heterogeneously dense.  No masses or malignant type calcifications are 
identified.
Images were processed with CAD.

[R CC]
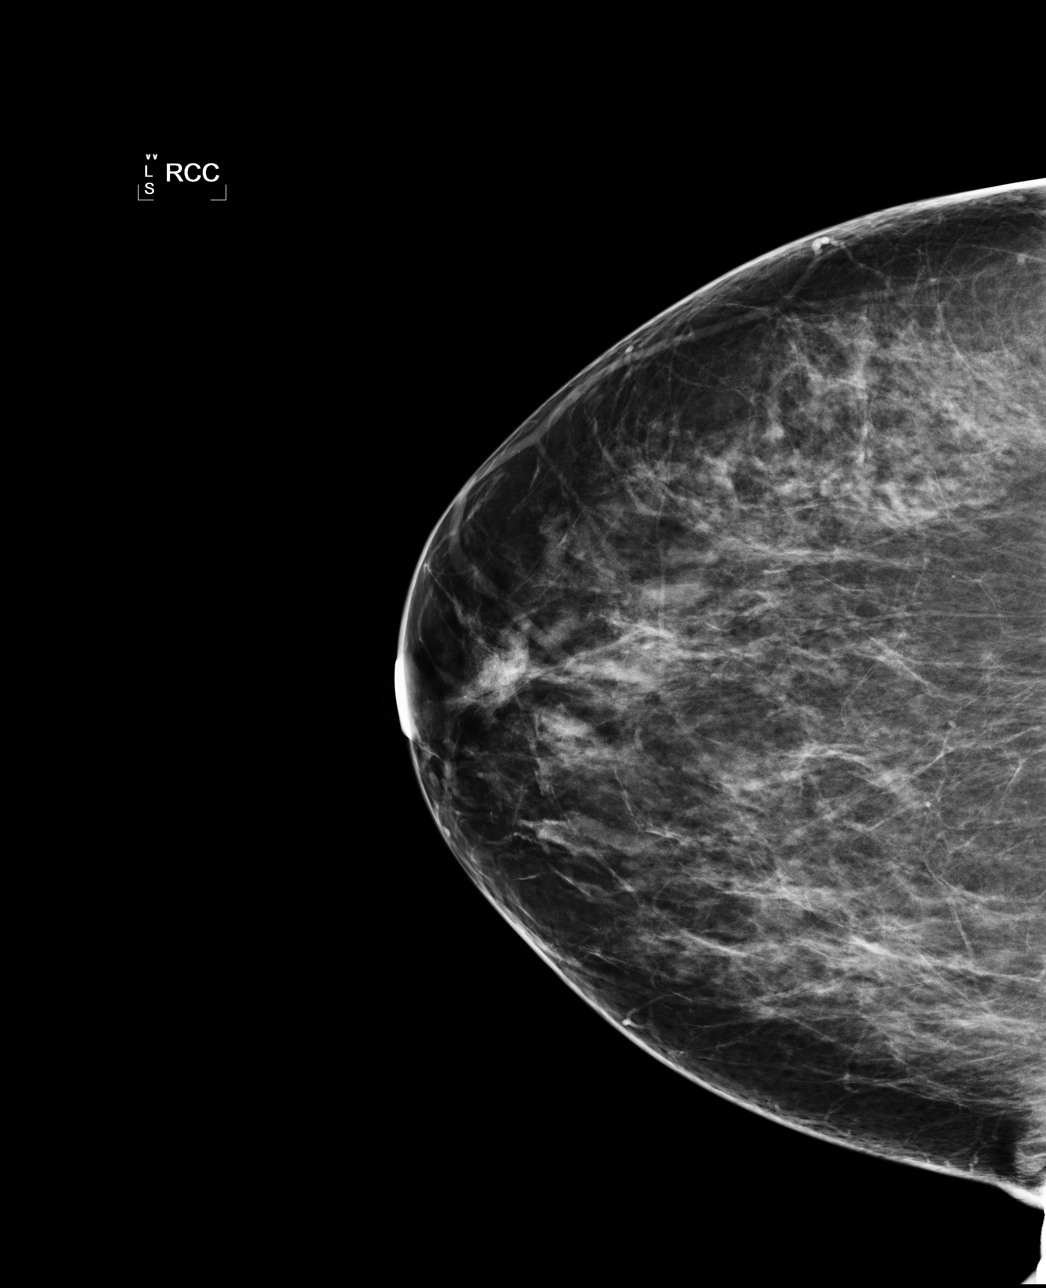

[R MLO]
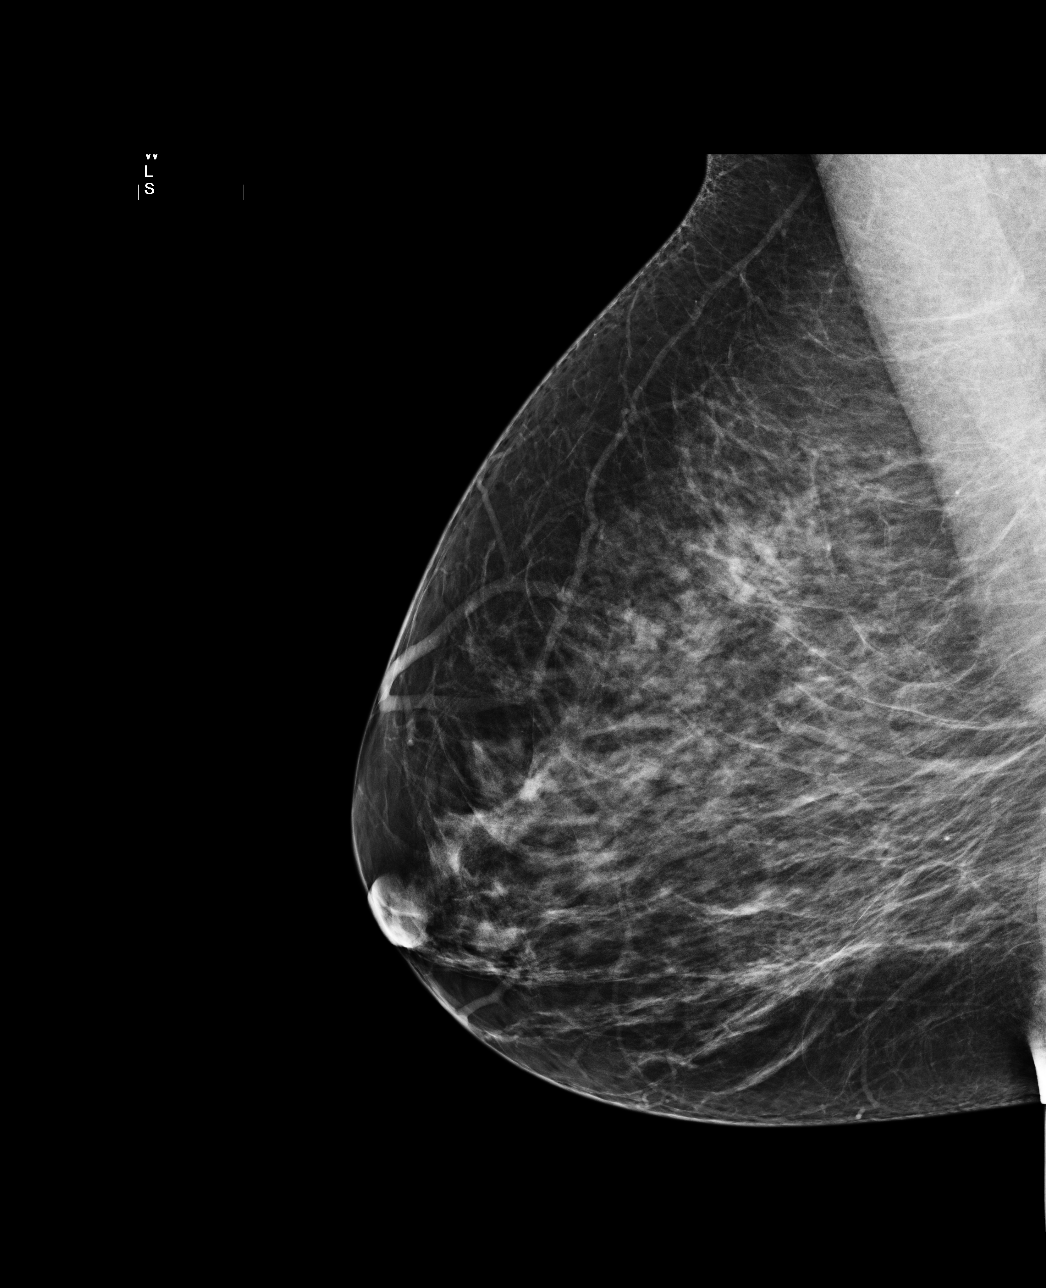

[L CC]
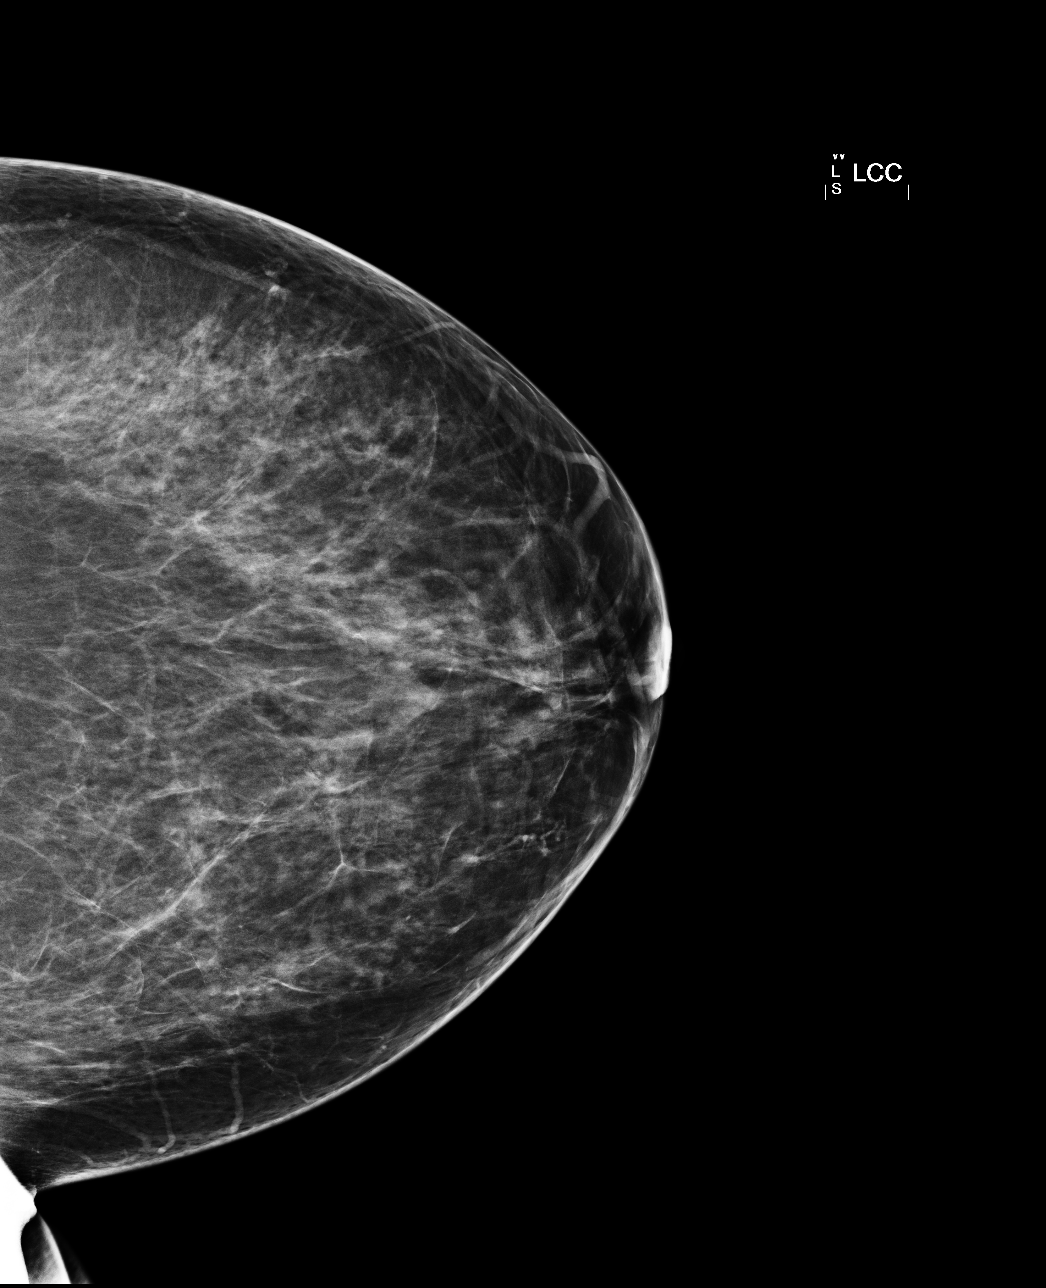

[L MLO]
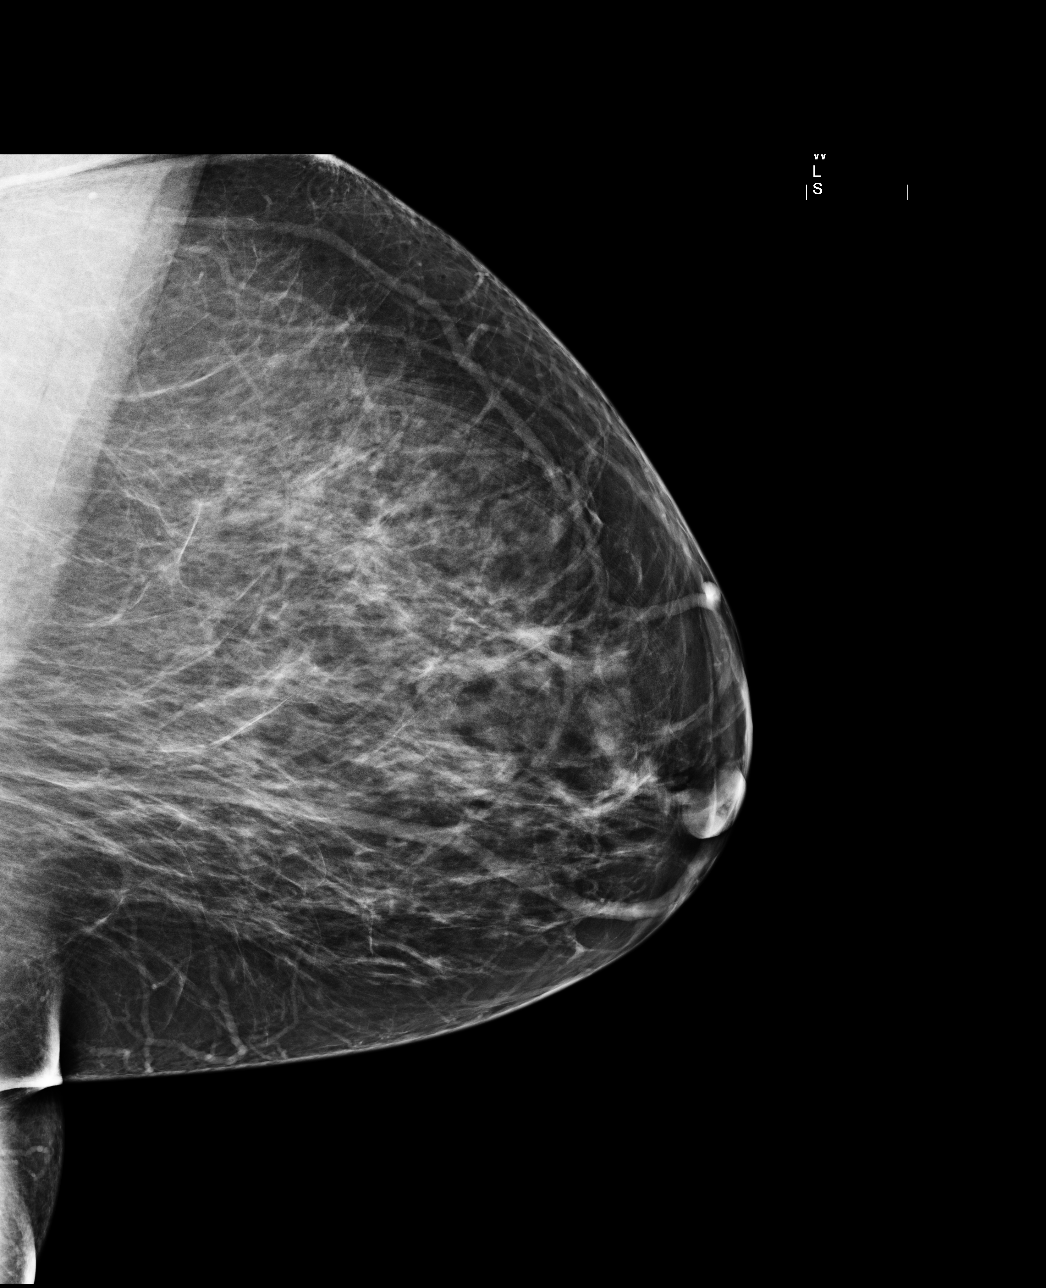

[4 of 4 positions shown; findings below may reference images not displayed]

IMPRESSION: No specific mammographic evidence of malignancy.  Next screening mammogram is recommended in one 
year.

A result letter of this screening mammogram will be mailed directly to the patient.

ASSESSMENT: Negative - BI-RADS 1

Screening mammogram in 1 year.
,

## 2009-03-31 ENCOUNTER — Ambulatory Visit: Payer: Self-pay | Admitting: Internal Medicine

## 2009-03-31 DIAGNOSIS — J019 Acute sinusitis, unspecified: Secondary | ICD-10-CM | POA: Insufficient documentation

## 2009-03-31 DIAGNOSIS — J0140 Acute pansinusitis, unspecified: Secondary | ICD-10-CM | POA: Insufficient documentation

## 2009-04-21 ENCOUNTER — Ambulatory Visit: Payer: Self-pay | Admitting: Internal Medicine

## 2009-04-21 LAB — CONVERTED CEMR LAB

## 2009-05-19 ENCOUNTER — Ambulatory Visit: Payer: Self-pay | Admitting: Internal Medicine

## 2009-05-19 LAB — CONVERTED CEMR LAB: INR: 2.7

## 2009-06-16 ENCOUNTER — Ambulatory Visit: Payer: Self-pay | Admitting: Internal Medicine

## 2009-06-16 LAB — CONVERTED CEMR LAB

## 2009-07-14 ENCOUNTER — Ambulatory Visit: Payer: Self-pay | Admitting: Internal Medicine

## 2009-07-14 LAB — CONVERTED CEMR LAB: INR: 3.2

## 2009-08-18 ENCOUNTER — Ambulatory Visit: Payer: Self-pay | Admitting: Internal Medicine

## 2009-09-15 ENCOUNTER — Ambulatory Visit: Payer: Self-pay | Admitting: Internal Medicine

## 2009-09-15 LAB — CONVERTED CEMR LAB: INR: 3.3

## 2009-10-13 ENCOUNTER — Ambulatory Visit: Payer: Self-pay | Admitting: Internal Medicine

## 2009-10-13 LAB — CONVERTED CEMR LAB: INR: 3.9

## 2009-11-10 ENCOUNTER — Ambulatory Visit: Payer: Self-pay | Admitting: Internal Medicine

## 2009-11-10 LAB — CONVERTED CEMR LAB: INR: 3

## 2009-12-08 ENCOUNTER — Ambulatory Visit: Payer: Self-pay | Admitting: Internal Medicine

## 2009-12-17 ENCOUNTER — Ambulatory Visit: Payer: Self-pay | Admitting: Infectious Disease

## 2009-12-17 DIAGNOSIS — R5383 Other fatigue: Secondary | ICD-10-CM

## 2009-12-17 DIAGNOSIS — R5381 Other malaise: Secondary | ICD-10-CM | POA: Insufficient documentation

## 2009-12-24 ENCOUNTER — Telehealth: Payer: Self-pay | Admitting: *Deleted

## 2009-12-24 LAB — CONVERTED CEMR LAB
AST: 21 units/L (ref 0–37)
Alkaline Phosphatase: 66 units/L (ref 39–117)
BUN: 10 mg/dL (ref 6–23)
Basophils Absolute: 0 10*3/uL (ref 0.0–0.1)
Basophils Relative: 0 % (ref 0–1)
Bilirubin Urine: NEGATIVE
CRP: 0.3 mg/dL (ref <0.6)
Creatinine, Ser: 1.03 mg/dL (ref 0.40–1.20)
Eosinophils Absolute: 0.1 10*3/uL (ref 0.0–0.7)
Eosinophils Relative: 3 % (ref 0–5)
Glucose, Bld: 111 mg/dL — ABNORMAL HIGH (ref 70–99)
HCT: 43 % (ref 36.0–46.0)
MCHC: 35.1 g/dL (ref 30.0–36.0)
MCV: 85.1 fL (ref >=78.0)
Neutrophils Relative %: 45 % (ref 43–77)
Platelets: 270 10*3/uL (ref 150–400)
Protein, ur: NEGATIVE mg/dL
RDW: 13.6 % (ref 11.5–15.5)
Urine Glucose: NEGATIVE mg/dL
WBC: 4.8 10*3/uL (ref 4.0–10.5)

## 2010-01-05 ENCOUNTER — Ambulatory Visit: Payer: Self-pay | Admitting: Internal Medicine

## 2010-01-05 LAB — CONVERTED CEMR LAB
Hemoglobin, Urine: NEGATIVE
INR: 3.3
Ketones, ur: NEGATIVE mg/dL
Urine Glucose: 100 mg/dL — AB
pH: 5.5 (ref 5.0–8.0)

## 2010-02-09 ENCOUNTER — Ambulatory Visit: Payer: Self-pay | Admitting: Internal Medicine

## 2010-03-09 ENCOUNTER — Telehealth: Payer: Self-pay | Admitting: Internal Medicine

## 2010-03-09 ENCOUNTER — Ambulatory Visit: Payer: Self-pay | Admitting: Internal Medicine

## 2010-03-09 LAB — CONVERTED CEMR LAB: INR: 2.3

## 2010-03-23 ENCOUNTER — Ambulatory Visit: Payer: Self-pay | Admitting: Internal Medicine

## 2010-03-23 LAB — CONVERTED CEMR LAB: INR: 4

## 2010-03-27 ENCOUNTER — Ambulatory Visit (HOSPITAL_COMMUNITY): Admission: RE | Admit: 2010-03-27 | Discharge: 2010-03-27 | Payer: Self-pay | Admitting: Internal Medicine

## 2010-03-27 LAB — HM MAMMOGRAPHY: HM Mammogram: NEGATIVE

## 2010-03-27 IMAGING — MG MM DIGITAL SCREENING
4 series · 4 of 4 positions shown · non-contrast
Comparison: none

DG SCREEN MAMMOGRAM BILATERAL
Bilateral CC and MLO view(s) were taken.

DIGITAL SCREENING MAMMOGRAM WITH CAD:
The breast tissue is heterogeneously dense.  No masses or malignant type calcifications are 
identified.  Compared with prior studies.
Images were processed with CAD.

[R CC]
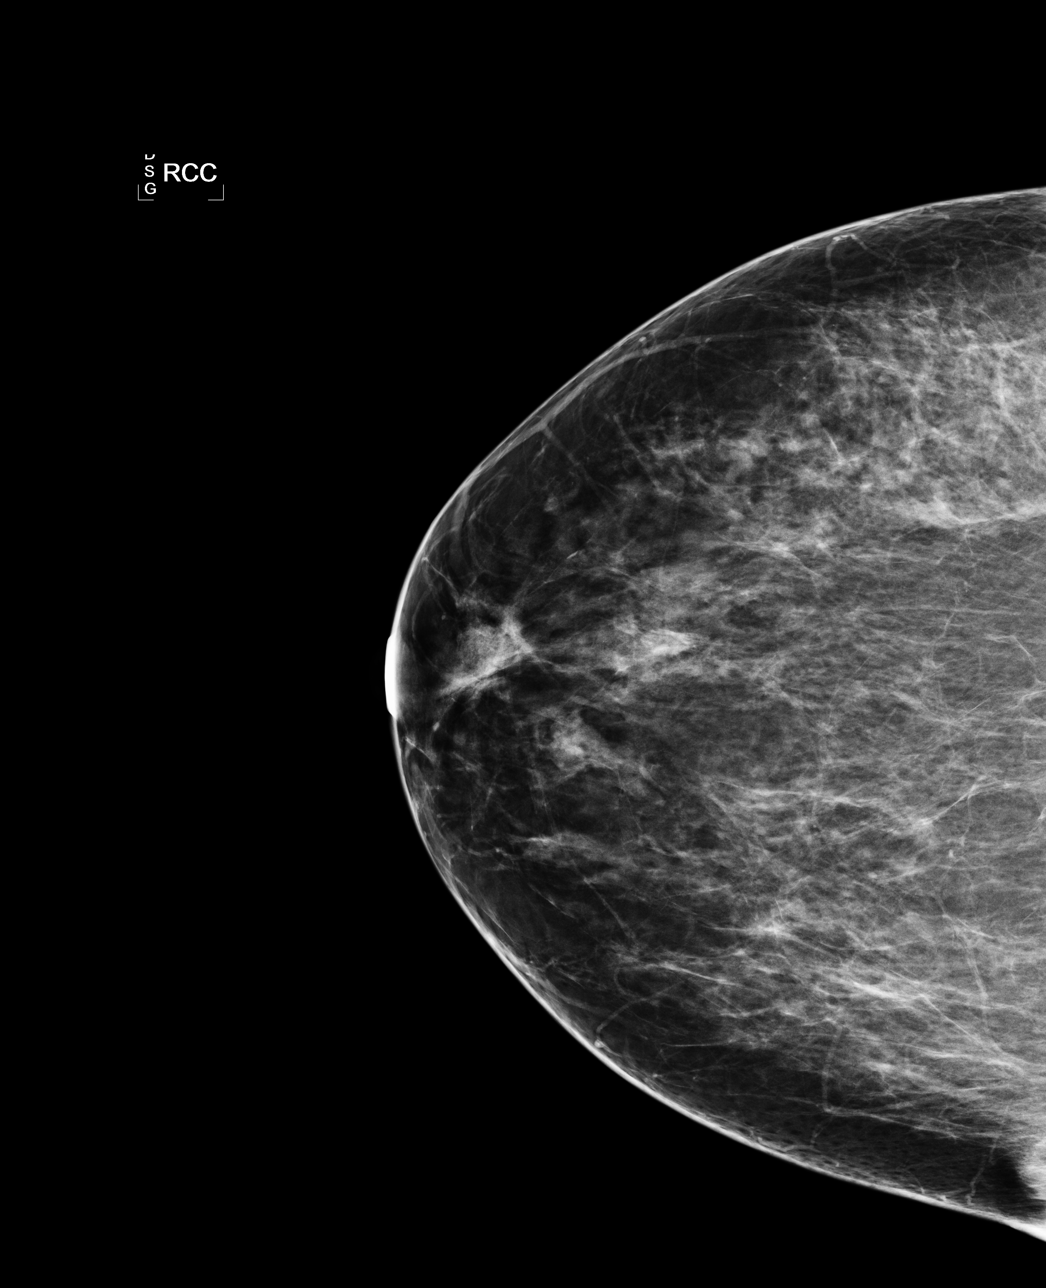

[R MLO]
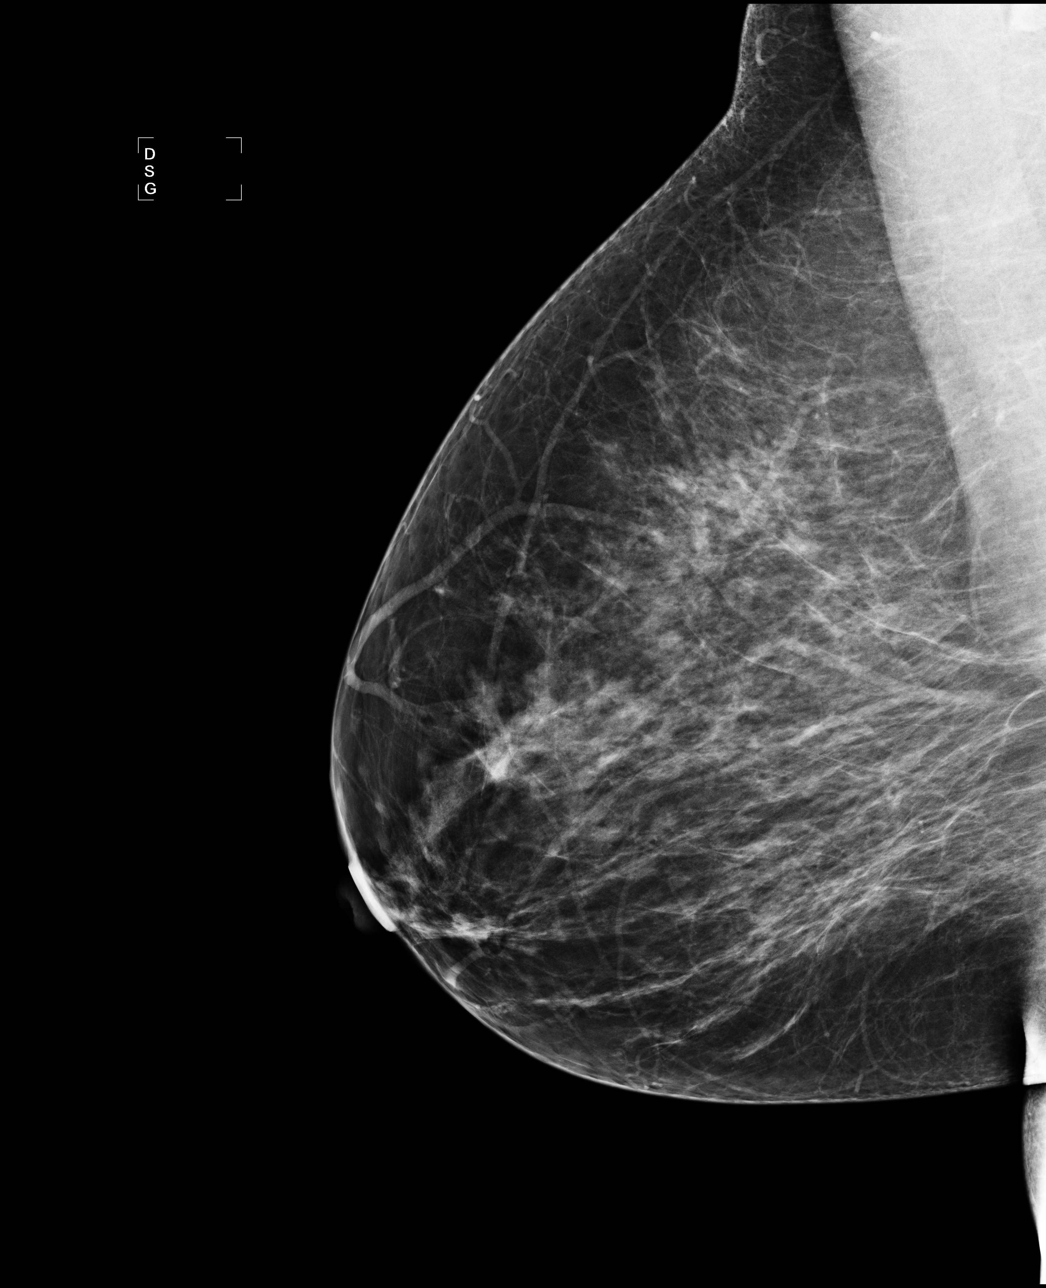

[L CC]
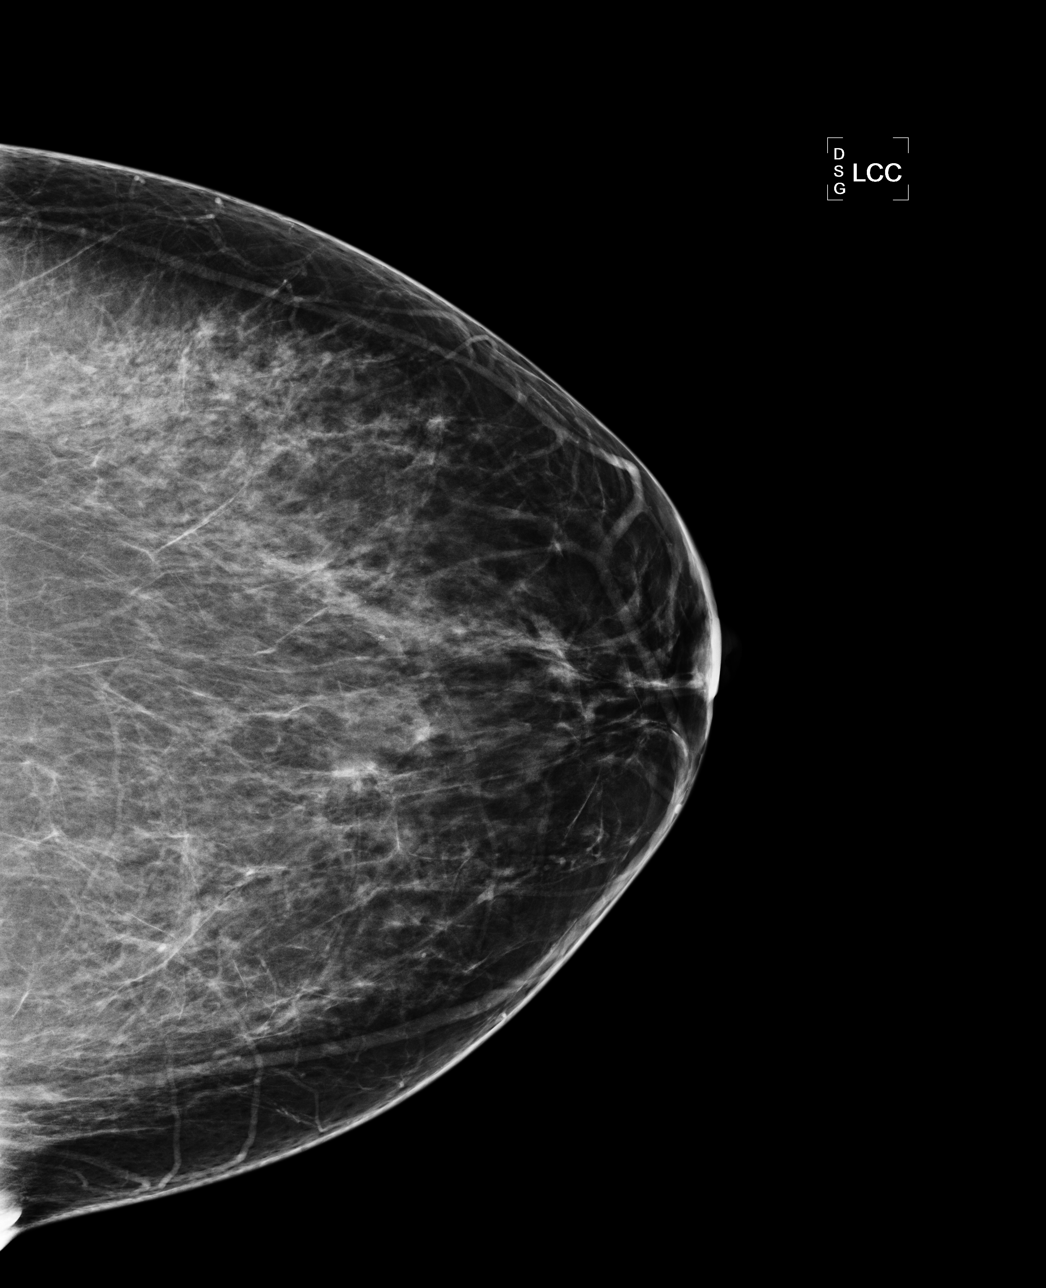

[L MLO]
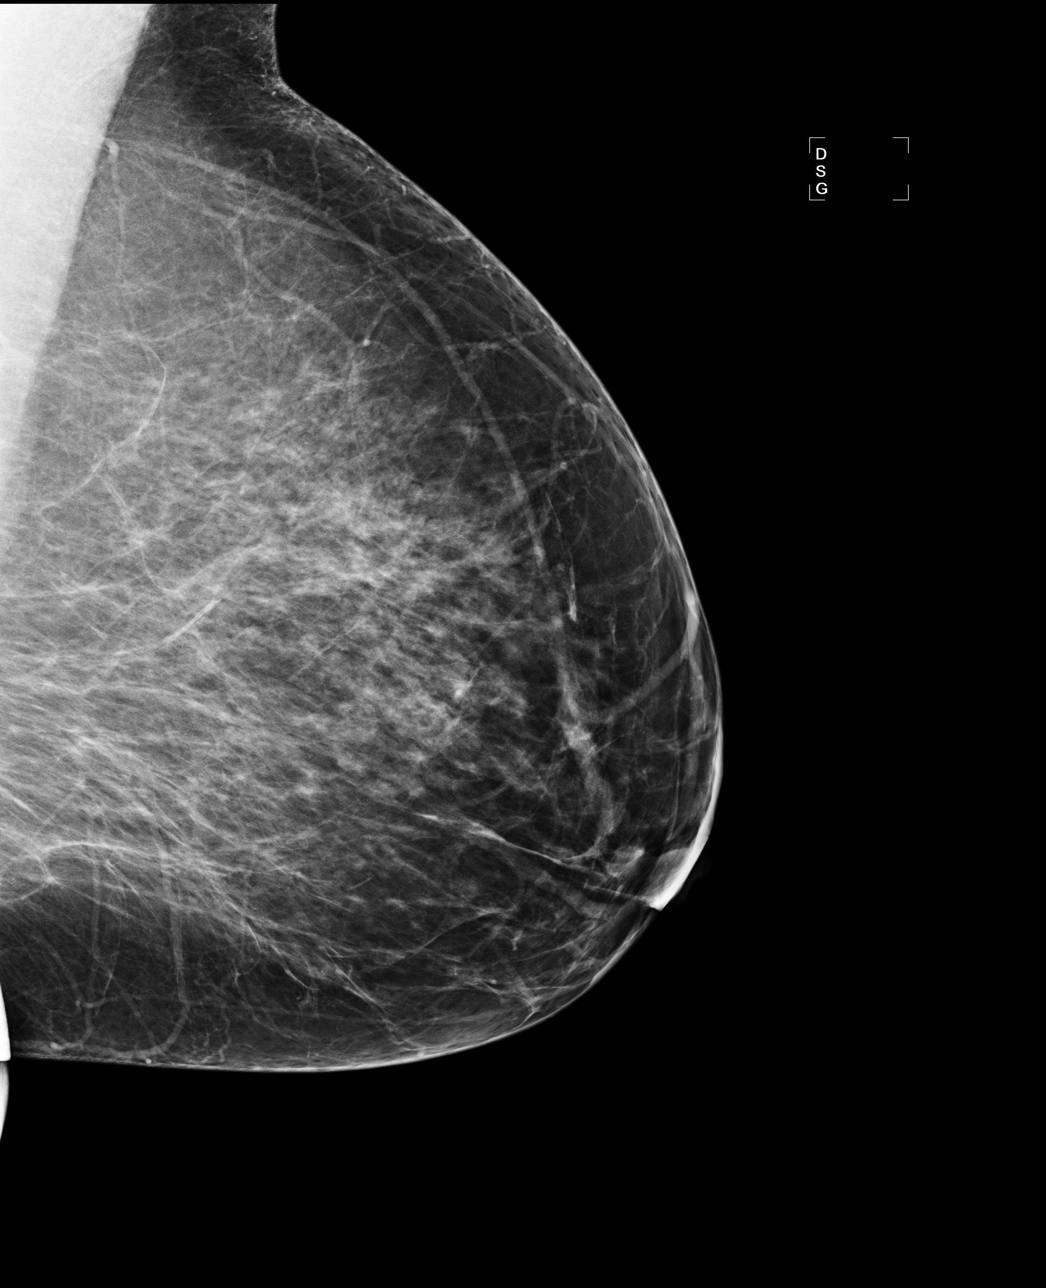

[4 of 4 positions shown; findings below may reference images not displayed]

IMPRESSION: No specific mammographic evidence of malignancy.  Next screening mammogram is recommended in one 
year.

A result letter of this screening mammogram will be mailed directly to the patient.

ASSESSMENT: Negative - BI-RADS 1

Screening mammogram in 1 year.
,

## 2010-04-20 ENCOUNTER — Ambulatory Visit: Payer: Self-pay | Admitting: Internal Medicine

## 2010-04-20 LAB — CONVERTED CEMR LAB

## 2010-05-25 ENCOUNTER — Ambulatory Visit: Payer: Self-pay | Admitting: Internal Medicine

## 2010-06-22 ENCOUNTER — Ambulatory Visit: Payer: Self-pay | Admitting: Internal Medicine

## 2010-07-20 ENCOUNTER — Ambulatory Visit: Payer: Self-pay | Admitting: Internal Medicine

## 2010-08-18 DIAGNOSIS — Z7901 Long term (current) use of anticoagulants: Secondary | ICD-10-CM

## 2010-08-18 DIAGNOSIS — D6859 Other primary thrombophilia: Secondary | ICD-10-CM

## 2010-08-18 DIAGNOSIS — I2699 Other pulmonary embolism without acute cor pulmonale: Secondary | ICD-10-CM | POA: Insufficient documentation

## 2010-08-20 ENCOUNTER — Ambulatory Visit: Admission: RE | Admit: 2010-08-20 | Payer: Self-pay | Source: Home / Self Care | Admitting: Obstetrics and Gynecology

## 2010-08-23 ENCOUNTER — Encounter: Payer: Self-pay | Admitting: Obstetrics & Gynecology

## 2010-08-24 ENCOUNTER — Encounter: Payer: Self-pay | Admitting: *Deleted

## 2010-08-24 ENCOUNTER — Ambulatory Visit: Admit: 2010-08-24 | Payer: Self-pay

## 2010-08-30 LAB — CONVERTED CEMR LAB: Pap Smear: NORMAL

## 2010-09-01 ENCOUNTER — Encounter: Payer: Self-pay | Admitting: Pharmacist

## 2010-09-01 NOTE — Assessment & Plan Note (Signed)
Summary: COU/CH  Anticoagulant Therapy Managed by: Barbera Setters. Janie Morning  PharmD CACP PCP: [name] MD [facility] Attending: Rogelia Boga MD, Lanora Manis Indication 1: Pulmonary  embolus Indication 2: Aftercare long term use Anticoagulants V58.61,V58.83 Start date: 04/02/2004 Duration: Indefinite  Patient Assessment Reviewed by: Chancy Milroy PharmD  June 22, 2010 Medication review: verified warfarin dosage & schedule,verified previous prescription medications, verified doses & any changes, verified new medications, reviewed OTC medications, reviewed OTC health products-vitamins supplements etc Complications: none Dietary changes: none   Health status changes: none   Lifestyle changes: none   Recent/future hospitalizations: none   Recent/future procedures: none   Recent/future dental: none Patient Assessment Part 2:  Have you MISSED ANY DOSES or CHANGED TABLETS?  No missed Warfarin doses or changed tablets.  Have you had any BRUISING or BLEEDING ( nose or gum bleeds,blood in urine or stool)?  No reported bruising or bleeding in nose, gums, urine, stool.  Have you STARTED or STOPPED any MEDICATIONS, including OTC meds,herbals or supplements?  No other medications or herbal supplements were started or stopped.  Have you CHANGED your DIET, especially green vegetables,or ALCOHOL intake?  No changes in diet or alcohol intake.  Have you had any ILLNESSES or HOSPITALIZATIONS?  No reported illnesses or hospitalizations  Have you had any signs of CLOTTING?(chest discomfort,dizziness,shortness of breath,arms tingling,slurred speech,swelling or redness in leg)    No chest discomfort, dizziness, shortness of breath, tingling in arm, slurred speech, swelling, or redness in leg.     Treatment  Target INR: 2.5-3.5 INR: 3.4  Date: 06/22/2010 Regimen In:  75.0mg /week INR reflects regimen in: 3.4  New  Tablet strength: : 7.5mg  Regimen Out:     Sunday: 1 & 1/2 Tablet     Monday: 1 & 1/2  Tablet     Tuesday: 1 & 1/2 Tablet     Wednesday: 1 Tablet     Thursday: 1 & 1/2 Tablet      Friday: 1 & 1/2 Tablet     Saturday: 1 & 1/2 Tablet Total Weekly: 75.0mg /week mg  Next INR Due: 07/20/2010 Adjusted by: Barbera Setters. Alexandria Lodge III PharmD CACP   Return to anticoagulation clinic:  07/20/2010 Time of next visit: 1430    Allergies: 1)  ! Aspirin

## 2010-09-01 NOTE — Assessment & Plan Note (Signed)
Summary: COU/CH  Anticoagulant Therapy Managed by: Barbera Setters. [name]  PharmD CACP PCP: [name] MD [facility] Attending: Rogelia Boga MD, Lanora Manis Indication 1: Pulmonary  embolus Indication 2: Aftercare long term use Anticoagulants V58.61,V58.83 Start date: 04/02/2004 Duration: Indefinite  Patient Assessment Reviewed by: Chancy Milroy PharmD  March 23, 2010 Medication review: verified warfarin dosage & schedule,verified previous prescription medications, verified doses & any changes, verified new medications, reviewed OTC medications, reviewed OTC health products-vitamins supplements etc Complications: none Dietary changes: none   Health status changes: none   Lifestyle changes: none   Recent/future hospitalizations: none   Recent/future procedures: none   Recent/future dental: none Patient Assessment Part 2:  Have you MISSED ANY DOSES or CHANGED TABLETS?  No missed Warfarin doses or changed tablets.  Have you had any BRUISING or BLEEDING ( nose or gum bleeds,blood in urine or stool)?  No reported bruising or bleeding in nose, gums, urine, stool.  Have you STARTED or STOPPED any MEDICATIONS, including OTC meds,herbals or supplements?  No other medications or herbal supplements were started or stopped.  Have you CHANGED your DIET, especially green vegetables,or ALCOHOL intake?  No changes in diet or alcohol intake.  Have you had any ILLNESSES or HOSPITALIZATIONS?  No reported illnesses or hospitalizations  Have you had any signs of CLOTTING?(chest discomfort,dizziness,shortness of breath,arms tingling,slurred speech,swelling or redness in leg)    No chest discomfort, dizziness, shortness of breath, tingling in arm, slurred speech, swelling, or redness in leg.     Treatment  Target INR: 2.5-3.5 INR: 4.0  Date: 03/23/2010 Regimen In:  82.5mg /week INR reflects regimen in: 4.0  New  Tablet strength: : 7.5mg  Regimen Out:     Sunday: 1 & 1/2 Tablet     Monday: 1 & 1/2  Tablet     Tuesday: 1 & 1/2 Tablet     Wednesday: 1 & 1/2 Tablet     Thursday: 1 & 1/2 Tablet      Friday: 1 & 1/2 Tablet     Saturday: 1 & 1/2 Tablet Total Weekly: 78.75mg /week mg  Next INR Due: 04/20/2010 Adjusted by: Barbera Setters. Alexandria Lodge III PharmD CACP   Return to anticoagulation clinic:  04/20/2010 Time of next visit: 1445    Allergies: 1)  ! Aspirin

## 2010-09-01 NOTE — Assessment & Plan Note (Signed)
Summary: COU/CH  Anticoagulant Therapy Managed by: Barbera Setters. Janie Morning  PharmD CACP PCP: [name] MD [facility] Attending: Darl Pikes, Beth Indication 1: Pulmonary  embolus Indication 2: Aftercare long term use Anticoagulants V58.61,V58.83 Start date: 04/02/2004 Duration: Indefinite  Patient Assessment Reviewed by: Chancy Milroy PharmD  August 18, 2009 Medication review: verified warfarin dosage & schedule,verified previous prescription medications, verified doses & any changes, verified new medications, reviewed OTC medications, reviewed OTC health products-vitamins supplements etc Complications: none Dietary changes: none   Health status changes: none   Lifestyle changes: none   Recent/future hospitalizations: none   Recent/future procedures: none   Recent/future dental: none Patient Assessment Part 2:  Have you MISSED ANY DOSES or CHANGED TABLETS?  No missed Warfarin doses or changed tablets.  Have you had any BRUISING or BLEEDING ( nose or gum bleeds,blood in urine or stool)?  No reported bruising or bleeding in nose, gums, urine, stool.  Have you STARTED or STOPPED any MEDICATIONS, including OTC meds,herbals or supplements?  No other medications or herbal supplements were started or stopped.  Have you CHANGED your DIET, especially green vegetables,or ALCOHOL intake?  No changes in diet or alcohol intake.  Have you had any ILLNESSES or HOSPITALIZATIONS?  No reported illnesses or hospitalizations  Have you had any signs of CLOTTING?(chest discomfort,dizziness,shortness of breath,arms tingling,slurred speech,swelling or redness in leg)    No chest discomfort, dizziness, shortness of breath, tingling in arm, slurred speech, swelling, or redness in leg.     Treatment  Target INR: 2.5-3.5 INR: 2.7  Date: 08/18/2009 Regimen In:  75.0mg /week INR reflects regimen in: 2.7  New  Tablet strength: : 7.5mg  Regimen Out:     Sunday: 1 & 1/2 Tablet     Monday: 1 & 1/2 Tablet     Tuesday: 1 & 1/2 Tablet     Wednesday: 1 & 1/2 Tablet     Thursday: 1 & 1/2 Tablet      Friday: 1 & 1/2 Tablet     Saturday: 1 & 1/2 Tablet Total Weekly: 78.75mg /week mg  Next INR Due: 09/15/2009 Adjusted by: Barbera Setters. Alexandria Lodge III PharmD CACP   Return to anticoagulation clinic:  09/15/2009 Time of next visit: 1530    Allergies: 1)  ! Aspirin

## 2010-09-01 NOTE — Assessment & Plan Note (Signed)
Summary: COU/VS  Anticoagulant Therapy Managed by: Barbera Setters. Janie Morning  PharmD CACP PCP: [name] MD [facility] Attending: Sampson Goon MD, David Indication 1: Pulmonary  embolus Indication 2: Aftercare long term use Anticoagulants V58.61,V58.83 Start date: 04/02/2004 Duration: Indefinite  Patient Assessment Reviewed by: Chancy Milroy PharmD  October 13, 2009 Medication review: verified warfarin dosage & schedule,verified previous prescription medications, verified doses & any changes, verified new medications, reviewed OTC medications, reviewed OTC health products-vitamins supplements etc Complications: none Dietary changes: none   Health status changes: none   Lifestyle changes: none   Recent/future hospitalizations: none   Recent/future procedures: none   Recent/future dental: none Patient Assessment Part 2:  Have you MISSED ANY DOSES or CHANGED TABLETS?  No missed Warfarin doses or changed tablets.  Have you had any BRUISING or BLEEDING ( nose or gum bleeds,blood in urine or stool)?  No reported bruising or bleeding in nose, gums, urine, stool.  Have you STARTED or STOPPED any MEDICATIONS, including OTC meds,herbals or supplements?  No other medications or herbal supplements were started or stopped.  Have you CHANGED your DIET, especially green vegetables,or ALCOHOL intake?  No changes in diet or alcohol intake.  Have you had any ILLNESSES or HOSPITALIZATIONS?  No reported illnesses or hospitalizations  Have you had any signs of CLOTTING?(chest discomfort,dizziness,shortness of breath,arms tingling,slurred speech,swelling or redness in leg)    No chest discomfort, dizziness, shortness of breath, tingling in arm, slurred speech, swelling, or redness in leg.     Treatment  Target INR: 2.5-3.5 INR: 3.9  Date: 10/13/2009 Regimen In:  78.75mg /week INR reflects regimen in: 3.9  New  Tablet strength: : 7.5mg  Regimen Out:     Sunday: 1 & 1/2 Tablet     Monday: 1 Tablet  Tuesday: 1 & 1/2 Tablet     Wednesday: 1 & 1/2 Tablet     Thursday: 1 & 1/2 Tablet      Friday: 1 & 1/2 Tablet     Saturday: 1 & 1/2 Tablet Total Weekly: 75.0mg/week mg  Next INR Due: 11/10/2009 Adjusted by: Karter Haire B. Morganna Styles III PharmD CACP   Return to anticoagulation clinic:  11/10/2009 Time of next visit: 1545    Allergies: 1)  ! Aspirin Prescriptions: WARFARIN SODIUM 7.5 MG TABS (WARFARIN SODIUM) Tablet Strength: 7.5mg Take as directed  #100 x 3   Entered by:   Jay Randie Bloodgood PharmD   Authorized by:   David Fitzgerald MD   Signed by:   Jay Dakayla Disanti PharmD on 10/13/2009   Method used:   Electronically to        [facility] [address] #[id number]* (retail)       [address]       [place], [place]  [address]       Ph: [phone]       Fax: [phone]   RxID:   [id number]

## 2010-09-01 NOTE — Assessment & Plan Note (Signed)
Summary: COU/CH  Anticoagulant Therapy Managed by: Barbera Setters. Janie Morning  PharmD CACP PCP: [name] MD [facility] AttendingCoralee Pesa MD, Levada Schilling Indication 1: Pulmonary  embolus Indication 2: Aftercare long term use Anticoagulants V58.61,V58.83 Start date: 04/02/2004 Duration: Indefinite  Patient Assessment Reviewed by: Chancy Milroy PharmD  Dec 08, 2009 Medication review: verified warfarin dosage & schedule,verified previous prescription medications, verified doses & any changes, verified new medications, reviewed OTC medications, reviewed OTC health products-vitamins supplements etc Complications: none Dietary changes: none   Health status changes: none   Lifestyle changes: none   Recent/future hospitalizations: none   Recent/future procedures: none   Recent/future dental: none Patient Assessment Part 2:  Have you MISSED ANY DOSES or CHANGED TABLETS?  No missed Warfarin doses or changed tablets.  Have you had any BRUISING or BLEEDING ( nose or gum bleeds,blood in urine or stool)?  No reported bruising or bleeding in nose, gums, urine, stool.  Have you STARTED or STOPPED any MEDICATIONS, including OTC meds,herbals or supplements?  No other medications or herbal supplements were started or stopped.  Have you CHANGED your DIET, especially green vegetables,or ALCOHOL intake?  No changes in diet or alcohol intake.  Have you had any ILLNESSES or HOSPITALIZATIONS?  No reported illnesses or hospitalizations  Have you had any signs of CLOTTING?(chest discomfort,dizziness,shortness of breath,arms tingling,slurred speech,swelling or redness in leg)    No chest discomfort, dizziness, shortness of breath, tingling in arm, slurred speech, swelling, or redness in leg.     Treatment  Target INR: 2.5-3.5 INR: 3.4  Date: 12/08/2009 Regimen In:  75.0mg /week INR reflects regimen in: 3.4  New  Tablet strength: : 7.5mg  Regimen Out:     Sunday: 1 & 1/2 Tablet     Monday: 1 & 1/2 Tablet    Tuesday: 1 & 1/2 Tablet     Wednesday: 1 Tablet     Thursday: 1 & 1/2 Tablet      Friday: 1 & 1/2 Tablet     Saturday: 1 & 1/2 Tablet Total Weekly: 75.0mg /week mg  Next INR Due: 01/05/2010 Adjusted by: Barbera Setters. Alexandria Lodge III PharmD CACP   Return to anticoagulation clinic:  01/05/2010 Time of next visit: 1530    Allergies: 1)  ! Aspirin

## 2010-09-01 NOTE — Assessment & Plan Note (Signed)
Summary: FU VISIT/DS   Vital Signs:  Patient profile:   40 year old female Height:      67 inches (170.18 cm) Weight:      194.8 pounds (88.55 kg) BMI:     30.62 Temp:     97.8 degrees F oral Pulse rate:   96 / minute BP sitting:   120 / 79  (right arm)  Vitals Entered By: Chinita Pester RN (Dec 17, 2009 1:37 PM) CC: F/U visit. Darkened area on right ankle/itching x 2-3 weeks. Back and lower abd/groins soreness. Is Patient Diabetic? No Pain Assessment Patient in pain? no      Nutritional Status BMI of > 30 = obese  Have you ever been in a relationship where you felt threatened, hurt or afraid?No   Does patient need assistance? Functional Status Self care Ambulation Normal   Primary Care Provider:  [name] MD  CC:  F/U visit. Darkened area on right ankle/itching x 2-3 weeks. Back and lower abd/groins soreness.Marland Kitchen  History of Present Illness: Mrs Martinek is a 40 yo woman with PMH as outlined below.  She is here with multiple complaints for the last week.  1.  Feels febrile but has not checked temperature 2.  Occasional pain in bilateral inguinal area upon standing.  can last as long as 2-3 days prior to resolving.  cannot describe any further.  However, this has been present since her pulmonary angiogram years ago.  has a h/o mirena IUD implantation. 3.  c/o pain in mid back, localized without radiation.  bending over/slouching for prolonged period worsens it. 4.  also has lesion distal to left ankle that itches.    She is married and denies any domestic abuse, vaginal discharge, abnormal bleeding.  All other systems reviewed and negative.  Depression History:      The patient denies a depressed mood most of the day and a diminished interest in her usual daily activities.         Preventive Screening-Counseling & Management  Alcohol-Tobacco     Alcohol drinks/day: 0     Smoking Status: never     Passive Smoke Exposure: no  Caffeine-Diet-Exercise     Does  Patient Exercise: no  Current Medications (verified): 1)  Zyrtec Allergy 10 Mg  Tabs (Cetirizine Hcl) .Marland Kitchen.. 1 Tab By Mouth Once Daily Prn 2)  Warfarin Sodium 7.5 Mg Tabs (Warfarin Sodium) .... Tablet Strength: 7.5mg  Take As Directed 3)  Spironolactone 25 Mg Tabs (Spironolactone) .... Take 1 Tablet By Mouth Once A Day 4)  Ocean Nasal Spray 0.65 % Soln (Saline) .... One Spray in Each Nostril Every Six Hours 5)  Tylenol 325 Mg Tabs (Acetaminophen) .... One Tablet Every 8 Hours As Needed  Allergies (verified): 1)  ! Aspirin  Past History:  Risk Factors: Smoking Status: never (12/17/2009) Passive Smoke Exposure: no (12/17/2009)  Past Medical History: primary hypercoagulable state DVT PE  pulmonary HTN cor pulmonale MIRENA IUD  Past Surgical History: pulmonary artery endarterectomy  Review of Systems      See HPI  Physical Exam  General:  alert and overweight-appearing.   Eyes:  vision grossly intact, pupils equal, pupils round, and pupils reactive to light.   Mouth:  pharynx pink and moist and no lesions.   Neck:  supple, full ROM, and no masses.   Lungs:  Normal respiratory effort, chest expands symmetrically. Lungs are clear to auscultation, no crackles or wheezes. Heart:  Normal rate and regular rhythm. S1 and S2 normal  without gallop, murmur, click, rub or other extra sounds. Abdomen:  soft, non-tender, normal bowel sounds, no distention, no masses, no guarding, and no rigidity.   Extremities:  right leg chronically larger  in diameter than left (and more painful) Neurologic:  alert & oriented X3, cranial nerves II-XII intact, strength normal in all extremities, and gait normal.   Skin:  there is a 3 cm circular area just distal to the right lateral maleolus with dry, flaking skin.   Impression & Recommendations:  Problem # 1:  MALAISE (ICD-780.79) Unfortunately, history does not point me in any specific diagnosis Will start with basic w/u CBC for signs of infection,  CMET to assess renal and liver function, ESR/CRP for inflammatory process (if elevated will further assess for possible infections and autoimmune process), TSH, UA with culture for possible infection, HIV (would consider viral load if symptoms persists and guided by above results), and UPT (though mirena IUD in place).  Orders: T-Comprehensive Metabolic Panel [id number]) T-CBC w/Diff [id number]) T-Sed Rate (Automated) [id number]) T-C-Reactive Protein [id number]) T-TSH [id number]) T-Urinalysis ([id number]) T-Culture, Urine ([id number]) T-HIV Antibody  (Reflex) [id number]) T-Urine Pregnancy (in -house) ([id number])  Problem # 2:  TINEA PEDIS (ICD-110.4) will treat with lotrisone to assist with symptoms as well  Her updated medication list for this problem includes:    Lotrisone 1-0.05 % Crea (Clotrimazole-betamethasone) .Marland Kitchen... Apply to affected area two times a day  Problem # 3:  HYPERCOAGULABLE STATE, PRIMARY (ICD-289.81) On chronic coumadin followed by Dr. Alexandria Lodge, last INR therapeutic  Complete Medication List: 1)  Zyrtec Allergy 10 Mg Tabs (Cetirizine hcl) .Marland Kitchen.. 1 tab by mouth once daily prn 2)  Warfarin Sodium 7.5 Mg Tabs (Warfarin sodium) .... Tablet strength: 7.5mg  take as directed 3)  Spironolactone 25 Mg Tabs (Spironolactone) .... Take 1 tablet by mouth once a day 4)  Ocean Nasal Spray 0.65 % Soln (Saline) .... One spray in each nostril every six hours 5)  Tylenol 325 Mg Tabs (Acetaminophen) .... One tablet every 8 hours as needed 6)  Lotrisone 1-0.05 % Crea (Clotrimazole-betamethasone) .... Apply to affected area two times a day  Patient Instructions: 1)  Please schedule a follow-up appointment in 1 month. 2)  Will check labs today as discussed, if there is any problem we will call you. 3)  Can use tylenol for back pain and headache. 4)  Use lotrisone as prescribed, if too expensive, can buy clotrimazole 1% in pharmacy and apply to ankle two times a day. 5)  If you  have any new or worsening symptoms, call clinic.  Prescriptions: LOTRISONE 1-0.05 % CREA (CLOTRIMAZOLE-BETAMETHASONE) apply to affected area two times a day  #45gm x 0   Entered and Authorized by:   [name] MD   Signed by:   [name] MD on 12/17/2009   Method used:   Electronically to        [facility] [address] #[id number]* (retail)       [address] [place], [place]  [address]       Ph: [phone]       Fax: [phone]   RxID:   [id number]   Prevention & Chronic Care Immunizations   Influenza vaccine: Not documented   Influenza vaccine deferral: Deferred  (12/17/2009)    Tetanus booster: Not documented   Td booster deferral: Deferred  (12/17/2009)    Pneumococcal vaccine: Not documented  Other Screening   Pap smear: NEGATIVE FOR INTRAEPITHELIAL LESIONS OR MALIGNANCY.  (11/21/2008)  Mammogram: ASSESSMENT: Negative - BI-RADS 1^MM DIGITAL SCREENING  (03/26/2009)   Mammogram action/deferral: Ordered  (03/04/2009)   Smoking status: never  (12/17/2009)  Lipids   Total Cholesterol: 215  (03/04/2009)   LDL: 143  (03/04/2009)   LDL Direct: Not documented   HDL: 47  (03/04/2009)   Triglycerides: 124  (03/04/2009)      Resource handout printed.  Process Orders Check Orders Results:     [facility]: ABN not required for this insurance Tests Sent for requisitioning (Dec 17, 2009 2:50 PM):     12/17/2009: [facility] -- T-Comprehensive Metabolic Panel [[id number]] (signed)     12/17/2009: [facility] -- T-CBC w/Diff [[id number]] (signed)     12/17/2009: [facility] -- T-Sed Rate (Automated) [id number] (signed)     12/17/2009: [facility] -- T-C-Reactive Protein [id number] (signed)     12/17/2009: [facility] -- T-TSH [id number] (signed)     12/17/2009: [facility] -- T-Urinalysis [[id number]] (signed)     12/17/2009:  [facility] -- T-Culture, Urine [[id number]] (signed)     12/17/2009: [facility] -- T-HIV Antibody  (Reflex) [[id number]] (signed)    Appended Document: Results of Urine Pregnancy    Lab Visit  Laboratory Results   Urine Tests  Date/Time Received: Dec 17, 2009 3:03 PM  Date/Time Reported: Burke Keels  Dec 17, 2009 3:03 PM     Urine HCG: negative Comments: Urine Specific Gravity 1.014  Burke Keels  Dec 17, 2009 3:03 PM    Orders Today:

## 2010-09-01 NOTE — Assessment & Plan Note (Signed)
Summary: LABS/VS  Anticoagulant Therapy Managed by: Barbera Setters. [name]  PharmD CACP PCP: Gina Stable MD [facility] Attending: Lowella Bandy MD Indication 1: Pulmonary  embolus Indication 2: Aftercare long term use Anticoagulants V58.61,V58.83 Start date: 04/02/2004 Duration: Indefinite  Patient Assessment Reviewed by: Chancy Milroy PharmD  November 10, 2009 Medication review: verified warfarin dosage & schedule,verified previous prescription medications, verified doses & any changes, verified new medications, reviewed OTC medications, reviewed OTC health products-vitamins supplements etc Complications: none Dietary changes: none   Health status changes: none   Lifestyle changes: none   Recent/future hospitalizations: none   Recent/future procedures: none   Recent/future dental: none Patient Assessment Part 2:  Have you MISSED ANY DOSES or CHANGED TABLETS?  No missed Warfarin doses or changed tablets.  Have you had any BRUISING or BLEEDING ( nose or gum bleeds,blood in urine or stool)?  No reported bruising or bleeding in nose, gums, urine, stool.  Have you STARTED or STOPPED any MEDICATIONS, including OTC meds,herbals or supplements?  No other medications or herbal supplements were started or stopped.  Have you CHANGED your DIET, especially green vegetables,or ALCOHOL intake?  No changes in diet or alcohol intake.  Have you had any ILLNESSES or HOSPITALIZATIONS?  No reported illnesses or hospitalizations  Have you had any signs of CLOTTING?(chest discomfort,dizziness,shortness of breath,arms tingling,slurred speech,swelling or redness in leg)    No chest discomfort, dizziness, shortness of breath, tingling in arm, slurred speech, swelling, or redness in leg.     Treatment  Target INR: 2.5-3.5 INR: 3.0  Date: 11/10/2009 Regimen In:  75.0mg /week INR reflects regimen in: 3.0  New  Tablet strength: : 7.5mg  Regimen Out:     Sunday: 1 & 1/2 Tablet     Monday: 1 & 1/2 Tablet    Tuesday: 1 & 1/2 Tablet     Wednesday: 1 Tablet     Thursday: 1 & 1/2 Tablet      Friday: 1 & 1/2 Tablet     Saturday: 1 & 1/2 Tablet Total Weekly: 75.0mg /week mg  Next INR Due: 12/08/2009 Adjusted by: Barbera Setters. Alexandria Lodge III PharmD CACP   Return to anticoagulation clinic:  12/08/2009 Time of next visit: 1600    Allergies: 1)  ! Aspirin

## 2010-09-01 NOTE — Assessment & Plan Note (Signed)
Summary: COU/CH  Anticoagulant Therapy Managed by: Barbera Setters. Janie Morning  PharmD CACP PCP: [name] MD Indication 1: Pulmonary  embolus Indication 2: Aftercare long term use Anticoagulants V58.61,V58.83 Start date: 04/02/2004 Duration: Indefinite  Patient Assessment Reviewed by: Chancy Milroy PharmD  February 03, 2010 Medication review: verified warfarin dosage & schedule,verified previous prescription medications, verified doses & any changes, verified new medications, reviewed OTC medications, reviewed OTC health products-vitamins supplements etc Complications: none Dietary changes: none   Health status changes: none   Lifestyle changes: none   Recent/future hospitalizations: none   Recent/future procedures: none   Recent/future dental: none Patient Assessment Part 2:  Have you MISSED ANY DOSES or CHANGED TABLETS?  No missed Warfarin doses or changed tablets.  Have you had any BRUISING or BLEEDING ( nose or gum bleeds,blood in urine or stool)?  No reported bruising or bleeding in nose, gums, urine, stool.  Have you STARTED or STOPPED any MEDICATIONS, including OTC meds,herbals or supplements?  No other medications or herbal supplements were started or stopped.  Have you CHANGED your DIET, especially green vegetables,or ALCOHOL intake?  No changes in diet or alcohol intake.  Have you had any ILLNESSES or HOSPITALIZATIONS?  No reported illnesses or hospitalizations  Have you had any signs of CLOTTING?(chest discomfort,dizziness,shortness of breath,arms tingling,slurred speech,swelling or redness in leg)    No chest discomfort, dizziness, shortness of breath, tingling in arm, slurred speech, swelling, or redness in leg.     Treatment  Target INR: 2.5-3.5 INR: 3.3  Date: 01/05/2010 Regimen In:  75.0mg /week INR reflects regimen in: 3.3  New  Tablet strength: : 7.5mg  Regimen Out:     Sunday: 1 & 1/2 Tablet     Monday: 1 & 1/2 Tablet     Tuesday: 1 & 1/2 Tablet  Wednesday: 1 Tablet     Thursday: 1 & 1/2 Tablet      Friday: 1 & 1/2 Tablet     Saturday: 1 & 1/2 Tablet Total Weekly: 75.0mg /week mg  Next INR Due: 02/09/2010 Adjusted by: Barbera Setters. Alexandria Lodge III PharmD CACP   Return to anticoagulation clinic:  02/09/2010 Time of next visit: 0900    Allergies: 1)  ! Aspirin

## 2010-09-01 NOTE — Assessment & Plan Note (Signed)
Summary: COU/CH  Anticoagulant Therapy Managed by: Barbera Setters. [name]  PharmD CACP PCP: [name] MD [facility] Attending: Rogelia Boga MD, Lanora Manis Indication 1: Pulmonary  embolus Indication 2: Aftercare long term use Anticoagulants V58.61,V58.83 Start date: 04/02/2004 Duration: Indefinite  Patient Assessment Reviewed by: Chancy Milroy PharmD  May 25, 2010 Medication review: verified warfarin dosage & schedule,verified previous prescription medications, verified doses & any changes, verified new medications, reviewed OTC medications, reviewed OTC health products-vitamins supplements etc Complications: none Dietary changes: none   Health status changes: none   Lifestyle changes: none   Recent/future hospitalizations: none   Recent/future procedures: none   Recent/future dental: none Patient Assessment Part 2:  Have you MISSED ANY DOSES or CHANGED TABLETS?  No missed Warfarin doses or changed tablets.  Have you had any BRUISING or BLEEDING ( nose or gum bleeds,blood in urine or stool)?  No reported bruising or bleeding in nose, gums, urine, stool.  Have you STARTED or STOPPED any MEDICATIONS, including OTC meds,herbals or supplements?  No other medications or herbal supplements were started or stopped.  Have you CHANGED your DIET, especially green vegetables,or ALCOHOL intake?  No changes in diet or alcohol intake.  Have you had any ILLNESSES or HOSPITALIZATIONS?  No reported illnesses or hospitalizations  Have you had any signs of CLOTTING?(chest discomfort,dizziness,shortness of breath,arms tingling,slurred speech,swelling or redness in leg)    No chest discomfort, dizziness, shortness of breath, tingling in arm, slurred speech, swelling, or redness in leg.     Treatment  Target INR: 2.5-3.5 INR: 2.5  Date: 05/25/2010 Regimen In:  71.25mg /week INR reflects regimen in: 2.5  New  Tablet strength: : 7.5mg  Regimen Out:     Sunday: 1 & 1/2 Tablet     Monday: 1 & 1/2  Tablet     Tuesday: 1 & 1/2 Tablet     Wednesday: 1 Tablet     Thursday: 1 & 1/2 Tablet      Friday: 1 & 1/2 Tablet     Saturday: 1 & 1/2 Tablet Total Weekly: 75.0mg /week mg  Next INR Due: 06/22/2010 Adjusted by: Barbera Setters. Alexandria Lodge III PharmD CACP   Return to anticoagulation clinic:  06/22/2010 Time of next visit: 1415    Allergies: 1)  ! Aspirin

## 2010-09-01 NOTE — Assessment & Plan Note (Signed)
Summary: 261/DS  Anticoagulant Therapy Managed by: Barbera Setters. Janie Morning  PharmD CACP PCP: [name] MD [facility] Attending: Darl Pikes, Beth Indication 1: Pulmonary  embolus Indication 2: Aftercare long term use Anticoagulants V58.61,V58.83 Start date: 04/02/2004 Duration: Indefinite  Patient Assessment Reviewed by: Chancy Milroy PharmD  April 20, 2010 Medication review: verified warfarin dosage & schedule,verified previous prescription medications, verified doses & any changes, verified new medications, reviewed OTC medications, reviewed OTC health products-vitamins supplements etc Complications: none Dietary changes: none   Health status changes: none   Lifestyle changes: none   Recent/future hospitalizations: none   Recent/future procedures: none   Recent/future dental: none Patient Assessment Part 2:  Have you MISSED ANY DOSES or CHANGED TABLETS?  No missed Warfarin doses or changed tablets.  Have you had any BRUISING or BLEEDING ( nose or gum bleeds,blood in urine or stool)?  No reported bruising or bleeding in nose, gums, urine, stool.  Have you STARTED or STOPPED any MEDICATIONS, including OTC meds,herbals or supplements?  No other medications or herbal supplements were started or stopped.  Have you CHANGED your DIET, especially green vegetables,or ALCOHOL intake?  No changes in diet or alcohol intake.  Have you had any ILLNESSES or HOSPITALIZATIONS?  No reported illnesses or hospitalizations  Have you had any signs of CLOTTING?(chest discomfort,dizziness,shortness of breath,arms tingling,slurred speech,swelling or redness in leg)    No chest discomfort, dizziness, shortness of breath, tingling in arm, slurred speech, swelling, or redness in leg.     Treatment  Target INR: 2.5-3.5 INR: 3.7  Date: 04/20/2010 Regimen In:  78.75mg /week INR reflects regimen in: 3.7  New  Tablet strength: : 7.5mg  Regimen Out:     Sunday: 1 & 1/2 Tablet     Monday: 1 Tablet  Tuesday: 1 & 1/2 Tablet     Wednesday: 1 & 1/2 Tablet     Thursday: 1 Tablet      Friday: 1 & 1/2 Tablet     Saturday: 1 & 1/2 Tablet Total Weekly: 71.25mg /week mg  Next INR Due: 05/18/2010 Adjusted by: Barbera Setters. Alexandria Lodge III PharmD CACP   Return to anticoagulation clinic:  05/18/2010 Time of next visit: 1445    Allergies: 1)  ! Aspirin

## 2010-09-01 NOTE — Progress Notes (Signed)
----   Converted from flag ---- ---- 12/24/2009 10:13 AM, Chinita Pester RN wrote: Pt. was called; she will come in next week.  ---- 12/24/2009 8:35 AM, [name] MD wrote: well good.  can we have her come in for a repeat UA after her period is done.  i have put the order in.  thanks, manny  ---- 12/23/2009 4:39 PM, Chinita Pester RN wrote: Yes she was on her period.  ---- 12/23/2009 2:48 PM, [name] MD wrote: Cleone Slim, can you call Mrs Sen and ask if she was on her period when she provided [place] with the urine sample?  thanks, manny ------------------------------

## 2010-09-01 NOTE — Assessment & Plan Note (Signed)
Summary: COU/VS  Anticoagulant Therapy Managed by: Barbera Setters. Janie Morning  PharmD CACP PCP: [name] MD [facility] Attending: Sampson Goon MD, David Indication 1: Pulmonary  embolus Indication 2: Aftercare long term use Anticoagulants V58.61,V58.83 Start date: 04/02/2004 Duration: Indefinite  Patient Assessment Reviewed by: Chancy Milroy PharmD  October 13, 2009 Medication review: verified warfarin dosage & schedule,verified previous prescription medications, verified doses & any changes, verified new medications, reviewed OTC medications, reviewed OTC health products-vitamins supplements etc Complications: none Dietary changes: none   Health status changes: none   Lifestyle changes: none   Recent/future hospitalizations: none   Recent/future procedures: none   Recent/future dental: none Patient Assessment Part 2:  Have you MISSED ANY DOSES or CHANGED TABLETS?  No missed Warfarin doses or changed tablets.  Have you had any BRUISING or BLEEDING ( nose or gum bleeds,blood in urine or stool)?  No reported bruising or bleeding in nose, gums, urine, stool.  Have you STARTED or STOPPED any MEDICATIONS, including OTC meds,herbals or supplements?  No other medications or herbal supplements were started or stopped.  Have you CHANGED your DIET, especially green vegetables,or ALCOHOL intake?  No changes in diet or alcohol intake.  Have you had any ILLNESSES or HOSPITALIZATIONS?  No reported illnesses or hospitalizations  Have you had any signs of CLOTTING?(chest discomfort,dizziness,shortness of breath,arms tingling,slurred speech,swelling or redness in leg)    No chest discomfort, dizziness, shortness of breath, tingling in arm, slurred speech, swelling, or redness in leg.     Treatment  Target INR: 2.5-3.5 INR: 3.3  Date: 09/15/2009 Regimen In:  78.75mg /week INR reflects regimen in: 3.3  New  Tablet strength: : 7.5mg  Regimen Out:     Sunday: 1 & 1/2 Tablet     Monday: 1 & 1/2  Tablet     Tuesday: 1 & 1/2 Tablet     Wednesday: 1 & 1/2 Tablet     Thursday: 1 & 1/2 Tablet      Friday: 1 & 1/2 Tablet     Saturday: 1 & 1/2 Tablet Total Weekly: 78.75mg /week mg  Next INR Due: 10/13/2009 Adjusted by: Barbera Setters. Alexandria Lodge III PharmD CACP   Return to anticoagulation clinic:  10/13/2009 Time of next visit: 1100    Allergies: 1)  ! Aspirin

## 2010-09-01 NOTE — Assessment & Plan Note (Signed)
Summary: COU/CH  Anticoagulant Therapy Managed by: Barbera Setters. Janie Morning  PharmD CACP PCP: [name] MD [facility] Attending: Lowella Bandy MD Indication 1: Pulmonary  embolus Indication 2: Aftercare long term use Anticoagulants V58.61,V58.83 Start date: 04/02/2004 Duration: Indefinite  Patient Assessment Reviewed by: Chancy Milroy PharmD  February 09, 2010 Medication review: verified warfarin dosage & schedule,verified previous prescription medications, verified doses & any changes, verified new medications, reviewed OTC medications, reviewed OTC health products-vitamins supplements etc Complications: none Dietary changes: none   Health status changes: none   Lifestyle changes: none   Recent/future hospitalizations: none   Recent/future procedures: none   Recent/future dental: none Patient Assessment Part 2:  Have you MISSED ANY DOSES or CHANGED TABLETS?  No missed Warfarin doses or changed tablets.  Have you had any BRUISING or BLEEDING ( nose or gum bleeds,blood in urine or stool)?  No reported bruising or bleeding in nose, gums, urine, stool.  Have you STARTED or STOPPED any MEDICATIONS, including OTC meds,herbals or supplements?  No other medications or herbal supplements were started or stopped.  Have you CHANGED your DIET, especially green vegetables,or ALCOHOL intake?  No changes in diet or alcohol intake.  Have you had any ILLNESSES or HOSPITALIZATIONS?  No reported illnesses or hospitalizations  Have you had any signs of CLOTTING?(chest discomfort,dizziness,shortness of breath,arms tingling,slurred speech,swelling or redness in leg)    No chest discomfort, dizziness, shortness of breath, tingling in arm, slurred speech, swelling, or redness in leg.     Treatment  Target INR: 2.5-3.5 INR: 2.5  Date: 02/09/2010 Regimen In:  75.0mg /week INR reflects regimen in: 2.5  New  Tablet strength: : 7.5mg  Regimen Out:     Sunday: 1 & 1/2 Tablet     Monday: 1 & 1/2 Tablet  Tuesday: 1 & 1/2 Tablet     Wednesday: 1 & 1/2 Tablet     Thursday: 1 & 1/2 Tablet      Friday: 1 & 1/2 Tablet     Saturday: 1 & 1/2 Tablet Total Weekly: 78.75mg /week mg  Next INR Due: 03/09/2010 Adjusted by: Barbera Setters. Alexandria Lodge III PharmD CACP   Return to anticoagulation clinic:  03/09/2010 Time of next visit: 1445    Allergies: 1)  ! Aspirin

## 2010-09-01 NOTE — Assessment & Plan Note (Signed)
Summary: 145PM COU APPT/CH  Anticoagulant Therapy Managed by: Barbera Setters. Janie Morning  PharmD CACP PCP: [name] MD [facility] Attending: Rogelia Boga MD, Lanora Manis Indication 1: Pulmonary  embolus Indication 2: Aftercare long term use Anticoagulants V58.61,V58.83 Start date: 04/02/2004 Duration: Indefinite  Patient Assessment Reviewed by: Chancy Milroy PharmD  March 09, 2010 Medication review: verified warfarin dosage & schedule,verified previous prescription medications, verified doses & any changes, verified new medications, reviewed OTC medications, reviewed OTC health products-vitamins supplements etc Complications: none Dietary changes: none   Health status changes: none   Lifestyle changes: none   Recent/future hospitalizations: none   Recent/future procedures: none   Recent/future dental: none Patient Assessment Part 2:  Have you MISSED ANY DOSES or CHANGED TABLETS?  No missed Warfarin doses or changed tablets.  Have you had any BRUISING or BLEEDING ( nose or gum bleeds,blood in urine or stool)?  No reported bruising or bleeding in nose, gums, urine, stool.  Have you STARTED or STOPPED any MEDICATIONS, including OTC meds,herbals or supplements?  No other medications or herbal supplements were started or stopped.  Have you CHANGED your DIET, especially green vegetables,or ALCOHOL intake?  No changes in diet or alcohol intake.  Have you had any ILLNESSES or HOSPITALIZATIONS?  No reported illnesses or hospitalizations  Have you had any signs of CLOTTING?(chest discomfort,dizziness,shortness of breath,arms tingling,slurred speech,swelling or redness in leg)    No chest discomfort, dizziness, shortness of breath, tingling in arm, slurred speech, swelling, or redness in leg.     Treatment  Target INR: 2.5-3.5 INR: 2.3  Date: 03/09/2010 Regimen In:  78.75mg /week INR reflects regimen in: 2.3  New  Tablet strength: : 10mg  Regimen Out:     Sunday: 1 & 1/2 Tablet     Monday: 1  & 1/2 Tablet     Tuesday: 1 & 1/2 Tablet     Wednesday: 2 Tablet     Thursday: 1 & 1/2 Tablet      Friday: 1 & 1/2 Tablet     Saturday: 1 & 1/2 Tablet Total Weekly: 82.5mg /week mg  Next INR Due: 03/23/2010 Adjusted by: Barbera Setters. Alexandria Lodge III PharmD CACP   Return to anticoagulation clinic:  03/23/2010 Time of next visit: 1500    Allergies: 1)  ! Aspirin

## 2010-09-01 NOTE — Progress Notes (Signed)
Summary: refill/gg  Phone Note Refill Request  on March 09, 2010 3:24 PM  Refills Requested: Medication #1:  SPIRONOLACTONE 25 MG TABS Take 1 tablet by mouth once a day  Method Requested: Electronic Initial call taken by: Merrie Roof RN,  March 09, 2010 3:24 PM  Follow-up for Phone Call        Rx faxed to pharmacy Follow-up by: [name] MD,  March 09, 2010 3:42 PM    Prescriptions: SPIRONOLACTONE 25 MG TABS (SPIRONOLACTONE) Take 1 tablet by mouth once a day  #90 x 4   Entered and Authorized by:   [name] MD   Signed by:   [name] MD on 03/09/2010   Method used:   Faxed to ...       [facility] (mail-order)       [address]       [place], [place]  [address]       Ph: [phone]       Fax: [phone]   RxID:   [id number]

## 2010-09-03 ENCOUNTER — Emergency Department (HOSPITAL_COMMUNITY)
Admission: EM | Admit: 2010-09-03 | Discharge: 2010-09-03 | Disposition: A | Discharge status: Home / Self Care | Payer: Commercial Managed Care - PPO | Attending: Emergency Medicine | Admitting: Emergency Medicine

## 2010-09-03 ENCOUNTER — Ambulatory Visit: Payer: Self-pay | Admitting: Obstetrics & Gynecology

## 2010-09-03 ENCOUNTER — Emergency Department (HOSPITAL_COMMUNITY): Payer: Commercial Managed Care - PPO

## 2010-09-03 ENCOUNTER — Ambulatory Visit: Admit: 2010-09-03 | Payer: Self-pay | Admitting: Obstetrics and Gynecology

## 2010-09-03 DIAGNOSIS — Z9889 Other specified postprocedural states: Secondary | ICD-10-CM | POA: Insufficient documentation

## 2010-09-03 DIAGNOSIS — F410 Panic disorder [episodic paroxysmal anxiety] without agoraphobia: Secondary | ICD-10-CM | POA: Insufficient documentation

## 2010-09-03 DIAGNOSIS — F411 Generalized anxiety disorder: Secondary | ICD-10-CM | POA: Insufficient documentation

## 2010-09-03 DIAGNOSIS — R0989 Other specified symptoms and signs involving the circulatory and respiratory systems: Secondary | ICD-10-CM | POA: Insufficient documentation

## 2010-09-03 DIAGNOSIS — R071 Chest pain on breathing: Secondary | ICD-10-CM | POA: Insufficient documentation

## 2010-09-03 DIAGNOSIS — Z86711 Personal history of pulmonary embolism: Secondary | ICD-10-CM | POA: Insufficient documentation

## 2010-09-03 DIAGNOSIS — R0609 Other forms of dyspnea: Secondary | ICD-10-CM | POA: Insufficient documentation

## 2010-09-03 DIAGNOSIS — R51 Headache: Secondary | ICD-10-CM | POA: Insufficient documentation

## 2010-09-03 LAB — CBC
HCT: 41.7 % (ref 36.0–46.0)
Hemoglobin: 14.4 g/dL (ref 12.0–15.0)
MCHC: 34.5 g/dL (ref 30.0–36.0)
MCV: 84.8 fL (ref 78.0–100.0)

## 2010-09-03 LAB — BASIC METABOLIC PANEL
BUN: 9 mg/dL (ref 6–23)
Chloride: 105 mEq/L (ref 96–112)
Potassium: 3.7 mEq/L (ref 3.5–5.1)

## 2010-09-03 LAB — PROTIME-INR: INR: 3.6 — ABNORMAL HIGH (ref 0.00–1.49)

## 2010-09-03 LAB — POCT CARDIAC MARKERS
Myoglobin, poc: 180 ng/mL (ref 12–200)
Troponin i, poc: <0.05 ng/mL (ref 0.00–0.09)

## 2010-09-03 LAB — POCT PREGNANCY, URINE: Preg Test, Ur: NEGATIVE

## 2010-09-03 NOTE — Assessment & Plan Note (Signed)
Summary: COU/CH  Anticoagulant Therapy Managed by: Barbera Setters. Janie Morning  PharmD CACP PCP: [name] MD [facility] Attending: Margarito Liner MD Indication 1: Pulmonary  embolus Indication 2: Aftercare long term use Anticoagulants V58.61,V58.83 Start date: 04/02/2004 Duration: Indefinite  Patient Assessment Reviewed by: Chancy Milroy PharmD  August 04, 2010 Medication review: verified warfarin dosage & schedule,verified previous prescription medications, verified doses & any changes, verified new medications, reviewed OTC medications, reviewed OTC health products-vitamins supplements etc Complications: none Dietary changes: none   Health status changes: none   Lifestyle changes: none   Recent/future hospitalizations: none   Recent/future procedures: none   Recent/future dental: none Patient Assessment Part 2:  Have you MISSED ANY DOSES or CHANGED TABLETS?  No missed Warfarin doses or changed tablets.  Have you had any BRUISING or BLEEDING ( nose or gum bleeds,blood in urine or stool)?  No reported bruising or bleeding in nose, gums, urine, stool.  Have you STARTED or STOPPED any MEDICATIONS, including OTC meds,herbals or supplements?  No other medications or herbal supplements were started or stopped.  Have you CHANGED your DIET, especially green vegetables,or ALCOHOL intake?  No changes in diet or alcohol intake.  Have you had any ILLNESSES or HOSPITALIZATIONS?  No reported illnesses or hospitalizations  Have you had any signs of CLOTTING?(chest discomfort,dizziness,shortness of breath,arms tingling,slurred speech,swelling or redness in leg)    No chest discomfort, dizziness, shortness of breath, tingling in arm, slurred speech, swelling, or redness in leg.     Treatment  Target INR: 2.5-3.5 INR: 3.4  Date: 07/20/2010 Regimen In:  75.0mg /week INR reflects regimen in: 3.4  New  Tablet strength: : 7.5mg  Regimen Out:     Sunday: 1 & 1/2 Tablet     Monday: 1 & 1/2 Tablet   Tuesday: 1 & 1/2 Tablet     Wednesday: 1 Tablet     Thursday: 1 & 1/2 Tablet      Friday: 1 & 1/2 Tablet     Saturday: 1 & 1/2 Tablet Total Weekly: 75.0mg /week mg  Next INR Due: 08/24/2010 Adjusted by: Barbera Setters. Alexandria Lodge III PharmD CACP   Return to anticoagulation clinic:  08/24/2010 Time of next visit: 1100    Allergies: 1)  ! Aspirin

## 2010-09-07 ENCOUNTER — Ambulatory Visit (INDEPENDENT_AMBULATORY_CARE_PROVIDER_SITE_OTHER): Payer: Commercial Managed Care - PPO | Admitting: Pharmacist

## 2010-09-07 DIAGNOSIS — D6859 Other primary thrombophilia: Secondary | ICD-10-CM

## 2010-09-07 DIAGNOSIS — I2699 Other pulmonary embolism without acute cor pulmonale: Secondary | ICD-10-CM

## 2010-09-07 DIAGNOSIS — Z7901 Long term (current) use of anticoagulants: Secondary | ICD-10-CM

## 2010-09-07 LAB — POCT INR: INR: 4.6

## 2010-09-07 MED ORDER — WARFARIN SODIUM 7.5 MG PO TABS: 7.5000 mg | ORAL_TABLET | ORAL | Status: DC

## 2010-09-07 NOTE — Progress Notes (Signed)
Anti-Coagulation Progress Note  [name] is a 40 y.o. female who is currently on an anti-coagulation regimen.    RECENT RESULTS: Most recent results are:  Lab Results  Component Value Date   INR 4.6 09/07/2010   INR 3.60* 09/03/2010   INR 3.4 07/20/2010  , based on a dose of 75mg . per week.  ANTI-COAG DOSE:   Latest dosing instructions   Total Sun Mon Tue Wed Thu Fri Sat   67.5 11.25 mg 7.5 mg 11.25 mg 7.5 mg 11.25 mg 7.5 mg 11.25 mg    (7.5 mg1.5) (7.5 mg1) (7.5 mg1.5) (7.5 mg1) (7.5 mg1.5) (7.5 mg1) (7.5 mg1.5)         ANTICOAG SUMMARY: Anticoagulation Episode Summary              Current INR goal  Next INR check 10/05/2010   INR from last check 4.6 (09/07/2010)     Weekly max dose (mg)  Target end date    Indications Long-term (current) use of anticoagulants, PE (pulmonary embolism), Hypercoagulable state, primary   INR check location  Preferred lab    Send INR reminders to [facility]   Comments        Provider Role Specialty Phone number   [facility]  Internal Medicine [phone]        ANTICOAG TODAY: Anticoagulation Summary as of 09/07/2010              INR goal      Selected INR 4.6 (09/07/2010) Next INR check 10/05/2010   Weekly max dose (mg)  Target end date    Indications Long-term (current) use of anticoagulants, PE (pulmonary embolism), Hypercoagulable state, primary    Anticoagulation Episode Summary              INR check location  Preferred lab    Send INR reminders to [facility]   Comments        Provider Role Specialty Phone number   [name]  Internal Medicine [phone]        PATIENT INSTRUCTIONS: Patient Instructions  Patient instructed to take medications as defined in the Anti-coagulation Track section of this encounter.  Patient instructed to take today's dose.  Patient verbalized understanding of these instructions.        FOLLOW-UP Return in 4 weeks (on 10/05/2010).  Gar Ponto Pharm.D.,  CACP

## 2010-09-07 NOTE — Patient Instructions (Signed)
Patient instructed to take medications as defined in the Anti-coagulation Track section of this encounter.  Patient instructed to take today's dose.  Patient verbalized understanding of these instructions.    

## 2010-09-09 ENCOUNTER — Ambulatory Visit: Payer: Commercial Managed Care - PPO

## 2010-09-09 NOTE — Assessment & Plan Note (Signed)
Summary: [facility]  Anticoagulant Therapy Managed by: Barbera Setters. Janie Morning  PharmD CACP PCP: [name] MD [facility] Attending: Lowella Bandy MD Indication 1: Pulmonary  embolus Indication 2: Aftercare long term use Anticoagulants V58.61,V58.83 Start date: 04/02/2004 Duration: Indefinite  Patient Assessment Reviewed by: Chancy Milroy PharmD  September 01, 2010 Medication review: verified warfarin dosage & schedule,verified previous prescription medications, verified doses & any changes, verified new medications, reviewed OTC medications, reviewed OTC health products-vitamins supplements etc Complications: none Dietary changes: none   Health status changes: none   Lifestyle changes: none   Recent/future hospitalizations: none   Recent/future procedures: none   Recent/future dental: none Patient Assessment Part 2:  Have you MISSED ANY DOSES or CHANGED TABLETS?  No missed Warfarin doses or changed tablets.  Have you had any BRUISING or BLEEDING ( nose or gum bleeds,blood in urine or stool)?  No reported bruising or bleeding in nose, gums, urine, stool.  Have you STARTED or STOPPED any MEDICATIONS, including OTC meds,herbals or supplements?  No other medications or herbal supplements were started or stopped.  Have you CHANGED your DIET, especially green vegetables,or ALCOHOL intake?  No changes in diet or alcohol intake.  Have you had any ILLNESSES or HOSPITALIZATIONS?  No reported illnesses or hospitalizations  Have you had any signs of CLOTTING?(chest discomfort,dizziness,shortness of breath,arms tingling,slurred speech,swelling or redness in leg)    No chest discomfort, dizziness, shortness of breath, tingling in arm, slurred speech, swelling, or redness in leg.     Treatment  Target INR: 2.5-3.5 INR: 3.4  Date: 09/01/2010 Regimen In:  75.0mg /week INR reflects regimen in: 3.4  New  Tablet strength: : 7.5mg  Regimen Out:     Sunday: 1 & 1/2 Tablet     Monday: 1 & 1/2  Tablet     Tuesday: 1 & 1/2 Tablet     Wednesday: 1 Tablet     Thursday: 1 & 1/2 Tablet      Friday: 1 & 1/2 Tablet     Saturday: 1 & 1/2 Tablet Total Weekly: 75.0mg /week mg  Next INR Due: 09/07/2010 Adjusted by: Barbera Setters. Alexandria Lodge III PharmD CACP   Return to anticoagulation clinic:  09/07/2010 Time of next visit: 1000    Allergies: 1)  ! Aspirin

## 2010-09-28 ENCOUNTER — Ambulatory Visit (INDEPENDENT_AMBULATORY_CARE_PROVIDER_SITE_OTHER): Payer: Commercial Managed Care - PPO | Admitting: Ophthalmology

## 2010-09-28 ENCOUNTER — Encounter: Payer: Self-pay | Admitting: Ophthalmology

## 2010-09-28 DIAGNOSIS — I2789 Other specified pulmonary heart diseases: Secondary | ICD-10-CM

## 2010-09-28 DIAGNOSIS — Z0000 Encounter for general adult medical examination without abnormal findings: Secondary | ICD-10-CM

## 2010-09-28 DIAGNOSIS — D6859 Other primary thrombophilia: Secondary | ICD-10-CM

## 2010-09-28 DIAGNOSIS — J309 Allergic rhinitis, unspecified: Secondary | ICD-10-CM

## 2010-09-28 MED ORDER — FLUTICASONE PROPIONATE 50 MCG/ACT NA SUSP
1.0000 | Freq: Every day | NASAL | Status: DC
Start: 2010-09-28 — End: 2012-04-10

## 2010-09-28 MED ORDER — SALINE SPRAY 0.65 % NA SOLN: 1.0000 | Freq: Four times a day (QID) | NASAL | Status: DC

## 2010-09-28 NOTE — Assessment & Plan Note (Signed)
The patient continues to follow with Dr. Alexandria Lodge monthly and is scheduled to see him next week.  Pt has have a very stable INR within goal over the last 4 months last INR 3.4.  (goal 2.5-3.5). Will defer further anticoagulation management to Dr. Alexandria Lodge for now.

## 2010-09-28 NOTE — Assessment & Plan Note (Signed)
Pt will continue to follow with her pulmonologist at [facility]. Pt currently denies any SOB, chest pain or LE edema.

## 2010-09-28 NOTE — Assessment & Plan Note (Signed)
Pt states that she has required treatment for her allergies since her immigration to the [place].  Pt is currently on zyrtec for this and feels that this is not working.  Given the patients recent URI the pts coryza is likely post-viral though there may be an allergic component. I have restarted the pts nasal saline spray today and will prescribe Flonase.

## 2010-09-28 NOTE — Patient Instructions (Signed)
Please continue to follow up with your doctor at [facility] as well as Dr. Alexandria Lodge.  I am refilling your nasal saline spray and will write you for flonase today.  Please return in 2-3 months to follow up with your regular doctor.

## 2010-09-28 NOTE — Assessment & Plan Note (Signed)
Pt will receive a flu shot today.

## 2010-09-28 NOTE — Progress Notes (Signed)
  Subjective:    Patient ID: [name], female    DOB: 1971/06/12, 40 y.o.   MRN: [id number]  HPI This is a 40 year old female with a history of primary hypercoagulable state status post multiple pulmonary embolisms (on chronic coumadin, pulmonary hypertension,  and cor pulmonale  who presented to the ED on February 2 with shortness of and is here for follow up. Pt followed up previously with with Dr. Janee Morn her pulmonologist at [facility] and he did not make any changes.  Since that time, pt has had complete resolution of her symptoms except for a recent URI.  This occurred about 1 week ago and pt has had icreased corryza and clear nasal discharge.  Pt has a long hx of allergic rhinitis since she immigrated here in 2000. Pt has been taking zyrtec but this is not helping. Pt has used flonase in the past and this has helped. Pt denies fevers or chills. Pt does endorse dryness and pruritis of her eyes, but denies sinus pain or cough.   Review of Systems  Constitutional: Negative for fever and chills.  Respiratory: Negative for cough and shortness of breath.   Cardiovascular: Negative for chest pain and palpitations.  Gastrointestinal: Negative for vomiting, diarrhea and constipation.       Objective:   Physical Exam  Constitutional: She appears well-developed and well-nourished.  HENT:  Head: Normocephalic and atraumatic.  Right Ear: External ear normal.  Left Ear: External ear normal.  Mouth/Throat: Oropharynx is clear and moist.       Bilateral boggy turbinates with mild erythema but no open lesions.   Eyes: Pupils are equal, round, and reactive to light.       Very mild bilateral conjunctival injection.   Cardiovascular: Normal rate, regular rhythm and intact distal pulses.  Exam reveals no gallop and no friction rub.   No murmur heard. Pulmonary/Chest: Effort normal and breath sounds normal. She has no wheezes. She has no rales.  Abdominal: Soft. Bowel sounds are normal. She exhibits  no distension. There is no tenderness.  Musculoskeletal: Normal range of motion.  Neurological: She is alert. No cranial nerve deficit.  Skin: No rash noted.        Current Outpatient Prescriptions on File Prior to Visit  Medication Sig Dispense Refill  . acetaminophen (TYLENOL) 325 MG tablet Take 650 mg by mouth every 8 (eight) hours as needed.        . cetirizine (ZYRTEC ALLERGY) 10 MG tablet Take 10 mg by mouth daily as needed.        Marland Kitchen spironolactone (ALDACTONE) 25 MG tablet Take 25 mg by mouth daily.        Marland Kitchen warfarin (COUMADIN) 7.5 MG tablet Take 1 tablet (7.5 mg total) by mouth as directed.  50 tablet  1  . clotrimazole-betamethasone (LOTRISONE) cream Apply topically 2 (two) times daily. To affected area       . DISCONTD: Saline (SODIUM CHLORIDE) 0.65 % SOLN 1 spray by Nasal route every 6 (six) hours.           Past Medical History  Diagnosis Date  . Acute sinusitis   . Malaise   . Therapeutic drug monitoring   . Vaginal pruritus   . Acute and chronic respiratory failure   . Lumbar back pain   . Pulmonary embolism   . Pulmonary hypertension   . Primary hypercoagulable state   . Hepatitis B infection      Assessment & Plan:

## 2010-10-05 ENCOUNTER — Ambulatory Visit (INDEPENDENT_AMBULATORY_CARE_PROVIDER_SITE_OTHER): Payer: Commercial Managed Care - PPO | Admitting: Pharmacist

## 2010-10-05 DIAGNOSIS — Z7901 Long term (current) use of anticoagulants: Secondary | ICD-10-CM

## 2010-10-05 DIAGNOSIS — D6859 Other primary thrombophilia: Secondary | ICD-10-CM

## 2010-10-05 DIAGNOSIS — I2699 Other pulmonary embolism without acute cor pulmonale: Secondary | ICD-10-CM

## 2010-10-05 DIAGNOSIS — I2724 Chronic thromboembolic pulmonary hypertension: Secondary | ICD-10-CM | POA: Insufficient documentation

## 2010-10-05 NOTE — Patient Instructions (Signed)
Patient instructed to take medications as defined in the Anti-coagulation Track section of this encounter.  Patient instructed to  today's dose.  Patient verbalized understanding of these instructions.     

## 2010-10-05 NOTE — Progress Notes (Signed)
Anti-Coagulation Progress Note  [name] is a 40 y.o. female who is currently on an anti-coagulation regimen.    RECENT RESULTS: Recent results are below, the most recent result is correlated with a dose of 67.5 mg. per week: Lab Results  Component Value Date   INR 2.8 10/05/2010   INR 4.6 09/07/2010   INR 3.60* 09/03/2010    ANTI-COAG DOSE:   Latest dosing instructions   Total Sun Mon Tue Wed Thu Fri Sat   67.5 11.25 mg 7.5 mg 11.25 mg 7.5 mg 11.25 mg 7.5 mg 11.25 mg    (7.5 mg1.5) (7.5 mg1) (7.5 mg1.5) (7.5 mg1) (7.5 mg1.5) (7.5 mg1) (7.5 mg1.5)         ANTICOAG SUMMARY: Anticoagulation Episode Summary              Current INR goal 2.5-3.5 Next INR check 11/02/2010   INR from last check 2.8 (10/05/2010)     Weekly max dose (mg)  Target end date Indefinite   Indications Pulmonary embolism and infarction, Long term current use of anticoagulant, PULMONARY HYPERTENSION   INR check location [facility] Preferred lab    Send INR reminders to [facility]   Comments             ANTICOAG TODAY: Anticoagulation Summary as of 10/05/2010              INR goal 2.5-3.5     Selected INR 2.8 (10/05/2010) Next INR check 11/02/2010   Weekly max dose (mg)  Target end date Indefinite   Indications Pulmonary embolism and infarction, Long term current use of anticoagulant, PULMONARY HYPERTENSION    Anticoagulation Episode Summary              INR check location [facility] Preferred lab    Send INR reminders to [facility]   Comments             PATIENT INSTRUCTIONS: Patient Instructions  Patient instructed to take medications as defined in the Anti-coagulation Track section of this encounter.  Patient instructed to  today's dose.  Patient verbalized understanding of these instructions.        FOLLOW-UP Return in 4 weeks (on 11/02/2010) for Follow up INR.  Hulen Luster, III Pharm.D., CACP

## 2010-11-02 ENCOUNTER — Ambulatory Visit (INDEPENDENT_AMBULATORY_CARE_PROVIDER_SITE_OTHER): Payer: Commercial Managed Care - PPO | Admitting: Pharmacist

## 2010-11-02 DIAGNOSIS — D6859 Other primary thrombophilia: Secondary | ICD-10-CM

## 2010-11-02 DIAGNOSIS — Z7901 Long term (current) use of anticoagulants: Secondary | ICD-10-CM

## 2010-11-02 DIAGNOSIS — I2699 Other pulmonary embolism without acute cor pulmonale: Secondary | ICD-10-CM

## 2010-11-02 NOTE — Progress Notes (Signed)
Anti-Coagulation Progress Note  [name] is a 40 y.o. female who is currently on an anti-coagulation regimen.    RECENT RESULTS: Recent results are below, the most recent result is correlated with a dose of 67.5 mg. per week: Lab Results  Component Value Date   INR 3.6 11/02/2010   INR 2.8 10/05/2010   INR 4.6 09/07/2010    ANTI-COAG DOSE:   Latest dosing instructions   Total Sun Mon Tue Wed Thu Fri Sat   67.5 11.25 mg 7.5 mg 11.25 mg 7.5 mg 11.25 mg 7.5 mg 11.25 mg    (7.5 mg1.5) (7.5 mg1) (7.5 mg1.5) (7.5 mg1) (7.5 mg1.5) (7.5 mg1) (7.5 mg1.5)         ANTICOAG SUMMARY: Anticoagulation Episode Summary              Current INR goal 2.5-3.5 Next INR check 11/30/2010   INR from last check 3.6! (11/02/2010)     Weekly max dose (mg)  Target end date Indefinite   Indications Pulmonary embolism and infarction, Long term current use of anticoagulant, PULMONARY HYPERTENSION   INR check location [facility] Preferred lab    Send INR reminders to [facility]   Comments             ANTICOAG TODAY: Anticoagulation Summary as of 11/02/2010              INR goal 2.5-3.5     Selected INR 3.6! (11/02/2010) Next INR check 11/30/2010   Weekly max dose (mg)  Target end date Indefinite   Indications Pulmonary embolism and infarction, Long term current use of anticoagulant, PULMONARY HYPERTENSION    Anticoagulation Episode Summary              INR check location [facility] Preferred lab    Send INR reminders to [facility]   Comments             PATIENT INSTRUCTIONS: Patient Instructions  Patient instructed to take medications as defined in the Anti-coagulation Track section of this encounter.  Patient instructed to take today's dose. Patient instructed to OMIT ONE dose of warfarin day in advance of onset of menses to help with excessive menses blood loss.  Patient verbalized understanding of these instructions.        FOLLOW-UP Return in 4 weeks (on  11/30/2010) for Follow up INR.  Hulen Luster, III Pharm.D., CACP

## 2010-11-02 NOTE — Patient Instructions (Signed)
Patient instructed to take medications as defined in the Anti-coagulation Track section of this encounter.  Patient instructed to take today's dose. Patient instructed to OMIT ONE dose of warfarin day in advance of onset of menses to help with excessive menses blood loss.  Patient verbalized understanding of these instructions.

## 2010-11-10 LAB — POCT PREGNANCY, URINE: Preg Test, Ur: NEGATIVE

## 2010-11-11 LAB — CBC
HCT: 31.7 % — ABNORMAL LOW (ref 36.0–46.0)
Hemoglobin: 10.8 g/dL — ABNORMAL LOW (ref 12.0–15.0)
WBC: 6.8 10*3/uL (ref 4.0–10.5)

## 2010-11-12 LAB — WET PREP, GENITAL
Trich, Wet Prep: NONE SEEN
Yeast Wet Prep HPF POC: NONE SEEN

## 2010-11-12 LAB — URINE MICROSCOPIC-ADD ON

## 2010-11-12 LAB — URINALYSIS, ROUTINE W REFLEX MICROSCOPIC
Glucose, UA: NEGATIVE mg/dL
Ketones, ur: NEGATIVE mg/dL
Leukocytes, UA: NEGATIVE
Nitrite: NEGATIVE
Protein, ur: NEGATIVE mg/dL

## 2010-11-12 LAB — CBC
Hemoglobin: 13.1 g/dL (ref 12.0–15.0)
Hemoglobin: 14.1 g/dL (ref 12.0–15.0)
MCHC: 33.7 g/dL (ref 30.0–36.0)
MCV: 88.3 fL (ref 78.0–100.0)
RBC: 4.36 MIL/uL (ref 3.87–5.11)
RBC: 4.72 MIL/uL (ref 3.87–5.11)
RDW: 14.8 % (ref 11.5–15.5)

## 2010-11-12 LAB — PROTIME-INR
INR: 1.1 (ref 0.00–1.49)
Prothrombin Time: 14 seconds (ref 11.6–15.2)

## 2010-11-30 ENCOUNTER — Ambulatory Visit (INDEPENDENT_AMBULATORY_CARE_PROVIDER_SITE_OTHER): Payer: Commercial Managed Care - PPO | Admitting: Pharmacist

## 2010-11-30 DIAGNOSIS — Z7901 Long term (current) use of anticoagulants: Secondary | ICD-10-CM

## 2010-11-30 DIAGNOSIS — I2699 Other pulmonary embolism without acute cor pulmonale: Secondary | ICD-10-CM

## 2010-11-30 DIAGNOSIS — D6859 Other primary thrombophilia: Secondary | ICD-10-CM

## 2010-11-30 NOTE — Progress Notes (Addendum)
Anti-Coagulation Progress Note  [name] is a 40 y.o. female who is currently on an anti-coagulation regimen.    RECENT RESULTS: Recent results are below, the most recent result is correlated with a dose of 67.5 mg. per week: Lab Results  Component Value Date   INR 3.4 11/30/2010   INR 3.6 11/02/2010   INR 2.8 10/05/2010    ANTI-COAG DOSE:   Latest dosing instructions   Total Sun Mon Tue Wed Thu Fri Sat   67.5 11.25 mg 7.5 mg 11.25 mg 7.5 mg 11.25 mg 7.5 mg 11.25 mg    (7.5 mg1.5) (7.5 mg1) (7.5 mg1.5) (7.5 mg1) (7.5 mg1.5) (7.5 mg1) (7.5 mg1.5)         ANTICOAG SUMMARY: Anticoagulation Episode Summary              Current INR goal 2.5-3.5 Next INR check 01/11/2011   INR from last check 3.4 (11/30/2010)     Weekly max dose (mg)  Target end date Indefinite   Indications Pulmonary embolism and infarction, Long term current use of anticoagulant, PULMONARY HYPERTENSION   INR check location [facility] Preferred lab    Send INR reminders to ANTICOAG IMP   Comments             ANTICOAG TODAY: Anticoagulation Summary as of 11/30/2010              INR goal 2.5-3.5     Selected INR 3.4 (11/30/2010) Next INR check 01/11/2011   Weekly max dose (mg)  Target end date Indefinite   Indications Pulmonary embolism and infarction, Long term current use of anticoagulant, PULMONARY HYPERTENSION    Anticoagulation Episode Summary              INR check location [facility] Preferred lab    Send INR reminders to ANTICOAG IMP   Comments             PATIENT INSTRUCTIONS: Patient Instructions  Patient instructed to take medications as defined in the Anti-coagulation Track section of this encounter.  Patient instructed to take today's dose.  Patient verbalized understanding of these instructions.        FOLLOW-UP Return in 6 weeks (on 01/11/2011) for Follow up INR.  Hulen Luster, III Pharm.D., CACP   Ms. Starkey's history and INR were reviewed.  I agree with Dr.  Saralyn Pilar assessment and plan.

## 2010-11-30 NOTE — Patient Instructions (Signed)
Patient instructed to take medications as defined in the Anti-coagulation Track section of this encounter.  Patient instructed to take today's dose.  Patient verbalized understanding of these instructions.    

## 2010-12-15 NOTE — Group Therapy Note (Signed)
[name], Gina NO.:  [id number]   MEDICAL RECORD NO.:  [id number]          PATIENT TYPE:  WOC   LOCATION:  [facility]                   FACILITY:  [facility]   PHYSICIAN:  Argentina Donovan, MD        DATE OF BIRTH:  October 27, 1970   DATE OF SERVICE:  11/21/2008                                  CLINIC NOTE   The patient is a 40 year old [place] African female from [place] who has been  in the [place] for about 10 years and does speak some [place] with  [place], her other language.  She is gravida 4, para 3-0-1-3, her  youngest child age 5, who went into the maternity admissions office on  October 31, 2008 after having had a spontaneous miscarriage. She had  previously been seen on March 25 with a beta of 29,275 and no  intrauterine gestational sac.  They thought she had had a complete  abortion.  The pathology showed chorionic villi on some tissue that she  passed.  The patient comes in today and she is still having some  bleeding.  She said that the bleeding had stopped for about 2 days but  it is dark staining and she only has to change the pad a couple times a  day.   PHYSICAL EXAMINATION:  ABDOMEN:  Soft, flat, nontender.  No masses or  organomegaly.  EXTERNAL GENITALIA:  Normal.  BUS within normal limits.  Vagina is  clean, rugated with some small amount of blood, pinkish more than frank  blood in the vagina and coming from the cervix very slowly.  The uterus  is anterior, appears to be of normal size, shape, consistency.  The  adnexa could not be palpated because of the habitus of the patient, 5  feet 6 inches tall.  Weight 180 pounds.   I am not sure whether this is just the remains of bleeding from a  complete AB or she has an incomplete.  We are going to get a beta and  repeat the ultrasound and have her come back in a week for followup.   IMPRESSION:  Possible incomplete abortion.           ______________________________  Argentina Donovan, MD     PR/MEDQ  D:   11/21/2008  T:  11/21/2008  Job:  [id number]

## 2010-12-15 NOTE — Group Therapy Note (Signed)
NAMEMELONEY, [name] NO.:  [id number]   MEDICAL RECORD NO.:  [id number]          PATIENT TYPE:  WOC   LOCATION:  [facility]                   FACILITY:  [facility]   PHYSICIAN:  Caren Griffins, CNM       DATE OF BIRTH:  Apr 11, 1971   DATE OF SERVICE:  12/19/2008                                  CLINIC NOTE   REASON FOR VISIT:  Results.   HISTORY:  This is a 40 year old [place] African female who is G4, P 3-0-1-3  who had a SAB at approximately 6 weeks, about 7 weeks ago.  She was seen  here November 21, 2008 by Dr. Okey Dupre, at which time she was still having some  bleeding and he therefore ordered a quant, which was 5, and another  pelvic ultrasound, which was normal, consistent with a completed SAB.  The patient is desirous of contraception and has discussed this with her  physician at [facility] where she has had open chest surgeries and had a  pulmonary embolus in 2005.  She is followed at [facility] for blood work  because she is on Coumadin 7.5 mg daily.  She states that her levels are  in target range.  She understands her options for contraception and  desires a Mirena IUD.  She has the brochures and information which was  given to her by her primary doctor.   PROCEDURE NOTE:  After informed consent was obtained and the patient  expressed understanding of the risks, benefits and alternatives to this  Mirena IUD,  she desired to proceed with insertion at today's visit.  She was therefore placed in dorsal lithotomy position.  The cervix was  brought into view after bimanual was done, revealing the uterus to be in  mid to anterior position.  Under sterile technique the cervix was  brought into view and the multiparous os cleansed with Betadine swabs,  grasped the anterior lip with a single tooth tenaculum.  The uterus  sounded to 8.5 cm.  A Mirena IUD was placed without difficulty.  The  strings were cut at 2.5 cm.  The tenaculum was removed and there was a  small amount of  bleeding at the tenaculum site and from the cervix, but  this was felt to be within normal limits.  She tolerated the procedure  well.   PLAN:  She will be given an appointment to return in 4 weeks for a  string check and meanwhile, if she has cramping, to use ibuprofen 800 mg  q.8 h. alternating with Tylenol 650 mg q.4-6 h.           ______________________________  Caren Griffins, CNM     DP/MEDQ  D:  12/19/2008  T:  12/19/2008  Job:  [id number]

## 2010-12-18 NOTE — Assessment & Plan Note (Signed)
[facility]                               PULMONARY OFFICE NOTE   CHARMEL, PRONOVOST                     MRN:          [id number]  DATE:02/28/2006                            DOB:          [birth year]    REASON FOR VISIT:  Ms. [name] is a 40 year old African female who follows  for pulmonary hypertension, presumed secondary to pulmonary emboli, though  difficult to tell if there is a primary component.  The patient has had  extensive workup.  She did undergo right heart catheterization on Dec 20, 2005 by Dr. Nicholes Mango which showed that the patient had a pulmonary  capillary wedge pressure of 8 to 12 with cardiac index of 1.3 L per minute  and a cardiac output of 2.4 L per minute.   The patient subsequently after that catheterization did have a pulmonary  embolus despite the fact that she has been on Coumadin.  She was then  admitted to the teaching service at [facility] who is the patient's primary  care physician, and she required vena caval filter placement.  She is  currently on Revatio.   CURRENT MEDICATIONS:  As noted on the intake sheet.  This include Revatio,  digoxin, Zyrtec p.r.n., Warfarin as directed, Advair 100/50 one inhalation  twice a day, and oxygen at 2 L per minute at rest and 3 L with ambulation.  The patient, in addition, takes furosemide p.r.n.  She does weigh herself  before using this medication.  She is also on oxycodone p.r.n.   PHYSICAL EXAMINATION:  VITAL SIGNS:  As noted.  Oxygen saturation is 96% on  2 L nasal cannula of O2.  GENERAL:  This is a well-developed, mildly obese African female who is in no  acute distress.  HEENT:  Unremarkable.  NECK:  Supple.  No adenopathy noted.  No JVD.  LUNGS:  Clear to auscultation bilaterally.  CARDIAC:  Regular rate and rhythm.  The patient has an accentuated P2.  EXTREMITIES:  The patient has trace edema.   IMPRESSION:  1.  Pulmonary hypertension.  The patient has been  difficult to manage due to      language barrier and poor understanding of her disease process.  2.  History of pulmonary emboli.  3.  Cor pulmonale secondary to the above.   PLAN:  1.  Plan will be to continue medications as they are.  2.  We are to start her on __________ for pulmonary hypertension.  3.  Continue Revatio.  4.  She will be referred to Dr. Renne Crigler clinic at [facility] for further      evaluation of the pulmonary hypertension.  The patient may very well at      this point require transplant.  5.  Follow up will be in four to six weeks' time.  She is to contact us      prior to that time should any problems arise.  Gailen Shelter, MD   CLG/MedQ  DD:  02/28/2006  DT:  03/01/2006  Job #:  [id number]   cc:   Dennis Bast, MD

## 2010-12-18 NOTE — Assessment & Plan Note (Signed)
[facility]                               PULMONARY OFFICE NOTE   HARRIS, KISTLER                     MRN:          [id number]  DATE:05/09/2006                            DOB:          [birth year]    Ms. Mollenkopf is a 40 year old African female, who follows here for pulmonary  hypertension secondary to chronic pulmonary emboli.  The patient apparently  has been recently discharged from [facility], where she  underwent a thrombectomy for the same.  This was on April 15, 2006.  Since then the patient states that she is breathing much better.  She denies  any dyspnea.  Her oxygen saturations on room air have been satisfactory.  She was discharged without the need of oxygen.  Her only complaint is that  is of debility and continued difficulties with pain and discomfort from the  surgical incision.   The patient denies any fevers, chills or sweats.  She states that she has  had a flu vaccine.   CURRENT MEDICATIONS:  As noted on the intake sheet, and include warfarin,  furosemide, spironolactone and metoprolol.  She also takes oxycodone and  Colace p.r.n.   PHYSICAL EXAMINATION:  VITAL SIGNS:  Are as noted.  Oxygen saturation  initially was recorded as 90% on room air, this was rechecked and it was  98%.  GENERAL:  This is a well-developed, mildly overweight African female who is  in no acute distress.  She is totally ambulatory.  She is not wearing any  oxygen.  HEENT:  Examination is unremarkable.  NECK:  Supple, no adenopathy noted, no JVD.  LUNGS:  Remarkably clear.  CARDIAC:  Regular rate and rhythm, no accentuated P2, no murmurs heard, no  gallops.  ABDOMEN:  Slightly obese, otherwise unremarkable.  EXTREMITIES:  No edema noted.   We did perform ambulatory oximetry today.  The patient's heart rate at rest  was 75 beats per minute, this at peak study was 92.  Her oxygen saturation  was 99%, she did not desaturate  further than 95% and appeared very  comfortable.  She completed the test without difficulty.   IMPRESSION:  1. Pulmonary hypertension due to chronic pulmonary emboli.  The patient is      status post successful thrombectomy.  In this regard, she is      asymptomatic.  2. Chronic hypoxic respiratory failure secondary to the same, currently no      need for oxygen is apparent.   PLAN:  1. The patient will continue to follow up with the pulmonary hypertension      clinic at [facility] as posted.  She is to continue on lifelong      Coumadin.  She does have IVC filter in place.  2. Followup will be in two months' time.  She is contact us prior to that      time should any problems arise.       Gailen Shelter, MD      CLG/MedQ  DD:  05/09/2006  DT:  05/11/2006  Job #:  [id number]  cc:   Martie Round, MD

## 2010-12-18 NOTE — Discharge Summary (Signed)
[name], [name]            ACCOUNT NO.:  [id number]   MEDICAL RECORD NO.:  [id number]          PATIENT TYPE:  INP   LOCATION:  3743                         FACILITY:  [facility]   PHYSICIAN:  Eliseo Gum, M.D.   DATE OF BIRTH:  1970/09/09   DATE OF ADMISSION:  04/27/2005  DATE OF DISCHARGE:  05/01/2005                                 DISCHARGE SUMMARY   DISCHARGE DIAGNOSES:  1.  Pulmonary embolism.  2.  Pulmonary hypertension with right-side heart failure.  3.  Headache with diplopia.  4.  Orthostatic hypotension.   DISCHARGE MEDICATIONS:  1.  Coumadin 10 mg p.o. daily.  2.  Lovenox 75 mg subcu b.i.d.  3.  Digoxin 0.125 mg p.o. daily.  4.  Revatio 20 mg p.o. t.i.d.  5.  Albuterol MDI two puffs q.4h. p.r.n.   CONSULTANTS:  Danice Goltz, M.D., from [facility].   PROCEDURES:  1.  A CT angiogram on September 26 that showed a dilated main  pulmonary      arteries consistent with pulmonary hypertension.  Left lower lobe      pulmonary artery showed narrowing and mural thickening consistent with      recanalized clot.  Several small, ill-defined filling defects in      segmental branches of both lower lobes consistent with recurrent      pulmonary emboli, possibly subacute.  Mosaic perfusion to lungs with      patchy ground-glass opacities consistent with chronic PE.  2.  A CT head, noncontrasted, on September 26, consistent with no acute      intracranial findings.  3.  A 2-D echo on September 26.  Overall left ventricular systolic function      was normal.  Left ventricular ejection fraction was between 55 and 65%.      No left ventricular regional wall motion abnormalities.  Moderate      flattening of the interventricular septum during systole and diastole.      Right ventricle is moderately dilated.  Right ventricular systolic      function was reduced, and right atrium was moderately dilated.  4.  Carotid Dopplers on September 26, which were normal.  5.   Arterial Dopplers of both lower legs on September 28 which were also      normal, however showed a low probability of tibial disease.  6.  A CT angiogram of the chest on September 28:  Stable recanalized      bilateral pulmonary arteries with mural thrombus and no evidence of      interval acute PE.   DISPOSITION AND FOLLOW-UP:  The patient is to follow up with J. Gross in the  Coumadin clinic on May 06, 2005, at 2:15 p.m., at which time the PT and  INR will be monitored.  The patient's PT goal is between 2.5 and 3.  On date  of discharge patient's INR is 2.3, so it was decided to bridge her with  Lovenox Gross again.  The patient's Coumadin dose is increased to 10 mg, and  she will be followed by Dr. Michaell Cowing regarding her daily INRs.  The patient  will also follow up with her continuity physician, Dennis Bast, M.D., in the  [facility] outpatient clinic on May 26, 2005, at 2 p.m., and he will  also follow with her pulmonologist, Dr. Danice Goltz, on May 25, 2005,  at 9:50 a.m.   BRIEF ADMITTING HISTORY AND PHYSICAL EXAMINATION:  For full details, please  refer to the formal H&P included in the patient's chart.  The patient is a  40 year old [place] African female, French-speaking, who has been in the [place].  She had a history of a massive PE secondary to oral  contraceptive pills in 2004.  She presented with a one-day history of  shortness of breath, left-sided chest pain, that increased with inspiration.  Also complaining of a headache with blurred vision on the morning of  admission with no nausea or emesis.  Positive shortness of breath with  minimal exertion.  The patient states that she feels as she did during her  previous admission in 2005 for a pulmonary embolism.  The patient states  that she takes her medications regularly.  She stopped her oral  contraceptive pills last year.  She is also complaining of a right edematous  lower extremity with pain upon  palpation.   ALLERGIES:  ASPIRIN, to which she has some stomach irritation.   PAST MEDICAL HISTORY:  1.  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  2.  Pulmonary hypertension.  3.  Sickle cell trait, hemoglobin AS.  4.  Pulmonary embolus one year ago.   HOME MEDICATIONS:  1.  Coumadin 7.5 mg p.o. daily.  2.  Digoxin 0.125 mg p.o. daily.  3.  Revatio 20 mg t.i.d.  4.  Albuterol MDI two puffs q.4h. p.r.n.   SOCIAL HISTORY:  Negative per patient for tobacco, alcohol or illicit drug  use.  She is married.  She is a housewife.  She lives with her husband and  three children.   FAMILY HISTORY:  Significant for hypertension in both parents.  Siblings and  children are healthy.   REVIEW OF SYSTEMS:  Positive for fatigue, chest pain, dyspnea and dyspnea on  exertion.  Negative for everything else.   PHYSICAL EXAMINATION:  VITAL SIGNS:  Upon admission, temperature 97, blood  pressure 134/93, pulse rate 86, respiratory rate 14, O2 saturations 99% on 2  L.  GENERAL:  The patient was lying in bed.  She was in mild acute respiratory  distress, out of breath during the initial conversation.  HEENT:  Eyes:  Pupils equally reactive to light and accommodation.  Extraocular movements are intact.  Anicteric sclerae and moist conjunctivae.  ENT:  Clear oropharynx with no hyperemia, no thrush.  NECK:  Supple with no JVD, no thyromegaly or carotid bruits.  RESPIRATORY:  She is clear to auscultation bilaterally with good air entry,  with no rhonchi, rales or wheezing.  CARDIOVASCULAR:  Regular rate and rhythm, S1 plus S2.  No murmurs, rubs or  gallops.  Pain is reproduced by chest palpation and with deep inspiration.  GASTROINTESTINAL:  Soft, nontender, nondistended, positive bowel sounds,  tympanic but no guarding or rebound.  Negative Murphy's sign.  EXTREMITIES:  She had a warm right calf with edema on the right greater than the left,  with a positive Homans sign on the right calf, with pain on  palpation of the right calf.  Adequate capillary refill.  SKIN:  Warm and moist.  LYMPHATIC:  She had no peripheral lymphadenopathy.  NEUROLOGIC:  Grossly intact and nonfocal.  PSYCHIATRIC:  She was tearful upon admission.   LABORATORY DATA UPON ADMISSION:  WBC 6.7, hemoglobin 16.1, hematocrit 47.4  with an MCV of 85.2, platelet count of 254.  ANC was 3.6.  Sodium 136,  potassium 3.3, chloride 110, CO2 20, BUN 8, creatinine 0.9, glucose 87.  PTT  was 31, PT was 20.5, INR was 1.7.  EKG was consistent with right atrial  enlargement and right axis deviation.   HOSPITAL COURSE BY ACTIVE PROBLEM:  Problem 1.  PULMONARY EMBOLISM:  CT angiogram of the chest upon admission  revealed multiple small filling defects consistent with a new PE as well as  an old PE that has since recanalized.  The patient was found to be  subtherapeutic on Coumadin with an INR of 1.7 upon admission.  She was  initially placed on a heparin drip to bridge the Coumadin.  The Coumadin was  increased to 10 mg.  The patient was later taken off the heparin drip and  low molecular weight heparin at full dose was initiated.  The patient will  follow up with Dr. Michaell Cowing in the Coumadin clinic to monitor her INRs.   Problem 2.  ORTHOSTATIC HYPOTENSION:  The patient was found to be mildly  orthostatic, which could have been a cause of her dizziness, blurred vision  and headache upon admission.  The patient was treated with IV fluids at 150  mL/hr. and on day of discharge patient is nonorthostatic.   Problem 3.  PULMONARY HYPERTENSION WITH RIGHT-SIDED HEART FAILURE:  Most  likely secondary to her massive PE in 2005.  Currently on Revatio 20 mg  t.i.d.  The patient will follow up with her pulmonary doctor, Dr. Danice Goltz, at which time they will decide which is a proper medication for  her pulmonary hypertension.   DISCHARGE VITAL SIGNS:  Temperature 98.3, blood  pressure 112/81, heart rate  87, respiratory rate 16, O2 saturations 96% on room air.   LABS UPON DISCHARGE:  White count 6.3, hemoglobin 13.8, hematocrit 40.4,  with an MCV of 85.7, platelet count of 247.  Sodium 138, potassium 3.9,  chloride 112, CO2 22, BUN 10, creatinine 1.0, glucose 116, calcium 8.6.  PT 25.4, INR 2.3.  BNP of 139.1.  Lupus anticoagulant was negative.  Mutation for factor V Leiden was negative as well.  Her protein, C, S, and  antithrombin-3 were also found to be low; however, it is hard to evaluate  the accuracy of these results when the patient is on anticoagulant therapy.      Peggye Pitt, M.D.      Eliseo Gum, M.D.  Electronically Signed    EH/MEDQ  D:  05/01/2005  T:  05/01/2005  Job:  [id number]   cc:   Dennis Bast, MD  Fax: [phone]   J. Michaell Cowing, M.D.  Coumadin Clinic  [facility]   Danice Goltz, M.D. [facility]  [address] [place], [place] [address]

## 2010-12-18 NOTE — Discharge Summary (Signed)
[name], [name]            ACCOUNT NO.:  [id number]   MEDICAL RECORD NO.:  [id number]          PATIENT TYPE:  INP   LOCATION:  2023                         FACILITY:  [facility]   PHYSICIAN:  Ronda Fairly, M.D.    DATE OF BIRTH:  1971-06-13   DATE OF ADMISSION:  03/28/2006  DATE OF DISCHARGE:  03/31/2006                                 DISCHARGE SUMMARY   DISCHARGE DIAGNOSES:  1. Chest pain secondary to pulmonary hypertension.  2. Pulmonary hypertension with a cor pulmonale.  3. History of pulmonary embolism secondary to pulmonary hypertension      status post inferior vena cava filter placement.   DISCHARGE MEDICATIONS:  1. Coumadin 5 mg p.o. once daily.  2. Lovenox 80 mg subcutaneous injection twice daily x 3 days.  3. Iron sulfate 325 mg p.o. three times daily.  4. Furosemide 40 mg p.o. daily p.r.n. weight greater than 170 pounds.  5. Digoxin 0.125 mg p.o. once daily.  6. Trazodone 50 mg p.o. once q.h.s.  7. Revatio 20 mg p.o. three times daily.  8. Nexium 40 mg p.o. once daily.  9. Zyrtec 10 mg p.o. p.r.n.  10.Advair 100/50 one puff twice daily.  11.Oxycodone/APAP 5/325 mg p.o. every 6 hours p.r.n. pain.  12.Oxygen 2 liters per minute as needed.   FOLLOWUP AND DISPOSITION:  1. The patient will return to [facility] main laboratory on      Saturday, September 1st for PT and INR check.  The resident on call      will be paged and will make a decision as to whether patient will      continue Lovenox or make adjustments to the Coumadin therapy at that      time.  2. The patient will return to Dr. Danice Goltz with [facility]      on September 17th at 9 o'clock a.m.  3. The patient will return to Dr. Alexandria Lodge on September 10th at 2 p.m.  4. The patient will follow up with Dr. Silvestre Mesi at [facility] on September 13th at 4 p.m.  At that time, the      patient will need follow up on hemoglobin, hematocrit and  electrolyte      levels.   PROCEDURES AND DIAGNOSTIC STUDIES:  1. Chest x-ray on March 28, 2006 revealed prominent pulmonary arteries      consistent with pulmonary hypertension.  No effusion or pneumothorax.      Right upper lobe and basilar air space opacities were seen.  2. CT angiography of chest performed on March 28, 2006 revealed increased      multilobular ground glass appearance that has been documented on      previous CT scans.  No acute pulmonary embolism or aortic dissection      was seen.  3. Chest x-ray on March 29, 2006 revealed stable, mild cardiomegaly with      pulmonary artery enlargement consistent with pulmonary hypertension,      unchanged from previous studies.  4. Electrocardiogram on March 28, 2006 revealed a normal sinus  rhythm,      right axis deviation, right atrial and left atrial enlargement, right      ventricular hypertrophy with ST changes in V1 to V4 consistent with      strain pattern.   CONSULTANTS:  None.   BRIEF HOSPITAL COURSE:  The patient is a 40 year old female with history of  recurrent pulmonary embolism secondary to pulmonary hypertension with cor  pulmonale and chronic chest pain at home.  She has recently been evaluated  by Dr. Carolee Rota at [facility] for possibility of heart and  lung transplant.  She is also being followed by Dr. Danice Goltz of  [facility].  The patient presented to the emergency room on March 28, 2006 with increased chest pain radiating to the back with dyspnea on  exertion and cough.  BNP levels were elevated at 334.  The patient was  afebrile with a normal white cell count.  No infiltrates on chest x-ray.  No  hemoptysis.  Cardiac enzymes were negative, chest CT scan was done and  pulmonary embolism and aortic dissection was ruled out.  Electrocardiogram  was unchanged from previous hospital admissions.  A D-dimer was negative.  The patient's pain was treated with oxycodone as needed  over the hospital  course.  INR was also found to be elevated on admission at 3.5, so Coumadin  was held on March 29, 2006.  INR was 2.5 on March 30, 2006, so Coumadin  was resumed at 2.5 mg p.o. daily.  On March 31, 2006, the INR was 1.5, so  Coumadin was increased to 5 mg p.o. daily, and patient on discharge, was  sent home with Lovenox shots and will return to clinic on Saturday,  September 1st to have INR rechecked.  The patient also complained of low  back pain on admission and a urinalysis was done which was negative for  nitrates and leukocyte esterase.  The patient's low back pain, dyspnea at  rest and cough resolved over the course of the hospital stay.  Chest pain  was decreased to baseline levels, and the patient was discharged on March 31, 2006 in good condition.   ALLERGIES:  ASPIRIN.   PAST MEDICAL HISTORY:  1. Recurrent pulmonary embolisms status post inferior vena cava filter      placement.  2. Sickle cell trait.  3. Pulmonary hypertension with cor pulmonale.   PHYSICAL EXAM ON ADMISSION:  VITAL SIGNS:  Temperature 97.7, pulse 70s-80s,  blood pressure 104-122/70s-80s, O2 saturation 95% on 2 liters.  HEENT:  Pupils equal, round and reactive to light, extraocular movements  intact.  CARDIOVASCULAR:  Regular rhythm and rate, loud S2, no murmurs, gallops or  rubs.  PULMONARY:  Lungs were clear to auscultation bilaterally, no wheezes.  ABDOMEN:  Soft, nontender abdomen.  No masses.  No hepatosplenomegaly.  EXTREMITIES:  Warm to the touch, pulses 2+ bilaterally, trace lower  extremity edema.   ADMISSION LABS:  White blood cell count 6.0, hemoglobin 12.1, hematocrit  35.9, platelets 238, INR 3.7, sodium 141, potassium 3.9, chloride 113,  bicarbonate 25, BUN 8, creatinine 1.1, glucose 109, BNP 334.   PROBLEM LIST:  1. Chest pain.  The patient has chronic chest pain at home and only comes     to the hospital with exacerbations of her normal chest pain.  On this       admission, patient complained of chest pain, 7/10 in intensity, which      is greater than she  normally experiences at home.  Acute coronary      syndrome, pulmonary embolism, aortic dissection, pneumothorax were all      ruled out.  The patient was treated with oxycodone and over the course      of hospital stay, her pain resolved back to the baseline level on the      day of discharge.  This chest discomfort is likely secondary to the      patient's pulmonary hypertension and has been chronic in nature for her      for sometime.  2. Low back pain.  The patient complained of low back pain on admission,      and the urinalysis was negative for urinary tract infection.  The      patient was afebrile.  White cell counts were in the normal range.  The      patient did not complain of any dysuria.  The patient was given      oxycodone for pain and was given Flexeril p.r.n. for muscle spasms as      the pain was reproducible on palpation.  No Flexeril was needed as pain      resolved on its own over the course of 1 day.  3. Pulmonary hypertension with cor pulmonale.  The patient has had      longstanding pulmonary hypertension.  Chest x-rays on this admission      showed cardiomegaly with pulmonary artery enlargement that was stable      from previous admissions.  The patient is treated with sildenafil and      digitalis, and these were continued at her normal doses over the      hospital course.  The patient did have some crackles on pulmonary exam      with the increasing ground glass appearance in her lung fields on CT      scan.  This was felt to be due to pulmonary edema, and the patient's      dose was increased from 20 mg b.i.d. to 40 mg p.o. once daily if weight      is greater than 170 pounds.  4. Transaminitis.  The patient was found to have elevated liver enzymes      with AST 66 and ALT 62 during the hospital admission.  An acute      hepatitis panel was negative for hepatitis A, B  and C.  It was felt      that the increased liver enzymes was most likely due to hepatic      congestion and liver enzymes were increased on discharge with AST 42      and ALT 47 on August 30th, when the patient was discharged.  5. Anticoagulation.  The patient was found to have an elevated INR of 3.7      on admission.  Coumadin was held on August 28.  INR was 2.5 the      following day on August 29, so the patient was restarted at a dose of      2.5 mg p.o.  On the following day, on March 31, 2006, INR was 1.5 and      so, the patient's Coumadin was increased to her normal dose of 5 mg      p.o. once daily, and the patient was discharged on Lovenox 80 mg subcu      twice daily, and the patient was scheduled to have her INR checked at  the [facility] main hospital laboratory on September the 1st, at which      time the resident will be paged to make the decision of whether to     continue the Lovenox or make any changes in her Coumadin regimen.   DISCHARGE VITALS AND LABORATORIES:  At the time of discharge, the patient's  temperature was 97.0.  Blood pressure was 111/74, pulse 76, O2 saturation  96% on 2 liters.  White blood cell count was 5.6, hemoglobin 11.7,  hematocrit 34.7, platelets 237, sodium 141, potassium 3.9, chloride 109,  bicarbonate 26, BUN 14, creatinine 1.1, glucose 108, INR was 1.5, alkaline  phosphatase was 71, AST 42, ALT 47, total bilirubin 1.0.      Ronda Fairly, M.D.  Electronically Signed     YB/MEDQ  D:  04/01/2006  T:  04/02/2006  Job:  [id number]

## 2010-12-18 NOTE — Cardiovascular Report (Signed)
[name], [name] NO.:  [id number]   MEDICAL RECORD NO.:  [id number]          PATIENT TYPE:  OIB   LOCATION:  1965                         FACILITY:  [facility]   PHYSICIAN:  Arvilla Meres, M.D. LHCDATE OF BIRTH:  [birth year]   DATE OF PROCEDURE:  12/20/2005  DATE OF DISCHARGE:                              CARDIAC CATHETERIZATION   PULMONOLOGIST:  Danice Goltz, M.D.   PATIENT IDENTIFICATION:  [name] is a delightful 40 year old woman with a  history of massive pulmonary embolus back in 2005.  Since that time, she has  had a significant pulmonary hypertension and has been treated with home  oxygen and Revatio by Dr. Jayme [name].  She had a followup chest CT which  showed a resolution of her pulmonary emboli; however, her pressures have  remained high on echocardiogram.  She is referred for right heart  catheterization and possible vasodilator challenge.  This was done in the  outpatient catheterization lab.   PROCEDURES PERFORMED:  Right heart catheterization with an with Fick cardiac  output.   DESCRIPTION OF PROCEDURE:  The risks and benefits of catheterization were  explained.  Consent was signed and placed on the chart.  The right groin  area was prepped and draped in routine sterile fashion.  Multiple attempts  were made to cannulate the right femoral vein including with a Smart needle.  However, these were unsuccessful.  The left coronary artery was then prepped  and draped in routine sterile fashion, and the left femoral vein was  cannulated, and a 7-French venous sheath was placed.  Standard right heart  catheterization was preformed using a Swan-Ganz catheter.  Vasodilatory  challenge was not performed due to the patient's low cardiac output.   FINDINGS:  Right atrial pressure mean of 8, RV pressure 103/1, PA pressure  99/29 with a mean of 51.  Pulmonary capillary wedge pressure mean of 8 to  12.  Pulmonary artery saturation is 53% and 51% on 2  liters nasal cannula.  Femoral artery saturation 94%.  Fick cardiac output was 2.4 liters per  minute.  Cardiac index 1.3 liters per minute per meter squared.  Thermodilution cardiac output was 2.4 liters per minute, and cardiac index  was 1.3 liters per minute per meter squared.  Pulmonary vascular resistance  was 17.1 Woods units.   ASSESSMENT:  1.  Severe pulmonary arterial hypertension with evidence of cor pulmonale.  2.  Vasodilatory challenge not performed secondary to markedly depressed      cardiac output.   PLAN/DISCUSSION:  I will discuss the case with Dr. Jayme [name].  May consider  referral for [facility] for possible evaluation for heart/lung transplantation.      Arvilla Meres, M.D. [facility]  Electronically Signed     DB/MEDQ  D:  12/20/2005  T:  12/20/2005  Job:  [id number]

## 2011-01-11 ENCOUNTER — Ambulatory Visit (INDEPENDENT_AMBULATORY_CARE_PROVIDER_SITE_OTHER): Payer: 59 | Admitting: Pharmacist

## 2011-01-11 DIAGNOSIS — Z7901 Long term (current) use of anticoagulants: Secondary | ICD-10-CM

## 2011-01-11 DIAGNOSIS — I2699 Other pulmonary embolism without acute cor pulmonale: Secondary | ICD-10-CM

## 2011-01-11 DIAGNOSIS — D6859 Other primary thrombophilia: Secondary | ICD-10-CM

## 2011-01-11 NOTE — Progress Notes (Signed)
Anti-Coagulation Progress Note  [name] is a 40 y.o. female who is currently on an anti-coagulation regimen.    RECENT RESULTS: Recent results are below, the most recent result is correlated with a dose of 67.5 mg. per week: Lab Results  Component Value Date   INR 2.60 01/11/2011   INR 3.4 11/30/2010   INR 3.6 11/02/2010    ANTI-COAG DOSE:   Latest dosing instructions   Total Sun Mon Tue Wed Thu Fri Sat   71.25 11.25 mg 11.25 mg 11.25 mg 7.5 mg 11.25 mg 7.5 mg 11.25 mg    (7.5 mg1.5) (7.5 mg1.5) (7.5 mg1.5) (7.5 mg1) (7.5 mg1.5) (7.5 mg1) (7.5 mg1.5)         ANTICOAG SUMMARY: Anticoagulation Episode Summary              Current INR goal 2.5-3.5 Next INR check 02/08/2011   INR from last check 2.60 (01/11/2011)     Weekly max dose (mg)  Target end date Indefinite   Indications Pulmonary embolism and infarction, Long term current use of anticoagulant, PULMONARY HYPERTENSION   IN[facility] check location [facility]    Send INR reminders to ANTICOAG IMP   Comments             ANTICOAG TODAY: Anticoagulation Summary as of 01/11/2011              INR goal 2.5-3.5     Selected INR 2.60 (01/11/2011) Next INR check 02/08/2011   Weekly max dose (mg)  Target end date Indefinite   Indications Pulmonary embolism and infarction, Long term current use of anticoagulant, PULMONARY HYPERTENSION    Anticoagulation Episode Summary              INR check location [facility] Preferred lab    Send INR reminders to ANTICOAG IMP   Comments             PATIENT INSTRUCTIONS: Patient Instructions   Anti-Coagulation Progress Note  [name] is a 40 y.o. female who is currently on an anti-coagulation regimen.    RECENT RESULTS: Recent results are below, the most recent result is correlated with a dose of 67.5 mg. per week: Lab Results  Component Value Date   INR 2.60 01/11/2011   INR 3.4 11/30/2010   INR 3.6 11/02/2010    ANTI-COAG DOSE:   Latest  dosing instructions   Total Sun Mon Tue Wed Thu Fri Sat   71.25 11.25 mg 11.25 mg 11.25 mg 7.5 mg 11.25 mg 7.5 mg 11.25 mg    (7.5 mg1.5) (7.5 mg1.5) (7.5 mg1.5) (7.5 mg1) (7.5 mg1.5) (7.5 mg1) (7.5 mg1.5)         ANTICOAG SUMMARY: @ANTICOAGSUMMARY @  ANTICOAG TODAY: @ANTICOAGTODAY @  PATIENT INSTRUCTIONS:    FOLLOW-UP  Hulen Luster, III Pharm.D., CACP       FOLLOW-UP Return in 4 weeks (on 02/08/2011) for Follow up INR.  Hulen Luster, III Pharm.D., CACP

## 2011-01-11 NOTE — Patient Instructions (Addendum)
Anti-Coagulation Progress Note  [name] is a 40 y.o. female who is currently on an anti-coagulation regimen.    RECENT RESULTS: Recent results are below, the most recent result is correlated with a dose of 67.5 mg. per week: Lab Results  Component Value Date   INR 2.60 01/11/2011   INR 3.4 11/30/2010   INR 3.6 11/02/2010    ANTI-COAG DOSE:   Latest dosing instructions   Total Sun Mon Tue Wed Thu Fri Sat   71.25 11.25 mg 11.25 mg 11.25 mg 7.5 mg 11.25 mg 7.5 mg 11.25 mg    (7.5 mg1.5) (7.5 mg1.5) (7.5 mg1.5) (7.5 mg1) (7.5 mg1.5) (7.5 mg1) (7.5 mg1.5)         ANTICOAG SUMMARY: @ANTICOAGSUMMARY @  ANTICOAG TODAY: @ANTICOAGTODAY @  PATIENT INSTRUCTIONS:    FOLLOW-UP  Hulen Luster, III Pharm.D., CACP

## 2011-02-08 ENCOUNTER — Ambulatory Visit (INDEPENDENT_AMBULATORY_CARE_PROVIDER_SITE_OTHER): Payer: 59 | Admitting: Pharmacist

## 2011-02-08 DIAGNOSIS — Z7901 Long term (current) use of anticoagulants: Secondary | ICD-10-CM

## 2011-02-08 DIAGNOSIS — I2699 Other pulmonary embolism without acute cor pulmonale: Secondary | ICD-10-CM

## 2011-02-08 DIAGNOSIS — D6859 Other primary thrombophilia: Secondary | ICD-10-CM

## 2011-02-08 LAB — POCT INR: INR: 3

## 2011-02-08 NOTE — Patient Instructions (Signed)
Patient instructed to take medications as defined in the Anti-coagulation Track section of this encounter.  Patient instructed to take today's dose.  Patient verbalized understanding of these instructions.    

## 2011-02-08 NOTE — Progress Notes (Signed)
Anti-Coagulation Progress Note  [name] is a 40 y.o. female who is currently on an anti-coagulation regimen.    RECENT RESULTS: Recent results are below, the most recent result is correlated with a dose of 71.25 mg. per week: Lab Results  Component Value Date   INR 3.00 02/08/2011   INR 2.60 01/11/2011   INR 3.4 11/30/2010    ANTI-COAG DOSE:   Latest dosing instructions   Total Sun Mon Tue Wed Thu Fri Sat   71.25 11.25 mg 11.25 mg 11.25 mg 7.5 mg 11.25 mg 7.5 mg 11.25 mg    (7.5 mg1.5) (7.5 mg1.5) (7.5 mg1.5) (7.5 mg1) (7.5 mg1.5) (7.5 mg1) (7.5 mg1.5)         ANTICOAG SUMMARY: Anticoagulation Episode Summary              Current INR goal 2.5-3.5 Next INR check 03/15/2011   INR from last check 3.00 (02/08/2011)     Weekly max dose (mg)  Target end date Indefinite   Indications Pulmonary embolism and infarction, Long term current use of anticoagulant, PULMONARY HYPERTENSION   IN[facility] check location [facility] Preferred lab    Send INR reminders to ANTICOAG [facility]   Comments             ANTICOAG TODAY: Anticoagulation Summary as of 02/08/2011              INR goal 2.5-3.5     Selected INR 3.00 (02/08/2011) Next INR check 03/15/2011   Weekly max dose (mg)  Target end date Indefinite   Indications Pulmonary embolism and infarction, Long term current use of anticoagulant, PULMONARY HYPERTENSION    Anticoagulation Episode Summary              INR check location [facility] Preferred lab    Send INR reminders to ANTICOAG IMP   Comments             PATIENT INSTRUCTIONS: Patient Instructions  Patient instructed to take medications as defined in the Anti-coagulation Track section of this encounter.  Patient instructed to take today's dose.  Patient verbalized understanding of these instructions.        FOLLOW-UP Return in 5 weeks (on 03/15/2011) for Follow up INR.  Hulen Luster, III Pharm.D., CACP

## 2011-03-01 ENCOUNTER — Ambulatory Visit: Payer: 59

## 2011-03-11 ENCOUNTER — Other Ambulatory Visit: Payer: Self-pay | Admitting: Internal Medicine

## 2011-03-11 DIAGNOSIS — Z1231 Encounter for screening mammogram for malignant neoplasm of breast: Secondary | ICD-10-CM

## 2011-03-15 ENCOUNTER — Ambulatory Visit (INDEPENDENT_AMBULATORY_CARE_PROVIDER_SITE_OTHER): Payer: 59 | Admitting: Pharmacist

## 2011-03-15 DIAGNOSIS — D6859 Other primary thrombophilia: Secondary | ICD-10-CM

## 2011-03-15 DIAGNOSIS — I2699 Other pulmonary embolism without acute cor pulmonale: Secondary | ICD-10-CM

## 2011-03-15 DIAGNOSIS — Z7901 Long term (current) use of anticoagulants: Secondary | ICD-10-CM

## 2011-03-15 NOTE — Patient Instructions (Signed)
Patient instructed to take medications as defined in the Anti-coagulation Track section of this encounter.  Patient instructed to take today's dose.  Patient verbalized understanding of these instructions.    

## 2011-03-15 NOTE — Progress Notes (Signed)
Anti-Coagulation Progress Note  [name] is a 40 y.o. female who is currently on an anti-coagulation regimen.    RECENT RESULTS: Recent results are below, the most recent result is correlated with a dose of 71.25 mg. per week: Lab Results  Component Value Date   INR 3.70 03/15/2011   INR 3.00 02/08/2011   INR 2.60 01/11/2011    ANTI-COAG DOSE:   Latest dosing instructions   Total Sun Mon Tue Wed Thu Fri Sat   67.5 11.25 mg 7.5 mg 11.25 mg 7.5 mg 11.25 mg 7.5 mg 11.25 mg    (7.5 mg1.5) (7.5 mg1) (7.5 mg1.5) (7.5 mg1) (7.5 mg1.5) (7.5 mg1) (7.5 mg1.5)         ANTICOAG SUMMARY: Anticoagulation Episode Summary              Current INR goal 2.5-3.5 Next INR check 04/12/2011   INR from last check 3.70! (03/15/2011)     Weekly max dose (mg)  Target end date Indefinite   Indications Pulmonary embolism and infarction, Long term current use of anticoagulant, PULMONARY HYPERTENSION   IN[facility] check location [facility]    Send INR reminders to ANTICOAG [facility]   Comments             ANTICOAG TODAY: Anticoagulation Summary as of 03/15/2011              INR goal 2.5-3.5     Selected INR 3.70! (03/15/2011) Next INR check 04/12/2011   Weekly max dose (mg)  Target end date Indefinite   Indications Pulmonary embolism and infarction, Long term current use of anticoagulant, PULMONARY HYPERTENSION    Anticoagulation Episode Summary              INR check location [facility] Preferred lab    Send INR reminders to ANTICOAG [facility]   Comments             PATIENT INSTRUCTIONS: Patient Instructions  Patient instructed to take medications as defined in the Anti-coagulation Track section of this encounter.  Patient instructed to take today's dose.  Patient verbalized understanding of these instructions.        FOLLOW-UP Return in 4 weeks (on 04/12/2011) for Follow up INR.  Hulen Luster, III Pharm.D., CACP

## 2011-03-15 NOTE — Progress Notes (Signed)
Ms. Duarte apparently has had a history of recurrent thromboembolism as the indication for long-term anticoagulation.  Agree with the above.

## 2011-03-29 ENCOUNTER — Ambulatory Visit (HOSPITAL_COMMUNITY)
Admission: RE | Admit: 2011-03-29 | Discharge: 2011-03-29 | Disposition: A | Discharge status: Home / Self Care | Payer: 59 | Source: Ambulatory Visit | Attending: Internal Medicine | Admitting: Internal Medicine

## 2011-03-29 DIAGNOSIS — Z1231 Encounter for screening mammogram for malignant neoplasm of breast: Secondary | ICD-10-CM | POA: Insufficient documentation

## 2011-03-29 IMAGING — MG MM DIGITAL SCREENING {WH}
4 series · 4 of 4 positions shown · non-contrast
Comparison: none

DG SCREEN MAMMOGRAM BILATERAL
Bilateral CC and MLO view(s) were taken.
Technologist: EDUCATIONORGANIZATION.(EDUCATIONORGANIZATION)(M)

DIGITAL SCREENING MAMMOGRAM WITH CAD:
There are scattered fibroglandular densities.  No masses or malignant type calcifications are 
identified.  Compared with prior studies.
Images were processed with CAD.

[R CC]
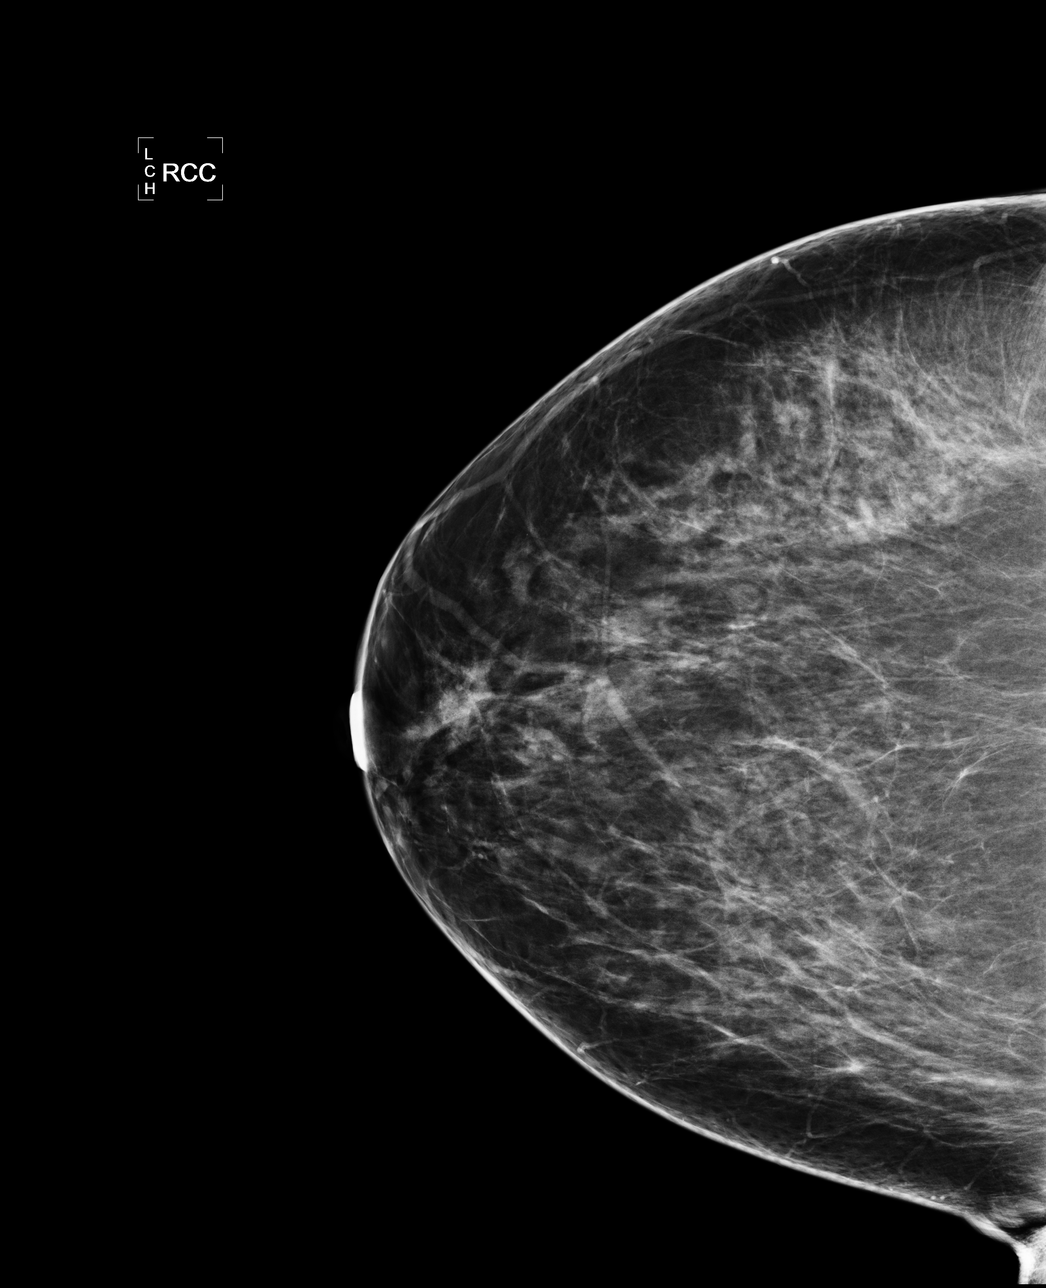

[R MLO]
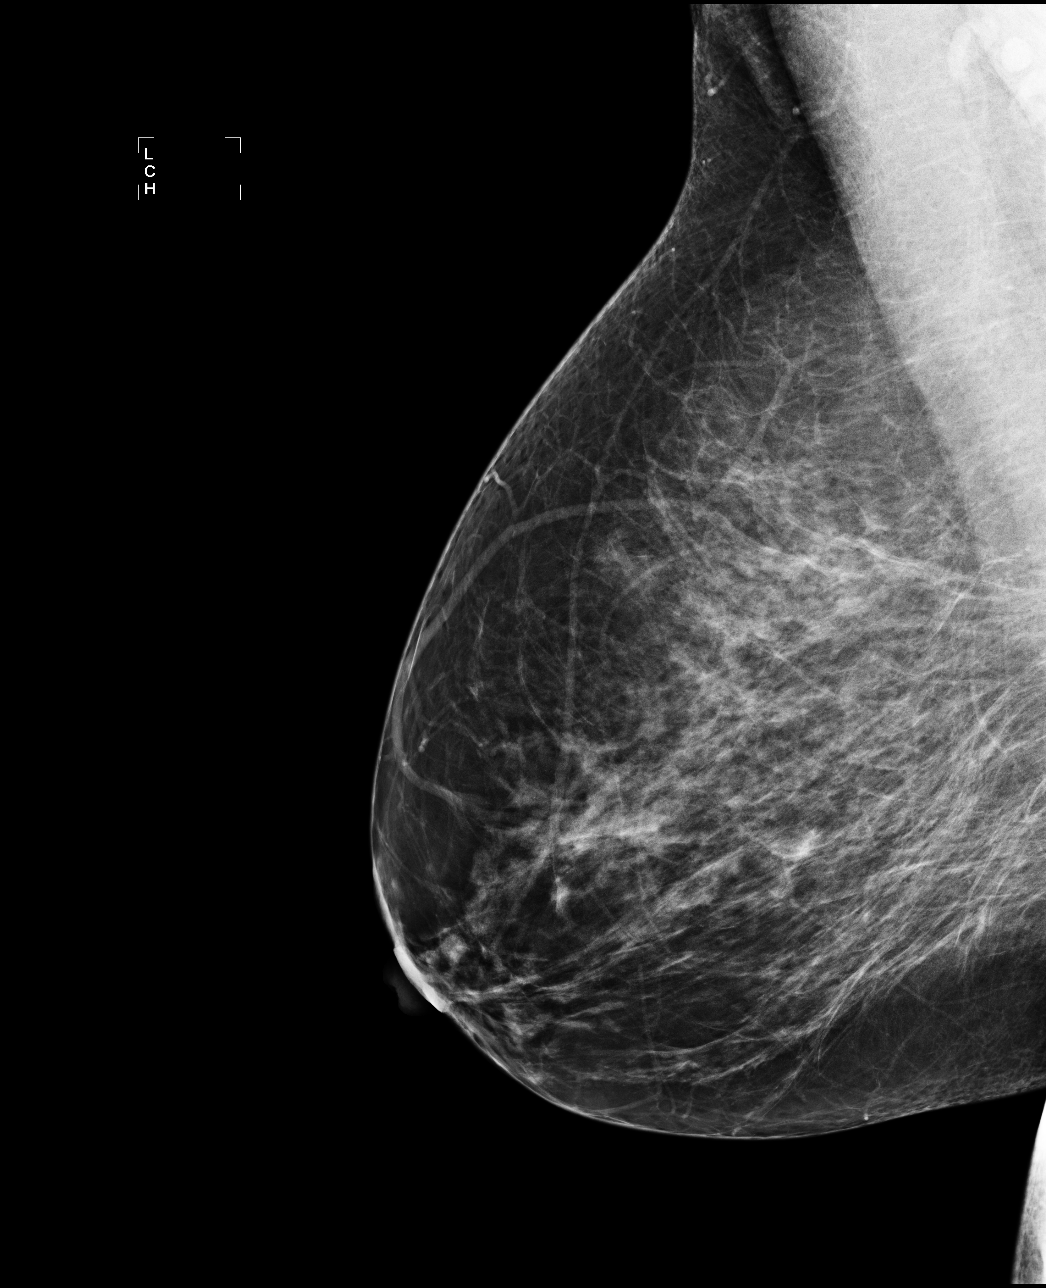

[L CC]
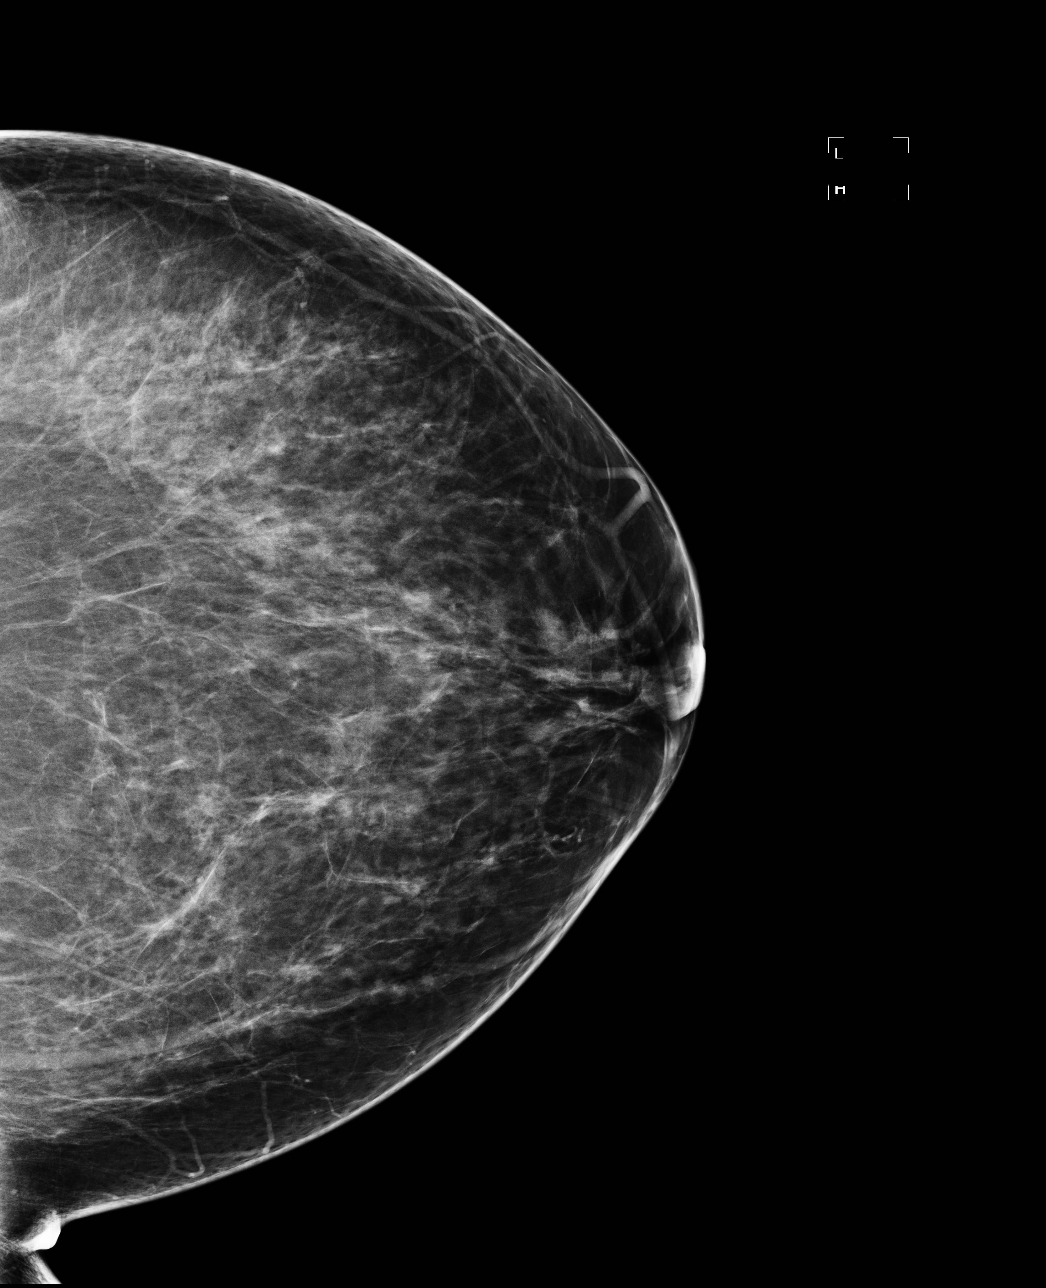

[L MLO]
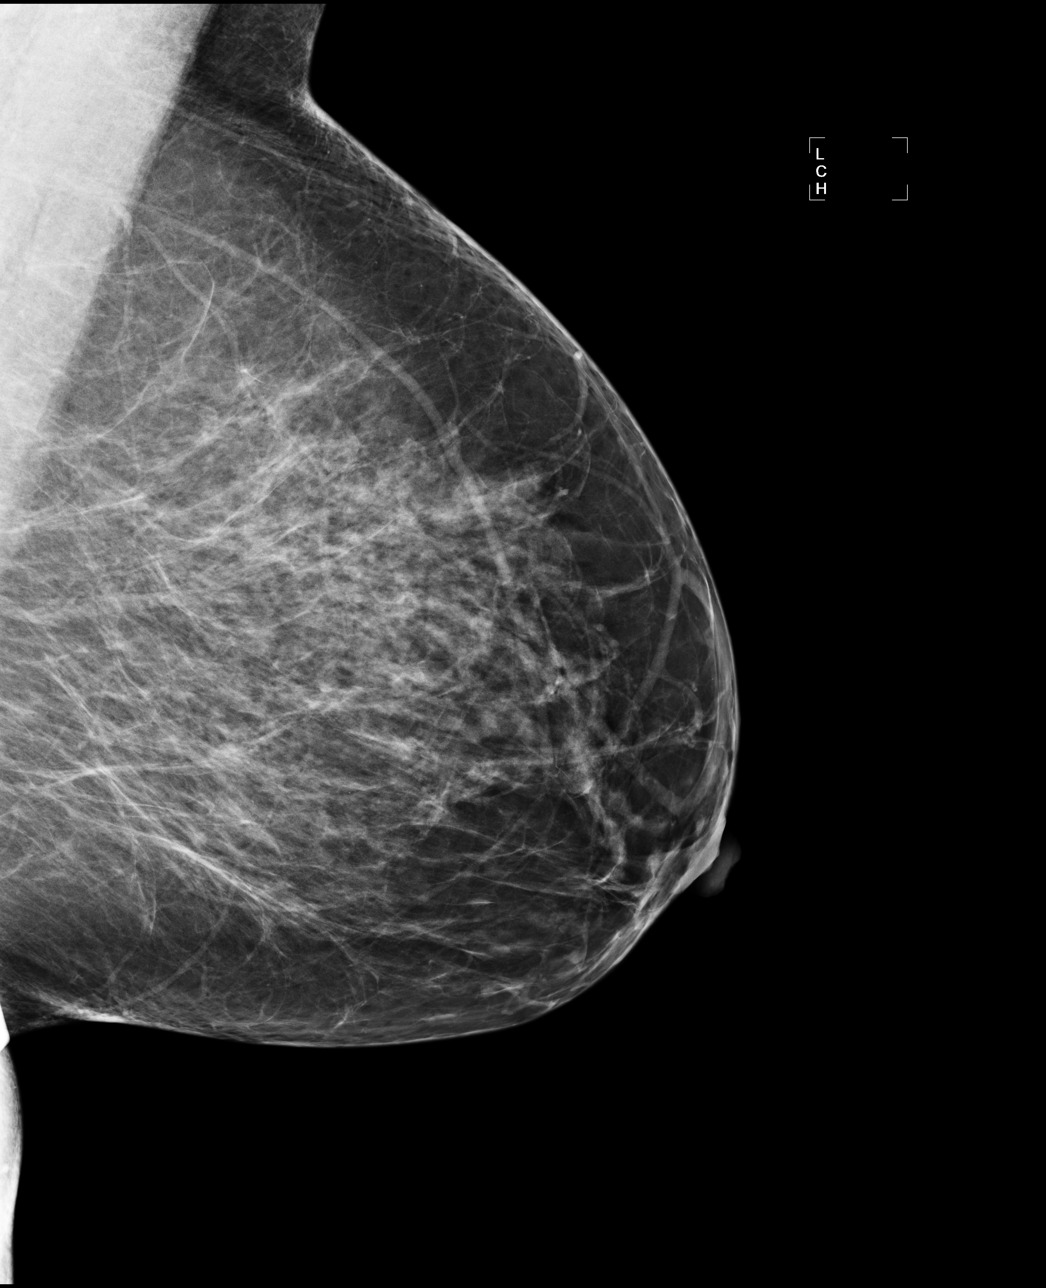

[4 of 4 positions shown; findings below may reference images not displayed]

IMPRESSION: No specific mammographic evidence of malignancy.  Next screening mammogram is recommended in one 
year.

A result letter of this screening mammogram will be mailed directly to the patient.

ASSESSMENT: Negative - BI-RADS 1

Screening mammogram in 1 year.
,

## 2011-04-12 ENCOUNTER — Ambulatory Visit (INDEPENDENT_AMBULATORY_CARE_PROVIDER_SITE_OTHER): Payer: 59 | Admitting: Pharmacist

## 2011-04-12 DIAGNOSIS — I2699 Other pulmonary embolism without acute cor pulmonale: Secondary | ICD-10-CM

## 2011-04-12 DIAGNOSIS — D6859 Other primary thrombophilia: Secondary | ICD-10-CM

## 2011-04-12 DIAGNOSIS — Z7901 Long term (current) use of anticoagulants: Secondary | ICD-10-CM

## 2011-04-12 NOTE — Patient Instructions (Signed)
Patient instructed to take medications as defined in the Anti-coagulation Track section of this encounter.  Patient instructed to take today's dose.  Patient verbalized understanding of these instructions.    

## 2011-04-12 NOTE — Progress Notes (Signed)
Anti-Coagulation Progress Note  [name] is a 40 y.o. female who is currently on an anti-coagulation regimen.    RECENT RESULTS: Recent results are below, the most recent result is correlated with a dose of 67.5 mg. per week: Lab Results  Component Value Date   INR 3.4 04/12/2011   INR 3.70 03/15/2011   INR 3.00 02/08/2011    ANTI-COAG DOSE:   Latest dosing instructions   Total Sun Mon Tue Wed Thu Fri Sat   67.5 11.25 mg 7.5 mg 11.25 mg 7.5 mg 11.25 mg 7.5 mg 11.25 mg    (7.5 mg1.5) (7.5 mg1) (7.5 mg1.5) (7.5 mg1) (7.5 mg1.5) (7.5 mg1) (7.5 mg1.5)         ANTICOAG SUMMARY: Anticoagulation Episode Summary              Current INR goal 2.5-3.5 Next INR check 05/10/2011   INR from last check 3.4 (04/12/2011)     Weekly max dose (mg)  Target end date Indefinite   Indications Pulmonary embolism and infarction, Long term current use of anticoagulant, PULMONARY HYPERTENSION   IN[facility] check location [facility] Preferred lab    Send INR reminders to ANTICOAG IMP   Comments             ANTICOAG TODAY: Anticoagulation Summary as of 04/12/2011              INR goal 2.5-3.5     Selected INR 3.4 (04/12/2011) Next INR check 05/10/2011   Weekly max dose (mg)  Target end date Indefinite   Indications Pulmonary embolism and infarction, Long term current use of anticoagulant, PULMONARY HYPERTENSION    Anticoagulation Episode Summary              INR check location [facility] Preferred lab    Send INR reminders to ANTICOAG IMP   Comments             PATIENT INSTRUCTIONS: Patient Instructions  Patient instructed to take medications as defined in the Anti-coagulation Track section of this encounter.  Patient instructed to take today's dose.  Patient verbalized understanding of these instructions.        FOLLOW-UP Return in 4 weeks (on 05/10/2011) for Follow up INR.  Hulen Luster, III Pharm.D., CACP

## 2011-04-19 ENCOUNTER — Encounter: Payer: Self-pay | Admitting: Internal Medicine

## 2011-04-19 DIAGNOSIS — B1910 Unspecified viral hepatitis B without hepatic coma: Secondary | ICD-10-CM | POA: Insufficient documentation

## 2011-04-28 LAB — POCT I-STAT, CHEM 8
BUN: 10
Creatinine, Ser: 1
Glucose, Bld: 98
Hemoglobin: 14.6
Potassium: 3.6

## 2011-05-10 ENCOUNTER — Other Ambulatory Visit: Payer: Self-pay | Admitting: Internal Medicine

## 2011-05-10 ENCOUNTER — Ambulatory Visit (INDEPENDENT_AMBULATORY_CARE_PROVIDER_SITE_OTHER): Payer: 59 | Admitting: Pharmacist

## 2011-05-10 DIAGNOSIS — D6859 Other primary thrombophilia: Secondary | ICD-10-CM

## 2011-05-10 DIAGNOSIS — I2699 Other pulmonary embolism without acute cor pulmonale: Secondary | ICD-10-CM

## 2011-05-10 DIAGNOSIS — Z7901 Long term (current) use of anticoagulants: Secondary | ICD-10-CM

## 2011-05-10 LAB — POCT INR: INR: 2.8

## 2011-05-10 MED ORDER — WARFARIN SODIUM 7.5 MG PO TABS: 7.5000 mg | ORAL_TABLET | ORAL | Status: DC

## 2011-05-10 NOTE — Patient Instructions (Signed)
Patient instructed to take medications as defined in the Anti-coagulation Track section of this encounter.  Patient instructed to take today's dose.  Patient verbalized understanding of these instructions.    

## 2011-05-10 NOTE — Progress Notes (Signed)
Anti-Coagulation Progress Note  [name] is a 40 y.o. female who is currently on an anti-coagulation regimen.    RECENT RESULTS: Recent results are below, the most recent result is correlated with a dose of 67.5 mg. per week: Lab Results  Component Value Date   INR 2.80 05/10/2011   INR 3.4 04/12/2011   INR 3.70 03/15/2011    ANTI-COAG DOSE:   Latest dosing instructions   Total Sun Mon Tue Wed Thu Fri Sat   67.5 11.25 mg 7.5 mg 11.25 mg 7.5 mg 11.25 mg 7.5 mg 11.25 mg    (7.5 mg1.5) (7.5 mg1) (7.5 mg1.5) (7.5 mg1) (7.5 mg1.5) (7.5 mg1) (7.5 mg1.5)         ANTICOAG SUMMARY: Anticoagulation Episode Summary              Current INR goal 2.5-3.5 Next INR check 06/07/2011   INR from last check 2.80 (05/10/2011)     Weekly max dose (mg)  Target end date Indefinite   Indications Pulmonary embolism and infarction, Long term current use of anticoagulant   INR check location [facility] Preferred lab    Send INR reminders to ANTICOAG IMP   Comments             ANTICOAG TODAY: Anticoagulation Summary as of 05/10/2011              INR goal 2.5-3.5     Selected INR 2.80 (05/10/2011) Next INR check 06/07/2011   Weekly max dose (mg)  Target end date Indefinite   Indications Pulmonary embolism and infarction, Long term current use of anticoagulant    Anticoagulation Episode Summary              INR check location [facility] Preferred lab    Send INR reminders to ANTICOAG IMP   Comments             PATIENT INSTRUCTIONS: Patient Instructions  Patient instructed to take medications as defined in the Anti-coagulation Track section of this encounter.  Patient instructed to take today's dose.  Patient verbalized understanding of these instructions.        FOLLOW-UP Return in 4 weeks (on 06/07/2011) for Follow up INR.  Hulen Luster, III Pharm.D., CACP

## 2011-05-11 LAB — D-DIMER, QUANTITATIVE: D-Dimer, Quant: 0.22

## 2011-05-11 LAB — DIFFERENTIAL
Basophils Absolute: 0
Lymphocytes Relative: 31
Lymphs Abs: 1.7
Neutro Abs: 2.7
Neutrophils Relative %: 51

## 2011-05-11 LAB — POCT CARDIAC MARKERS
CKMB, poc: 1.1
Myoglobin, poc: 88.1

## 2011-05-11 LAB — CBC
HCT: 43.6
Hemoglobin: 14.6
MCHC: 33.5
MCV: 86.8
RBC: 5.02

## 2011-05-11 LAB — COMPREHENSIVE METABOLIC PANEL
BUN: 12
CO2: 26
Calcium: 9
Creatinine, Ser: 0.91
GFR calc Af Amer: >60
GFR calc non Af Amer: >60
Glucose, Bld: 116 — ABNORMAL HIGH
Total Bilirubin: 0.7

## 2011-05-11 LAB — LIPASE, BLOOD: Lipase: 31

## 2011-05-19 ENCOUNTER — Telehealth: Payer: Self-pay | Admitting: *Deleted

## 2011-05-19 NOTE — Telephone Encounter (Signed)
Call from pt stating is having sharp pain to chest area. Rates pain 5/10.  Onset yesterday and states pain is on and off.   Denies SOB, nausea. diaphoresis She has a scheduled appointment 10/22 but can't wait until then. No other c/o We can not see pt today. Hx: cardiac/PE I advised visit to Ed for evaluation NOW.  Pt voices understanding and will go to ED now.

## 2011-05-19 NOTE — Telephone Encounter (Signed)
agree

## 2011-05-24 ENCOUNTER — Inpatient Hospital Stay (HOSPITAL_COMMUNITY)
Admission: AD | Admit: 2011-05-24 | Discharge: 2011-05-26 | DRG: 176 | Disposition: A | Discharge status: Home / Self Care | Payer: 59 | Source: Ambulatory Visit | Attending: Internal Medicine | Admitting: Internal Medicine

## 2011-05-24 ENCOUNTER — Inpatient Hospital Stay (HOSPITAL_COMMUNITY): Payer: 59

## 2011-05-24 ENCOUNTER — Encounter: Payer: Self-pay | Admitting: Internal Medicine

## 2011-05-24 ENCOUNTER — Ambulatory Visit (INDEPENDENT_AMBULATORY_CARE_PROVIDER_SITE_OTHER): Payer: 59 | Admitting: Internal Medicine

## 2011-05-24 VITALS — BP 115/74 | HR 80 | Temp 97.6°F | Wt 194.1 lb

## 2011-05-24 DIAGNOSIS — D6859 Other primary thrombophilia: Secondary | ICD-10-CM

## 2011-05-24 DIAGNOSIS — R0789 Other chest pain: Secondary | ICD-10-CM | POA: Diagnosis present

## 2011-05-24 DIAGNOSIS — I2699 Other pulmonary embolism without acute cor pulmonale: Secondary | ICD-10-CM

## 2011-05-24 DIAGNOSIS — M7989 Other specified soft tissue disorders: Secondary | ICD-10-CM | POA: Diagnosis present

## 2011-05-24 DIAGNOSIS — I2789 Other specified pulmonary heart diseases: Secondary | ICD-10-CM | POA: Diagnosis present

## 2011-05-24 DIAGNOSIS — D573 Sickle-cell trait: Secondary | ICD-10-CM | POA: Diagnosis present

## 2011-05-24 DIAGNOSIS — Z7901 Long term (current) use of anticoagulants: Secondary | ICD-10-CM

## 2011-05-24 DIAGNOSIS — R079 Chest pain, unspecified: Secondary | ICD-10-CM | POA: Insufficient documentation

## 2011-05-24 DIAGNOSIS — Z86718 Personal history of other venous thrombosis and embolism: Secondary | ICD-10-CM

## 2011-05-24 DIAGNOSIS — Z86711 Personal history of pulmonary embolism: Secondary | ICD-10-CM

## 2011-05-24 DIAGNOSIS — D6861 Antiphospholipid syndrome: Secondary | ICD-10-CM | POA: Insufficient documentation

## 2011-05-24 DIAGNOSIS — J309 Allergic rhinitis, unspecified: Secondary | ICD-10-CM | POA: Diagnosis present

## 2011-05-24 LAB — CBC WITH DIFFERENTIAL/PLATELET
Basophils Absolute: 0 10*3/uL (ref 0.0–0.1)
Lymphocytes Relative: 44 % (ref 12–46)
Lymphs Abs: 1.7 10*3/uL (ref 0.7–4.0)
MCV: 84 fL (ref 78.0–100.0)
Neutro Abs: 1.5 10*3/uL — ABNORMAL LOW (ref 1.7–7.7)
Neutrophils Relative %: 38 % — ABNORMAL LOW (ref 43–77)
Platelets: 253 10*3/uL (ref 150–400)
RBC: 4.82 MIL/uL (ref 3.87–5.11)
RDW: 14 % (ref 11.5–15.5)
WBC: 4 10*3/uL (ref 4.0–10.5)

## 2011-05-24 LAB — COMPREHENSIVE METABOLIC PANEL
AST: 20 U/L (ref 0–37)
Albumin: 3.6 g/dL (ref 3.5–5.2)
Alkaline Phosphatase: 67 U/L (ref 39–117)
BUN: 9 mg/dL (ref 6–23)
Calcium: 9.7 mg/dL (ref 8.4–10.5)
Chloride: 103 mEq/L (ref 96–112)
Glucose, Bld: 95 mg/dL (ref 70–99)
Potassium: 4 mEq/L (ref 3.5–5.3)
Sodium: 139 mEq/L (ref 135–145)
Total Protein: 7.3 g/dL (ref 6.0–8.3)

## 2011-05-24 LAB — CK TOTAL AND CKMB (NOT AT ARMC)
CK, MB: 2.5 ng/mL (ref 0.3–4.0)
Relative Index: 1.3 (ref 0.0–2.5)
Total CK: 188 U/L — ABNORMAL HIGH (ref 7–177)

## 2011-05-24 LAB — POCT GLYCOSYLATED HEMOGLOBIN (HGB A1C): Hemoglobin A1C: 5.5

## 2011-05-24 LAB — CARDIAC PANEL(CRET KIN+CKTOT+MB+TROPI)
CK, MB: 2.4 ng/mL (ref 0.3–4.0)
Relative Index: 1.5 (ref 0.0–2.5)
Total CK: 158 U/L (ref 7–177)
Troponin I: <0.3 ng/mL (ref <0.30)

## 2011-05-24 LAB — GLUCOSE, CAPILLARY: Glucose-Capillary: 100 mg/dL — ABNORMAL HIGH (ref 70–99)

## 2011-05-24 LAB — HCG, QUANTITATIVE, PREGNANCY: hCG, Beta Chain, Quant, S: <1 m[IU]/mL

## 2011-05-24 LAB — LIPID PANEL
HDL: 39 mg/dL — ABNORMAL LOW (ref >39)
LDL Cholesterol: 144 mg/dL — ABNORMAL HIGH (ref 0–99)
Triglycerides: 108 mg/dL (ref <150)

## 2011-05-24 IMAGING — CT CT ANGIO CHEST
2 of 6 series · 19 of 46 positions shown · IV contrast (APPLIED)
Comparison: [DATE].

CLINICAL DATA: Chest pain and leg edema for 2 days.  Left chest
wall pain.  Pleuritic chest pain.

CT ANGIOGRAPHY CHEST WITH CONTRAST
TECHNIQUE: Multidetector CT imaging of the chest was performed
using the standard protocol during bolus administration of
intravenous contrast.  Multiplanar CT image reconstructions
including MIPs were obtained to evaluate the vascular anatomy.
Contrast: 100mL OMNIPAQUE IOHEXOL 300 MG/ML IV SOLN

[Series 8: pulm embolism 1.0 b25f thin · axial · 0.55mm/px · z∈[-216,-35]mm · 17 of 199 slices shown]
[im 9/199  lung]
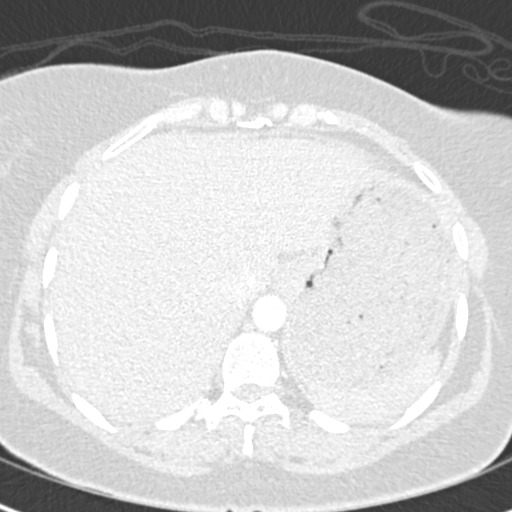
[im 18/199  soft-tissue]
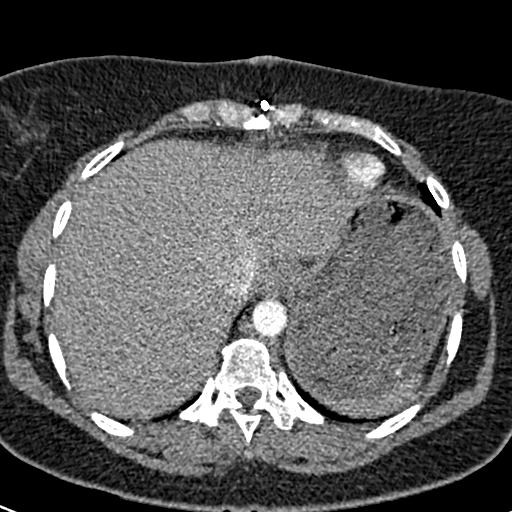
[im 35/199  lung]
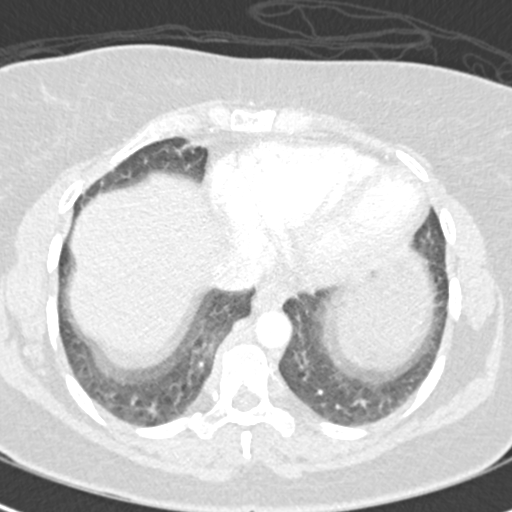
[im 44/199  soft-tissue]
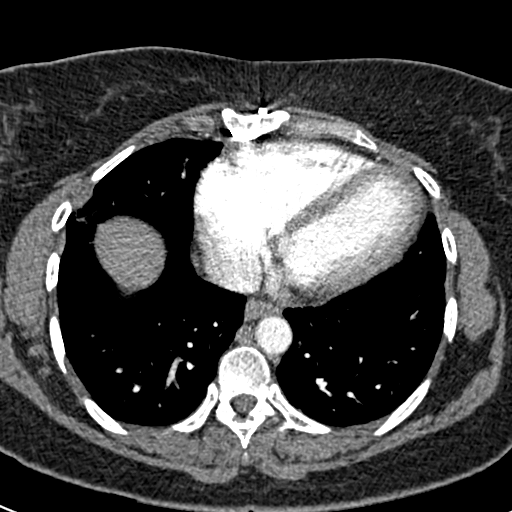
[im 52/199  lung]
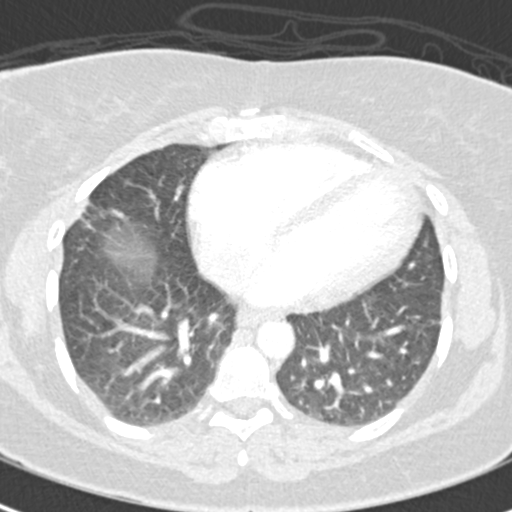
[im 69/199  soft-tissue]
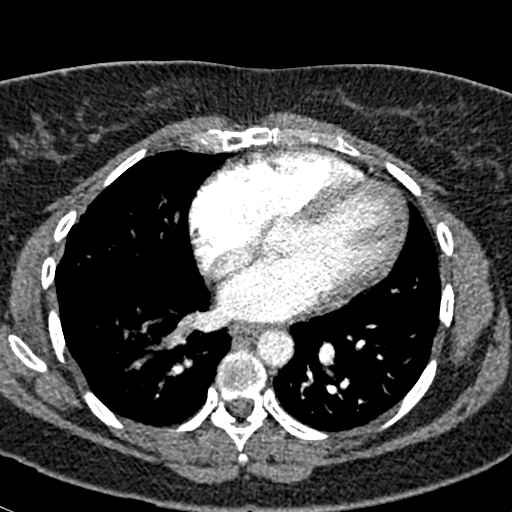
[im 78/199  lung]
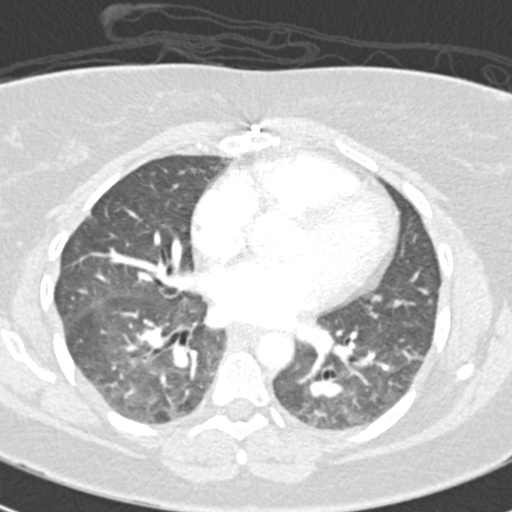
[im 87/199  soft-tissue]
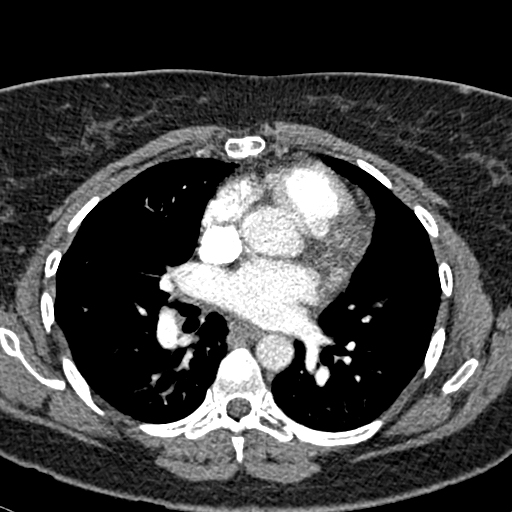
[im 104/199  lung]
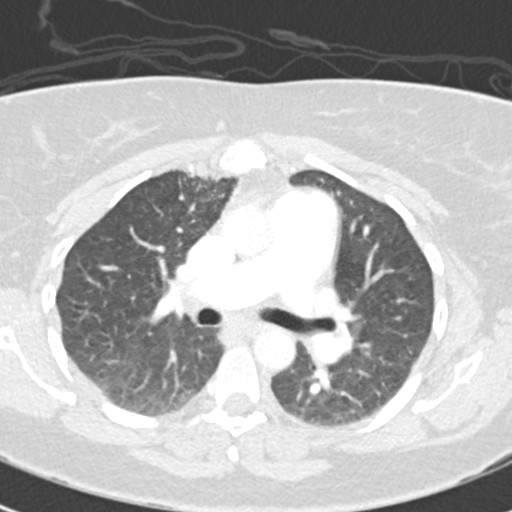
[im 112/199  soft-tissue]
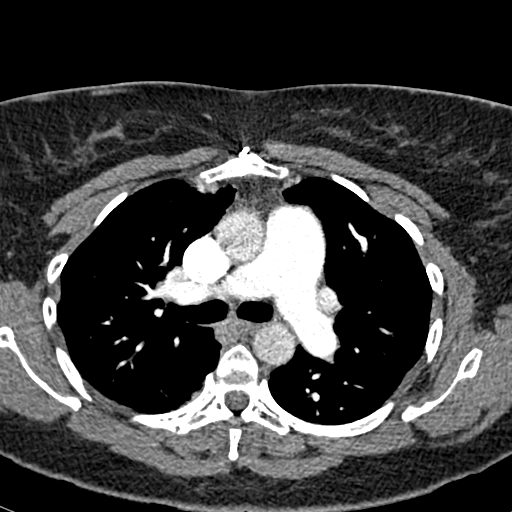
[im 121/199  lung]
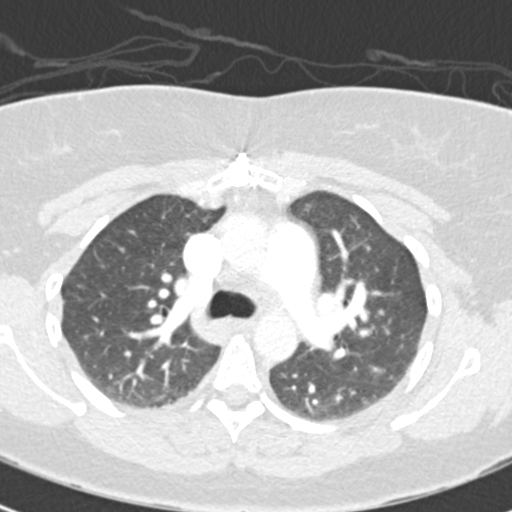
[im 130/199  soft-tissue]
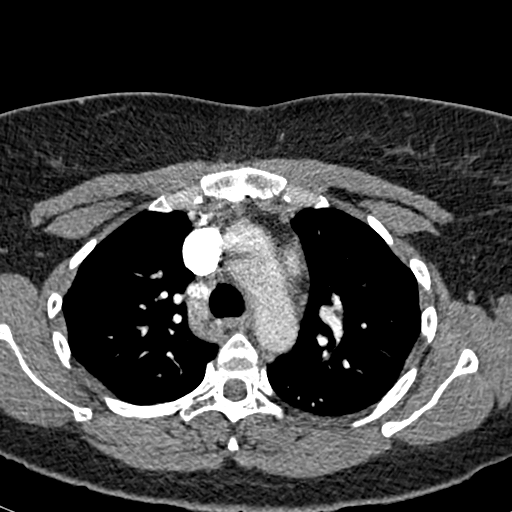
[im 147/199  lung]
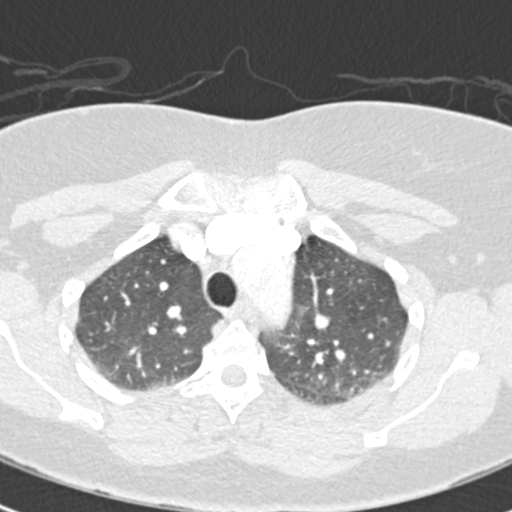
[im 155/199  soft-tissue]
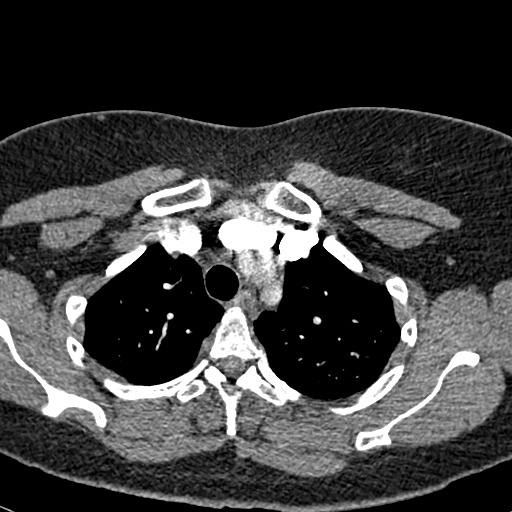
[im 164/199  lung]
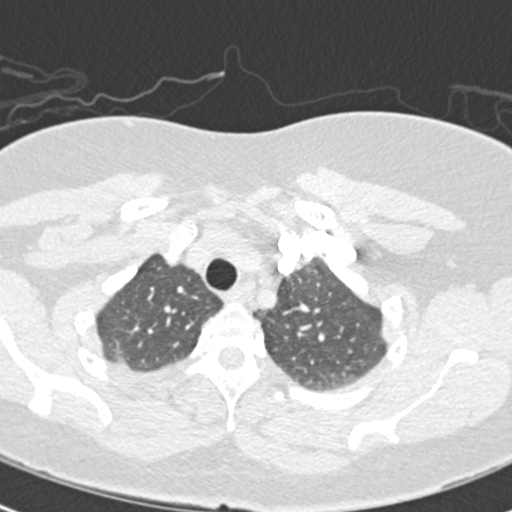
[im 181/199  soft-tissue]
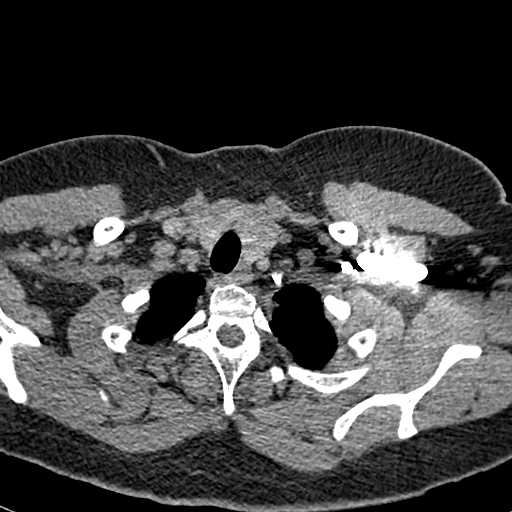
[im 190/199  lung]
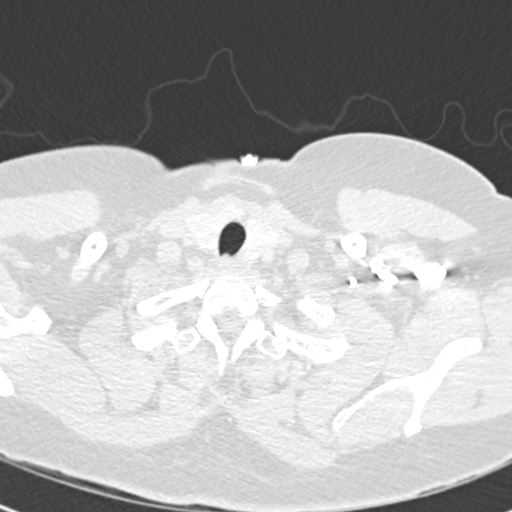

[Series 602: coronals · coronal · 0.55mm/px · 2 of 74 slices shown]
[im 25/74  soft-tissue]
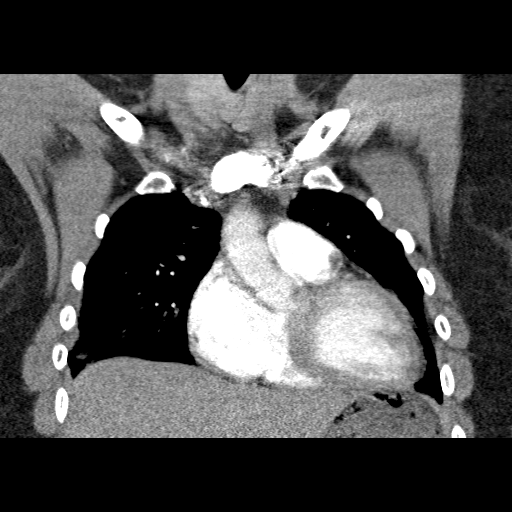
[im 49/74  soft-tissue]
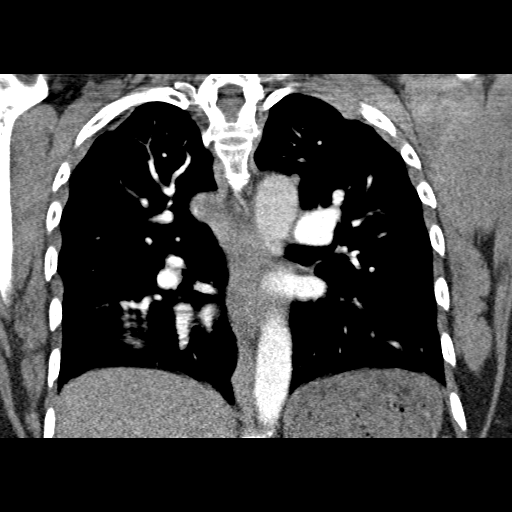

[19 of 46 positions shown; findings below may reference images not displayed]

FINDINGS: Moderate respiratory motion is present on the
examination.  This precludes evaluation of subsegmental pulmonary
vessels for pulmonary embolus.  More proximal vessels are well
visualized and appear normal.  There is enlargement of the main
pulmonary artery at 30 mm, with distal tapering of the vessels
suggesting pulmonary arterial hypertension.  There is no axillary
adenopathy.  No mediastinal or hilar adenopathy.  No effusion.
Lungs demonstrate dependent atelectasis.  Median sternotomy.
Surgical clips are present compatible with PDA closure. No
aggressive osseous lesions are present.  Scattered Schmorl's nodes
are present in the thoracic spine.

Review of the MIP images confirms the above findings.
IMPRESSION: 1.  No central pulmonary embolus.  Respiratory motion precludes
evaluation of subsegmental vessels.
2.  No acute abnormality.
3.  Median sternotomy for PDA closure.
4.  Pulmonary arterial hypertension.

## 2011-05-24 MED ORDER — IOHEXOL 300 MG/ML  SOLN
100.0000 mL | Freq: Once | INTRAMUSCULAR | Status: AC | PRN
  Administered 2011-05-24: 100 mL via INTRAVENOUS

## 2011-05-24 NOTE — Assessment & Plan Note (Signed)
Concerning for PE especially with significant history . Today's INR is 1.9. Per Dr Alexandria Lodge rec. The INR goal was set for 2.5-3.5 ( last INR on 10/8 2.8). Will admit patient for further evaluation and management. Discussion with Dr Alexandria Lodge noted that she is high risk for PE and DVT.

## 2011-05-24 NOTE — Assessment & Plan Note (Signed)
Concerning for DVT. Will admit patient for further evaluation and mangement. Consider lower extremities doppler.

## 2011-05-24 NOTE — Progress Notes (Signed)
  Subjective:   Patient ID: [name] female   DOB: 11-23-1970 40 y.o.   MRN: [id number]  HPI: [name] is a 40 y.o. female with PMH significant as outlined below who presented to the clinic for chest pain. She noted that she has on a regular basis chest pain on and off but it normally goes away but since 5 days she noticed that the chest pain has not improved . It is  constant, sharp in character and on the  left side with radiating to the left shoulder and back , worse with inspiration. Denies SOB,no dizziness, no trauma, no travel recently. No fevers or chills . Noted some headache. Furthermore she is reports about Leg swelling and pain in the right leg since couple  of day. Normal she has swelling in her legs but never  pain full.     Past Medical History  Diagnosis Date  . Pulmonary embolism 2005    Massive  . Pulmonary hypertension     2/2 chronic PE. Sees Dr. Janee Morn, pulmonologist at [facility]  . Primary hypercoagulable state     No details known. Needs review  . Hepatitis B infection     Hep B core Ab 04/2005  . IUD     Mirena  . DVT (deep venous thrombosis)    Current Outpatient Prescriptions  Medication Sig Dispense Refill  . acetaminophen (TYLENOL) 325 MG tablet Take 650 mg by mouth every 8 (eight) hours as needed.        . cetirizine (ZYRTEC ALLERGY) 10 MG tablet Take 10 mg by mouth daily as needed.        . clotrimazole-betamethasone (LOTRISONE) cream Apply topically 2 (two) times daily. To affected area       . fluticasone (FLONASE) 50 MCG/ACT nasal spray 1 spray by Nasal route daily.  16 g  2  . sodium chloride (OCEAN) 0.65 % SOLN nasal spray 1 spray by Nasal route every 6 (six) hours.  90 mL  6  . spironolactone (ALDACTONE) 25 MG tablet Take 25 mg by mouth daily.        Marland Kitchen warfarin (COUMADIN) 7.5 MG tablet TAKE AS DIRECTED  150 tablet  0   Review of Systems: Constitutional: Denies fever, chills, diaphoresis, appetite change and fatigue.    Respiratory:  Denies SOB, DOE, cough, chest tightness,  and wheezing.   Gastrointestinal: Denies nausea, vomiting, abdominal pain, diarrhea, constipation, blood in stool and abdominal distention.  Genitourinary: Denies dysuria, urgency, frequency, hematuria, flank pain and difficulty urinating.  Skin: Denies pallor, rash and wound.  Neurological: Denies dizziness, seizures, syncope, weakness, light-headedness, numbness   Objective:  Physical Exam: Filed Vitals:   05/24/11 0854  BP: 115/74  Pulse: 80  Temp: 97.6 F (36.4 C)  TempSrc: Oral  Weight: 194 lb 1.6 oz (88.043 kg)   Constitutional: Vital signs reviewed.  Patient is a well-developed and well-nourished  in no acute distress and cooperative with exam. Alert and oriented x3.   Neck: Supple, Trachea midline normal ROM, No JVD, mass, thyromegaly, or carotid bruit present.  Cardiovascular: RRR, S1 normal, S2 normal, significant tenderness on palpation Pulmonary/Chest: CTAB, no wheezes, rales, or rhonchi Abdominal: Soft. Non-tender, non-distended, bowel sounds are normal, no masses, organomegaly, or guarding present.  Extremities: significant right leg swelling with tenderness on palpation. No erythema noted. Decreased pulses noted.  Neurological: A&O x3, no focal motor deficit, sensory intact to light touch bilaterally.

## 2011-05-24 NOTE — H&P (Signed)
Hospital Admission Note Date: 05/24/2011  Patient name:  [name]   Medical record number:  [id number] Date of birth:  [birth year]  Age: 40 y.o. Gender:  female PCP:    Lyn Hollingshead, MD  Medical Service:   Internal Medicine Teaching Service   Attending physician:  Dr. Cliffton Asters First Contact:   Dr. Manson Passey  Pager: [phone] Second Contact:   Dr. Saralyn Pilar    Pager: [phone] After Hours:    First Contact   Pager: [phone]      Second Contact  Pager: [phone]   Chief Complaint:  Chest pain  History of Present Illness: Patient is a 40 y.o. female with a PMHx of anti phospholipid antibody and multiple DVT and PE while on coumadin (therapeutuc range) and with IVC filter, sever pulmonary HTN followed by pulmonologist at [facility], sickle cell trait who presents to [facility] for chest pain and leg edema. Symptoms ongoing for past 2 days. Chest pain started all of a sudden while she was seated, sharp shooting pain from left chest wall migrating to her back, worse with deep breathing and climbing upstairs, similar to previous chest pains but worse, not associated with shortness of breath or syncope or hemoptysis. She also has been having worsening swelling of her right leg and severe tenderness around calf area and difficulty to ambulate. She had previous clots in the right leg and she feels the symptoms are similar to her previous DVTs.  She has been compliant with her medications including coumadin. She has not started any new medications lately. She has been taking tylenol for pain but not more than 1 per day. She denies fever, chills, or any other complaints.   Current Outpatient Medications: Current Outpatient Prescriptions  Medication Sig Dispense Refill  . acetaminophen (TYLENOL) 325 MG tablet Take 650 mg by mouth every 8 (eight) hours as needed.        . cetirizine (ZYRTEC ALLERGY) 10 MG tablet Take 10 mg by mouth daily as needed.        . fluticasone (FLONASE) 50 MCG/ACT nasal spray 1  spray by Nasal route daily.  16 g  2  . sodium chloride (OCEAN) 0.65 % SOLN nasal spray 1 spray by Nasal route every 6 (six) hours.  90 mL  6  . spironolactone (ALDACTONE) 25 MG tablet Take 25 mg by mouth daily.        Marland Kitchen warfarin (COUMADIN) 7.5 MG tablet TAKE AS DIRECTED  150 tablet  0    Allergies: Allergies  Allergen Reactions  . Aspirin GI distress     Past Medical History: Past Medical History  Diagnosis Date  . Pulmonary embolism 2005    Massive  . Pulmonary hypertension     2/2 chronic PE. Sees Dr. Janee Morn, pulmonologist at [facility]  . Primary hypercoagulable state     Anti Phospholipid antibody syndrome.  On chronic coumadin Multiple DVT and PE while on therapeutic coumadin, her range is 2.5 to 3.5.  Multiple abortions in past due to the syndrome.  has chornic PEs  . Hepatitis B infection     Hep B core Ab 04/2005  . Sickle cell trait   -  Chronic chest pain due to pul HTN and chronic PE  . DVT (deep venous thrombosis)     Past Surgical History: Past Surgical History  Procedure Date  . Pulmonary artery endarterectomy   . Cardiac catheterization 11/2005    R heart cath : Severe pulmonary arterial HTN with evidence of cor pulmonale.  Vasodilatory challenge not performed.    Family History: Family History  Problem Relation Age of Onset  . Hypertension Mother     Social History: History   Social History  . Marital Status: Married    Spouse Name: N/A    Number of Children: N/A  . Years of Education: N/A   Occupational History  . Not on file.   Social History Main Topics  . Smoking status: Never Smoker   . Smokeless tobacco: Not on file  . Alcohol Use: Not on file  . Drug Use: No  . Sexually Active: Not on file   Other Topics Concern  . Not on file   Social History Narrative   Pt lives in [place], braids hair for a living. Lives with her husband who is a Health visitor at [employer]. Has united health care insurance plan. Denies ever smoking,  drinking or alcohol. Is from [place], [place] is her primary language, she has been in the [place] for past 12 years and is able to communicate in [place].     Review of Systems: Constitutional:  Denies fever, chills, diaphoresis, appetite change and fatigue.  HEENT: Denies photophobia, eye pain, redness, hearing loss, ear pain, congestion, sore throat, rhinorrhea, sneezing, mouth sores, trouble swallowing, neck pain, neck stiffness and tinnitus.  Respiratory: As per HPI  Cardiovascular: Denies chest pain, palpitations and leg swelling.  Gastrointestinal: Denies nausea, vomiting, abdominal pain, diarrhea, constipation, blood in stool and abdominal distention.  Genitourinary: Denies dysuria, urgency, frequency, hematuria, flank pain and difficulty urinating.  Musculoskeletal: Denies myalgias, back pain, joint swelling, arthralgias and gait problem.   Skin: Denies pallor, rash and wound.  Neurological: Denies dizziness, seizures, syncope, weakness, light-headedness, numbness and headaches.   Hematological: Denies adenopathy. Easy bruising, personal or family bleeding history.  Psychiatric/ Behavioral: Denies suicidal ideation, mood changes, confusion, nervousness, sleep disturbance and agitation.    Vital Signs:  BP:  115/74   RR 12  Pulse:  80   Temp:  97.6 F (36.4 C)   TempSrc:  Oral   Weight:  194 lb 1.6 oz (88.043 kg)    Constitutional: Vital signs reviewed. Patient is a well-developed and well-nourished in no acute distress and cooperative with exam. Alert and oriented x3.  Neck: Supple, Trachea midline normal ROM, No JVD, mass, thyromegaly, or carotid bruit present.  Cardiovascular: RRR, S1 normal, S2 normal, significant tenderness on palpation  Pulmonary/Chest: CTAB, no wheezes, rales, or rhonchi  Abdominal: Soft. Non-tender, non-distended, bowel sounds are normal, no masses, organomegaly, or guarding present.  Extremities: significant right leg swelling with tenderness on  palpation. No erythema noted. Decreased pulses noted.  Neurological: A&O x3, no focal motor deficit, sensory intact to light touch bilaterally.    Lab results: CBG: Recent Labs  Basename 05/24/11 1001   GLUCAP 100*   Hemoglobin A1C: Recent Labs  Basename 05/24/11 1012   HGBA1C 5.5   INR- 1.9 (Was 2.8)    Imaging results:  No results found.   Other results: EKG: pending  Problem List 1. Chest pain with high suspicion for acute PE 2. Right leg swelling with high suspicion for DVT 3. Anti-phospholipid antibody syndrome, sub therapeutic INR 4. Pulmonary HTN 5. Sickle cell trait  Assessment & Plan:  40 y/o W with pmh of anti phospho lipid antibody syndrome, recurrent PE and DVT while ebing on therapeutic coumadin and IVC filter in place comes to [facility] with symptoms which she describes as typical for her previous PEs but much worse  and a subtherapeutic INR.   1. Chest pain: Given the typical symptoms, physical exam findings of DVT in Rt leg, subtherapeutic INR, PE is most likely diagnosis. ACS, Pul HTN, musculo-skeletal pain, pericarditis are other possible differential but less likely. Will purse the other differentials if PE is ruled out.  - Will check basic chemistry and CBC - Will start patient on full dose lovenox and continue coumadin for a therapeutic range of 2.5-3.5 - Will get CT angio chest and lower extremity dopplers - Patient is hemodynamically stable. If her pain is controlled in next 1 day, she may be able to go home with lovenox and coumadin and OPC follow up.   2. Right leg swelling- r/o DVt with dopplers. lovenox and coumadin. vicodin for pain 3. Anti-phospholipid antibody syndrome: continue coumadin 4. Pulmonary HTN- continue aldactone      Bethel Born, M.D. (PGY3):  ____________________________________    Date/ Time:     ____________________________________        I have seen and examined the patient. I reviewed the resident/fellow note and  agree with the findings and plan of care as documented. My additions and revisions are included.   Signature:  ____________________________________________     Internal Medicine Teaching Service Attending    Date:    ____________________________________________

## 2011-05-24 NOTE — Progress Notes (Signed)
Addended by: Bufford Spikes on: 05/24/2011 12:27 PM   Modules accepted: Orders

## 2011-05-24 NOTE — Progress Notes (Signed)
Addended by: Maura Crandall on: 05/24/2011 10:58 AM   Modules accepted: Orders

## 2011-05-24 NOTE — Progress Notes (Signed)
#  22G angio NSL started in right wrist; flushed w/o difficulty; site unremarkable. Wynetta Seith,RN 05/24/11 @ 1100AM

## 2011-05-24 NOTE — Progress Notes (Signed)
Addended by: Maura Crandall on: 05/24/2011 12:14 PM   Modules accepted: Orders

## 2011-05-25 DIAGNOSIS — M79609 Pain in unspecified limb: Secondary | ICD-10-CM

## 2011-05-25 LAB — CARDIAC PANEL(CRET KIN+CKTOT+MB+TROPI)
CK, MB: 2.1 ng/mL (ref 0.3–4.0)
CK, MB: 2 ng/mL (ref 0.3–4.0)
Total CK: 140 U/L (ref 7–177)
Troponin I: <0.3 ng/mL (ref <0.30)

## 2011-05-25 LAB — PROTIME-INR: Prothrombin Time: 19.5 seconds — ABNORMAL HIGH (ref 11.6–15.2)

## 2011-05-26 LAB — CBC
MCV: 83.9 fL (ref 78.0–100.0)
Platelets: 231 10*3/uL (ref 150–400)
RBC: 4.65 MIL/uL (ref 3.87–5.11)
RDW: 13.9 % (ref 11.5–15.5)
WBC: 4.3 10*3/uL (ref 4.0–10.5)

## 2011-05-26 LAB — PROTIME-INR: Prothrombin Time: 25.3 seconds — ABNORMAL HIGH (ref 11.6–15.2)

## 2011-05-26 NOTE — Discharge Summary (Signed)
HOSPITAL DISCHARGE SUMMARY  DATE OF ADMISSION:        05/24/2011  DATE OF DISCHARGE:       05/26/2011   BRIEF HOSPITAL SUMMARY: Pt is a 40 y.o. female with a PMHX of antiphospholipid syndrome and history of PE/DVT, on chronic coumadin therapy who presented to [facility] for evaluation and treatment of left sided, pleuritic chest pain that was more constant than her typical intermittent chest pain and right lower extremity pain (without change in her chronic right LE swelling). She went to [facility] and was admitted with concern of recurrent PE/ DVT. On admission, her INR was 1.9, which was subtherapeutic for the first time since Nov 2010, with her typical goal of 2.5-3.5 secondary to prior VTE despite prior therapeutic INR. CT angio was negative for PE, but notably was limited eval of subsegmental regions due to respiratory motion artifact. Doppler US was negative. CE were neg x3. Given her known hypercoaguable state, subtherapeutic INR, limited CTA, we decided to bridge her with Lovenox and she will followup within 2 days with Dr. Alexandria Lodge.   STUDIES PENDING AT TIME OF DISCHARGE: None.  OTHER FOLLOW-UP ISSUES: 1) Please ensure she was able to see Dr. Alexandria Lodge for coumadin management.   MEDICATIONS ON DISCHARGE: enoxaparin (LOVENOX) 150 MG/ML injection Inject 1.5 mg/kg into the skin daily.     acetaminophen (TYLENOL) 325 MG tablet Take 650 mg by mouth every 8 (eight) hours as needed.     cetirizine (ZYRTEC ALLERGY) 10 MG tablet Take 10 mg by mouth daily as needed.     fluticasone (FLONASE) 50 MCG/ACT nasal spray 1 spray by Nasal route daily.   sodium chloride (OCEAN) 0.65 % SOLN nasal spray 1 spray by Nasal route every 6 (six) hours.   spironolactone (ALDACTONE) 25 MG tablet Take 25 mg by mouth daily.     warfarin (COUMADIN) 7.5 MG tablet TAKE AS DIRECTED        Note was left in Dr Saralyn Pilar' inbasket after her last day. I am signing the note to complete the process but was not involved  in the pat's care.

## 2011-05-28 ENCOUNTER — Ambulatory Visit (INDEPENDENT_AMBULATORY_CARE_PROVIDER_SITE_OTHER): Payer: 59 | Admitting: Pharmacist

## 2011-05-28 DIAGNOSIS — Z7901 Long term (current) use of anticoagulants: Secondary | ICD-10-CM

## 2011-05-28 DIAGNOSIS — D6859 Other primary thrombophilia: Secondary | ICD-10-CM

## 2011-05-28 DIAGNOSIS — I2699 Other pulmonary embolism without acute cor pulmonale: Secondary | ICD-10-CM

## 2011-05-28 LAB — POCT INR: INR: 2.3

## 2011-05-28 NOTE — Progress Notes (Signed)
Anti-Coagulation Progress Note  [name] is a 40 y.o. female who is currently on an anti-coagulation regimen.    RECENT RESULTS: Recent results are below, the most recent result is correlated with a dose of 78.25 mg. per week AND concomitant Lovenox which will continue. Lab Results  Component Value Date   INR 2.3 05/28/2011   INR 2.26* 05/26/2011   INR 1.62* 05/25/2011    ANTI-COAG DOSE:   Latest dosing instructions   Total Sun Mon Tue Wed Thu Fri Sat   82.5 11.25 mg 11.25 mg 11.25 mg 11.25 mg 11.25 mg 15 mg 11.25 mg    (7.5 mg1.5) (7.5 mg1.5) (7.5 mg1.5) (7.5 mg1.5) (7.5 mg1.5) (7.5 mg2) (7.5 mg1.5)         ANTICOAG SUMMARY: Anticoagulation Episode Summary              Current INR goal 3.0-3.5 Next INR check 05/31/2011   INR from last check 2.3! (05/28/2011)     Goal at result date 2.5-3.5     Weekly max dose (mg)  Target end date Indefinite   Indications Pulmonary embolism and infarction, Long term current use of anticoagulant   INR check location [facility] Preferred lab    Send INR reminders to ANTICOAG IMP   Comments History of multiple episodes of VTED. Diagnosed with familial clotting disorder. Seen at [facility] for pulmonary hypertension. She should have a targeted INR range of 3.0 - 3.5. She will need to continue upon warfarin therapy indefinitely.            ANTICOAG TODAY: Anticoagulation Summary as of 05/28/2011              INR goal 3.0-3.5 Prior goal 2.5-3.5   Selected INR 2.3! (05/28/2011) Next INR check 05/31/2011   Weekly max dose (mg)  Target end date Indefinite   Indications Pulmonary embolism and infarction, Long term current use of anticoagulant    Anticoagulation Episode Summary              INR check location [facility] Preferred lab    Send INR reminders to ANTICOAG IMP   Comments History of multiple episodes of VTED. Diagnosed with familial clotting disorder. Seen at [facility] for pulmonary hypertension. She should have a  targeted INR range of 3.0 - 3.5. She will need to continue upon warfarin therapy indefinitely.            PATIENT INSTRUCTIONS: Patient Instructions  Patient instructed to take medications as defined in the Anti-coagulation Track section of this encounter.  Patient instructed to TAKE today's dose. Give today's dose of Lovenox. Patient verbalized understanding of these instructions.        FOLLOW-UP Return in 3 days (on 05/31/2011) for Follow up INR.  Hulen Luster, III Pharm.D., CACP

## 2011-05-28 NOTE — Patient Instructions (Signed)
Patient instructed to take medications as defined in the Anti-coagulation Track section of this encounter.  Patient instructed to TAKE today's dose. Give today's dose of Lovenox. Patient verbalized understanding of these instructions.

## 2011-05-31 ENCOUNTER — Ambulatory Visit (INDEPENDENT_AMBULATORY_CARE_PROVIDER_SITE_OTHER): Payer: 59 | Admitting: Pharmacist

## 2011-05-31 ENCOUNTER — Other Ambulatory Visit: Payer: Self-pay | Admitting: *Deleted

## 2011-05-31 DIAGNOSIS — D6859 Other primary thrombophilia: Secondary | ICD-10-CM

## 2011-05-31 DIAGNOSIS — I2699 Other pulmonary embolism without acute cor pulmonale: Secondary | ICD-10-CM

## 2011-05-31 DIAGNOSIS — Z7901 Long term (current) use of anticoagulants: Secondary | ICD-10-CM

## 2011-05-31 LAB — POCT INR: INR: 2.4

## 2011-05-31 NOTE — Progress Notes (Signed)
Anti-Coagulation Progress Note  [name] is a 40 y.o. female who is currently on an anti-coagulation regimen.    RECENT RESULTS: Recent results are below, the most recent result is correlated with a dose of 86.25mg . per week: Lab Results  Component Value Date   INR 2.40 05/31/2011   INR 2.3 05/28/2011   INR 2.26* 05/26/2011    ANTI-COAG DOSE:   Latest dosing instructions   Total Sun Mon Tue Wed Thu Fri Sat   93.75 11.25 mg 15 mg 15 mg 15 mg 11.25 mg 15 mg 11.25 mg    (7.5 mg1.5) (7.5 mg2) (7.5 mg2) (7.5 mg2) (7.5 mg1.5) (7.5 mg2) (7.5 mg1.5)         ANTICOAG SUMMARY: Anticoagulation Episode Summary              Current INR goal 3.0-3.5 Next INR check 06/14/2011   INR from last check 2.40! (05/31/2011)     Weekly max dose (mg)  Target end date Indefinite   Indications Pulmonary embolism and infarction, Long term current use of anticoagulant   INR check location [facility] Preferred lab    Send INR reminders to ANTICOAG IMP   Comments History of multiple episodes of VTED. Diagnosed with familial clotting disorder. Seen at [facility] for pulmonary hypertension. She should have a targeted INR range of 3.0 - 3.5. She will need to continue upon warfarin therapy indefinitely.            ANTICOAG TODAY: Anticoagulation Summary as of 05/31/2011              INR goal 3.0-3.5     Selected INR 2.40! (05/31/2011) Next INR check 06/14/2011   Weekly max dose (mg)  Target end date Indefinite   Indications Pulmonary embolism and infarction, Long term current use of anticoagulant    Anticoagulation Episode Summary              INR check location [facility] Preferred lab    Send INR reminders to ANTICOAG IMP   Comments History of multiple episodes of VTED. Diagnosed with familial clotting disorder. Seen at [facility] for pulmonary hypertension. She should have a targeted INR range of 3.0 - 3.5. She will need to continue upon warfarin therapy indefinitely.             PATIENT INSTRUCTIONS: Patient Instructions  Patient instructed to take medications as defined in the Anti-coagulation Track section of this encounter.  Patient instructed to take today's dose.  Patient verbalized understanding of these instructions.        FOLLOW-UP Return for Follow up INR.  Hulen Luster, III Pharm.D., CACP

## 2011-05-31 NOTE — Discharge Summary (Signed)
[name], [name] NO.:  [id number]  MEDICAL RECORD NO.:  [id number]  LOCATION:  3715                         FACILITY:  [facility]  PHYSICIAN:  Cliffton Asters, M.D.    DATE OF BIRTH:  [birth year]  DATE OF ADMISSION:  05/24/2011 DATE OF DISCHARGE:  05/26/2011                              DISCHARGE SUMMARY   DISCHARGE DIAGNOSES: 1. Sickle cell trait 2. Chest pain - unclear etiology, possible recurrent small pulmonary     embolism not ruled out despite no large embolus noted on CT angio,     given respiratory artifact not allowing for optimal visualization     of subsegmental regions.  Musculoskeletal chest pain also     considered given reproducibility of pain.  The patient was     subtherapeutic of Coumadin on admission with a goal of 2.5-3.5, and     admission INR of 1.9, to be bridged with Lovenox. 3. Right lower extremity swelling and pain - negative Dopplers,     therefore acute DVT ruled out.  Of note, the patient has chronic     lower extremity swelling, right greater than left. 4. Antiphospholipid antibody syndrome. 5. Severe pulmonary arterial hypertension with evidence of cor     pulmonale - mean PA pressure of 51.  Her right heart     catheterization in May, 2007.  DISCHARGE MEDICATIONS: 1. Lovenox 130 mg to be injected daily for 3 additional days. 2. Coumadin 11.25 mg to be taken on October 24, October 25, then as     directed by Dr. Alexandria Lodge in the [facility] on October 26. 3. Flonase 50 mcg per ACT nasal spray 1 spray nasally daily as needed. 4. Saline nasal spray, 1 spray nasally every 6 hours as needed. 5. Cetirizine 10 mg 1 tablet by mouth daily as needed. 6. Spironolactone 25 mg by mouth daily. 7. Tylenol 325 mg 2 tablets by mouth every 8 hours as needed for pain.  DISPOSITION AND FOLLOWUP:  The patient is to follow up with Dr. Chancy Milroy at the [facility] on May 28, 2011 at 9:30 am at which time PT/INR will be checked with  adjustment of Coumadin therapy as indicated with a goal INR of 3-3.5.  At this time, it should also be determined if the patient should continue her Lovenox for May 28, 2011, with consideration of discontinuation if Coumadin is therapeutic at that time.  The patient is also to follow up with Dr. Allena Katz at the [facility] on June 16, 2011, at 1:15 p.m. at which time, it should be determined if the patient was able to maintain her Coumadin Clinic followup, as well as for followup of her chronic medical conditions.  PROCEDURES PERFORMED: 1. CT angiogram chest - May 24, 2011 - no central pulmonary     embolus.  Respiratory motion precludes evaluation of subsegmental     vessels.  No acute abnormality.  Median sternotomy for PDA closure.     Pulmonary arterial hypertension. 2. Bilateral lower extremity Dopplers-May 25, 2011 - no evidence     of DVT or superficial thrombus involving the left lower extremity     and right common femoral vein.  No evidence of Baker cyst on the     right or left.  CONSULTATIONS:  NONE.  HISTORY AND PHYSICAL:  The patient is a 40 year old female with a past medical history of antiphospholipid antibody syndrome and multiple DVTs and PEs while on Coumadin therapy within a therapeutic range, and with IVC filter placement, severe pulmonary hypertension who was followed by a pulmonologist at [facility] who presented to [facility] for evaluation of chest pain and leg edema.  The patient notes the symptoms have been ongoing for 2 days prior to admission.  The chest pain was acute in onset and started when the patient was in a seated position.  She noted the pain to be sharp in nature shooting from her left chest wall migrating to her back, worse with deep inspiration and climbing up stairs.  The patient notes her chest pain to be similar to prior chest pain episodes, but the acuity and the  intensity of the pain was distinctly different from prior episodes.  She otherwise denied any associated shortness of breath or syncope or hemoptysis.  The patient also notes some worsening right lower extremity edema and significant tenderness around the calf area, which is notably different from her chronic right lower extremity edema. Secondary to this pain and swelling, the patient had indicated difficulty with ambulation.  Notably, the patient has previously had clots in the right lower extremity and she noted these symptoms to be similar to her prior DVT.  The patient otherwise indicates that she has been compliant with all of her medications including her Coumadin, has not started any new medications including antibiotics prior to admission.  She also denies any recent changes in dietary intake. Otherwise, she also denied fevers, chills or other complaints.  PHYSICAL EXAM ON ADMISSION:  VITAL SIGNS:  Blood pressure 115/74, respirations 12, pulse 80, temperature 97.6. GENERAL:  No acute distress.  Alert and oriented x3. CARDIOVASCULAR:  Regular rate and rhythm.  No murmurs, rubs, or gallops. Significant tenderness to palpation of the left chest. LUNGS:  Clear to auscultation bilaterally.  No wheezes, rales, or rhonchi. ABDOMEN:  Soft, nontender, nondistended.  Normoactive bowel sounds. EXTREMITY:  Right lower extremity edema with tenderness to palpation of calf, no erythema or warmth of the skin noted.  Decreased pulses noted.  LABS ON ADMISSION:  INR 1.9.  HOSPITAL COURSE BY PROBLEM: 1. Chest pain - given the patient's description of her pleuritic chest     pain, physical exam findings concerning for DVT in the right lower     extremity, subtherapeutic INR.  There was significant concern for     PE at the time of admission.  Secondary to this concern, a CT     angiogram of the chest was ordered and indicated no central     pulmonary embolus.  However, notably respiratory  motion precluded     evaluation of the subsegmental vessels.  The patient on admission     was also subtherapeutic of her Coumadin, with prior INR goal of 2.5-     3.5.  Upon further discussion with Dr. Alexandria Lodge who chronically has     been following the patient for her Coumadin therapy, it seems that     she has not previously been subtherapeutic of her Coumadin since at     least November 2010.  Lower extremity Dopplers were also ordered     and were negative for acute DVT, however, there is concern that if  initial DVTs were present it may have already embolized to the     lungs prior to evaluation, therefore, acute DVT cannot be     completely ruled out.  The patient was resumed of her Coumadin     therapy with an escalation of therapy to 11.25 mg daily.  As well     she was started on a Lovenox bridge, and is on day 3 of her bridge     at the time of discharge.  She will continue until follow up with     Dr. Alexandria Lodge on October 26.  The patient was provided counseling     regarding dietary restrictions while on Coumadin therapy and long-     term compliance with Coumadin.  The specific reason for     subtherapeutic Coumadin level could not be determined that the     patient had previously indicated no recent changes in medications     or dietary habits.  This will be followed up as an outpatient. 2. Right lower extremity swelling and pain - the patient's lower     extremity swelling and pain beyond her baseline was concerning for     acute DVT, lower extremity Dopplers were ordered and were negative     first set.  However, if the DVT had embolized by that time, we may     not have captured the DVT of specific point of time that we perform     testing.  Regardless, the patient was started on Lovenox bridge as     well as continued of her Coumadin.  The patient was also advised     for compression stockings which may assist in control of her lower     extremity edema. 3. Severe  pulmonary hypertension - mean PA pressure of 51 of her right     heart catheterization in May 2007.  The patient was continued of     her Aldactone during the hospital course.  DISCHARGE VITAL SIGNS:  Temperature 97.5, pulse 78, respirations 18, blood pressure 106/73, O2 saturation 96% on room air.  DISCHARGE LABS:  PT/INR 25.3-2.26.  CBC:  WBC 4.3, hemoglobin 13.9, hematocrit 39, platelets 231.    ______________________________ Johnette Abraham, DO   ______________________________ Cliffton Asters, M.D.    SK/MEDQ  D:  05/26/2011  T:  05/26/2011  Job:  [id number]  cc:   Chancy Milroy, MD  Electronically Signed by Johnette Abraham DO on 05/28/2011 10:47:00 PM Electronically Signed by Cliffton Asters M.D. on 05/31/2011 03:20:31 PM

## 2011-05-31 NOTE — Patient Instructions (Signed)
Patient instructed to take medications as defined in the Anti-coagulation Track section of this encounter.  Patient instructed to take today's dose.  Patient verbalized understanding of these instructions.    

## 2011-06-04 MED ORDER — SPIRONOLACTONE 25 MG PO TABS: 25.0000 mg | ORAL_TABLET | Freq: Every day | ORAL | Status: DC

## 2011-06-07 ENCOUNTER — Ambulatory Visit: Payer: 59

## 2011-06-14 ENCOUNTER — Ambulatory Visit (INDEPENDENT_AMBULATORY_CARE_PROVIDER_SITE_OTHER): Payer: 59 | Admitting: Pharmacist

## 2011-06-14 DIAGNOSIS — Z7901 Long term (current) use of anticoagulants: Secondary | ICD-10-CM

## 2011-06-14 DIAGNOSIS — D6859 Other primary thrombophilia: Secondary | ICD-10-CM

## 2011-06-14 DIAGNOSIS — I2699 Other pulmonary embolism without acute cor pulmonale: Secondary | ICD-10-CM

## 2011-06-14 NOTE — Patient Instructions (Signed)
Patient instructed to take medications as defined in the Anti-coagulation Track section of this encounter.  Patient instructed to OMIT today's dose.  Patient verbalized understanding of these instructions.    

## 2011-06-14 NOTE — Progress Notes (Signed)
Anti-Coagulation Progress Note  [name] is a 40 y.o. female who is currently on an anti-coagulation regimen.    RECENT RESULTS: Recent results are below, the most recent result is correlated with a dose of 93.75 mg. per week: Lab Results  Component Value Date   INR 4.50 06/14/2011   INR 2.40 05/31/2011   INR 2.3 05/28/2011    ANTI-COAG DOSE:   Latest dosing instructions   Total Sun Mon Tue Wed Thu Fri Sat   86.25 11.25 mg 15 mg 11.25 mg 11.25 mg 11.25 mg 15 mg 11.25 mg    (7.5 mg1.5) (7.5 mg2) (7.5 mg1.5) (7.5 mg1.5) (7.5 mg1.5) (7.5 mg2) (7.5 mg1.5)         ANTICOAG SUMMARY: Anticoagulation Episode Summary              Current INR goal 3.0-3.5 Next INR check 06/28/2011   INR from last check 2.40! (05/31/2011) Most recent INR 4.50! (06/14/2011)   Weekly max dose (mg)  Target end date Indefinite   Indications Pulmonary embolism and infarction, Long term current use of anticoagulant   INR check location [facility] Preferred lab    Send INR reminders to ANTICOAG IMP   Comments History of multiple episodes of VTED. Diagnosed with familial clotting disorder. Seen at [facility] for pulmonary hypertension. She should have a targeted INR range of 3.0 - 3.5. She will need to continue upon warfarin therapy indefinitely.            ANTICOAG TODAY: Anticoagulation Summary as of 06/14/2011              INR goal 3.0-3.5     Selected INR  Next INR check 06/28/2011   Weekly max dose (mg)  Target end date Indefinite   Indications Pulmonary embolism and infarction, Long term current use of anticoagulant    Anticoagulation Episode Summary              INR check location [facility] Preferred lab    Send INR reminders to ANTICOAG [facility]   Comments History of multiple episodes of VTED. Diagnosed with familial clotting disorder. Seen at [facility] for pulmonary hypertension. She should have a targeted INR range of 3.0 - 3.5. She will need to continue upon warfarin therapy  indefinitely.            PATIENT INSTRUCTIONS: Patient Instructions  Patient instructed to take medications as defined in the Anti-coagulation Track section of this encounter.  Patient instructed to OMIT today's dose.  Patient verbalized understanding of these instructions.        FOLLOW-UP Return in 2 weeks (on 06/28/2011) for Follow up INR.  Hulen Luster, III Pharm.D., CACP

## 2011-06-16 ENCOUNTER — Encounter: Payer: Self-pay | Admitting: Internal Medicine

## 2011-06-16 ENCOUNTER — Ambulatory Visit (INDEPENDENT_AMBULATORY_CARE_PROVIDER_SITE_OTHER): Payer: 59 | Admitting: Internal Medicine

## 2011-06-16 DIAGNOSIS — D6861 Antiphospholipid syndrome: Secondary | ICD-10-CM

## 2011-06-16 DIAGNOSIS — M7989 Other specified soft tissue disorders: Secondary | ICD-10-CM

## 2011-06-16 DIAGNOSIS — D6859 Other primary thrombophilia: Secondary | ICD-10-CM

## 2011-06-16 DIAGNOSIS — I2699 Other pulmonary embolism without acute cor pulmonale: Secondary | ICD-10-CM
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
## 2011-06-16 NOTE — Assessment & Plan Note (Signed)
Lifelong Coumadin- Continue as per Dr. Alexandria Lodge

## 2011-06-16 NOTE — Progress Notes (Signed)
  Subjective:    Patient ID: Kian Gamarra, female    DOB: 09-Jun-1971, 40 y.o.   MRN: [id number]  HPI Ms. Mazzocco is a pleasant 40 year old woman with past with history significant for antiphospholipid syndrome, recurrent DVT and PE who comes to the clinic for regular followup. She was admitted to [facility] from 05/24/2011 to 05/26/2011 for question of PE secondary to chest pain which was ruled out and she was not found to have any DVT also. She denies any severe chest pain today and feels better than before. She does complain of mild global headache which is chronic and she gets it every morning when she wakes up. Usually the headache goes away by itself but sometimes she has to take Tylenol which does help. For last 3 days her headache is continuous, all over the head, pressure-like, increases with movement of head. She has not taken Tylenol since the headache started this time. She is worried about side effects due to multiple medications. Otherwise she denies any fever, chills, nausea, vomiting, abdominal pain, short of breath, vision changes, diarrhea.    Review of Systems      As per history of present illness, all other systems reviewed and negative.  Objective:   Physical Exam  Vitals: Reviewed and stable General:  NAD HEENT: PERRL, EOMI, no scleral icterus Cardiac: S1, S2, RRR, no rubs, murmurs or gallops Pulm: clear to auscultation bilaterally, moving normal volumes of air Abd: soft, nontender, nondistended, BS present Ext: warm and well perfused, trace pedal edema on right lower extremity up to the knees and mild tenderness to palpation without any redness. Neuro: alert and oriented X3, cranial nerves II-XII grossly intact, strength and sensation to light touch equal in bilateral upper and lower extremities       Assessment & Plan:

## 2011-06-16 NOTE — Patient Instructions (Signed)
Please make followup appointment in 6-8 months. Call clinic if needed early appointment or for any refills. Please take all your medications regularly and followup with Dr. Alexandria Lodge for INR check.

## 2011-06-16 NOTE — Assessment & Plan Note (Signed)
Continue Coumadin per Dr. Alexandria Lodge.- likely lifelong.

## 2011-06-28 ENCOUNTER — Ambulatory Visit (INDEPENDENT_AMBULATORY_CARE_PROVIDER_SITE_OTHER): Payer: 59 | Admitting: Pharmacist

## 2011-06-28 ENCOUNTER — Encounter: Payer: Self-pay | Admitting: Internal Medicine

## 2011-06-28 DIAGNOSIS — D6859 Other primary thrombophilia: Secondary | ICD-10-CM

## 2011-06-28 DIAGNOSIS — I2699 Other pulmonary embolism without acute cor pulmonale: Secondary | ICD-10-CM

## 2011-06-28 DIAGNOSIS — Z7901 Long term (current) use of anticoagulants: Secondary | ICD-10-CM

## 2011-06-28 NOTE — Patient Instructions (Signed)
Patient instructed to take medications as defined in the Anti-coagulation Track section of this encounter.  Patient instructed to OMIT today's dose.  Patient verbalized understanding of these instructions.    

## 2011-06-28 NOTE — Progress Notes (Signed)
Anti-Coagulation Progress Note  [name] is a 40 y.o. female who is currently on an anti-coagulation regimen.    RECENT RESULTS: Recent results are below, the most recent result is correlated with a dose of 86.25 mg. per week: Lab Results  Component Value Date   INR 4.10 06/28/2011   INR 4.50 06/14/2011   INR 2.40 05/31/2011    ANTI-COAG DOSE:   Latest dosing instructions   Total Sun Mon Tue Wed Thu Fri Sat   78.75 11.25 mg 11.25 mg 11.25 mg 11.25 mg 11.25 mg 11.25 mg 11.25 mg    (7.5 mg1.5) (7.5 mg1.5) (7.5 mg1.5) (7.5 mg1.5) (7.5 mg1.5) (7.5 mg1.5) (7.5 mg1.5)         ANTICOAG SUMMARY: Anticoagulation Episode Summary              Current INR goal 3.0-3.5 Next INR check 07/19/2011   INR from last check 4.10! (06/28/2011)     Weekly max dose (mg)  Target end date Indefinite   Indications Pulmonary embolism and infarction, Long term current use of anticoagulant   INR check location [facility] Preferred lab    Send INR reminders to ANTICOAG IMP   Comments History of multiple episodes of VTED. Diagnosed with familial clotting disorder. Seen at [facility] for pulmonary hypertension. She should have a targeted INR range of 3.0 - 3.5. She will need to continue upon warfarin therapy indefinitely.            ANTICOAG TODAY: Anticoagulation Summary as of 06/28/2011              INR goal 3.0-3.5     Selected INR 4.10! (06/28/2011) Next INR check 07/19/2011   Weekly max dose (mg)  Target end date Indefinite   Indications Pulmonary embolism and infarction, Long term current use of anticoagulant    Anticoagulation Episode Summary              INR check location [facility] Preferred lab    Send INR reminders to ANTICOAG IMP   Comments History of multiple episodes of VTED. Diagnosed with familial clotting disorder. Seen at [facility] for pulmonary hypertension. She should have a targeted INR range of 3.0 - 3.5. She will need to continue upon warfarin therapy  indefinitely.            PATIENT INSTRUCTIONS: Patient Instructions  Patient instructed to take medications as defined in the Anti-coagulation Track section of this encounter.  Patient instructed to OMIT today's dose.  Patient verbalized understanding of these instructions.        FOLLOW-UP Return in 3 weeks (on 07/19/2011) for Follow up INR.  Hulen Luster, III Pharm.D., CACP

## 2011-07-19 ENCOUNTER — Ambulatory Visit (INDEPENDENT_AMBULATORY_CARE_PROVIDER_SITE_OTHER): Payer: 59 | Admitting: Pharmacist

## 2011-07-19 DIAGNOSIS — I2699 Other pulmonary embolism without acute cor pulmonale: Secondary | ICD-10-CM

## 2011-07-19 DIAGNOSIS — D6859 Other primary thrombophilia: Secondary | ICD-10-CM

## 2011-07-19 DIAGNOSIS — Z7901 Long term (current) use of anticoagulants: Secondary | ICD-10-CM

## 2011-07-19 LAB — POCT INR: INR: 2.9

## 2011-07-19 NOTE — Progress Notes (Signed)
Anti-Coagulation Progress Note  [name] is a 40 y.o. female who is currently on an anti-coagulation regimen.    RECENT RESULTS: Recent results are below, the most recent result is correlated with a dose of 78.75 mg. per week: Lab Results  Component Value Date   INR 2.90 07/19/2011   INR 4.10 06/28/2011   INR 4.50 06/14/2011    ANTI-COAG DOSE:   Latest dosing instructions   Total Sun Mon Tue Wed Thu Fri Sat   78.75 11.25 mg 11.25 mg 11.25 mg 11.25 mg 11.25 mg 11.25 mg 11.25 mg    (7.5 mg1.5) (7.5 mg1.5) (7.5 mg1.5) (7.5 mg1.5) (7.5 mg1.5) (7.5 mg1.5) (7.5 mg1.5)         ANTICOAG SUMMARY: Anticoagulation Episode Summary              Current INR goal 3.0-3.5 Next INR check 08/16/2011   INR from last check 2.90! (07/19/2011)     Weekly max dose (mg)  Target end date Indefinite   Indications Pulmonary embolism and infarction, Long term current use of anticoagulant   INR check location [facility] Preferred lab    Send INR reminders to ANTICOAG IMP   Comments History of multiple episodes of VTED. Diagnosed with familial clotting disorder. Seen at [facility] for pulmonary hypertension. She should have a targeted INR range of 3.0 - 3.5. She will need to continue upon warfarin therapy indefinitely.            ANTICOAG TODAY: Anticoagulation Summary as of 07/19/2011              INR goal 3.0-3.5     Selected INR 2.90! (07/19/2011) Next INR check 08/16/2011   Weekly max dose (mg)  Target end date Indefinite   Indications Pulmonary embolism and infarction, Long term current use of anticoagulant    Anticoagulation Episode Summary              INR check location [facility] Preferred lab    Send INR reminders to ANTICOAG IMP   Comments History of multiple episodes of VTED. Diagnosed with familial clotting disorder. Seen at [facility] for pulmonary hypertension. She should have a targeted INR range of 3.0 - 3.5. She will need to continue upon warfarin therapy  indefinitely.            PATIENT INSTRUCTIONS: Patient Instructions  Patient instructed to take medications as defined in the Anti-coagulation Track section of this encounter.  Patient instructed to take today's dose.  Patient verbalized understanding of these instructions.           FOLLOW-UP Return in 4 weeks (on 08/16/2011) for Follow up INR.  Hulen Luster, III Pharm.D., CACP

## 2011-07-19 NOTE — Patient Instructions (Addendum)
Patient instructed to take medications as defined in the Anti-coagulation Track section of this encounter.  Patient instructed to take today's dose.  Patient verbalized understanding of these instructions.    

## 2011-07-20 ENCOUNTER — Encounter: Payer: Self-pay | Admitting: Internal Medicine

## 2011-07-20 DIAGNOSIS — Z7901 Long term (current) use of anticoagulants: Secondary | ICD-10-CM

## 2011-08-16 ENCOUNTER — Ambulatory Visit (INDEPENDENT_AMBULATORY_CARE_PROVIDER_SITE_OTHER): Payer: 59 | Admitting: Pharmacist

## 2011-08-16 DIAGNOSIS — Z7901 Long term (current) use of anticoagulants: Secondary | ICD-10-CM

## 2011-08-16 DIAGNOSIS — D6859 Other primary thrombophilia: Secondary | ICD-10-CM

## 2011-08-16 DIAGNOSIS — I2699 Other pulmonary embolism without acute cor pulmonale: Secondary | ICD-10-CM

## 2011-08-16 LAB — POCT INR: INR: 2.1

## 2011-08-16 MED ORDER — WARFARIN SODIUM 7.5 MG PO TABS: ORAL_TABLET | ORAL | Status: DC

## 2011-08-16 NOTE — Progress Notes (Signed)
Anti-Coagulation Progress Note  [name] is a 41 y.o. female who is currently on an anti-coagulation regimen.    RECENT RESULTS: Recent results are below, the most recent result is correlated with a dose of 78.75 mg. per week: Lab Results  Component Value Date   INR 2.10 08/16/2011   INR 2.90 07/19/2011   INR 4.10 06/28/2011    ANTI-COAG DOSE:   Latest dosing instructions   Total Sun Mon Tue Wed Thu Fri Sat   86.25 11.25 mg 15 mg 11.25 mg 11.25 mg 15 mg 11.25 mg 11.25 mg    (7.5 mg1.5) (7.5 mg2) (7.5 mg1.5) (7.5 mg1.5) (7.5 mg2) (7.5 mg1.5) (7.5 mg1.5)         ANTICOAG SUMMARY: Anticoagulation Episode Summary              Current INR goal 3.0-3.5 Next INR check 09/06/2011   INR from last check 2.10! (08/16/2011)     Weekly max dose (mg)  Target end date Indefinite   Indications Pulmonary embolism and infarction, Long term current use of anticoagulant   INR check location [facility] Preferred lab    Send INR reminders to ANTICOAG IMP   Comments History of multiple episodes of VTED. Diagnosed with familial clotting disorder. Seen at [facility] for pulmonary hypertension. She should have a targeted INR range of 3.0 - 3.5. She will need to continue upon warfarin therapy indefinitely.            ANTICOAG TODAY: Anticoagulation Summary as of 08/16/2011              INR goal 3.0-3.5     Selected INR 2.10! (08/16/2011) Next INR check 09/06/2011   Weekly max dose (mg)  Target end date Indefinite   Indications Pulmonary embolism and infarction, Long term current use of anticoagulant    Anticoagulation Episode Summary              INR check location [facility] Preferred lab    Send INR reminders to ANTICOAG IMP   Comments History of multiple episodes of VTED. Diagnosed with familial clotting disorder. Seen at [facility] for pulmonary hypertension. She should have a targeted INR range of 3.0 - 3.5. She will need to continue upon warfarin therapy indefinitely.             PATIENT INSTRUCTIONS: Patient Instructions  Patient instructed to take medications as defined in the Anti-coagulation Track section of this encounter.  Patient instructed to take today's dose.  Patient verbalized understanding of these instructions.        FOLLOW-UP Return in 3 weeks (on 09/06/2011) for Follow up INR.  Hulen Luster, III Pharm.D., CACP

## 2011-08-16 NOTE — Patient Instructions (Signed)
Patient instructed to take medications as defined in the Anti-coagulation Track section of this encounter.  Patient instructed to take today's dose.  Patient verbalized understanding of these instructions.    

## 2011-09-06 ENCOUNTER — Ambulatory Visit (INDEPENDENT_AMBULATORY_CARE_PROVIDER_SITE_OTHER): Payer: 59 | Admitting: Pharmacist

## 2011-09-06 DIAGNOSIS — I2699 Other pulmonary embolism without acute cor pulmonale: Secondary | ICD-10-CM

## 2011-09-06 DIAGNOSIS — Z7901 Long term (current) use of anticoagulants: Secondary | ICD-10-CM

## 2011-09-06 NOTE — Progress Notes (Signed)
Anti-Coagulation Progress Note  [name] is a 41 y.o. female who is currently on an anti-coagulation regimen.    RECENT RESULTS: Recent results are below, the most recent result is correlated with a dose of 86.25 mg. per week: Lab Results  Component Value Date   INR 3.50 09/06/2011   INR 2.10 08/16/2011   INR 2.90 07/19/2011    ANTI-COAG DOSE:   Latest dosing instructions   Total Sun Mon Tue Wed Thu Fri Sat   82.5 11.25 mg 11.25 mg 11.25 mg 15 mg 11.25 mg 11.25 mg 11.25 mg    (7.5 mg1.5) (7.5 mg1.5) (7.5 mg1.5) (7.5 mg2) (7.5 mg1.5) (7.5 mg1.5) (7.5 mg1.5)         ANTICOAG SUMMARY: Anticoagulation Episode Summary              Current INR goal 3.0-3.5 Next INR check 09/27/2011   INR from last check 3.50 (09/06/2011)     Weekly max dose (mg)  Target end date Indefinite   Indications Pulmonary embolism and infarction, Long term current use of anticoagulant   INR check location [facility] Preferred lab    Send INR reminders to ANTICOAG IMP   Comments History of multiple episodes of VTED. Diagnosed with familial clotting disorder. Seen at [facility] for pulmonary hypertension. She should have a targeted INR range of 3.0 - 3.5. She will need to continue upon warfarin therapy indefinitely.            ANTICOAG TODAY: Anticoagulation Summary as of 09/06/2011              INR goal 3.0-3.5     Selected INR 3.50 (09/06/2011) Next INR check 09/27/2011   Weekly max dose (mg)  Target end date Indefinite   Indications Pulmonary embolism and infarction, Long term current use of anticoagulant    Anticoagulation Episode Summary              INR check location [facility] Preferred lab    Send INR reminders to ANTICOAG IMP   Comments History of multiple episodes of VTED. Diagnosed with familial clotting disorder. Seen at [facility] for pulmonary hypertension. She should have a targeted INR range of 3.0 - 3.5. She will need to continue upon warfarin therapy indefinitely.             PATIENT INSTRUCTIONS: Patient Instructions  Patient instructed to take medications as defined in the Anti-coagulation Track section of this encounter.  Patient instructed to take today's dose.  Patient verbalized understanding of these instructions.        FOLLOW-UP Return in 3 weeks (on 09/27/2011) for Follow up INR.  Hulen Luster, III Pharm.D., CACP

## 2011-09-06 NOTE — Patient Instructions (Signed)
Patient instructed to take medications as defined in the Anti-coagulation Track section of this encounter.  Patient instructed to take today's dose.  Patient verbalized understanding of these instructions.    

## 2011-09-27 ENCOUNTER — Ambulatory Visit (INDEPENDENT_AMBULATORY_CARE_PROVIDER_SITE_OTHER): Payer: 59 | Admitting: Pharmacist

## 2011-09-27 DIAGNOSIS — Z7901 Long term (current) use of anticoagulants: Secondary | ICD-10-CM

## 2011-09-27 DIAGNOSIS — I2699 Other pulmonary embolism without acute cor pulmonale: Secondary | ICD-10-CM

## 2011-09-27 LAB — POCT INR: INR: 4.1

## 2011-09-27 NOTE — Progress Notes (Signed)
Anti-Coagulation Progress Note  [name] is a 41 y.o. female who is currently on an anti-coagulation regimen.    RECENT RESULTS: Recent results are below, the most recent result is correlated with a dose of 82.5 mg. per week: Lab Results  Component Value Date   INR 4.10 09/27/2011   INR 3.50 09/06/2011   INR 2.10 08/16/2011    ANTI-COAG DOSE:   Latest dosing instructions   Total Sun Mon Tue Wed Thu Fri Sat   75 11.25 mg 7.5 mg 11.25 mg 11.25 mg 11.25 mg 11.25 mg 11.25 mg    (7.5 mg1.5) (7.5 mg1) (7.5 mg1.5) (7.5 mg1.5) (7.5 mg1.5) (7.5 mg1.5) (7.5 mg1.5)         ANTICOAG SUMMARY: Anticoagulation Episode Summary              Current INR goal 3.0-3.5 Next INR check 10/18/2011   INR from last check 4.10! (09/27/2011)     Weekly max dose (mg)  Target end date Indefinite   Indications Pulmonary embolism and infarction, Long term current use of anticoagulant   [facility]    Send INR reminders to ANTICOAG [facility]   Comments History of multiple episodes of VTED. Diagnosed with familial clotting disorder. Seen at [facility] for pulmonary hypertension. She should have a targeted INR range of 3.0 - 3.5. She will need to continue upon warfarin therapy indefinitely.            ANTICOAG TODAY: Anticoagulation Summary as of 09/27/2011              INR goal 3.0-3.5     Selected INR 4.10! (09/27/2011) Next INR check 10/18/2011   Weekly max dose (mg)  Target end date Indefinite   Indications Pulmonary embolism and infarction, Long term current use of anticoagulant    Anticoagulation Episode Summary              INR check [facility]    Send INR reminders to ANTICOAG IMP   Comments History of multiple episodes of VTED. Diagnosed with familial clotting disorder. Seen at [facility] for pulmonary hypertension. She should have a targeted INR range of 3.0 - 3.5. She will need to continue upon warfarin therapy indefinitely.             PATIENT INSTRUCTIONS: Patient Instructions  Patient instructed to take medications as defined in the Anti-coagulation Track section of this encounter.  Patient instructed to take today's dose.  Patient verbalized understanding of these instructions.        FOLLOW-UP Return in 3 weeks (on 10/18/2011) for Follow up INR.  Hulen Luster, III Pharm.D., CACP

## 2011-09-27 NOTE — Patient Instructions (Signed)
Patient instructed to take medications as defined in the Anti-coagulation Track section of this encounter.  Patient instructed to take today's dose.  Patient verbalized understanding of these instructions.    

## 2011-10-18 ENCOUNTER — Ambulatory Visit (INDEPENDENT_AMBULATORY_CARE_PROVIDER_SITE_OTHER): Payer: 59 | Admitting: Pharmacist

## 2011-10-18 DIAGNOSIS — I2699 Other pulmonary embolism without acute cor pulmonale: Secondary | ICD-10-CM

## 2011-10-18 DIAGNOSIS — Z7901 Long term (current) use of anticoagulants: Secondary | ICD-10-CM

## 2011-10-18 LAB — POCT INR: INR: 2.7

## 2011-10-18 NOTE — Progress Notes (Signed)
Anti-Coagulation Progress Note  [name] is a 41 y.o. female who is currently on an anti-coagulation regimen.    RECENT RESULTS: Recent results are below, the most recent result is correlated with a dose of 75 mg. per week: Lab Results  Component Value Date   INR 2.70 10/18/2011   INR 4.10 09/27/2011   INR 3.50 09/06/2011    ANTI-COAG DOSE:   Latest dosing instructions   Total Sun Mon Tue Wed Thu Fri Sat   75 11.25 mg 7.5 mg 11.25 mg 11.25 mg 11.25 mg 11.25 mg 11.25 mg    (7.5 mg1.5) (7.5 mg1) (7.5 mg1.5) (7.5 mg1.5) (7.5 mg1.5) (7.5 mg1.5) (7.5 mg1.5)         ANTICOAG SUMMARY: Anticoagulation Episode Summary              Current INR goal 3.0-3.5 Next INR check 11/15/2011   INR from last check 2.70! (10/18/2011)     Weekly max dose (mg)  Target end date Indefinite   Indications Pulmonary embolism and infarction, Long term current use of anticoagulant   [facility]    Send INR reminders to ANTICOAG IMP   Comments History of multiple episodes of VTED. Diagnosed with familial clotting disorder. Seen at [facility] for pulmonary hypertension. She should have a targeted INR range of 3.0 - 3.5. She will need to continue upon warfarin therapy indefinitely.            ANTICOAG TODAY: Anticoagulation Summary as of 10/18/2011              INR goal 3.0-3.5     Selected INR 2.70! (10/18/2011) Next INR check 11/15/2011   Weekly max dose (mg)  Target end date Indefinite   Indications Pulmonary embolism and infarction, Long term current use of anticoagulant    Anticoagulation Episode Summary              INR check [facility]    Send INR reminders to ANTICOAG IMP   Comments History of multiple episodes of VTED. Diagnosed with familial clotting disorder. Seen at [facility] for pulmonary hypertension. She should have a targeted INR range of 3.0 - 3.5. She will need to continue upon warfarin therapy indefinitely.             PATIENT INSTRUCTIONS: Patient Instructions  Patient instructed to take medications as defined in the Anti-coagulation Track section of this encounter.  Patient instructed to take today's dose.  Patient verbalized understanding of these instructions.        FOLLOW-UP Return in 4 weeks (on 11/15/2011) for Follow up INR.  Hulen Luster, III Pharm.D., CACP

## 2011-10-18 NOTE — Patient Instructions (Signed)
Patient instructed to take medications as defined in the Anti-coagulation Track section of this encounter.  Patient instructed to take today's dose.  Patient verbalized understanding of these instructions.    

## 2011-11-15 ENCOUNTER — Ambulatory Visit (INDEPENDENT_AMBULATORY_CARE_PROVIDER_SITE_OTHER): Payer: 59 | Admitting: Pharmacist

## 2011-11-15 DIAGNOSIS — Z7901 Long term (current) use of anticoagulants: Secondary | ICD-10-CM

## 2011-11-15 DIAGNOSIS — I2699 Other pulmonary embolism without acute cor pulmonale: Secondary | ICD-10-CM

## 2011-11-15 LAB — POCT INR: INR: 5.1

## 2011-11-15 NOTE — Progress Notes (Signed)
Anti-Coagulation Progress Note  [name] is a 41 y.o. female who is currently on an anti-coagulation regimen.    RECENT RESULTS: Recent results are below, the most recent result is correlated with a dose of 75 mg. per week: Lab Results  Component Value Date   INR 5.1 11/15/2011   INR 2.70 10/18/2011   INR 4.10 09/27/2011    ANTI-COAG DOSE:   Latest dosing instructions   Total Sun Mon Tue Wed Thu Fri Sat   67.5 11.25 mg 7.5 mg 11.25 mg 7.5 mg 11.25 mg 7.5 mg 11.25 mg    (7.5 mg1.5) (7.5 mg1) (7.5 mg1.5) (7.5 mg1) (7.5 mg1.5) (7.5 mg1) (7.5 mg1.5)         ANTICOAG SUMMARY: Anticoagulation Episode Summary              Current INR goal 3.0-3.5 Next INR check 11/29/2011   INR from last check 5.1! (11/15/2011)     Weekly max dose (mg)  Target end date Indefinite   Indications Pulmonary embolism and infarction, Long term current use of anticoagulant   INR check [facility]    Send INR reminders to ANTICOAG IMP   Comments History of multiple episodes of VTED. Diagnosed with familial clotting disorder. Seen at [facility] for pulmonary hypertension. She should have a targeted INR range of 3.0 - 3.5. She will need to continue upon warfarin therapy indefinitely.            ANTICOAG TODAY: Anticoagulation Summary as of 11/15/2011              INR goal 3.0-3.5     Selected INR 5.1! (11/15/2011) Next INR check 11/29/2011   Weekly max dose (mg)  Target end date Indefinite   Indications Pulmonary embolism and infarction, Long term current use of anticoagulant    Anticoagulation Episode Summary              INR check location [facility]    Send INR reminders to ANTICOAG IMP   Comments History of multiple episodes of VTED. Diagnosed with familial clotting disorder. Seen at [facility] for pulmonary hypertension. She should have a targeted INR range of 3.0 - 3.5. She will need to continue upon warfarin therapy indefinitely.             PATIENT INSTRUCTIONS: Patient Instructions  Patient instructed to take medications as defined in the Anti-coagulation Track section of this encounter.  Patient instructed to OMIT today's dose.  Patient verbalized understanding of these instructions.        FOLLOW-UP Return in 2 weeks (on 11/29/2011) for Follow up INR.  Hulen Luster, III Pharm.D., CACP

## 2011-11-15 NOTE — Patient Instructions (Signed)
Patient instructed to take medications as defined in the Anti-coagulation Track section of this encounter.  Patient instructed to OMIT today's dose.  Patient verbalized understanding of these instructions.    

## 2011-11-18 ENCOUNTER — Ambulatory Visit: Payer: 59

## 2011-11-29 ENCOUNTER — Ambulatory Visit (INDEPENDENT_AMBULATORY_CARE_PROVIDER_SITE_OTHER): Payer: 59 | Admitting: Pharmacist

## 2011-11-29 ENCOUNTER — Other Ambulatory Visit: Payer: Self-pay | Admitting: *Deleted

## 2011-11-29 DIAGNOSIS — Z7901 Long term (current) use of anticoagulants: Secondary | ICD-10-CM

## 2011-11-29 DIAGNOSIS — I2699 Other pulmonary embolism without acute cor pulmonale: Secondary | ICD-10-CM

## 2011-11-29 MED ORDER — SPIRONOLACTONE 25 MG PO TABS: 25.0000 mg | ORAL_TABLET | Freq: Every day | ORAL | Status: DC

## 2011-11-29 NOTE — Telephone Encounter (Signed)
Pt request # [phone] at a time.

## 2011-11-29 NOTE — Progress Notes (Signed)
Anti-Coagulation Progress Note  [name] is a 41 y.o. female who is currently on an anti-coagulation regimen.    RECENT RESULTS: Recent results are below, the most recent result is correlated with a dose of 67.5 mg. per week: Lab Results  Component Value Date   INR 2.80 11/29/2011   INR 5.1 11/15/2011   INR 2.70 10/18/2011    ANTI-COAG DOSE:   Latest dosing instructions   Total Sun Mon Tue Wed Thu Fri Sat   67.5 11.25 mg 7.5 mg 11.25 mg 7.5 mg 11.25 mg 7.5 mg 11.25 mg    (7.5 mg1.5) (7.5 mg1) (7.5 mg1.5) (7.5 mg1) (7.5 mg1.5) (7.5 mg1) (7.5 mg1.5)         ANTICOAG SUMMARY: Anticoagulation Episode Summary              Current INR goal 3.0-3.5 Next INR check 01/03/2012   INR from last check 2.80! (11/29/2011)     Weekly max dose (mg)  Target end date Indefinite   Indications Pulmonary embolism and infarction, Long term current use of anticoagulant   INR check location [facility]    Send INR reminders to ANTICOAG IMP   Comments History of multiple episodes of VTED. Diagnosed with familial clotting disorder. Seen at [facility] for pulmonary hypertension. She should have a targeted INR range of 3.0 - 3.5. She will need to continue upon warfarin therapy indefinitely.            ANTICOAG TODAY: Anticoagulation Summary as of 11/29/2011              INR goal 3.0-3.5     Selected INR 2.80! (11/29/2011) Next INR check 01/03/2012   Weekly max dose (mg)  Target end date Indefinite   Indications Pulmonary embolism and infarction, Long term current use of anticoagulant    Anticoagulation Episode Summary              INR check location [facility]    Send INR reminders to ANTICOAG IMP   Comments History of multiple episodes of VTED. Diagnosed with familial clotting disorder. Seen at [facility] for pulmonary hypertension. She should have a targeted INR range of 3.0 - 3.5. She will need to continue upon warfarin therapy indefinitely.             PATIENT INSTRUCTIONS: Patient Instructions  Patient instructed to take medications as defined in the Anti-coagulation Track section of this encounter.  Patient instructed to take today's dose.  Patient verbalized understanding of these instructions.        FOLLOW-UP Return in 5 weeks (on 01/03/2012) for Follow up INR.  Hulen Luster, III Pharm.D., CACP

## 2011-11-29 NOTE — Patient Instructions (Signed)
Patient instructed to take medications as defined in the Anti-coagulation Track section of this encounter.  Patient instructed to take today's dose.  Patient verbalized understanding of these instructions.    

## 2012-01-03 ENCOUNTER — Ambulatory Visit (INDEPENDENT_AMBULATORY_CARE_PROVIDER_SITE_OTHER): Payer: 59 | Admitting: Pharmacist

## 2012-01-03 DIAGNOSIS — I2699 Other pulmonary embolism without acute cor pulmonale: Secondary | ICD-10-CM

## 2012-01-03 DIAGNOSIS — Z7901 Long term (current) use of anticoagulants: Secondary | ICD-10-CM

## 2012-01-03 LAB — POCT INR: INR: 2.4

## 2012-01-03 NOTE — Progress Notes (Signed)
Anti-Coagulation Progress Note  [name] is a 41 y.o. female who is currently on an anti-coagulation regimen.    RECENT RESULTS: Recent results are below, the most recent result is correlated with a dose of 67.5 mg. per week: Lab Results  Component Value Date   INR 2.40 01/03/2012   INR 2.80 11/29/2011   INR 5.1 11/15/2011    ANTI-COAG DOSE:   Latest dosing instructions   Total Sun Mon Tue Wed Thu Fri Sat   71.25 11.25 mg 11.25 mg 11.25 mg 7.5 mg 11.25 mg 7.5 mg 11.25 mg    (7.5 mg1.5) (7.5 mg1.5) (7.5 mg1.5) (7.5 mg1) (7.5 mg1.5) (7.5 mg1) (7.5 mg1.5)         ANTICOAG SUMMARY: Anticoagulation Episode Summary              Current INR goal 3.0-3.5 Next INR check 01/31/2012   INR from last check 2.40! (01/03/2012)     Weekly max dose (mg)  Target end date Indefinite   Indications Pulmonary embolism and infarction, Long term current use of anticoagulant   INR check location [facility]    Send INR reminders to ANTICOAG IMP   Comments History of multiple episodes of VTED. Diagnosed with familial clotting disorder. Seen at [facility] for pulmonary hypertension. She should have a targeted INR range of 3.0 - 3.5. She will need to continue upon warfarin therapy indefinitely.            ANTICOAG TODAY: Anticoagulation Summary as of 01/03/2012              INR goal 3.0-3.5     Selected INR 2.40! (01/03/2012) Next INR check 01/31/2012   Weekly max dose (mg)  Target end date Indefinite   Indications Pulmonary embolism and infarction, Long term current use of anticoagulant    Anticoagulation Episode Summary              INR check location [facility]    Send INR reminders to ANTICOAG IMP   Comments History of multiple episodes of VTED. Diagnosed with familial clotting disorder. Seen at [facility] for pulmonary hypertension. She should have a targeted INR range of 3.0 - 3.5. She will need to continue upon warfarin therapy indefinitely.             PATIENT INSTRUCTIONS: Patient Instructions  Patient instructed to take medications as defined in the Anti-coagulation Track section of this encounter.  Patient instructed to take today's dose.  Patient verbalized understanding of these instructions.        FOLLOW-UP Return in 4 weeks (on 01/31/2012) for Follow up INR at 1100H.  Hulen Luster, III Pharm.D., CACP

## 2012-01-03 NOTE — Patient Instructions (Signed)
Patient instructed to take medications as defined in the Anti-coagulation Track section of this encounter.  Patient instructed to take today's dose.  Patient verbalized understanding of these instructions.    

## 2012-01-12 ENCOUNTER — Ambulatory Visit (INDEPENDENT_AMBULATORY_CARE_PROVIDER_SITE_OTHER): Payer: 59 | Admitting: Obstetrics and Gynecology

## 2012-01-12 ENCOUNTER — Encounter: Payer: Self-pay | Admitting: Obstetrics and Gynecology

## 2012-01-12 VITALS — BP 120/84 | HR 93 | Temp 97.7°F | Ht 64.0 in | Wt 192.8 lb

## 2012-01-12 DIAGNOSIS — Z01419 Encounter for gynecological examination (general) (routine) without abnormal findings: Secondary | ICD-10-CM

## 2012-01-12 DIAGNOSIS — Z7251 High risk heterosexual behavior: Secondary | ICD-10-CM

## 2012-01-12 DIAGNOSIS — Z3009 Encounter for other general counseling and advice on contraception: Secondary | ICD-10-CM

## 2012-01-12 NOTE — Progress Notes (Signed)
[name] [id number]  Chief Complaint  Patient presents with  . Gynecologic Exam  . Contraception    wants to discuss other options, IUD came out a year ago.       SUBJECTIVE  HPI: Condoms only. LMP 3 d ago. Menses regular. Past Medical History  Diagnosis Date  . Pulmonary embolism 2005    Massive  . Pulmonary hypertension     2/2 chronic PE. Sees Dr. Janee Morn, pulmonologist at [facility]  . Primary hypercoagulable state     No details known. Needs review  . Hepatitis B infection     Hep B core Ab 04/2005  . IUD     Mirena  . DVT (deep venous thrombosis)    Past Surgical History  Procedure Date  . Pulmonary artery endarterectomy   . Cardiac catheterization 11/2005    R heart cath : Severe pulmonary arterial HTN with evidence of cor pulmonale. Vasodilatory challenge not performed.   History   Social History  . Marital Status: Married    Spouse Name: N/A    Number of Children: N/A  . Years of Education: N/A   Occupational History  . Not on file.   Social History Main Topics  . Smoking status: Never Smoker   . Smokeless tobacco: Not on file  . Alcohol Use: Not on file  . Drug Use: No  . Sexually Active: Not on file   Other Topics Concern  . Not on file   Social History Narrative   Pt lives in [place], braids hair for a living.    Current Outpatient Prescriptions on File Prior to Visit  Medication Sig Dispense Refill  . acetaminophen (TYLENOL) 325 MG tablet Take 650 mg by mouth every 8 (eight) hours as needed.        . cetirizine (ZYRTEC ALLERGY) 10 MG tablet Take 10 mg by mouth daily as needed.        . sodium chloride (OCEAN) 0.65 % SOLN nasal spray 1 spray by Nasal route every 6 (six) hours.  90 mL  6  . spironolactone (ALDACTONE) 25 MG tablet Take 1 tablet (25 mg total) by mouth daily.  30 tablet  5  . warfarin (COUMADIN) 7.5 MG tablet Take as directed by anticoagulation clinic provider. Currently taking 1 and 1/2 tablets 5 days/wk; 2 tablets on 2  days/wk. Requests 90 day supply.  150 tablet  1   Allergies  Allergen Reactions  . Aspirin     ROS: Pertinent items in HPI  OBJECTIVE Blood pressure 120/84, pulse 93, temperature 97.7 F (36.5 C), temperature source Oral, height 5\' 4"  (1.626 m), weight 192 lb 12.8 oz (87.454 kg), last menstrual period 01/09/2012.  GENERAL: Well-developed, well-nourished female in no acute distress.  HEENT: Normocephalic, good dentition HEART: normal rate RESP: normal effort BREASTS ABDOMEN: Soft, nontender EXTREMITIES: Nontender, no edema NEURO: Alert and oriented SPECULUM EXAM: NEFG, physiologic discharge, no blood noted, cervix parous BIMANUAL: cervix closed, no CMT; uterus NSSP; no adnexal tenderness or masses   ASSESSMENT Annual GYN exam S/P PE on coumadin  PLAN Pap with GC CT done Counseled at length about her contraception options. She will return after abstaining from sex for 2 weeks for IUD or Depo-Provera     Kapil Petropoulos 01/12/2012 3:13 PM

## 2012-01-12 NOTE — Patient Instructions (Signed)
Pap Test A Pap test is a sampling of cells from a woman's cervix. The cervix is the opening between the vagina (birth canal) and the uterus (the bottom part of the womb). The cells are scraped from the cervix during a pelvic exam. These cells are then looked at under a microscope to see if the cells are normal or to see if a cancer is developing or there are changes that suggest a cancer will develop. Cervical dysplasia is a condition in which a woman has abnormal changes in the top layer of cells of her cervix. These changes are an early sign that cervical cancer may develop. Pap tests also look for the human papilloma virus (HPV) because it has 4 types that are responsible for 70% of cervical cancer. Infections can also be found during a Pap test such as bacteria, fungus, protozoa and viruses.  Cervical cancer is harder to treat and less likely to have a good outcome if left untreated. Catching the disease at an early stage leads to a better outcome. Since the Pap test was introduced 60 years ago, deaths from cervical cancer have decreased by 70%. Every woman should keep up to date with Pap tests. RISK FACTORS FOR CERVICAL CANCER INCLUDE:   Becoming sexually active before age 18.   Being the daughter of a woman who took diethylstilbestrol (DES) during pregnancy.   Having a sexual partner who has or has had cancer of the penis.   Having a sexual partner whose past partner had cervical cancer or cervical dysplasia (early cell changes which suggest a cancer may develop).   Having a weakened immune system. An example would be HIV or other immunodeficiency disorder.   Having had a sexually transmitted infection such as chlamydia, gonorrhea or HPV.   Having had an abnormal Pap or cancer of the vagina or vulva.   Having had more than one sexual partner.   A history of cervical cancer in a woman's sister or mother.   Not using condoms with new sexual partners.   Smoking.  WHO SHOULD HAVE PAP  TESTS  A Pap test is done to screen for cervical cancer.   The first Pap test should be done at age 21.   Between ages 21 and 29, Pap tests are repeated every 2 years.   Beginning at age 30, you are advised to have a Pap test every 3 years as long as your past 3 Pap tests have been normal.   Some women have medical problems that increase the chance of getting cervical cancer. Talk to your caregiver about these problems. It is especially important to talk to your caregiver if a new problem develops soon after your last Pap test. In these cases, your caregiver may recommend more frequent screening and Pap tests.   The above recommendations are the same for women who have or have not gotten the vaccine for HPV (Human Papillomavirus).   If you had a hysterectomy for a problem that was not a cancer or a condition that could lead to cancer, then you no longer need Pap tests. However, even if you no longer need a Pap test, a regular exam is a good idea to make sure no other problems are starting.    If you are between ages 65 and 70, and you have had normal Pap tests going back 10 years, you no longer need Pap tests. However, even if you no longer need a Pap test, a regular exam is a good idea   to make sure no other problems are starting.    If you have had past treatment for cervical cancer or a condition that could lead to cancer, you need Pap tests and screening for cancer for at least 20 years after your treatment.   If Pap tests have been discontinued, risk factors (such as a new sexual partner) need to be re-assessed to determine if screening should be resumed.   Some women may need screenings more often if they are at high risk for cervical cancer.  PREPARATION FOR A PAP TEST A Pap test should be performed during the weeks before the start of menstruation. Women should not douche or have sexual intercourse for 24 hours before the test. No vaginal creams, diaphragms, or tampons should be  used for 24 hours before the test. To minimize discomfort, a woman should empty her bladder just before the exam. TAKING THE PAP TEST The caregiver will perform a pelvic exam. A metal or plastic instrument (speculum) is placed in the vagina. This is done before your caregiver does a bimanual exam of your internal female organs. This instrument allows your caregiver to see the inside of the vagina and look at the cervix. A small, sterile brush is used to take a sample of cells from the internal opening of the cervix. A small wooden spatula is used to scrape the outside of the cervix. Neither of these two methods to collect cells will cause you pain. These two scrapings are placed on a glass slide or in a small bottle filled with a special liquid. The cells are looked at later under a microscope in a lab. A specialist will look at these cells and determine if the cells are normal. RESULTS OF YOUR PAP TEST  A healthy Pap test shows no abnormal cells or evidence of inflammation.   The presence of abnormally growing cells on the surface of the cervix may be reported as an abnormal Pap test. Different categories of findings are used to describe your Pap test. Your caregiver will go over the importance of these findings with you. The caregiver will then determine what follow-up is needed or when you should have your next pap test.   If you have had two or more abnormal Pap tests:   You may be asked to have a colposcopy. This is a test in which the cervix is viewed with a special lighted microscope.   A cervical tissue sample (biopsy) may also be needed. This involves taking a small tissue sample from the cervix. The sample is looked at under a microscope to find the cause of the abnormal cells. Make sure you find out the results of the Pap test. If you have not received the results within two weeks, contact your caregiver's office for the results. Do not assume everything is normal if you have not heard from  your caregiver or medical facility. It is important to follow up on all of your test results.  Document Released: 10/09/2002 Document Revised: 07/08/2011 Document Reviewed: 07/13/2011 [employer] Patient Information 2012 [employer]. 

## 2012-01-26 ENCOUNTER — Ambulatory Visit (INDEPENDENT_AMBULATORY_CARE_PROVIDER_SITE_OTHER): Payer: 59 | Admitting: General Practice

## 2012-01-26 VITALS — BP 115/79 | HR 91 | Temp 98.3°F | Ht 64.0 in | Wt 196.2 lb

## 2012-01-26 DIAGNOSIS — Z309 Encounter for contraceptive management, unspecified: Secondary | ICD-10-CM

## 2012-01-26 DIAGNOSIS — Z3049 Encounter for surveillance of other contraceptives: Secondary | ICD-10-CM

## 2012-01-26 MED ORDER — MEDROXYPROGESTERONE ACETATE 150 MG/ML IM SUSP
150.0000 mg | Freq: Once | INTRAMUSCULAR | Status: AC
  Administered 2012-01-26: 150 mg via INTRAMUSCULAR

## 2012-01-31 ENCOUNTER — Ambulatory Visit (INDEPENDENT_AMBULATORY_CARE_PROVIDER_SITE_OTHER): Payer: 59 | Admitting: Pharmacist

## 2012-01-31 DIAGNOSIS — Z7901 Long term (current) use of anticoagulants: Secondary | ICD-10-CM

## 2012-01-31 DIAGNOSIS — I2699 Other pulmonary embolism without acute cor pulmonale: Secondary | ICD-10-CM

## 2012-01-31 NOTE — Progress Notes (Signed)
Anti-Coagulation Progress Note  [name] is a 41 y.o. female who is currently on an anti-coagulation regimen.    RECENT RESULTS: Recent results are below, the most recent result is correlated with a dose of 71.25 mg. per week: Lab Results  Component Value Date   INR 3.0 01/31/2012   INR 2.40 01/03/2012   INR 2.80 11/29/2011    ANTI-COAG DOSE:   Latest dosing instructions   Total Sun Mon Tue Wed Thu Fri Sat   71.25 11.25 mg 11.25 mg 11.25 mg 7.5 mg 11.25 mg 7.5 mg 11.25 mg    (7.5 mg1.5) (7.5 mg1.5) (7.5 mg1.5) (7.5 mg1) (7.5 mg1.5) (7.5 mg1) (7.5 mg1.5)         ANTICOAG SUMMARY: Anticoagulation Episode Summary              Current INR goal 3.0-3.5 Next INR check 02/28/2012   INR from last check 3.0 (01/31/2012)     Weekly max dose (mg)  Target end date Indefinite   Indications Pulmonary embolism and infarction, Long term current use of anticoagulant   IN[facility]    Send INR reminders to ANTICOAG IMP   Comments History of multiple episodes of VTED. Diagnosed with familial clotting disorder. Seen at [facility] for pulmonary hypertension. She should have a targeted INR range of 3.0 - 3.5. She will need to continue upon warfarin therapy indefinitely.            ANTICOAG TODAY: Anticoagulation Summary as of 01/31/2012              INR goal 3.0-3.5     Selected INR 3.0 (01/31/2012) Next INR check 02/28/2012   Weekly max dose (mg)  Target end date Indefinite   Indications Pulmonary embolism and infarction, Long term current use of anticoagulant    Anticoagulation Episode Summary              INR check location [facility]    Send INR reminders to ANTICOAG IMP   Comments History of multiple episodes of VTED. Diagnosed with familial clotting disorder. Seen at [facility] for pulmonary hypertension. She should have a targeted INR range of 3.0 - 3.5. She will need to continue upon warfarin therapy indefinitely.             PATIENT INSTRUCTIONS: Patient Instructions  Patient instructed to take medications as defined in the Anti-coagulation Track section of this encounter.  Patient instructed to take today's dose.  Patient verbalized understanding of these instructions.        FOLLOW-UP Return in 4 weeks (on 02/28/2012) for Follow up INR at 1115h.  Hulen Luster, III Pharm.D., CACP

## 2012-01-31 NOTE — Patient Instructions (Signed)
Patient instructed to take medications as defined in the Anti-coagulation Track section of this encounter.  Patient instructed to take today's dose.  Patient verbalized understanding of these instructions.    

## 2012-02-28 ENCOUNTER — Emergency Department (HOSPITAL_COMMUNITY): Payer: 59

## 2012-02-28 ENCOUNTER — Encounter: Payer: Self-pay | Admitting: Internal Medicine

## 2012-02-28 ENCOUNTER — Ambulatory Visit (INDEPENDENT_AMBULATORY_CARE_PROVIDER_SITE_OTHER): Payer: 59 | Admitting: Internal Medicine

## 2012-02-28 ENCOUNTER — Other Ambulatory Visit: Payer: Self-pay

## 2012-02-28 ENCOUNTER — Emergency Department (HOSPITAL_COMMUNITY)
Admission: EM | Admit: 2012-02-28 | Discharge: 2012-02-28 | Disposition: A | Discharge status: Home / Self Care | Payer: 59 | Attending: Emergency Medicine | Admitting: Emergency Medicine

## 2012-02-28 ENCOUNTER — Ambulatory Visit (INDEPENDENT_AMBULATORY_CARE_PROVIDER_SITE_OTHER): Payer: 59 | Admitting: Pharmacist

## 2012-02-28 ENCOUNTER — Encounter (HOSPITAL_COMMUNITY): Payer: Self-pay | Admitting: Emergency Medicine

## 2012-02-28 VITALS — BP 122/85 | HR 87 | Temp 98.4°F | Ht 64.0 in | Wt 195.4 lb

## 2012-02-28 DIAGNOSIS — R42 Dizziness and giddiness: Secondary | ICD-10-CM

## 2012-02-28 DIAGNOSIS — I2699 Other pulmonary embolism without acute cor pulmonale: Secondary | ICD-10-CM

## 2012-02-28 DIAGNOSIS — J40 Bronchitis, not specified as acute or chronic: Secondary | ICD-10-CM

## 2012-02-28 DIAGNOSIS — M7989 Other specified soft tissue disorders: Secondary | ICD-10-CM

## 2012-02-28 DIAGNOSIS — Z86718 Personal history of other venous thrombosis and embolism: Secondary | ICD-10-CM | POA: Insufficient documentation

## 2012-02-28 DIAGNOSIS — E860 Dehydration: Secondary | ICD-10-CM

## 2012-02-28 DIAGNOSIS — Z7901 Long term (current) use of anticoagulants: Secondary | ICD-10-CM

## 2012-02-28 LAB — CBC WITH DIFFERENTIAL/PLATELET
Basophils Absolute: 0 10*3/uL (ref 0.0–0.1)
Basophils Relative: 0 % (ref 0–1)
Eosinophils Absolute: 0.1 K/uL (ref 0.0–0.7)
Eosinophils Relative: 1 % (ref 0–5)
HCT: 44.9 % (ref 36.0–46.0)
Hemoglobin: 16.2 g/dL — ABNORMAL HIGH (ref 12.0–15.0)
Lymphocytes Relative: 38 % (ref 12–46)
Lymphs Abs: 2.2 K/uL (ref 0.7–4.0)
MCH: 30.5 pg (ref 26.0–34.0)
MCHC: 36.1 g/dL — ABNORMAL HIGH (ref 30.0–36.0)
MCV: 84.6 fL (ref 78.0–100.0)
Monocytes Absolute: 0.8 K/uL (ref 0.1–1.0)
Monocytes Relative: 14 % — ABNORMAL HIGH (ref 3–12)
Neutro Abs: 2.6 10*3/uL (ref 1.7–7.7)
Neutrophils Relative %: 46 % (ref 43–77)
Platelets: 237 10*3/uL (ref 150–400)
RBC: 5.31 MIL/uL — ABNORMAL HIGH (ref 3.87–5.11)
RDW: 14.2 % (ref 11.5–15.5)
WBC: 5.7 10*3/uL (ref 4.0–10.5)

## 2012-02-28 LAB — PROTIME-INR
INR: 2.06 — ABNORMAL HIGH (ref 0.00–1.49)
Prothrombin Time: 23.6 seconds — ABNORMAL HIGH (ref 11.6–15.2)

## 2012-02-28 LAB — BASIC METABOLIC PANEL WITH GFR
CO2: 26 meq/L (ref 19–32)
Chloride: 103 meq/L (ref 96–112)
Creatinine, Ser: 0.95 mg/dL (ref 0.50–1.10)
Glucose, Bld: 91 mg/dL (ref 70–99)

## 2012-02-28 LAB — BASIC METABOLIC PANEL
BUN: 9 mg/dL (ref 6–23)
Calcium: 9.4 mg/dL (ref 8.4–10.5)
GFR calc Af Amer: 84 mL/min — ABNORMAL LOW (ref >90)
GFR calc non Af Amer: 72 mL/min — ABNORMAL LOW (ref >90)
Potassium: 4 mEq/L (ref 3.5–5.1)
Sodium: 138 mEq/L (ref 135–145)

## 2012-02-28 LAB — GLUCOSE, CAPILLARY: Glucose-Capillary: 170 mg/dL — ABNORMAL HIGH (ref 70–99)

## 2012-02-28 LAB — PRO B NATRIURETIC PEPTIDE: Pro B Natriuretic peptide (BNP): 85.4 pg/mL (ref 0–125)

## 2012-02-28 IMAGING — CR DG CHEST 2V
2 series · 2 of 2 positions shown · non-contrast
Comparison: [DATE]

CLINICAL DATA: Cough

CHEST - 2 VIEW

[w chest pa]
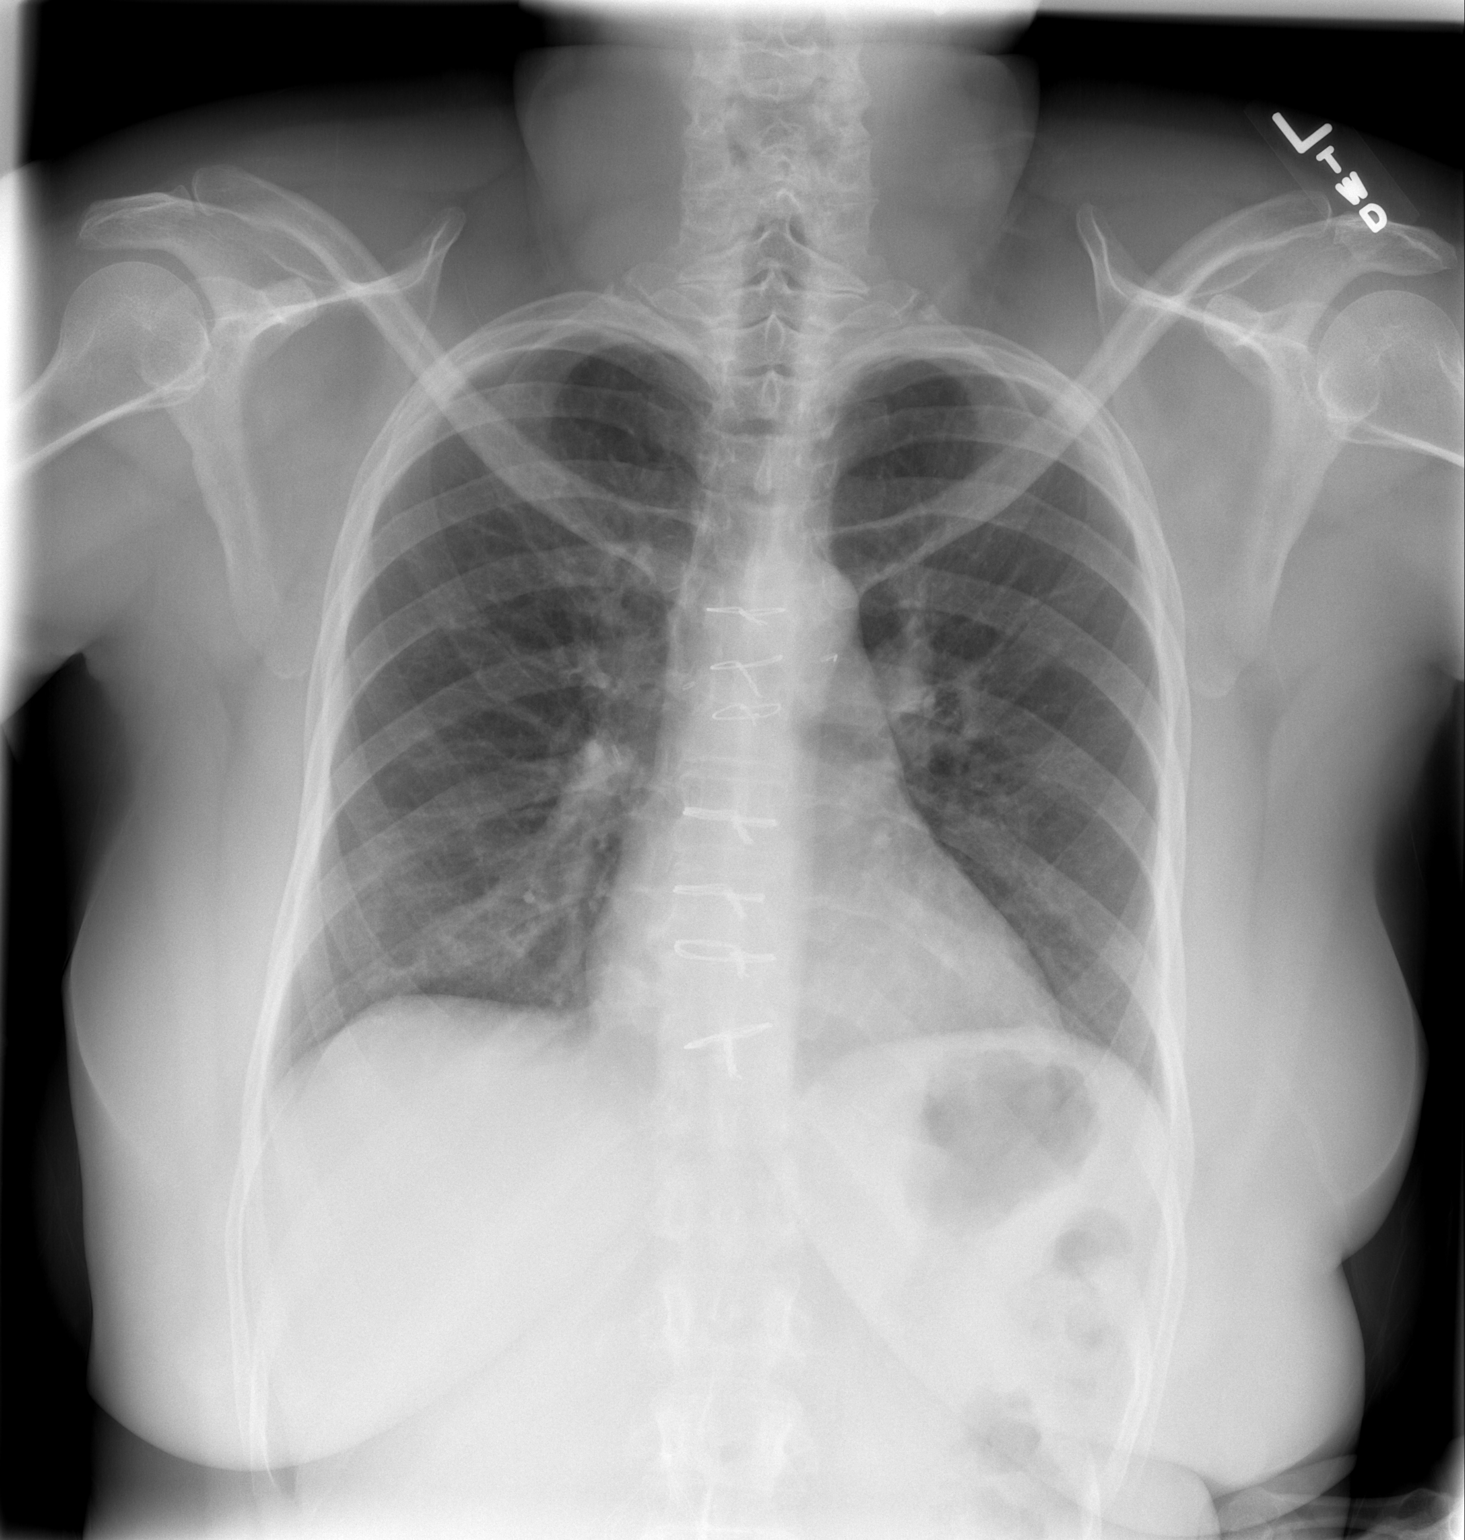

[w chest lat]
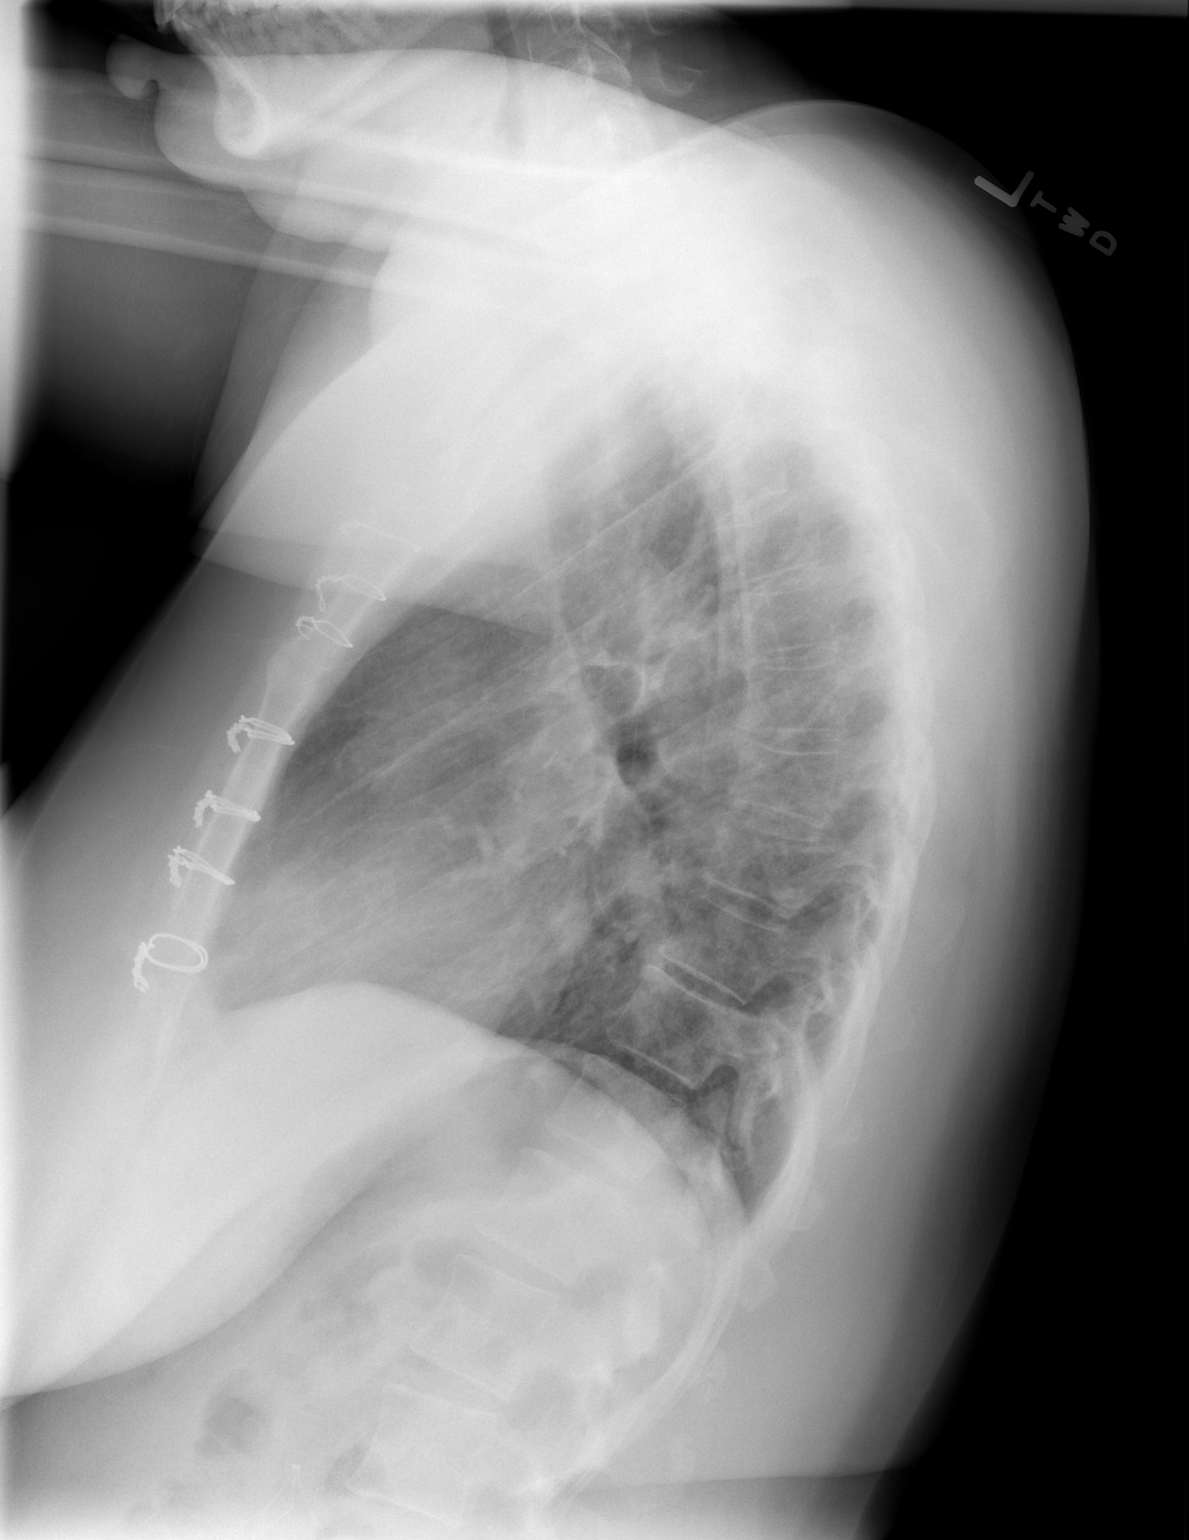

[2 of 2 positions shown; findings below may reference images not displayed]

FINDINGS: Mild cardiomegaly.  Clear lungs.  No pneumothorax and no
pleural effusion.  Stable thoracic spine.
IMPRESSION: Cardiomegaly without edema.

## 2012-02-28 MED ORDER — AEROCHAMBER PLUS W/MASK MISC
Status: AC
  Filled 2012-02-28: qty 1

## 2012-02-28 MED ORDER — SODIUM CHLORIDE 0.9 % IV BOLUS (SEPSIS)
1000.0000 mL | Freq: Once | INTRAVENOUS | Status: AC
  Administered 2012-02-28: 1000 mL via INTRAVENOUS

## 2012-02-28 MED ORDER — ALBUTEROL SULFATE HFA 108 (90 BASE) MCG/ACT IN AERS
1.0000 | INHALATION_SPRAY | Freq: Four times a day (QID) | RESPIRATORY_TRACT | Status: DC | PRN
  Filled 2012-02-28 (×2): qty 6.7

## 2012-02-28 MED ORDER — ALBUTEROL SULFATE (5 MG/ML) 0.5% IN NEBU
5.0000 mg | INHALATION_SOLUTION | Freq: Once | RESPIRATORY_TRACT | Status: AC
  Administered 2012-02-28: 5 mg via RESPIRATORY_TRACT
  Filled 2012-02-28: qty 1

## 2012-02-28 MED ORDER — ACETAMINOPHEN-CODEINE #3 300-30 MG PO TABS
2.0000 | ORAL_TABLET | Freq: Once | ORAL | Status: AC
  Administered 2012-02-28: 2 via ORAL
  Filled 2012-02-28: qty 2

## 2012-02-28 MED ORDER — PREDNISONE 10 MG PO TABS: 20.0000 mg | ORAL_TABLET | Freq: Every day | ORAL | Status: DC

## 2012-02-28 NOTE — ED Notes (Signed)
Returned from xray

## 2012-02-28 NOTE — Progress Notes (Signed)
Anti-Coagulation Progress Note  [name] is a 41 y.o. female who is currently on an anti-coagulation regimen.    RECENT RESULTS: Recent results are below, the most recent result is correlated with a dose of 71.25 mg. per week: Lab Results  Component Value Date   INR 2.1 02/28/2012   INR 3.0 01/31/2012   INR 2.40 01/03/2012    ANTI-COAG DOSE:   Latest dosing instructions   Total Sun Mon Tue Wed Thu Fri Sat   78.75 11.25 mg 11.25 mg 11.25 mg 11.25 mg 11.25 mg 11.25 mg 11.25 mg    (7.5 mg1.5) (7.5 mg1.5) (7.5 mg1.5) (7.5 mg1.5) (7.5 mg1.5) (7.5 mg1.5) (7.5 mg1.5)         ANTICOAG SUMMARY: Anticoagulation Episode Summary              Current INR goal 3.0-3.5 Next INR check 03/20/2012   INR from last check 2.1! (02/28/2012)     Weekly max dose (mg)  Target end date Indefinite   Indications Pulmonary embolism and infarction, Long term current use of anticoagulant   INR check location [facility] Preferred lab    Send INR reminders to ANTICOAG IMP   Comments History of multiple episodes of VTED. Diagnosed with familial clotting disorder. Seen at [facility] for pulmonary hypertension. She should have a targeted INR range of 3.0 - 3.5. She will need to continue upon warfarin therapy indefinitely.            ANTICOAG TODAY: Anticoagulation Summary as of 02/28/2012              INR goal 3.0-3.5     Selected INR 2.1! (02/28/2012) Next INR check 03/20/2012   Weekly max dose (mg)  Target end date Indefinite   Indications Pulmonary embolism and infarction, Long term current use of anticoagulant    Anticoagulation Episode Summary              INR check location [facility] Preferred lab    Send INR reminders to ANTICOAG IMP   Comments History of multiple episodes of VTED. Diagnosed with familial clotting disorder. Seen at [facility] for pulmonary hypertension. She should have a targeted INR range of 3.0 - 3.5. She will need to continue upon warfarin therapy indefinitely.            PATIENT INSTRUCTIONS: Patient Instructions  Patient instructed to take medications as defined in the Anti-coagulation Track section of this encounter.  Patient instructed to take today's dose.  Patient verbalized understanding of these instructions.        FOLLOW-UP Return in 3 weeks (on 03/20/2012) for Follow up INR at 1030h.  Hulen Luster, III Pharm.D., CACP

## 2012-02-28 NOTE — Discharge Instructions (Signed)
Bronchitis  Bronchitis is the body's way of reacting to injury and/or infection (inflammation) of the bronchi. Bronchi are the air tubes that extend from the windpipe into the lungs. If the inflammation becomes severe, it may cause shortness of breath.  CAUSES   Inflammation may be caused by:   A virus.   Germs (bacteria).   Dust.   Allergens.   Pollutants and many other irritants.  The cells lining the bronchial tree are covered with tiny hairs (cilia). These constantly beat upward, away from the lungs, toward the mouth. This keeps the lungs free of pollutants. When these cells become too irritated and are unable to do their job, mucus begins to develop. This causes the characteristic cough of bronchitis. The cough clears the lungs when the cilia are unable to do their job. Without either of these protective mechanisms, the mucus would settle in the lungs. Then you would develop pneumonia.  Smoking is a common cause of bronchitis and can contribute to pneumonia. Stopping this habit is the single most important thing you can do to help yourself.  TREATMENT    Your caregiver may prescribe an antibiotic if the cough is caused by bacteria. Also, medicines that open up your airways make it easier to breathe. Your caregiver may also recommend or prescribe an expectorant. It will loosen the mucus to be coughed up. Only take over-the-counter or prescription medicines for pain, discomfort, or fever as directed by your caregiver.   Removing whatever causes the problem (smoking, for example) is critical to preventing the problem from getting worse.   Cough suppressants may be prescribed for relief of cough symptoms.   Inhaled medicines may be prescribed to help with symptoms now and to help prevent problems from returning.   For those with recurrent (chronic) bronchitis, there may be a need for steroid medicines.  SEEK IMMEDIATE MEDICAL CARE IF:    During treatment, you develop more pus-like mucus (purulent  sputum).   You have a fever.   Your baby is older than 3 months with a rectal temperature of 102 F (38.9 C) or higher.   Your baby is 3 months old or younger with a rectal temperature of 100.4 F (38 C) or higher.   You become progressively more ill.   You have increased difficulty breathing, wheezing, or shortness of breath.  It is necessary to seek immediate medical care if you are elderly or sick from any other disease.  MAKE SURE YOU:    Understand these instructions.   Will watch your condition.   Will get help right away if you are not doing well or get worse.  Document Released: 07/19/2005 Document Revised: 07/08/2011 Document Reviewed: 05/28/2008  ExitCare Patient Information 2012 [employer]  Dehydration, Adult  Dehydration is when you lose more fluids from the body than you take in. Vital organs like the kidneys, brain, and heart cannot function without a proper amount of fluids and salt. Any loss of fluids from the body can cause dehydration.   CAUSES    Vomiting.   Diarrhea.   Excessive sweating.   Excessive urine output.   Fever.  SYMPTOMS   Mild dehydration   Thirst.   Dry lips.   Slightly dry mouth.  Moderate dehydration   Very dry mouth.   Sunken eyes.   Skin does not bounce back quickly when lightly pinched and released.   Dark urine and decreased urine production.   Decreased tear production.   Headache.  Severe dehydration     Very dry mouth.   Extreme thirst.   Rapid, weak pulse (more than 100 beats per minute at rest).   Cold hands and feet.   Not able to sweat in spite of heat and temperature.   Rapid breathing.   Blue lips.   Confusion and lethargy.   Difficulty being awakened.   Minimal urine production.   No tears.  DIAGNOSIS   Your caregiver will diagnose dehydration based on your symptoms and your exam. Blood and urine tests will help confirm the diagnosis. The diagnostic evaluation should also identify the cause of dehydration.  TREATMENT   Treatment  of mild or moderate dehydration can often be done at home by increasing the amount of fluids that you drink. It is best to drink small amounts of fluid more often. Drinking too much at one time can make vomiting worse. Refer to the home care instructions below.  Severe dehydration needs to be treated at the hospital where you will probably be given intravenous (IV) fluids that contain water and electrolytes.  HOME CARE INSTRUCTIONS    Ask your caregiver about specific rehydration instructions.   Drink enough fluids to keep your urine clear or pale yellow.   Drink small amounts frequently if you have nausea and vomiting.   Eat as you normally do.   Avoid:   Foods or drinks high in sugar.   Carbonated drinks.   Juice.   Extremely hot or cold fluids.   Drinks with caffeine.   Fatty, greasy foods.   Alcohol.   Tobacco.   Overeating.   Gelatin desserts.   Wash your hands well to avoid spreading bacteria and viruses.   Only take over-the-counter or prescription medicines for pain, discomfort, or fever as directed by your caregiver.   Ask your caregiver if you should continue all prescribed and over-the-counter medicines.   Keep all follow-up appointments with your caregiver.  SEEK MEDICAL CARE IF:   You have abdominal pain and it increases or stays in one area (localizes).   You have a rash, stiff neck, or severe headache.   You are irritable, sleepy, or difficult to awaken.   You are weak, dizzy, or extremely thirsty.  SEEK IMMEDIATE MEDICAL CARE IF:    You are unable to keep fluids down or you get worse despite treatment.   You have frequent episodes of vomiting or diarrhea.   You have blood or green matter (bile) in your vomit.   You have blood in your stool or your stool looks black and tarry.   You have not urinated in 6 to 8 hours, or you have only urinated a small amount of very dark urine.   You have a fever.   You faint.  MAKE SURE YOU:    Understand these instructions.   Will  watch your condition.   Will get help right away if you are not doing well or get worse.  Document Released: 07/19/2005 Document Revised: 07/08/2011 Document Reviewed: 03/08/2011  [employer] Patient Information 2012 [employer].

## 2012-02-28 NOTE — Progress Notes (Signed)
Subjective:   Patient ID: [name] female   DOB: January 02, 1971 41 y.o.   MRN: [id number]  HPI: [name] is a 41 y.o. woman with history of pulmonary hypertension and antiphospholipid syndrome and massive pulmonary embolism who is on chronic anticoagulation. Her goal INR is 2.5-3.5. She arrived to clinic for an INR check by Dr. Alexandria Lodge today, and noted complaints of dizziness, shortness of breath at rest and worse with exertion, and bilateral frontal throbbing headache that began on Saturday. Over the weekend, she also notes increased swelling, redness, and pain on her right lower extremity. She does note that her great lower extremity is chronically swollen. She notes that her pain feels like her muscles are cramping, and right is worse than her left. Pain is better with massage. She reports a dry cough since yesterday. She denies any excess blood loss (today is the first day of her menstrual cycle, during which she normally bleeds less) as well as fever or chills. Of note, on Saturday was her last day of fasting for the last 2 weeks for Ramadan.  CBG in clinic was 170. She notes that she had tea and a piece of toast earlier this morning.   Current Outpatient Prescriptions  Medication Sig Dispense Refill  . acetaminophen (TYLENOL) 325 MG tablet Take 650 mg by mouth every 8 (eight) hours as needed.        . cetirizine (ZYRTEC ALLERGY) 10 MG tablet Take 10 mg by mouth daily as needed.        . sodium chloride (OCEAN) 0.65 % SOLN nasal spray 1 spray by Nasal route every 6 (six) hours.  90 mL  6  . spironolactone (ALDACTONE) 25 MG tablet Take 1 tablet (25 mg total) by mouth daily.  30 tablet  5  . warfarin (COUMADIN) 7.5 MG tablet Take as directed by anticoagulation clinic provider. Currently taking 1 and 1/2 tablets 5 days/wk; 2 tablets on 2 days/wk. Requests 90 day supply.  150 tablet  1   Review of Systems: Constitutional: Denies fever, chills, appetite change.  HEENT: Denies  photophobia, eye pain, redness, hearing loss, ear pain, congestion, sore throat, rhinorrhea, sneezing, mouth sores, trouble swallowing, neck pain, neck stiffness and tinnitus.   Respiratory: Denies chest tightness & wheezing.   Cardiovascular: Denies chest pain, palpitations Gastrointestinal: Denies nausea, vomiting, abdominal pain, diarrhea, constipation, blood in stool and abdominal distention.  Genitourinary: Denies dysuria, urgency, frequency, hematuria, flank pain and difficulty urinating.  Musculoskeletal: Denies back pain, joint swelling, arthralgias and gait problem.  Skin: Denies pallor, rash and wound.  Neurological: Denies seizures, syncope, numbness and headaches.  Psychiatric/Behavioral: Denies suicidal ideation, mood changes, confusion, nervousness, sleep disturbance and agitation  Objective:  Physical Exam: Filed Vitals:   02/28/12 1041 02/28/12 1042 02/28/12 1044 02/28/12 1046  BP: 110/78 133/90 127/87 122/85  Pulse: 89 86 87 87  Temp: 98.4 F (36.9 C)  98.4 F (36.9 C)   TempSrc: Oral  Oral   Weight: 195 lb 6.4 oz (88.633 kg)  195 lb 6.4 oz (88.633 kg)   SpO2: 99%  99%    Orthostatic vital signs: Supine- 133/90, heart rate 86 Standing- 122/85, heart rate 87  Constitutional: Vital signs reviewed.  Patient is a well-developed and well-nourished woman who appears somewhat ill Mouth: no erythema or exudates, MMM Eyes: PERRL, EOMI, conjunctivae normal, No scleral icterus.   Cardiovascular: RRR, S1 normal, S2 normal, no MRG, pulses symmetric and intact bilaterally Pulmonary/Chest: CTAB, no wheezes, rales, or rhonchi Abdominal: Soft. Non-tender,  non-distended, bowel sounds are normal, no masses, organomegaly, or guarding present.  Musculoskeletal: No joint deformities, erythema, or stiffness, ROM full; right lower extremity with nonpitting edema extending to the knee and somewhat tender to deep palpation; right lower extremity mildly warmer than left lower  Neurological:  A&O x3, Strength is normal and symmetric bilaterally, cranial nerve II-XII are grossly intact, no focal motor deficit, sensory intact to light touch bilaterally.  Skin: Warm, dry and intact. No rash, cyanosis, or clubbing.  Psychiatric: Normal mood and affect. speech and behavior is normal. Judgment and thought content normal. Cognition and memory are normal.   Assessment & Plan:   Case and care discussed with Dr. Kem Kays. Given history of antiphospholipid syndrome and massive pulmonary emboli as well as revised Geneva score of 13, will send patient to the emergency department for thorough evaluation. She will likely require a CBC, CMET, and CTA. Given that her blood sugar was 170, we would like to check a hemoglobin A1c as well. Please see problem-oriented charting for further details.

## 2012-02-28 NOTE — ED Provider Notes (Signed)
History     CSN: [id number]  Arrival date & time 02/28/12  1153   First MD Initiated Contact with Patient 02/28/12 1434      Chief Complaint  Patient presents with  . Shortness of Breath    (Consider location/radiation/quality/duration/timing/severity/associated sxs/prior treatment) HPI Comments: Pt reports coughing, feeling fatigued and dizzy described as light headed, not vertigo along with generalized diffuse HA.  No rash, stiff neck, no N/V/D.  Unsure of fever or chills.  No obv sick contacts.  She denies h/o asthma, pneumonia.  She has h/o pulmonary HTN and PE and is on coumadin.  She denies any sig CP or tightness.  She reports compliance with coumadin.  Symptoms for 3 days.    Patient is a 41 y.o. female presenting with shortness of breath. The history is provided by the patient.  Shortness of Breath  Associated symptoms include cough and shortness of breath. Pertinent negatives include no fever, no rhinorrhea and no sore throat.    Past Medical History  Diagnosis Date  . Pulmonary embolism 2005    Massive  . Pulmonary hypertension     2/2 chronic PE. Sees Dr. Janee Morn, pulmonologist at [facility]  . Primary hypercoagulable state     No details known. Needs review  . Hepatitis B infection     Hep B core Ab 04/2005  . IUD     Mirena  . DVT (deep venous thrombosis)     Past Surgical History  Procedure Date  . Pulmonary artery endarterectomy   . Cardiac catheterization 11/2005    R heart cath : Severe pulmonary arterial HTN with evidence of cor pulmonale. Vasodilatory challenge not performed.    Family History  Problem Relation Age of Onset  . Hypertension Mother     History  Substance Use Topics  . Smoking status: Never Smoker   . Smokeless tobacco: Not on file  . Alcohol Use: No    OB History    Grav Para Term Preterm Abortions TAB SAB Ect Mult Living   4 3 3  1  1   3       Review of Systems  Constitutional: Positive for fatigue. Negative for fever and  chills.  HENT: Negative for congestion, sore throat, rhinorrhea, trouble swallowing and voice change.   Respiratory: Positive for cough and shortness of breath.   Cardiovascular: Negative for leg swelling.  Gastrointestinal: Negative for nausea, vomiting and diarrhea.  Musculoskeletal: Positive for arthralgias. Negative for back pain.  Skin: Negative for rash.  Neurological: Positive for dizziness and light-headedness. Negative for syncope.  All other systems reviewed and are negative.    Allergies  Aspirin  Home Medications   Current Outpatient Rx  Name Route Sig Dispense Refill  . ACETAMINOPHEN 325 MG PO TABS Oral Take 650 mg by mouth every 8 (eight) hours as needed. For pain    . CETIRIZINE HCL 10 MG PO TABS Oral Take 10 mg by mouth daily as needed. For allergies    . SALINE 0.65 % NA SOLN Nasal Place 1 spray into the nose every 6 (six) hours as needed. For dry nose    . SPIRONOLACTONE 25 MG PO TABS Oral Take 1 tablet (25 mg total) by mouth daily. 30 tablet 5  . WARFARIN SODIUM 7.5 MG PO TABS Oral Take 7.5-11.25 mg by mouth daily. Take 1 1/2 tabelt by mouth on Sunday, Monday, Thursday, Saturday.  Take 1 tablet by mouth on Wednesday and friday    . PREDNISONE 10 MG  PO TABS Oral Take 2 tablets (20 mg total) by mouth daily. 7 tablet 0    BP 119/97  Pulse 88  Temp 97.5 F (36.4 C)  Resp 16  SpO2 98%  LMP 02/28/2012  Physical Exam  Nursing note and vitals reviewed. Constitutional: She is oriented to person, place, and time. She appears well-developed and well-nourished.  HENT:  Head: Normocephalic and atraumatic.  Eyes: Conjunctivae are normal. Pupils are equal, round, and reactive to light. No scleral icterus.  Neck: Normal range of motion. Neck supple.  Cardiovascular: Normal rate and regular rhythm.   No murmur heard. Pulmonary/Chest: Effort normal and breath sounds normal. No respiratory distress. She has no wheezes. She has no rales.  Abdominal: Soft. She exhibits no  distension. There is no tenderness.  Musculoskeletal: She exhibits no edema.  Neurological: She is alert and oriented to person, place, and time. No cranial nerve deficit. Coordination normal.  Skin: Skin is warm and dry. No rash noted.    ED Course  Procedures (including critical care time)  Labs Reviewed  CBC WITH DIFFERENTIAL - Abnormal; Notable for the following:    RBC 5.31 (*)     Hemoglobin 16.2 (*)     MCHC 36.1 (*)     Monocytes Relative 14 (*)     All other components within normal limits  PROTIME-INR - Abnormal; Notable for the following:    Prothrombin Time 23.6 (*)     INR 2.06 (*)     All other components within normal limits  BASIC METABOLIC PANEL - Abnormal; Notable for the following:    GFR calc non Af Amer 72 (*)     GFR calc Af Amer 84 (*)     All other components within normal limits  PRO B NATRIURETIC PEPTIDE   Dg Chest 2 View  02/28/2012  *RADIOLOGY REPORT*  Clinical Data: Cough  CHEST - 2 VIEW  Comparison: 12/24/2007  Findings: Mild cardiomegaly.  Clear lungs.  No pneumothorax and no pleural effusion.  Stable thoracic spine.  IMPRESSION: Cardiomegaly without edema.  Original Report Authenticated By: Donavan Burnet, M.D.     1. Dehydration   2. Bronchitis     RA sat is 98% and I interpret to be normal.  ECG at time 15:49 shows NSR at rate 79, normal axis, biatrial enlargement, non specific flattened anterior t waves.    3:55 PM Pt reports after neb, she is breathing much improved and feels improved.  She has received about 1/3 of IVF bolus as well.  Will d/c with inhaler for bronchitis and small Rx for prednisone.    MDM   Pt is in no distress.  Lungs clear, however has a dry cough during my evaluation.  I reviewed CXR and agree with radiologist interpretation of cardiomegaly without acute process.  Will try a neb and give IVF's.  She seems to be more hemoconcentrated than on prior Hgb values.         Gavin Pound. Yarelis Ambrosino, MD 02/28/12 0981

## 2012-02-28 NOTE — Assessment & Plan Note (Signed)
Patient has chronic right lower extremity swelling with intermittent exacerbation. She reports that pain, erythema, and swelling were worse this weekend. I believe that she has had 2 massive pulmonary emboli in the past, and her INR goal is higher at 2.5-3.5. Her INR today is 2.1. Revised Geneva score is 13. Given symptoms of dizziness, shortness of breath, and headache, I think it is reasonable to rule out pulmonary embolus. Symptoms may also be due to anemia, especially in light of the beginning of her menstrual cycle while on Coumadin. Leg pain may be muscle cramping due to electrolyte abnormalities given recent history of fasting. Initially, given symptoms of dizziness and headache and noted chronic lower extremity edema and pain, I thought symptoms were due to hypoglycemia, but CBG was 170. We will need to check a hemoglobin A1c for her. Her last hemoglobin A1c was 5.5 and 2012.  Patient's case was discussed with Dr. Anitra Lauth and Marrero, and patient was transferred from outpatient clinics to the ED.

## 2012-02-28 NOTE — Patient Instructions (Signed)
Patient instructed to take medications as defined in the Anti-coagulation Track section of this encounter.  Patient instructed to take today's dose.  Patient verbalized understanding of these instructions.    

## 2012-02-28 NOTE — ED Notes (Signed)
Dry cough sob and dizzy x 3 days

## 2012-02-29 NOTE — Progress Notes (Signed)
Agree with plan 

## 2012-03-09 ENCOUNTER — Encounter: Payer: Self-pay | Admitting: Internal Medicine

## 2012-03-09 ENCOUNTER — Ambulatory Visit (INDEPENDENT_AMBULATORY_CARE_PROVIDER_SITE_OTHER): Payer: 59 | Admitting: Internal Medicine

## 2012-03-09 ENCOUNTER — Encounter: Payer: Self-pay | Admitting: *Deleted

## 2012-03-09 VITALS — BP 117/86 | HR 107 | Temp 97.9°F | Ht 64.0 in | Wt 194.1 lb

## 2012-03-09 DIAGNOSIS — M7989 Other specified soft tissue disorders: Secondary | ICD-10-CM

## 2012-03-09 DIAGNOSIS — R5381 Other malaise: Secondary | ICD-10-CM

## 2012-03-09 DIAGNOSIS — J309 Allergic rhinitis, unspecified: Secondary | ICD-10-CM

## 2012-03-09 DIAGNOSIS — R51 Headache: Secondary | ICD-10-CM

## 2012-03-09 DIAGNOSIS — Z79899 Other long term (current) drug therapy: Secondary | ICD-10-CM

## 2012-03-09 DIAGNOSIS — J329 Chronic sinusitis, unspecified: Secondary | ICD-10-CM

## 2012-03-09 DIAGNOSIS — R5383 Other fatigue: Secondary | ICD-10-CM | POA: Insufficient documentation

## 2012-03-09 DIAGNOSIS — Z7901 Long term (current) use of anticoagulants: Secondary | ICD-10-CM

## 2012-03-09 DIAGNOSIS — R0602 Shortness of breath: Secondary | ICD-10-CM

## 2012-03-09 LAB — CBC WITH DIFFERENTIAL/PLATELET
Basophils Absolute: 0 10*3/uL (ref 0.0–0.1)
Basophils Relative: 0 % (ref 0–1)
Eosinophils Absolute: 0.1 10*3/uL (ref 0.0–0.7)
Eosinophils Relative: 2 % (ref 0–5)
Lymphocytes Relative: 36 % (ref 12–46)
MCHC: 34.8 g/dL (ref 30.0–36.0)
MCV: 84.9 fL (ref 78.0–100.0)
Neutrophils Relative %: 47 % (ref 43–77)
Platelets: 251 10*3/uL (ref 150–400)
RDW: 14.4 % (ref 11.5–15.5)
WBC: 5.7 10*3/uL (ref 4.0–10.5)

## 2012-03-09 LAB — TSH: TSH: 0.985 u[IU]/mL (ref 0.350–4.500)

## 2012-03-09 LAB — D-DIMER, QUANTITATIVE (NOT AT ARMC): D-Dimer, Quant: 0.36 ug/mL-FEU (ref 0.00–0.48)

## 2012-03-09 MED ORDER — FLUTICASONE PROPIONATE 50 MCG/ACT NA SUSP: 2.0000 | Freq: Every day | NASAL | Status: DC

## 2012-03-09 NOTE — Progress Notes (Unsigned)
Pt presents stating she feels very bad, states she has abd pain, weakness, dizziness, having her "period", period over 1 week. Throat very dry and hard to swallow. She was seen for this previously and states she is getting worse. abd appears on visual exam very swollen or distended but i have never visualized the pt before this day. She is slumped in my pt chair and seems very fatigued. appt per sharonb. Today at 1315 dr Milbert Coulter

## 2012-03-09 NOTE — Progress Notes (Signed)
PSO2 during ambulation 95-100% room air. Stanton Kidney Daxen Lanum RN 03/09/12 3:05PM

## 2012-03-09 NOTE — Progress Notes (Signed)
Patient ID: [name], female   DOB: April 16, 1971, 41 y.o.   MRN: [id number]  Subjective:   Patient ID: [name] female   DOB: February 22, 1971 41 y.o.   MRN: [id number]  HPI: [name] is a 41 y.o. F with a history of hypercoagulable state with pulmonary embolism s/p IVC filter placement and pulmonary thrombectomy, chronic RLE edema, antiphospholipid antibody syndrome, pulmonary HTN, right heart catheterization, and Hep B, who presents with continued SOB, dizziness, malaise, headache, and dry mouth that has been occuring for the past 2-3 weeks. She was seen in clinic 7/29 for similar symptoms, concerning for a new PE and was sent to the ED. In the ED, a CXR was obtained and she was diagnosed with bronchitis and dehydration and was given prednisone for 1 week and told to use her Albuterol inhaler as needed. She presented to the clinic today with continuing symptoms that were no worse than previously, but not improved. She states that her SOB is improved with the albuterol, but she came to the clinic b/c she was concerned that she might have another PE, as her SOB was similar to when she had her last PE. She has chronic RLE edema, and while in clinic, her RLE edema, erythema, and pain was improved from her LOV, per pt.  She does endorse PND, nasal congestion, dry cough, bad taste in her mouth, dizziness, malaise, and a pressure-type head ache in her forehead, temples, right cheek, down to the anterior portion of her ears.    Past Medical History  Diagnosis Date  . Pulmonary embolism 2005    Massive  . Pulmonary hypertension     2/2 chronic PE. Sees Dr. Janee [name], pulmonologist at [facility]  . Primary hypercoagulable state     No details known. Needs review  . Hepatitis B infection     Hep B core Ab 04/2005  . IUD     Mirena  . DVT (deep venous thrombosis)    Current Outpatient Prescriptions  Medication Sig Dispense Refill  . acetaminophen (TYLENOL) 325 MG tablet Take 650 mg by mouth  every 8 (eight) hours as needed. For pain      . albuterol (PROVENTIL HFA;VENTOLIN HFA) 108 (90 BASE) MCG/ACT inhaler Inhale 2 puffs into the lungs every 6 (six) hours as needed.      . cetirizine (ZYRTEC ALLERGY) 10 MG tablet Take 10 mg by mouth daily as needed. For allergies      . sodium chloride (OCEAN) 0.65 % SOLN nasal spray Place 1 spray into the nose every 6 (six) hours as needed. For dry nose      . spironolactone (ALDACTONE) 25 MG tablet Take 1 tablet (25 mg total) by mouth daily.  30 tablet  5  . warfarin (COUMADIN) 7.5 MG tablet Take 7.5-11.25 mg by mouth daily. Take 1 1/2 tabelt by mouth on Sunday, Monday, Thursday, Saturday.  Take 1 tablet by mouth on Wednesday and friday      . fluticasone (FLONASE) 50 MCG/ACT nasal spray 1 spray by Nasal route daily.  16 g  2  . fluticasone (FLONASE) 50 MCG/ACT nasal spray Place 2 sprays into the nose daily.  16 g  3  . predniSONE (DELTASONE) 10 MG tablet Take 2 tablets (20 mg total) by mouth daily.  7 tablet  0   Family History  Problem Relation Age of Onset  . Hypertension Mother    History   Social History  . Marital Status: Married    Spouse Name: N/A  Number of Children: N/A  . Years of Education: N/A   Social History Main Topics  . Smoking status: Never Smoker   . Smokeless tobacco: None  . Alcohol Use: No  . Drug Use: No  . Sexually Active: None   Other Topics Concern  . None   Social History Narrative   Pt lives in [place], braids hair for a living.    Review of Systems: Constitutional: Endorses general malaise and fatigue. Denies fever, chills, or diaphoresis. HEENT: Endorses nasal congestion, rhinorrhea, PND, and headache in forehead into neck (anterior to ears). Denies photophobia, eye pain, redness, or hearing loss.   Respiratory: Endorses SOB, DOE, and dry cough.   Cardiovascular: Endorses chronic RLE edema and pain. Denies chest pain or palpitations. Gastrointestinal: Denies nausea, vomiting, abdominal pain,  diarrhea, constipation, blood in stool and abdominal distention.  Genitourinary: Denies dysuria, urgency, frequency, hematuria, flank pain and difficulty urinating.  Musculoskeletal: Endorses chronic RLE edema and rubor. Denies myalgias, back pain, joint swelling, or gait problems.  Skin: Denies pallor, rash and wound.  Neurological: Endorses dizziness and pressure-type headache. Denies seizures or syncope. Hematological: Denies adenopathy.     Objective:  Physical Exam: Filed Vitals:   03/09/12 1348 03/09/12 1456 03/09/12 1457 03/09/12 1458  BP: 121/84 127/85 114/81 117/86  Pulse: 93 92 94 107  Temp: 97.9 F (36.6 C)     TempSrc: Oral     Height: 5\' 4"  (1.626 m)     Weight: 194 lb 1.6 oz (88.043 kg)     SpO2: 99%      Constitutional: Vital signs reviewed.  Patient is a well-developed and well-nourished female who generally appears ill; she is cooperative with exam. Alert and oriented x3.  Head: Normocephalic and atraumatic Ear: TM normal bilaterally Eyes: PERRL, EOMI, conjunctivae normal, No scleral icterus.  Nose: Turbinates edmatous with moderate obstruction of nasal cavity. Mouth: No erythema or exudates, MMM, no drainage seen in back of throat Neck: Supple, Trachea midline normal ROM, No JVD, mass, thyromegaly.  Cardiovascular: Mild tachycardia, regualr rhythm, S1 normal, S2 normal, no MRG, pulses symmetric and intact bilaterally Pulmonary/Chest: CTAB, no wheezes, rales, or rhonchi Abdominal: Soft. Non-tender, non-distended, bowel sounds are normal, no masses, organomegaly, or guarding present.  GU: No CVA tenderness Musculoskeletal: RLE nonpitting edema, mildly tender to palpation, no erythema present. No joint deformities, or stiffness, full ROM. Hematology: No cervical adenopathy.  Neurological: A&O x3, Strength is normal and symmetric bilaterally, cranial nerve II-XII are grossly intact, no focal motor deficit, sensory intact to light touch bilaterally.  Skin: Warm, dry and  intact. No rash, cyanosis, or clubbing.  Psychiatric: Tearful at times due to concerns for her health, but appropriate. Judgment and thought content normal. Cognition and memory are normal.   Assessment & Plan:   41 yo F whole presents with continued symptoms of SOB, dizziness, dry cough, headache, and malaise x 2-3 weeks; the differential diagnoses include PE, increased cor pulmonale, hypothyroidism, GERD, sinusitis, and dehydration. Given her previous pulmonary thrombectomy, her cor pulmonale should be corrected, and GERD seemed less likely to be causing these acute symptoms over the past 2-3 weeks. Concerned for a possible pulmonary embolus, we decided to check a d-dimer, and if it was positive, we would then consider obtaining a V/Q scan or possibly a CT PE and an ECHO. A CBC, Hgb A1c, and TSH were also checked.   Her pulse ox was 99% at rest and  95-100% with ambulation. D-dimer level was 0.36, which was less  concerning for a pulmonary embolus, so at that time further testing was not deemed necessary. Her CBC and CBG were wnl. Her A1c was elevated to 6.3 from 5.2 in 10/12.   However, given her rhinorrhea, post nasal drainage, pressure headache, possible eustachian tube dysfunction, dry cough, bad taste in her mouth, and malaise, her symptoms seem consistent with a sinusitis. She was taking Zyrtec and using Ocean nasal spray, but was not using a steroid nasal spray. She endorsed using one in the past which did help her nasal symptoms. A prescription for Flonase was sent to her pharmacy. She is to continue to use the Ocean in the morning prior to using the Flonase and q6h PRN and continue the Zyrtec daily.

## 2012-03-09 NOTE — Progress Notes (Signed)
Agree with appt Thanks 

## 2012-03-09 NOTE — Patient Instructions (Addendum)
Please continue to take your Zyrtec(cirtuline) daily. Please continue to use your Ocean nasal spray 1-2 sprays in each nostril in the morning prior to using the Fluticasone nasal spray and q6h PRN. After spraying the Ocean, be sure to blow your nose very well, and then spray the Fluticasone, pointing the tip towards the inside of your nose for 2 sprays for one week. After one week, follow up in the clinic. Please continue to take your other medications as prescribed.     Sinus Headache A sinus headache is when your sinuses become clogged or swollen. Sinus headaches can range from mild to severe.   CAUSES A sinus headache can have different causes, such as:  Colds.   Sinus infections.   Allergies.  SYMPTOMS   Symptoms of a sinus headache may vary and can include:  Headache.   Pain or pressure in the face.   Congested or runny nose.   Fever.   Inability to smell.   Pain in upper teeth.  Weather changes can make symptoms worse. TREATMENT   The treatment of a sinus headache depends on the cause.  Sinus pain caused by a sinus infection may be treated with antibiotic medicine.   Sinus pain caused by allergies may be helped by allergy medicines (antihistamines) and medicated nasal sprays.   Sinus pain caused by congestion may be helped by flushing the nose and sinuses with saline solution.  HOME CARE INSTRUCTIONS    If antibiotics are prescribed, take them as directed. Finish them even if you start to feel better.   Only take over-the-counter or prescription medicines for pain, discomfort, or fever as directed by your caregiver.   If you have congestion, use a nasal spray to help reduce pressure.  SEEK IMMEDIATE MEDICAL CARE IF:  You have a fever.   You have headaches more than once a week.   You have sensitivity to light or sound.   You have repeated nausea and vomiting.   You have vision problems.   You have sudden, severe pain in your face or head.   You have a  seizure.   You are confused.   Your sinus headaches do not get better after treatment. Many people think they have a sinus headache when they actually have migraines or tension headaches.  MAKE SURE YOU:    Understand these instructions.   Will watch your condition.   Will get help right away if you are not doing well or get worse.  Document Released: 08/26/2004 Document Revised: 07/08/2011 Document Reviewed: 10/17/2010 [facility] Patient Information 2012 [place], [place].

## 2012-03-11 DIAGNOSIS — J309 Allergic rhinitis, unspecified: Secondary | ICD-10-CM | POA: Insufficient documentation

## 2012-03-11 NOTE — Assessment & Plan Note (Addendum)
Her SOB is improved by her albuterol inhaler. Her pulse ox readings in the clinic were 99% at rest and 95-100% ambulating. Her INR is 2.78 and d-dimer is within normal limits at 0.36, making a PE less likely. Given her nasal symptoms, her SOB is likely contributed from her post-nasal drainage and sinusitis.   She was given a Rx for Flonase to use with her antihistamine and saline nasal spray, which hopefully will help improve her respiratory symptoms. She is to continue to use her Albuterol(Ventolin) rescue inhaler as needed.

## 2012-03-11 NOTE — Assessment & Plan Note (Signed)
She endorses allergic rhinitis with post nasal drainage that was previously improved with a steroid nasal spray, possibly Flonase. She is on Zyrtec and uses saline nasal spray.   Flonase was prescribed during this clinic visit to help improve her nasal symptoms and hopefully improve her pressure headaches.

## 2012-03-11 NOTE — Assessment & Plan Note (Signed)
As discussed in the Assessment and Plan section, given her rhinorrhea, post nasal drainage, pressure headache, dry cough, bad taste in the mouth, malaise, and possible eustachian tube dysfunction, her symptoms seem consistent with sinusitis.   She was taking Zyrtec and using Ocean nasal spray, but was not using a steroid nasal spray. She endorsed using one in the past which did help her nasal symptoms. A prescription for Flonase was sent to her pharmacy. She is to continue to use the Ocean in the morning prior to using the Flonase and q6h PRN and continue the Zyrtec daily.   She is to return to clinic in 1 week, and if her symptoms have not improved, an antibiotic should be considered.

## 2012-03-11 NOTE — Assessment & Plan Note (Addendum)
Her right leg is still swollen and painful, but improved per pt. She is still taking her Coumadin, and her INR on the day of her clinic visit is 2.78.

## 2012-03-11 NOTE — Assessment & Plan Note (Signed)
This has been occuring for the past 2-3 weeks, per pt. She is afebrile and her WBC count is normal at 5.7. The fatigue and malaise are concurrent with her other symptoms, so at this time this appears to be related to her sinusitis. She is to return to clinic in 1 week, and she will be reevaluated then.

## 2012-03-15 ENCOUNTER — Telehealth: Payer: Self-pay | Admitting: *Deleted

## 2012-03-15 NOTE — Telephone Encounter (Signed)
RN at [facility] calls and states when pt was seen there today she was c/o leg pain, the physician was concerned and wanted to have pt's INR and PT checked, the results from 8/8 was relayed to her, she will speak w/ the physician and then call triage back for a physician to physician discussion. Will await her call.

## 2012-03-15 NOTE — Progress Notes (Signed)
I saw, examined, and discussed the patient with Dr Sherrine Maples and agree with the note contained here. The pt gives a good history that everything was fine until 2-3 weeks prior to her visit. I agree with the initial W/U performed by Dr Sherrine Maples, empiric treatment, and return to clinic if no improvement. With her hx, a more aggressive W/U for chronic PE, R heart failure, etc might be indicated if sxs persist.

## 2012-03-15 NOTE — Telephone Encounter (Signed)
I spoke with patient and discussed the reasoning behind changing her medicine.  Per the patient, her physician thought it would be reasonable to change to Rivaroxaban daily to avoid frequent blood draws for INR checks and because her INR had previously been labile.

## 2012-03-15 NOTE — Telephone Encounter (Signed)
Pt came into clinic stating she was seen at [facility] yesterday and gave her a RX  For Rivaroxaban 20 mg.  She would stop coumadin and start this new med. She see's her doctor at [facility] once a year post surgery in 2007. ( IVC filter)  Dr Alexandria Lodge will return on Monday.  Can this wait until then?

## 2012-03-15 NOTE — Telephone Encounter (Signed)
[name] would like to start taking the new medication now, however, I agreed with her that waiting to check her INR and speak with Dr. Alexandria Lodge at her scheduled appointment on Monday would likely be the best course of action at this time.  Will defer change in treatment to that pending appointment.  The patient understood and agreed.

## 2012-03-20 ENCOUNTER — Other Ambulatory Visit: Payer: Self-pay | Admitting: Internal Medicine

## 2012-03-20 ENCOUNTER — Ambulatory Visit (INDEPENDENT_AMBULATORY_CARE_PROVIDER_SITE_OTHER): Payer: 59 | Admitting: Pharmacist

## 2012-03-20 DIAGNOSIS — Z7901 Long term (current) use of anticoagulants: Secondary | ICD-10-CM

## 2012-03-20 DIAGNOSIS — Z1231 Encounter for screening mammogram for malignant neoplasm of breast: Secondary | ICD-10-CM

## 2012-03-20 LAB — POCT INR: INR: 2.8

## 2012-03-20 MED ORDER — RIVAROXABAN 20 MG PO TABS: 20.0000 mg | ORAL_TABLET | Freq: Every day | ORAL | Status: DC

## 2012-03-20 NOTE — Patient Instructions (Signed)
Patient instructed to take medications as defined in the Anti-coagulation Track section of this encounter.  Patient instructed to DISCONTINUE WARFARIN. She is being commenced upon rivaroxaban/Xarelto 20mg  PO QD with food per Dr. Carolee Rota, Pulmonologist at [facility]. He has written for 90 tablets, may refill x 1 year. Patient was counseled extensively upon absolute requirement for compliance to this regimen--with  NO SKIPPED OR OMITTED DOSES to be tolerated. She understands that there will be no more INR monitoring. She understands that sign/symptom recognition of recurrence of blood clots in her legs or lungs would be indication to come to hospital. She understands that signs/symptoms suggestive that her blood is too thin--would require her to come to the hospital.  Patient verbalized understanding of these instructions.

## 2012-03-20 NOTE — Progress Notes (Signed)
Patient being commenced upon rivaroxaban 20mg  by mouth daily with food.  Patient verbalized understanding of these instructions.

## 2012-03-21 NOTE — Progress Notes (Signed)
Agree 

## 2012-03-27 ENCOUNTER — Ambulatory Visit: Payer: 59

## 2012-04-10 ENCOUNTER — Ambulatory Visit (INDEPENDENT_AMBULATORY_CARE_PROVIDER_SITE_OTHER): Payer: 59 | Admitting: Obstetrics & Gynecology

## 2012-04-10 ENCOUNTER — Encounter: Payer: Self-pay | Admitting: Obstetrics & Gynecology

## 2012-04-10 VITALS — BP 136/93 | HR 90 | Temp 97.6°F | Ht 64.0 in | Wt 190.7 lb

## 2012-04-10 DIAGNOSIS — N949 Unspecified condition associated with female genital organs and menstrual cycle: Secondary | ICD-10-CM

## 2012-04-10 DIAGNOSIS — N938 Other specified abnormal uterine and vaginal bleeding: Secondary | ICD-10-CM

## 2012-04-10 MED ORDER — MEDROXYPROGESTERONE ACETATE 5 MG PO TABS: 5.0000 mg | ORAL_TABLET | Freq: Every day | ORAL | Status: DC

## 2012-04-10 NOTE — Patient Instructions (Addendum)
Abnormal Uterine Bleeding Abnormal uterine bleeding can have many causes. Some cases are simply treated, while others are more serious. There are several kinds of bleeding that is considered abnormal, including:  Bleeding between periods.   Bleeding after sexual intercourse.   Spotting anytime in the menstrual cycle.   Bleeding heavier or more than normal.   Bleeding after menopause.  CAUSES  There are many causes of abnormal uterine bleeding. It can be present in teenagers, pregnant women, women during their reproductive years, and women who have reached menopause. Your caregiver will look for the more common causes depending on your age, signs, symptoms and your particular circumstance. Most cases are not serious and can be treated. Even the more serious causes, like cancer of the female organs, can be treated adequately if found in the early stages. That is why all types of bleeding should be evaluated and treated as soon as possible. DIAGNOSIS  Diagnosing the cause may take several kinds of tests. Your caregiver may:  Take a complete history of the type of bleeding.   Perform a complete physical exam and Pap smear.   Take an ultrasound on the abdomen showing a picture of the female organs and the pelvis.   Inject dye into the uterus and Fallopian tubes and X-ray them (hysterosalpingogram).   Place fluid in the uterus and do an ultrasound (sonohysterogrqphy).   Take a CT scan to examine the female organs and pelvis.   Take an MRI to examine the female organs and pelvis. There is no X-ray involved with this procedure.   Look inside the uterus with a telescope that has a light at the end (hysteroscopy).   Scrap the inside of the uterus to get tissue to examine (Dilatation and Curettage, D&C).   Look into the pelvis with a telescope that has a light at the end (laparoscopy). This is done through a very small cut (incision) in the abdomen.  TREATMENT  Treatment will depend on the  cause of the abnormal bleeding. It can include:  Doing nothing to allow the problem to take care of itself over time.   Hormone treatment.   Birth control pills.   Treating the medical condition causing the problem.   Laparoscopy.   Major or minor surgery   Destroying the lining of the uterus with electrical currant, laser, freezing or heat (uterine ablation).  HOME CARE INSTRUCTIONS   Follow your caregiver's recommendation on how to treat your problem.   See your caregiver if you missed a menstrual period and think you may be pregnant.   If you are bleeding heavily, count the number of pads/tampons you use and how often you have to change them. Tell this to your caregiver.   Avoid sexual intercourse until the problem is controlled.  SEEK MEDICAL CARE IF:   You have any kind of abnormal bleeding mentioned above.   You feel dizzy at times.   You are 41 years old and have not had a menstrual period yet.  SEEK IMMEDIATE MEDICAL CARE IF:   You pass out.   You are changing pads/tampons every 15 to 30 minutes.   You have belly (abdominal) pain.   You have a temperature of 100 F (37.8 C) or higher.   You become sweaty or weak.   You are passing large blood clots from the vagina.   You start to feel sick to your stomach (nauseous) and throw up (vomit).  Document Released: 07/19/2005 Document Revised: 07/08/2011 Document Reviewed: 12/12/2008 [employer]   Patient Information 2012 [employer]. 

## 2012-04-10 NOTE — Progress Notes (Signed)
Patient ID: [name], female   DOB: April 04, 1971, 41 y.o.   MRN: [id number] Patient's last menstrual period was 02/25/2012. The patient started Depo-Provera in June. Prior to that she had an IUD that was expulsed. Since the delivery shot she's had daily bleeding like a period. She takes Tylenol for occasional cramping. She would like to stop the Depo-Provera and use condoms instead. As she is having daily bleeding she would like to try to have the bleeding stopped. A prescribed Provera 5 mg daily for 21 days. She'll report if her bleeding does not improve. Her questions were answered.   Dr. Scheryl Darter  04/10/2012 4:40 PM

## 2012-04-12 ENCOUNTER — Ambulatory Visit: Payer: 59

## 2012-04-17 ENCOUNTER — Ambulatory Visit (HOSPITAL_COMMUNITY)
Admission: RE | Admit: 2012-04-17 | Discharge: 2012-04-17 | Disposition: A | Discharge status: Home / Self Care | Payer: 59 | Source: Ambulatory Visit | Attending: Internal Medicine | Admitting: Internal Medicine

## 2012-04-17 DIAGNOSIS — Z1231 Encounter for screening mammogram for malignant neoplasm of breast: Secondary | ICD-10-CM | POA: Insufficient documentation

## 2012-04-17 IMAGING — MG MM DIGITAL SCREENING BILAT
4 series · 4 of 4 positions shown · non-contrast
Comparison: Previous exams.

CLINICAL DATA: Screening.

DIGITAL BILATERAL SCREENING MAMMOGRAM WITH CAD

[R CC]
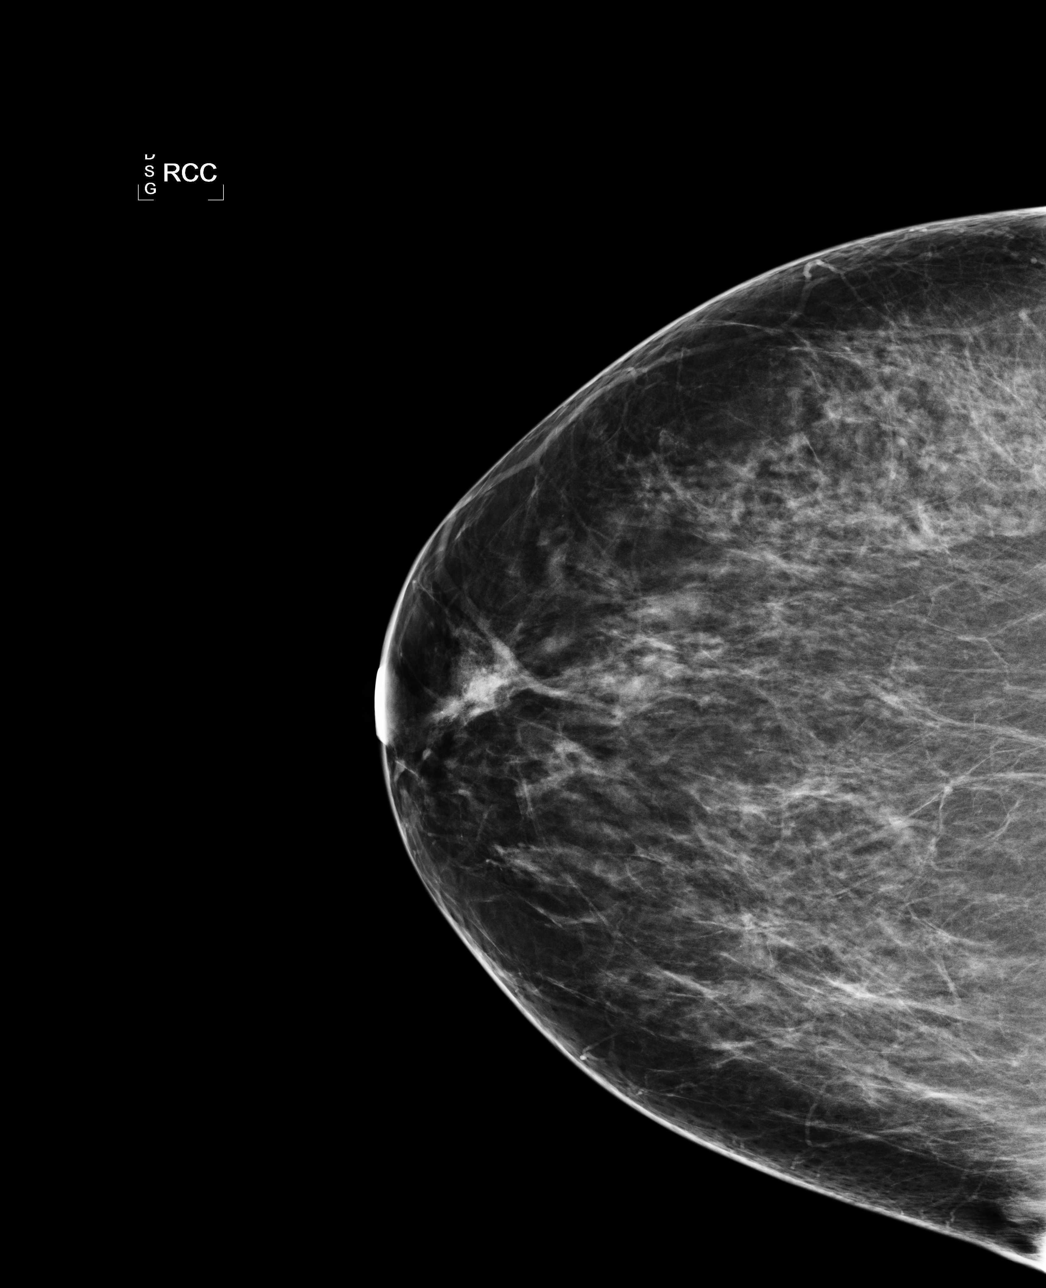

[R MLO]
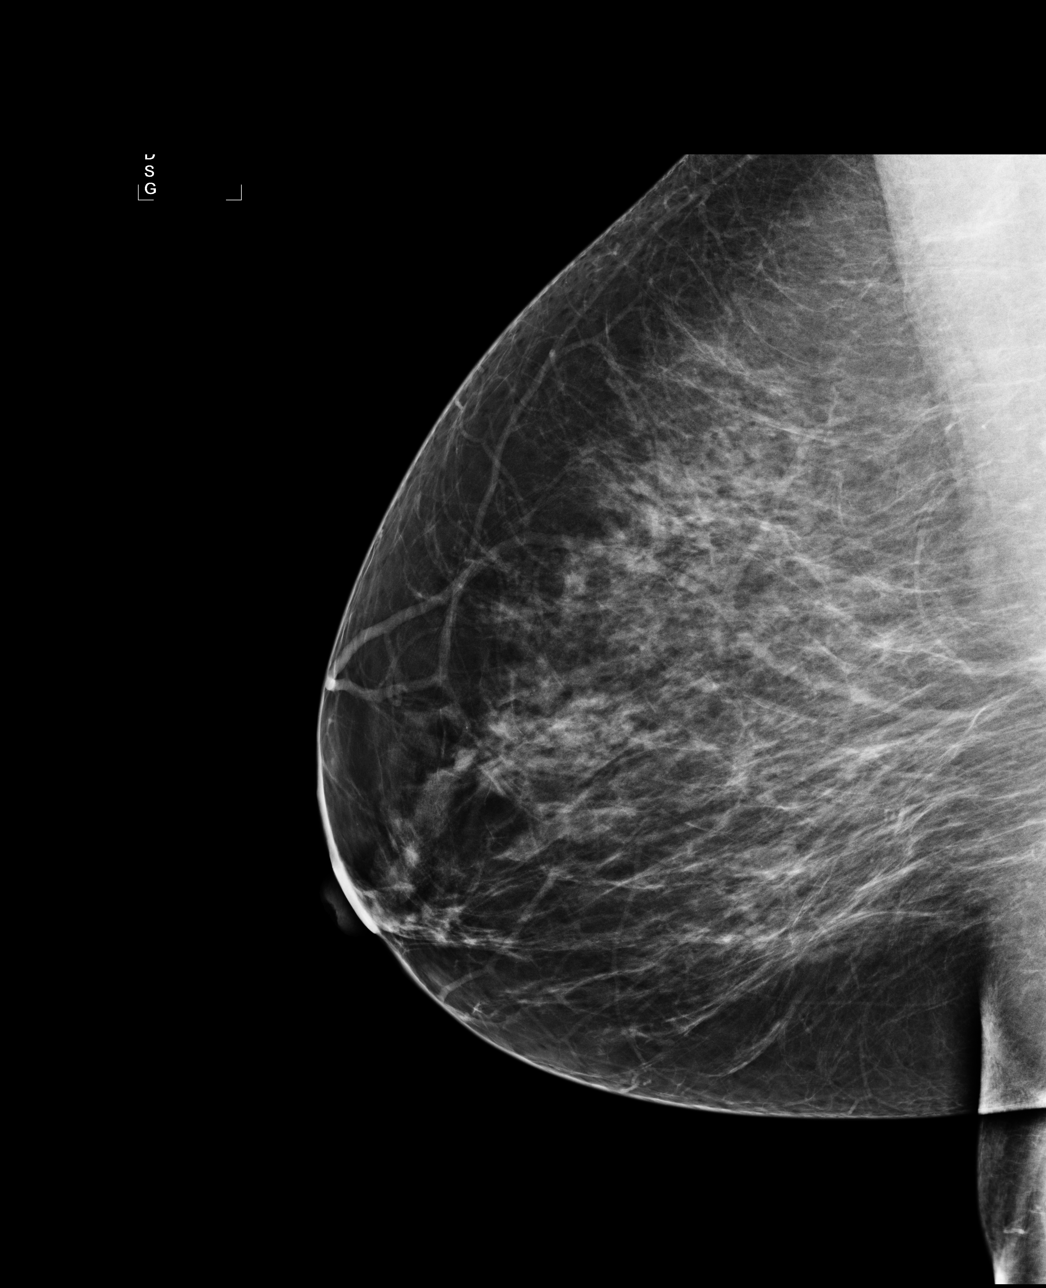

[L CC]
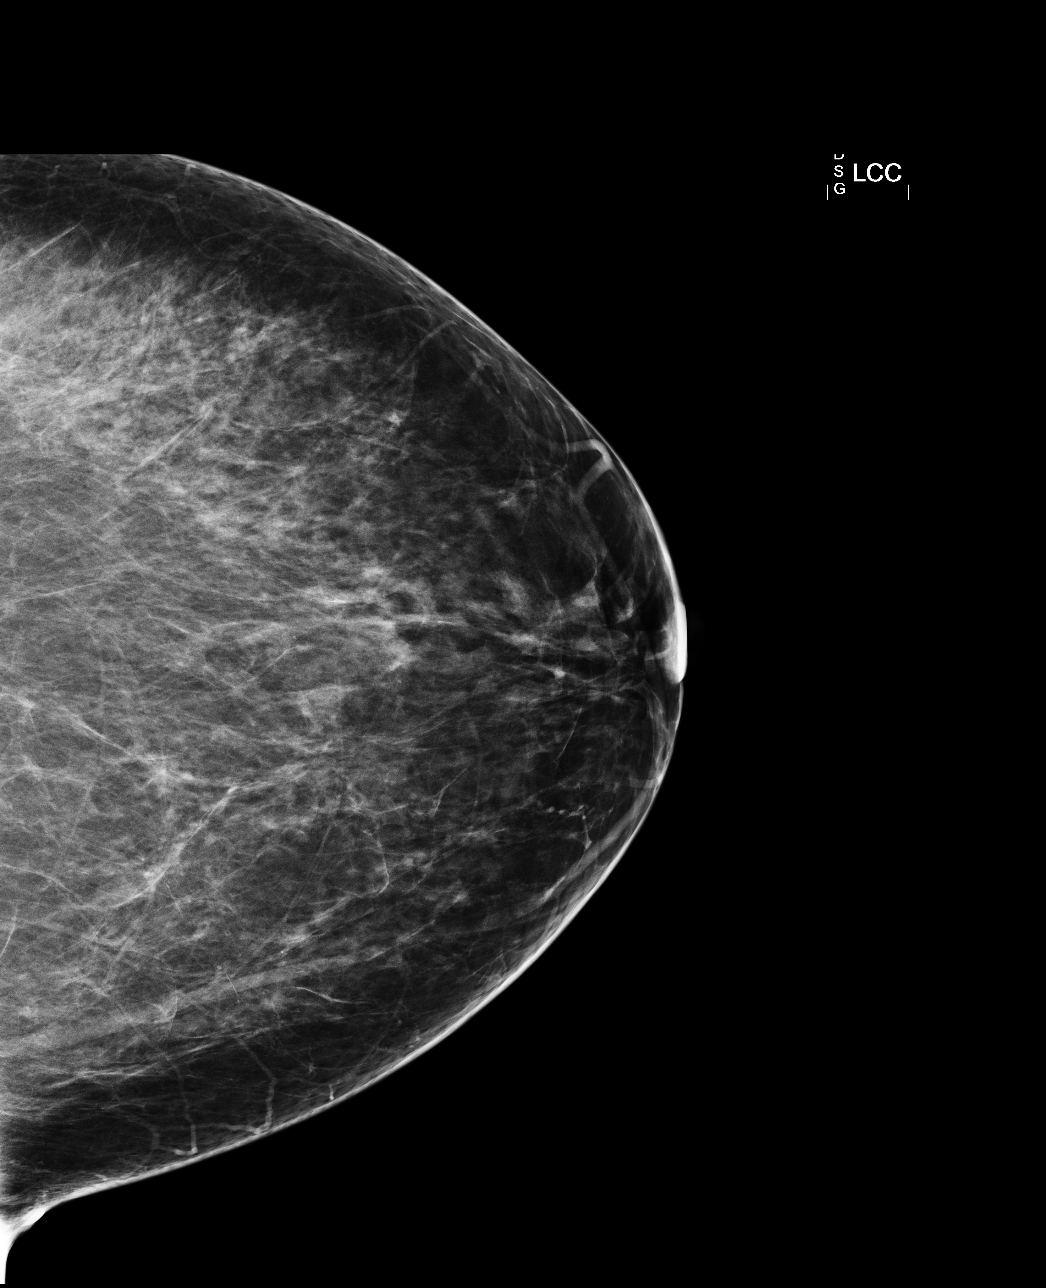

[L MLO]
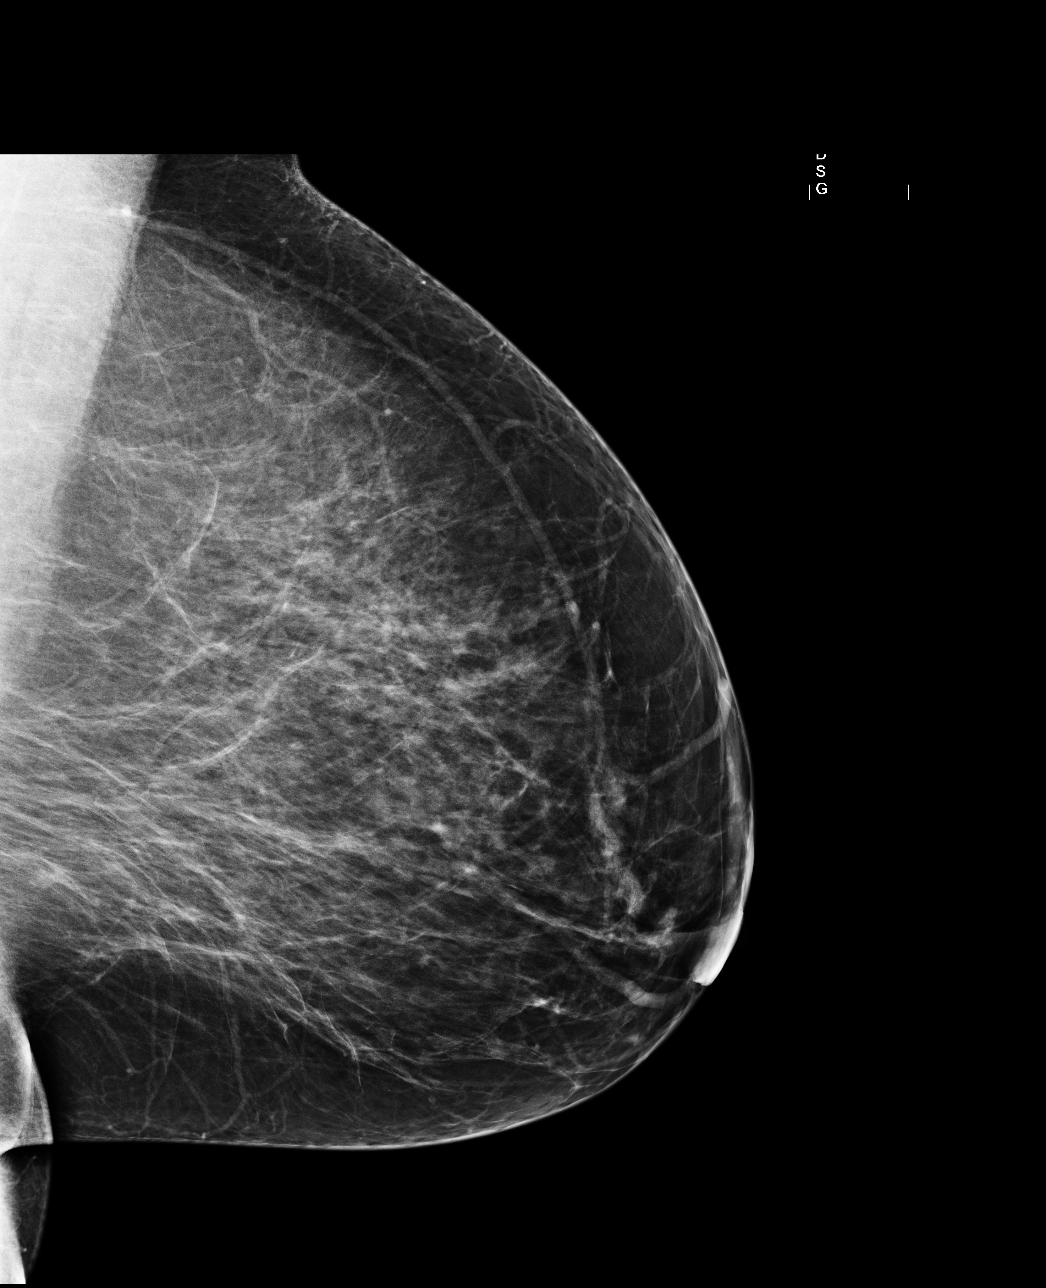

[4 of 4 positions shown; findings below may reference images not displayed]

FINDINGS: Two views of each breast demonstrate scattered
fibroglandular tissue.  In the right breast, a possible mass
warrants further evaluation with spot compression views and
possibly ultrasound.  In the left breast, no masses or malignant
type calcifications are identified.

Images were processed with CAD.
IMPRESSION: Further evaluation is suggested for possible mass in the right
breast.

RECOMMENDATION:
Diagnostic mammogram and possibly ultrasound of the right breast.
(Code:[Y8])

BI-RADS CATEGORY 0:  Incomplete.  Need additional imaging
evaluation and/or prior mammograms for comparison.

## 2012-04-19 ENCOUNTER — Other Ambulatory Visit: Payer: Self-pay | Admitting: Internal Medicine

## 2012-04-19 DIAGNOSIS — R928 Other abnormal and inconclusive findings on diagnostic imaging of breast: Secondary | ICD-10-CM

## 2012-04-20 ENCOUNTER — Ambulatory Visit
Admission: RE | Admit: 2012-04-20 | Discharge: 2012-04-20 | Disposition: A | Discharge status: Home / Self Care | Payer: 59 | Source: Ambulatory Visit | Attending: Internal Medicine | Admitting: Internal Medicine

## 2012-04-20 DIAGNOSIS — R928 Other abnormal and inconclusive findings on diagnostic imaging of breast: Secondary | ICD-10-CM

## 2012-04-21 ENCOUNTER — Encounter: Payer: Self-pay | Admitting: Pulmonary Disease

## 2012-04-21 IMAGING — MG MM DIGITAL DIAGNOSTIC LIMITED*R*
2 series · 2 of 2 positions shown · non-contrast
Comparison: [DATE] [DATE], [DATE], [DATE] [DATE], [DATE]

CLINICAL DATA: Called back from screening mammogram for possible
mass right breast

DIGITAL DIAGNOSTIC RIGHT MAMMOGRAM [DATE]

[R CC]
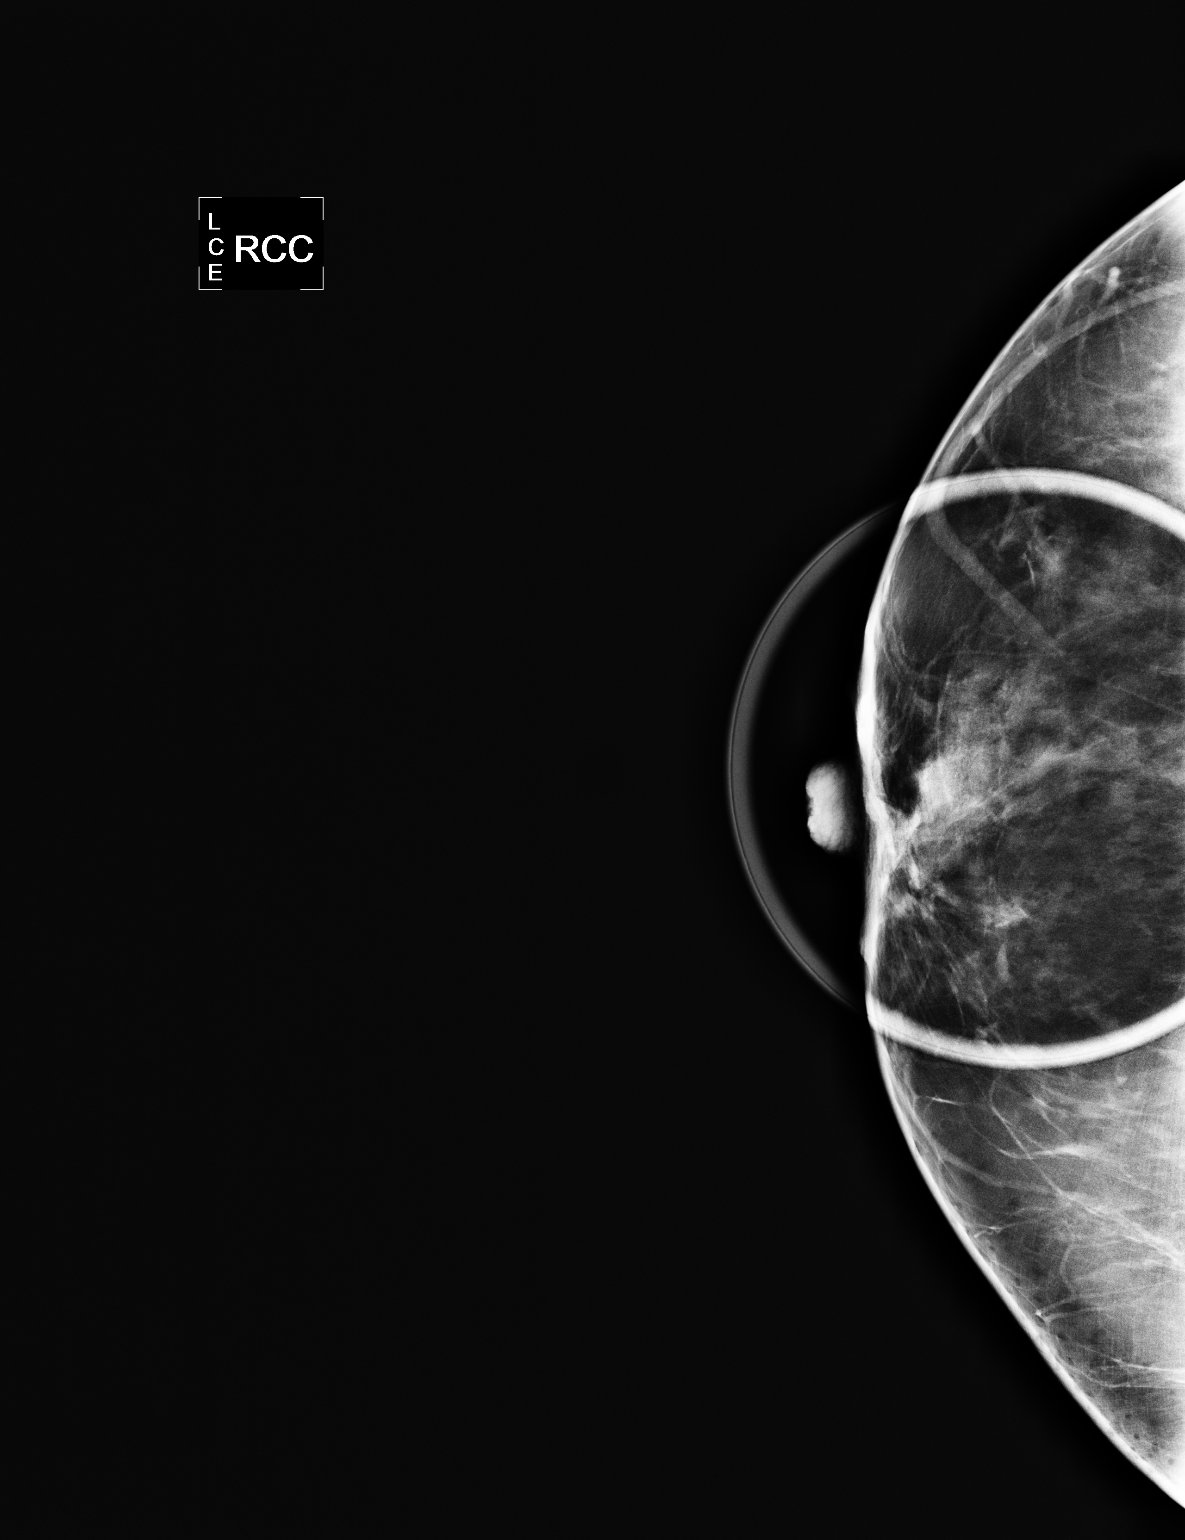

[R MLO]
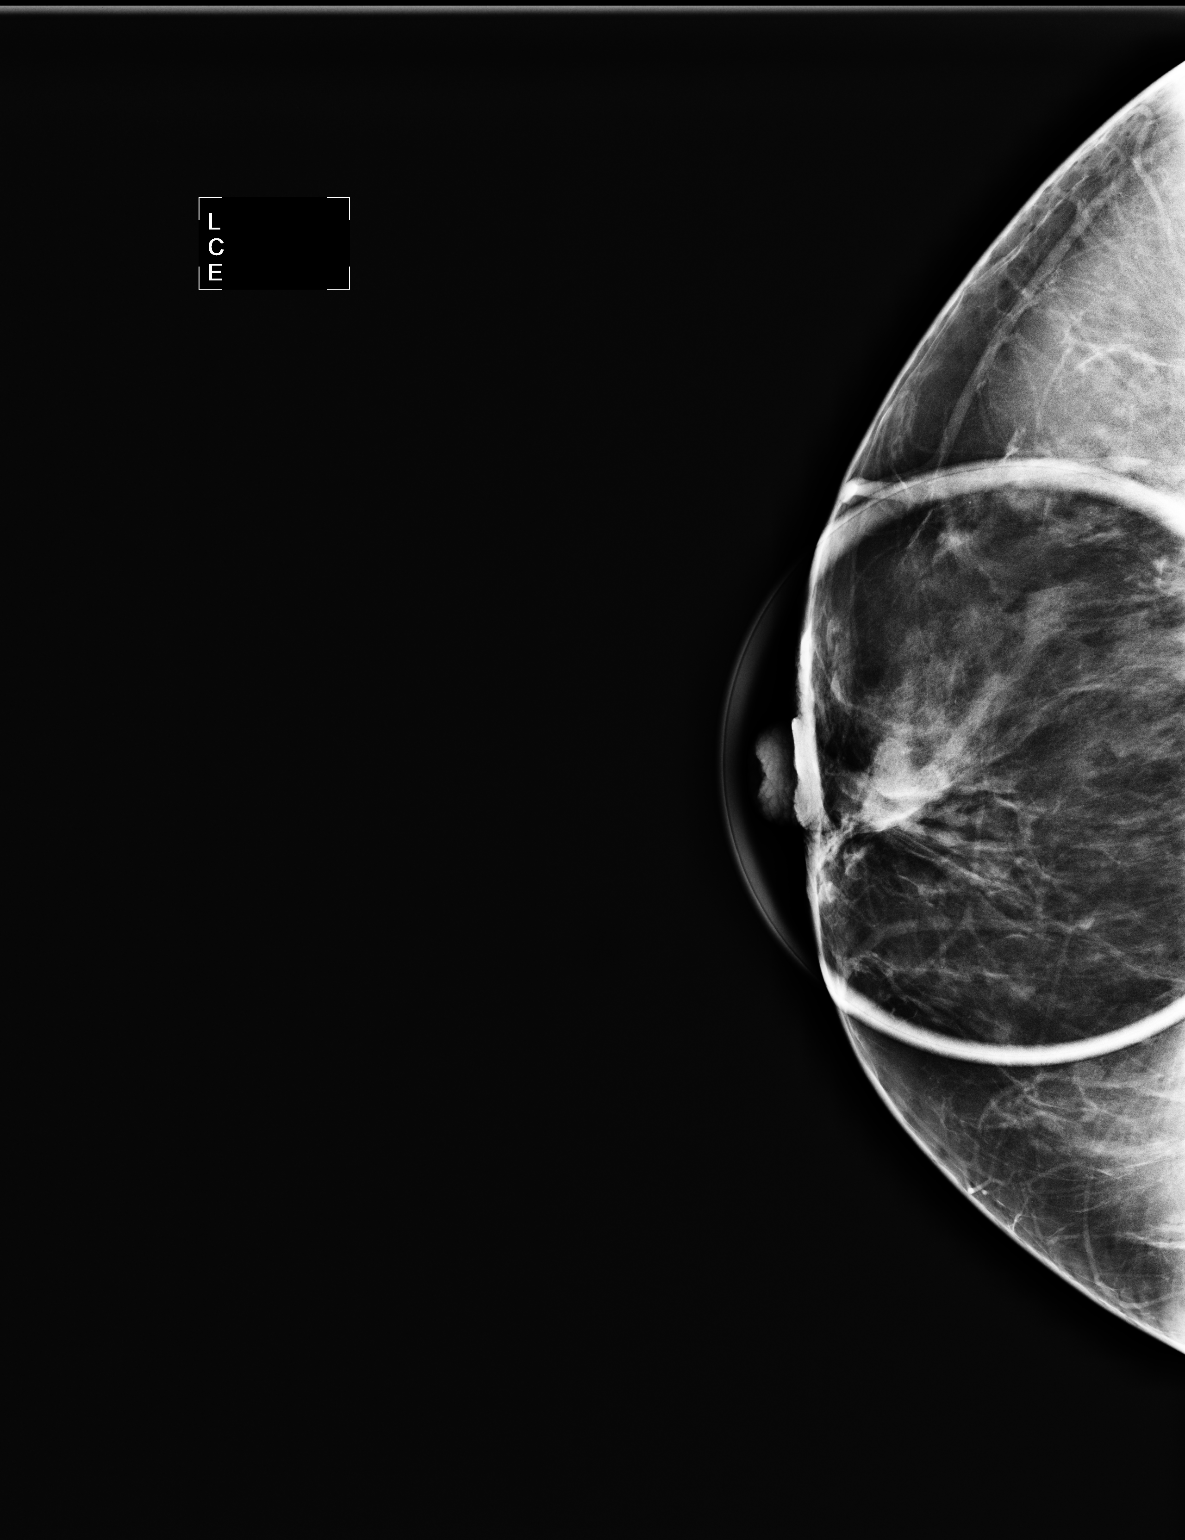

[2 of 2 positions shown; findings below may reference images not displayed]

FINDINGS: Spot compression CC and MLO views of the right breast
are submitted.  No suspicious mass is identified.  The retroareolar
right breast tissue is unchanged compared prior exams.
IMPRESSION: Benign findings.

RECOMMENDATION:
Routine screening mammogram back on schedule.

BI-RADS CATEGORY 2:  Benign finding(s).

## 2012-04-24 ENCOUNTER — Encounter: Payer: Self-pay | Admitting: Pulmonary Disease

## 2012-04-24 ENCOUNTER — Ambulatory Visit (INDEPENDENT_AMBULATORY_CARE_PROVIDER_SITE_OTHER)
Admission: RE | Admit: 2012-04-24 | Discharge: 2012-04-24 | Disposition: A | Discharge status: Home / Self Care | Payer: 59 | Source: Ambulatory Visit | Attending: Pulmonary Disease | Admitting: Pulmonary Disease

## 2012-04-24 ENCOUNTER — Ambulatory Visit (INDEPENDENT_AMBULATORY_CARE_PROVIDER_SITE_OTHER): Payer: 59 | Admitting: Pulmonary Disease

## 2012-04-24 VITALS — BP 110/80 | HR 91 | Temp 98.6°F | Ht 65.0 in | Wt 194.6 lb

## 2012-04-24 DIAGNOSIS — R0989 Other specified symptoms and signs involving the circulatory and respiratory systems: Secondary | ICD-10-CM

## 2012-04-24 DIAGNOSIS — Z0000 Encounter for general adult medical examination without abnormal findings: Secondary | ICD-10-CM

## 2012-04-24 DIAGNOSIS — R0600 Dyspnea, unspecified: Secondary | ICD-10-CM

## 2012-04-24 DIAGNOSIS — I2699 Other pulmonary embolism without acute cor pulmonale: Secondary | ICD-10-CM

## 2012-04-24 DIAGNOSIS — I279 Pulmonary heart disease, unspecified: Secondary | ICD-10-CM

## 2012-04-24 DIAGNOSIS — R0602 Shortness of breath: Secondary | ICD-10-CM

## 2012-04-24 IMAGING — CR DG CHEST 2V
2 series · 2 of 2 positions shown · non-contrast
Comparison: [DATE] and earlier.

CLINICAL DATA: 41-year-old female with shortness of breath.

CHEST - 2 VIEW

[view not recorded (1 of 2)]
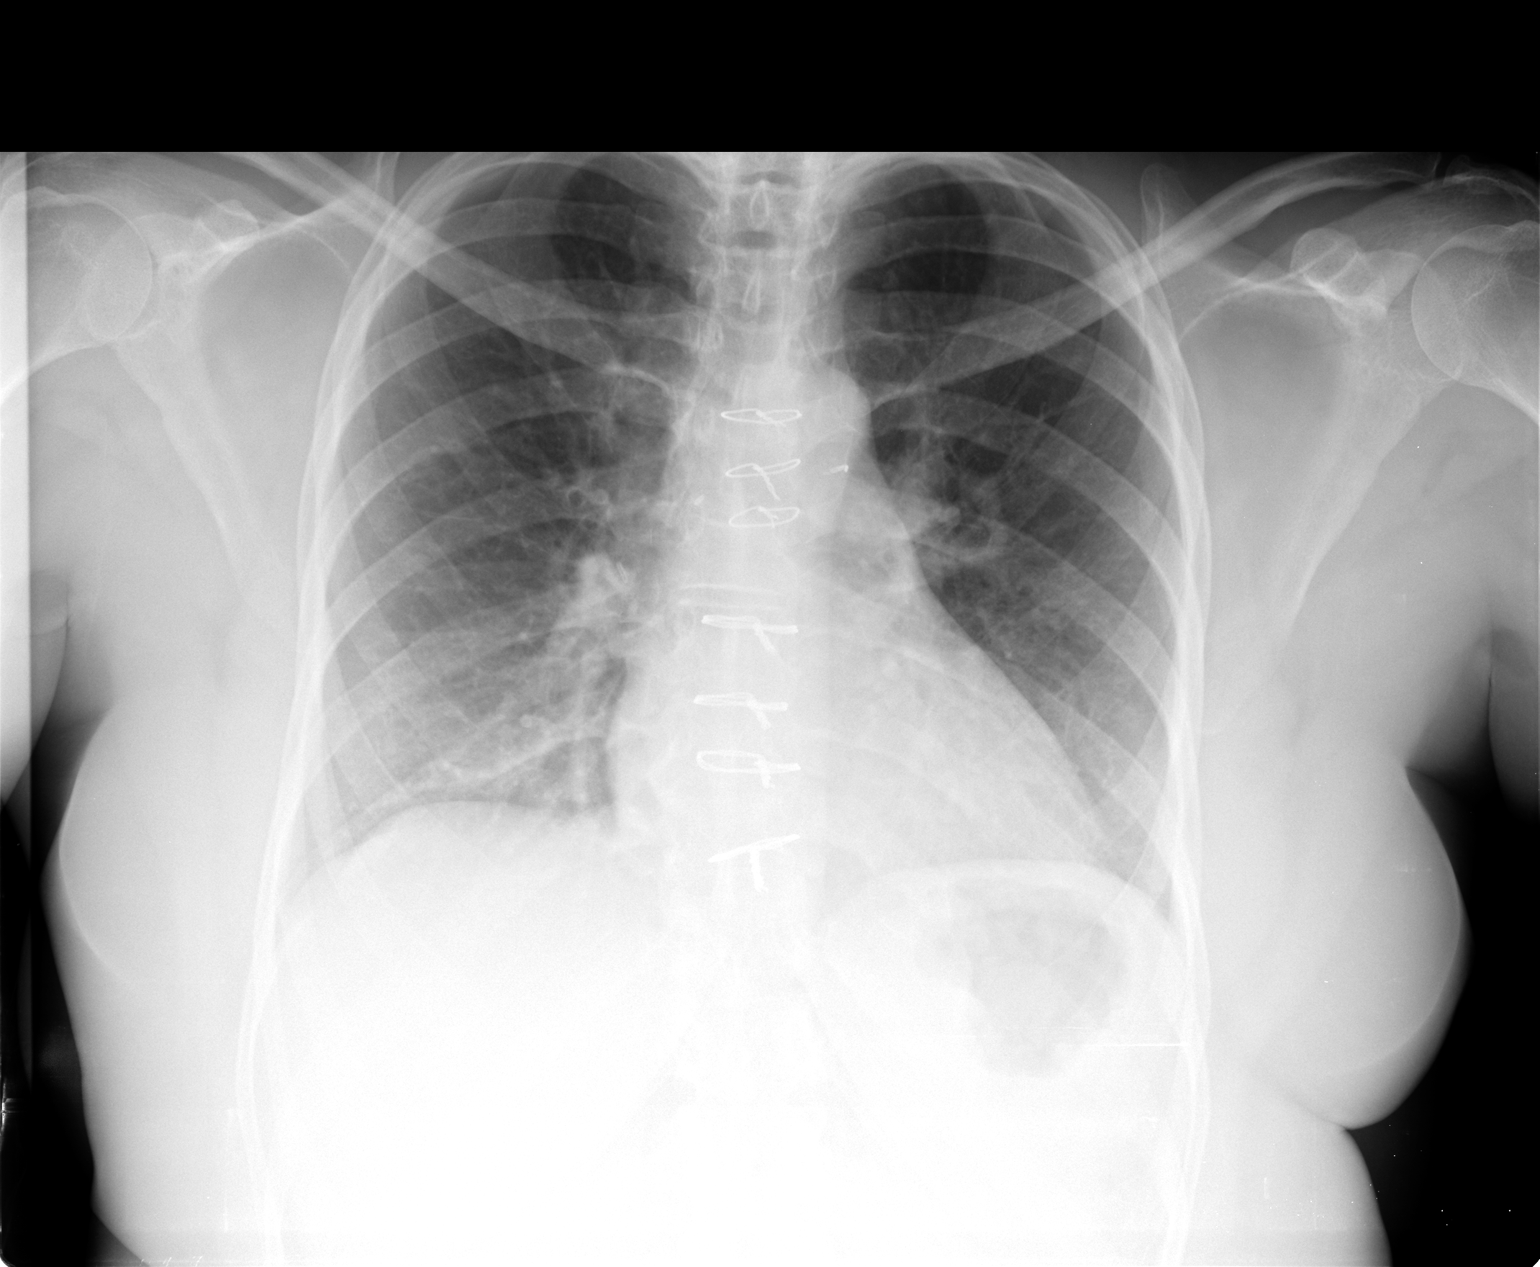

[view not recorded (2 of 2)]
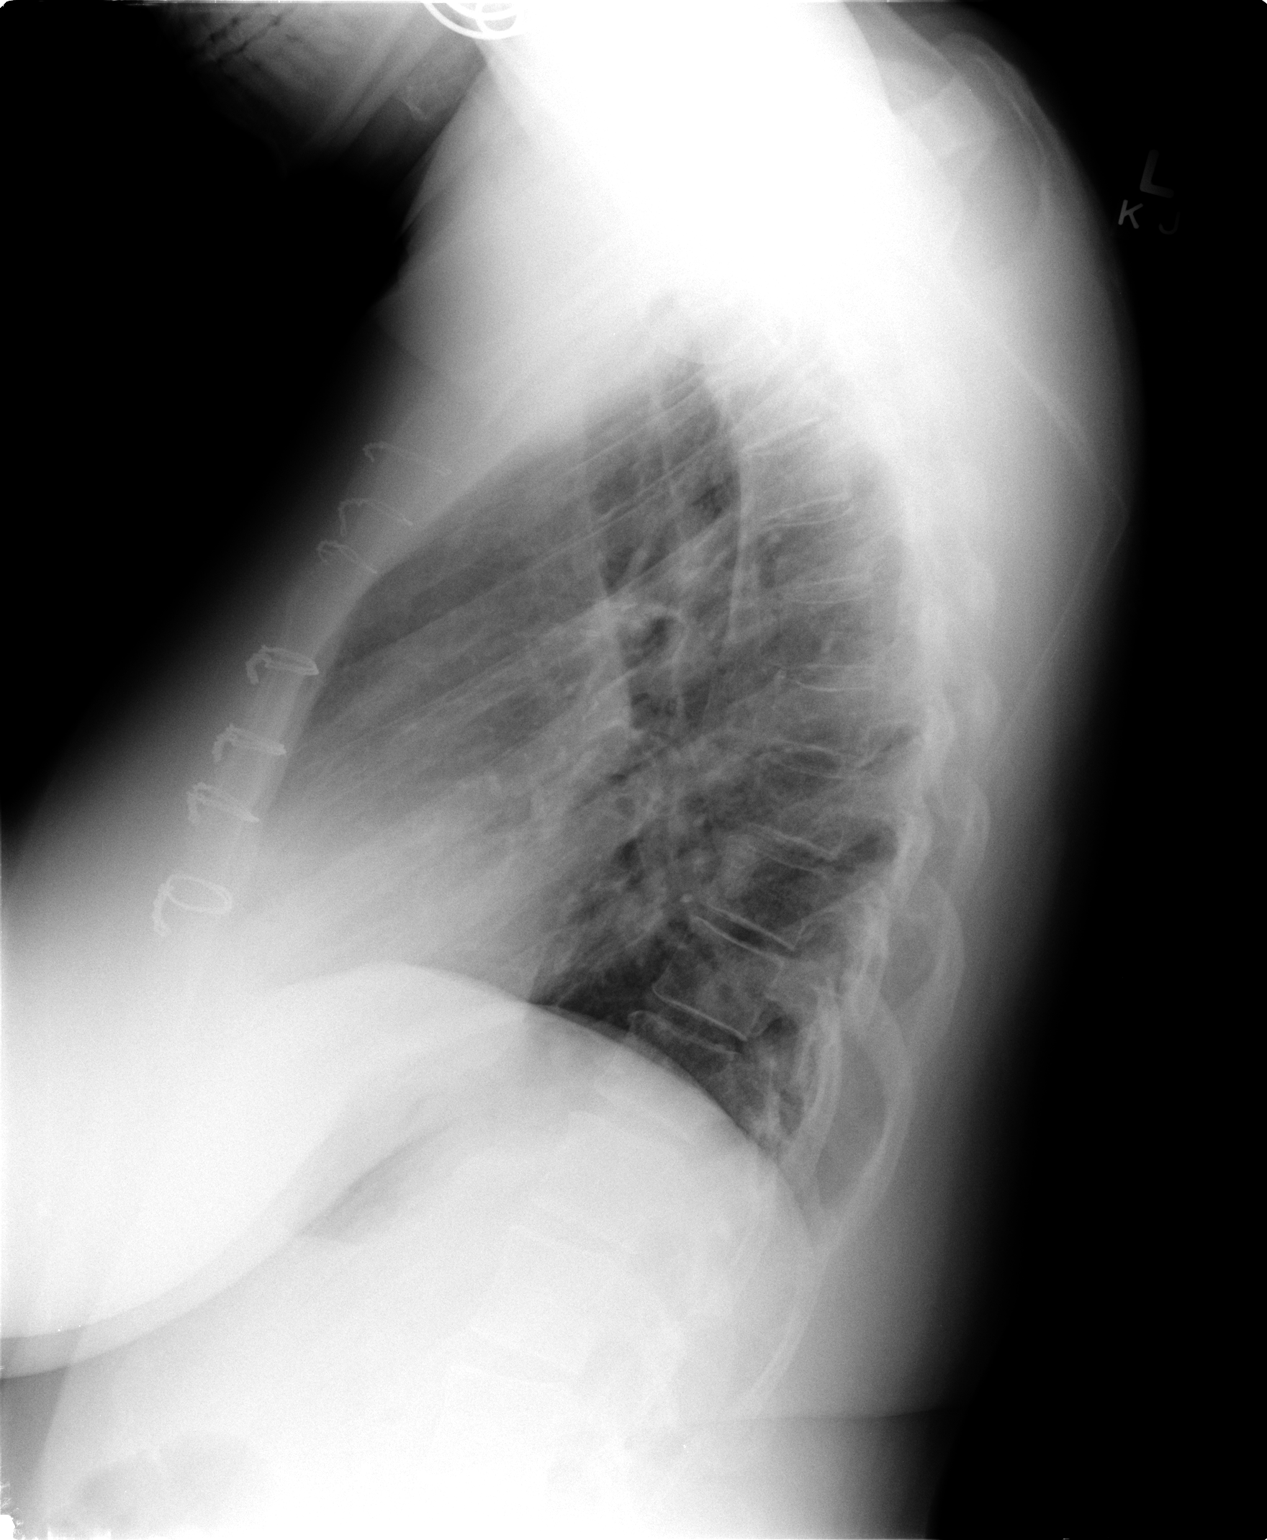

[2 of 2 positions shown; findings below may reference images not displayed]

FINDINGS: Stable sequelae of median sternotomy.  Stable lung
volumes.  Cardiac size and mediastinal contours are within normal
limits.  Visualized tracheal air column is within normal limits.
No pneumothorax or pleural effusion.  No pulmonary edema or acute
pulmonary opacity. No acute osseous abnormality identified.
IMPRESSION: No acute cardiopulmonary abnormality.

## 2012-04-24 NOTE — Progress Notes (Signed)
Chief Complaint  Patient presents with  . Pulmonary Consult    Pt referred by Dr Bland Span. for PE    History of Present Illness: [name] is a 41 y.o. female for evaluation of cough.  She was previously followed by Dr. Danice Goltz for pulmonary hypertension secondary to recurrent pulmonary embolism.  She was referred to Dr. Carolee Rota at [facility] for this in 2007.  She had pulmonary thrombo-endartectomy in 2007.  She has been on chronic anti-coagulation therapy, and was changed to xarelto in August 2013.  She developed worsening dyspnea, weakness, and dizziness over the Summer.  She was fasting for Ramadan, and she thinks much of her symptoms were related to her fast.  Once her fast was completed most of her symptoms resolved.    She also had a cough and chest tightness over the Summer.  She also had nasal congestion and headache.  She was seen in the ER and given prednisone and albuterol for a bronchitis.  She was using albuterol, and this helped.  Her cough and chest tightness have resolved, and she no longer needs to use albuterol.  She does not feel like she has trouble with her breathing at present.  She did not have any other doctors besides her physicians at [facility].  She therefore followed up with Dr. Bland Span.  She was advised that she needs to follow up with primary care.  However, she was referred back to pulmonary medicine instead.  She had most recently been seen in the IM teaching service clinic on 03/09/12.     Past Medical History  Diagnosis Date  . Pulmonary embolism 2005       . Pulmonary hypertension     2/2 chronic PE. Sees Dr. Carolee Rota, pulmonologist at [facility]  . Primary hypercoagulable state   . Hepatitis B infection     Hep B core Ab 04/2005  . IUD     Mirena  . DVT (deep venous thrombosis)   . Seasonal allergies     Past Surgical History  Procedure Date  . Pulmonary artery endarterectomy 2007  . Cardiac catheterization 11/2005    R heart cath : Severe  pulmonary arterial HTN with evidence of cor pulmonale. Vasodilatory challenge not performed.    Current Outpatient Prescriptions on File Prior to Visit  Medication Sig Dispense Refill  . acetaminophen (TYLENOL) 325 MG tablet Take 650 mg by mouth every 8 (eight) hours as needed. For pain      . albuterol (PROVENTIL HFA;VENTOLIN HFA) 108 (90 BASE) MCG/ACT inhaler Inhale 2 puffs into the lungs every 6 (six) hours as needed.      . cetirizine (ZYRTEC ALLERGY) 10 MG tablet Take 10 mg by mouth daily as needed. For allergies      . fluticasone (FLONASE) 50 MCG/ACT nasal spray Place 2 sprays into the nose daily.  16 g  3  . medroxyPROGESTERone (PROVERA) 5 MG tablet Take 1 tablet (5 mg total) by mouth daily.  21 tablet  0  . Rivaroxaban (XARELTO) 20 MG TABS Take 1 tablet (20 mg total) by mouth daily with supper.  90 tablet  12  . sodium chloride (OCEAN) 0.65 % SOLN nasal spray Place 1 spray into the nose every 6 (six) hours as needed. For dry nose      . spironolactone (ALDACTONE) 25 MG tablet Take 1 tablet (25 mg total) by mouth daily.  30 tablet  5    Allergies  Allergen Reactions  . Aspirin  Since being in [place], told she has an ulcer    Family History  Problem Relation Age of Onset  . Hypertension Mother   . Hypertension Father     deceased    History  Substance Use Topics  . Smoking status: Never Smoker   . Smokeless tobacco: Not on file  . Alcohol Use: No    Review of Systems  Constitutional: Negative for fever, chills, diaphoresis, activity change, appetite change, fatigue and unexpected weight change.  HENT: Positive for congestion, rhinorrhea ( and dry nose), sneezing, postnasal drip and sinus pressure. Negative for hearing loss, ear pain, nosebleeds, sore throat, facial swelling, mouth sores, trouble swallowing, neck pain, neck stiffness, dental problem, voice change, tinnitus and ear discharge.   Eyes: Negative for photophobia, discharge, itching and visual disturbance.    Respiratory: Positive for cough, chest tightness, shortness of breath and wheezing. Negative for apnea, choking and stridor.   Cardiovascular: Positive for chest pain ( middle chest-sternal region and back pain) and leg swelling. Negative for palpitations.  Gastrointestinal: Negative for nausea, vomiting, abdominal pain, constipation, blood in stool and abdominal distention.  Genitourinary: Negative for dysuria, urgency, frequency, hematuria, flank pain, decreased urine volume and difficulty urinating.  Musculoskeletal: Negative for myalgias, back pain, joint swelling, arthralgias and gait problem.  Skin: Negative for color change, pallor and rash.  Neurological: Positive for headaches. Negative for dizziness, tremors, seizures, syncope, speech difficulty, weakness, light-headedness and numbness.  Hematological: Negative for adenopathy. Does not bruise/bleed easily.  Psychiatric/Behavioral: Negative for confusion, disturbed wake/sleep cycle and agitation. The patient is not nervous/anxious.    Physical Exam: Filed Vitals:   04/24/12 1607  BP: 110/80  Pulse: 91  Temp: 98.6 F (37 C)  TempSrc: Oral  Height: 5\' 5"  (1.651 m)  Weight: 194 lb 9.6 oz (88.27 kg)  SpO2: 100%  ,  Current Encounter SPO2  04/24/12 1607 100%  03/09/12 1348 99%  02/28/12 1503 98%  02/28/12 1156 100%    Wt Readings from Last 3 Encounters:  04/24/12 194 lb 9.6 oz (88.27 kg)  04/10/12 190 lb 11.2 oz (86.501 kg)  03/09/12 194 lb 1.6 oz (88.043 kg)    Body mass index is 32.38 kg/(m^2).   General - No distress ENT - TM clear, no sinus tenderness, no oral exudate, no LAN, no thyromegaly Cardiac - s1s2 regular, no murmur, pulses symmetric, no edema Chest - normal respiratory excursion, good air entry, no wheeze/rales/dullness Back - no focal tenderness Abd - soft, non-tender, no organomegaly, + bowel sounds Ext - normal motor strength Neuro - Cranial nerves are normal. PERLA. EOM's intact. Skin - no  discernible active dermatitis, erythema, urticaria or inflammatory process. Psych - normal mood, and behavior.   Lab Results  Component Value Date   WBC 5.7 03/09/2012   HGB 14.7 03/09/2012   HCT 42.2 03/09/2012   MCV 84.9 03/09/2012   PLT 251 03/09/2012    Lab Results  Component Value Date   CREATININE 0.95 02/28/2012   BUN 9 02/28/2012   NA 138 02/28/2012   K 4.0 02/28/2012   CL 103 02/28/2012   CO2 26 02/28/2012    Lab Results  Component Value Date   ALT 11 05/24/2011   AST 20 05/24/2011   ALKPHOS 67 05/24/2011   BILITOT 0.4 05/24/2011    Lab Results  Component Value Date   TSH 0.985 03/09/2012    BNP    Component Value Date/Time   PROBNP 85.4 02/28/2012 1426  Assessment/Plan:  Coralyn Helling, MD [facility] Pager:  [phone] 05/02/2012, 3:25 PM

## 2012-04-24 NOTE — Progress Notes (Deleted)
  Subjective:    Patient ID: [name], female    DOB: [birth year], 41 y.o.   MRN: [id number]  HPI    Review of Systems  Constitutional: Negative for fever, chills, diaphoresis, activity change, appetite change, fatigue and unexpected weight change.  HENT: Positive for congestion, rhinorrhea ( and dry nose), sneezing, postnasal drip and sinus pressure. Negative for hearing loss, ear pain, nosebleeds, sore throat, facial swelling, mouth sores, trouble swallowing, neck pain, neck stiffness, dental problem, voice change, tinnitus and ear discharge.   Eyes: Negative for photophobia, discharge, itching and visual disturbance.  Respiratory: Positive for cough, chest tightness, shortness of breath and wheezing. Negative for apnea, choking and stridor.   Cardiovascular: Positive for chest pain ( middle chest-sternal region and back pain) and leg swelling. Negative for palpitations.  Gastrointestinal: Negative for nausea, vomiting, abdominal pain, constipation, blood in stool and abdominal distention.  Genitourinary: Negative for dysuria, urgency, frequency, hematuria, flank pain, decreased urine volume and difficulty urinating.  Musculoskeletal: Negative for myalgias, back pain, joint swelling, arthralgias and gait problem.  Skin: Negative for color change, pallor and rash.  Neurological: Positive for headaches. Negative for dizziness, tremors, seizures, syncope, speech difficulty, weakness, light-headedness and numbness.  Hematological: Negative for adenopathy. Does not bruise/bleed easily.  Psychiatric/Behavioral: Negative for confusion, disturbed wake/sleep cycle and agitation. The patient is not nervous/anxious.        Objective:   Physical Exam        Assessment & Plan:

## 2012-04-24 NOTE — Patient Instructions (Signed)
Chest xray today>>will call with results Will schedule breathing test (PFT) >> will call with results Will arrange for new primary care doctor Follow up in 6 months

## 2012-04-25 ENCOUNTER — Telehealth: Payer: Self-pay | Admitting: Pulmonary Disease

## 2012-04-25 NOTE — Telephone Encounter (Signed)
I spoke with patient about results and she verbalized understanding and had no questions 

## 2012-04-25 NOTE — Telephone Encounter (Signed)
Dg Chest 2 View  04/24/2012  *RADIOLOGY REPORT*  Clinical Data: 41 year old female with shortness of breath.  CHEST - 2 VIEW  Comparison: 02/28/2012 and earlier.  Findings: Stable sequelae of median sternotomy.  Stable lung volumes.  Cardiac size and mediastinal contours are within normal limits.  Visualized tracheal air column is within normal limits. No pneumothorax or pleural effusion.  No pulmonary edema or acute pulmonary opacity. No acute osseous abnormality identified.  IMPRESSION: No acute cardiopulmonary abnormality.   Original Report Authenticated By: Harley Hallmark, M.D.     Will have my nurse inform pt that CXR looks better.  Changes from Bronchitis on CXR July 29 have resolved.  No change to treatment plan.

## 2012-05-02 ENCOUNTER — Encounter: Payer: Self-pay | Admitting: Pulmonary Disease

## 2012-05-02 NOTE — Assessment & Plan Note (Signed)
She is on chronic anti-coagulation with Xarelto.

## 2012-05-02 NOTE — Assessment & Plan Note (Signed)
She is concerned about the inconsistence of her care with medicine teaching clinic, and requesting referral to new primary care provider.  Will have my coordinator assist with this.

## 2012-05-02 NOTE — Assessment & Plan Note (Signed)
She had episode of bronchitis over the Summer, but has improved clinically.  Will arrange for chest xray and PFT to further assess.

## 2012-05-23 ENCOUNTER — Emergency Department (HOSPITAL_COMMUNITY): Payer: 59

## 2012-05-23 ENCOUNTER — Emergency Department (HOSPITAL_COMMUNITY)
Admission: EM | Admit: 2012-05-23 | Discharge: 2012-05-24 | Disposition: A | Discharge status: Home / Self Care | Payer: 59 | Attending: Emergency Medicine | Admitting: Emergency Medicine

## 2012-05-23 DIAGNOSIS — J3489 Other specified disorders of nose and nasal sinuses: Secondary | ICD-10-CM | POA: Insufficient documentation

## 2012-05-23 DIAGNOSIS — R059 Cough, unspecified: Secondary | ICD-10-CM | POA: Insufficient documentation

## 2012-05-23 DIAGNOSIS — R05 Cough: Secondary | ICD-10-CM | POA: Insufficient documentation

## 2012-05-23 DIAGNOSIS — B349 Viral infection, unspecified: Secondary | ICD-10-CM

## 2012-05-23 DIAGNOSIS — Z8619 Personal history of other infectious and parasitic diseases: Secondary | ICD-10-CM | POA: Insufficient documentation

## 2012-05-23 DIAGNOSIS — J029 Acute pharyngitis, unspecified: Secondary | ICD-10-CM | POA: Insufficient documentation

## 2012-05-23 DIAGNOSIS — B9789 Other viral agents as the cause of diseases classified elsewhere: Secondary | ICD-10-CM | POA: Insufficient documentation

## 2012-05-23 DIAGNOSIS — Z886 Allergy status to analgesic agent status: Secondary | ICD-10-CM | POA: Insufficient documentation

## 2012-05-23 DIAGNOSIS — Z79899 Other long term (current) drug therapy: Secondary | ICD-10-CM | POA: Insufficient documentation

## 2012-05-23 DIAGNOSIS — Z86711 Personal history of pulmonary embolism: Secondary | ICD-10-CM | POA: Insufficient documentation

## 2012-05-23 DIAGNOSIS — I2789 Other specified pulmonary heart diseases: Secondary | ICD-10-CM | POA: Insufficient documentation

## 2012-05-23 DIAGNOSIS — J309 Allergic rhinitis, unspecified: Secondary | ICD-10-CM | POA: Insufficient documentation

## 2012-05-23 DIAGNOSIS — Z7901 Long term (current) use of anticoagulants: Secondary | ICD-10-CM | POA: Insufficient documentation

## 2012-05-23 DIAGNOSIS — Z86718 Personal history of other venous thrombosis and embolism: Secondary | ICD-10-CM | POA: Insufficient documentation

## 2012-05-23 DIAGNOSIS — R0602 Shortness of breath: Secondary | ICD-10-CM | POA: Insufficient documentation

## 2012-05-23 DIAGNOSIS — R0682 Tachypnea, not elsewhere classified: Secondary | ICD-10-CM | POA: Insufficient documentation

## 2012-05-23 DIAGNOSIS — IMO0001 Reserved for inherently not codable concepts without codable children: Secondary | ICD-10-CM | POA: Insufficient documentation

## 2012-05-23 DIAGNOSIS — R110 Nausea: Secondary | ICD-10-CM | POA: Insufficient documentation

## 2012-05-23 LAB — COMPREHENSIVE METABOLIC PANEL
ALT: 17 U/L (ref 0–35)
AST: 26 U/L (ref 0–37)
Alkaline Phosphatase: 67 U/L (ref 39–117)
CO2: 27 mEq/L (ref 19–32)
Chloride: 100 mEq/L (ref 96–112)
GFR calc Af Amer: 79 mL/min — ABNORMAL LOW (ref >90)
GFR calc non Af Amer: 68 mL/min — ABNORMAL LOW (ref >90)
Glucose, Bld: 92 mg/dL (ref 70–99)
Potassium: 3.6 mEq/L (ref 3.5–5.1)
Sodium: 136 mEq/L (ref 135–145)

## 2012-05-23 LAB — CBC WITH DIFFERENTIAL/PLATELET
Basophils Absolute: 0.1 10*3/uL (ref 0.0–0.1)
Lymphocytes Relative: 33 % (ref 12–46)
Lymphs Abs: 1.4 10*3/uL (ref 0.7–4.0)
MCV: 86.5 fL (ref 78.0–100.0)
Neutro Abs: 1.4 10*3/uL — ABNORMAL LOW (ref 1.7–7.7)
Neutrophils Relative %: 34 % — ABNORMAL LOW (ref 43–77)
Platelets: 202 10*3/uL (ref 150–400)
RBC: 5.05 MIL/uL (ref 3.87–5.11)
WBC: 4.2 10*3/uL (ref 4.0–10.5)

## 2012-05-23 LAB — GLUCOSE, CAPILLARY: Glucose-Capillary: 113 mg/dL — ABNORMAL HIGH (ref 70–99)

## 2012-05-23 IMAGING — CR DG CHEST 2V
2 series · 2 of 2 positions shown · non-contrast
Comparison: [DATE]

CLINICAL DATA: Shortness of breath

CHEST - 2 VIEW

[w chest pa]
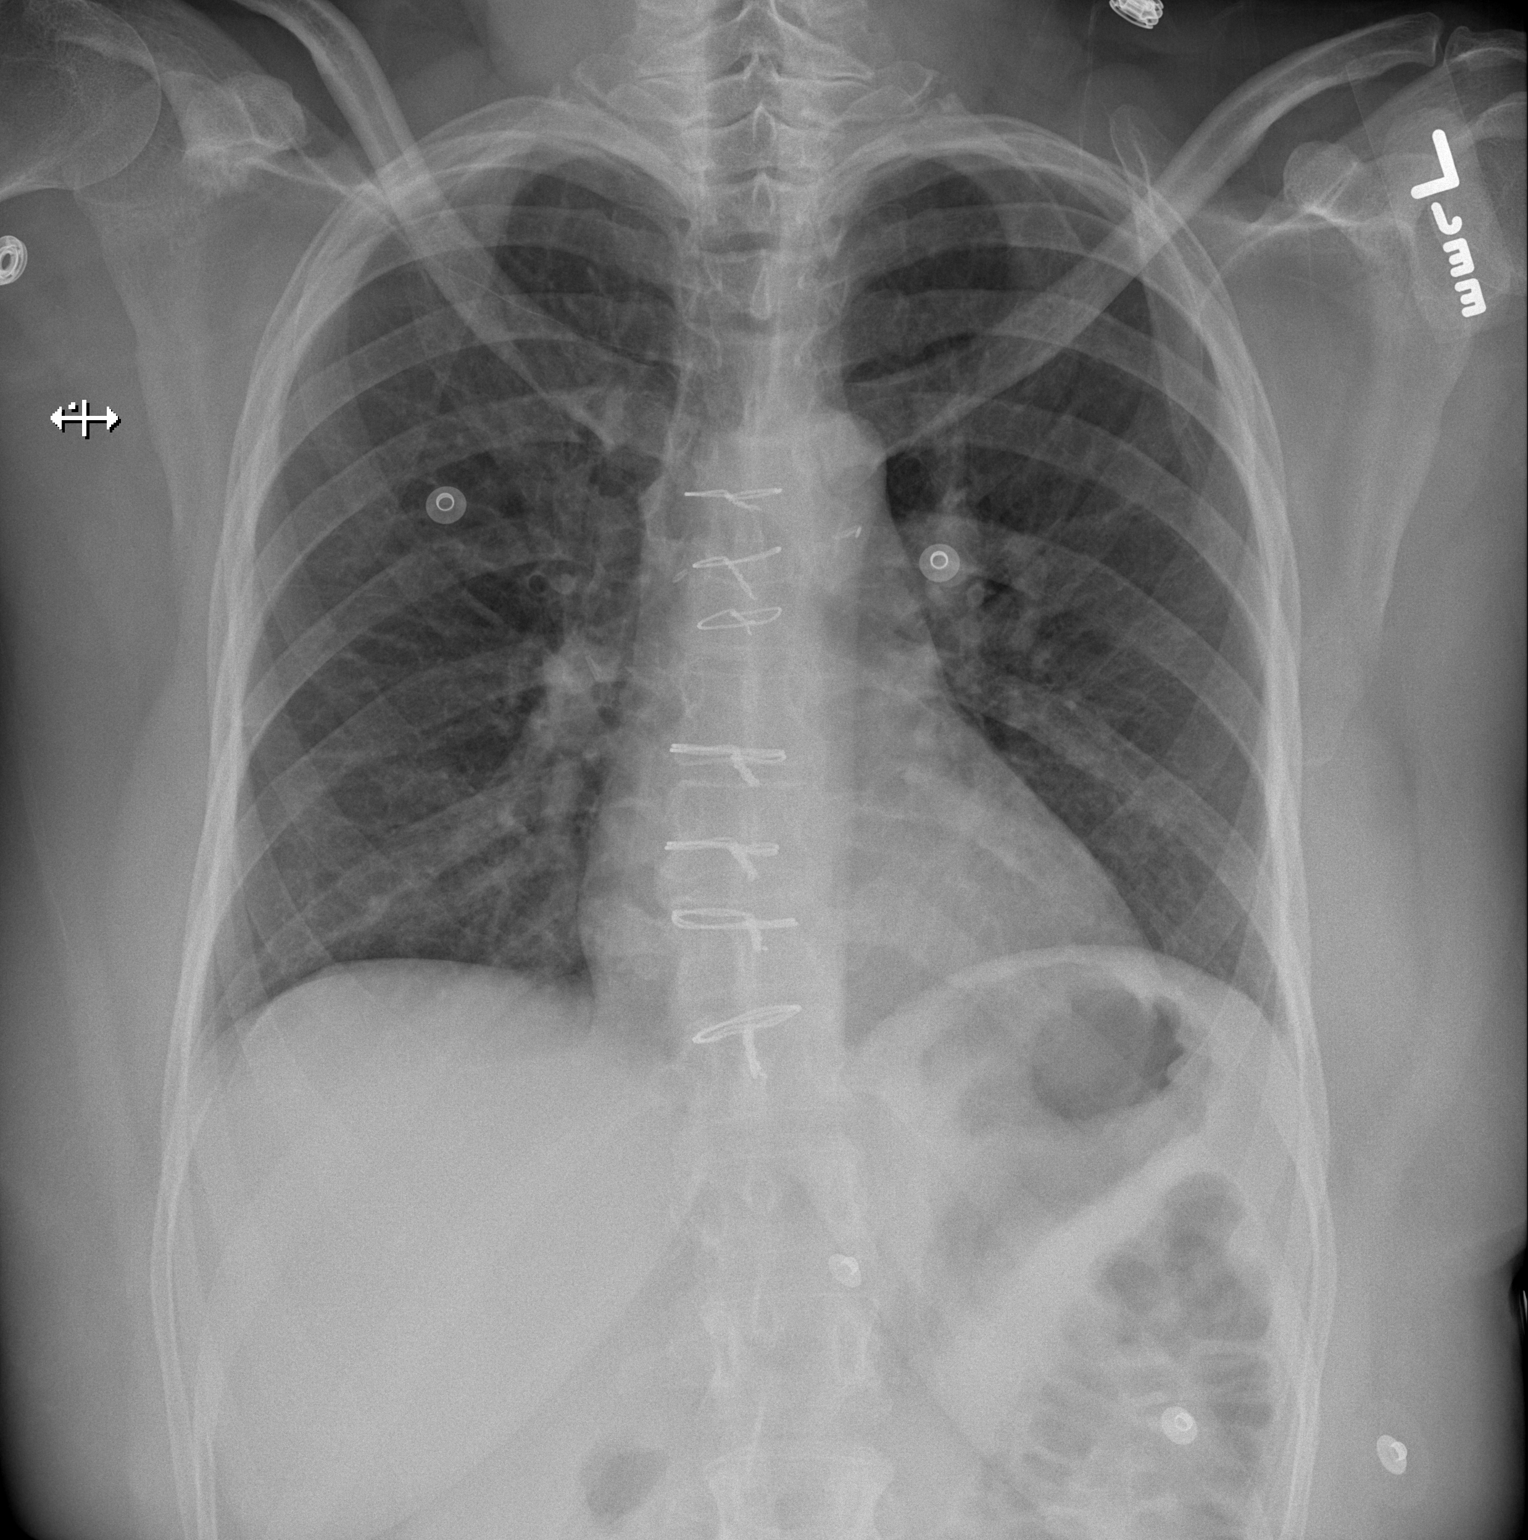

[w chest lat]
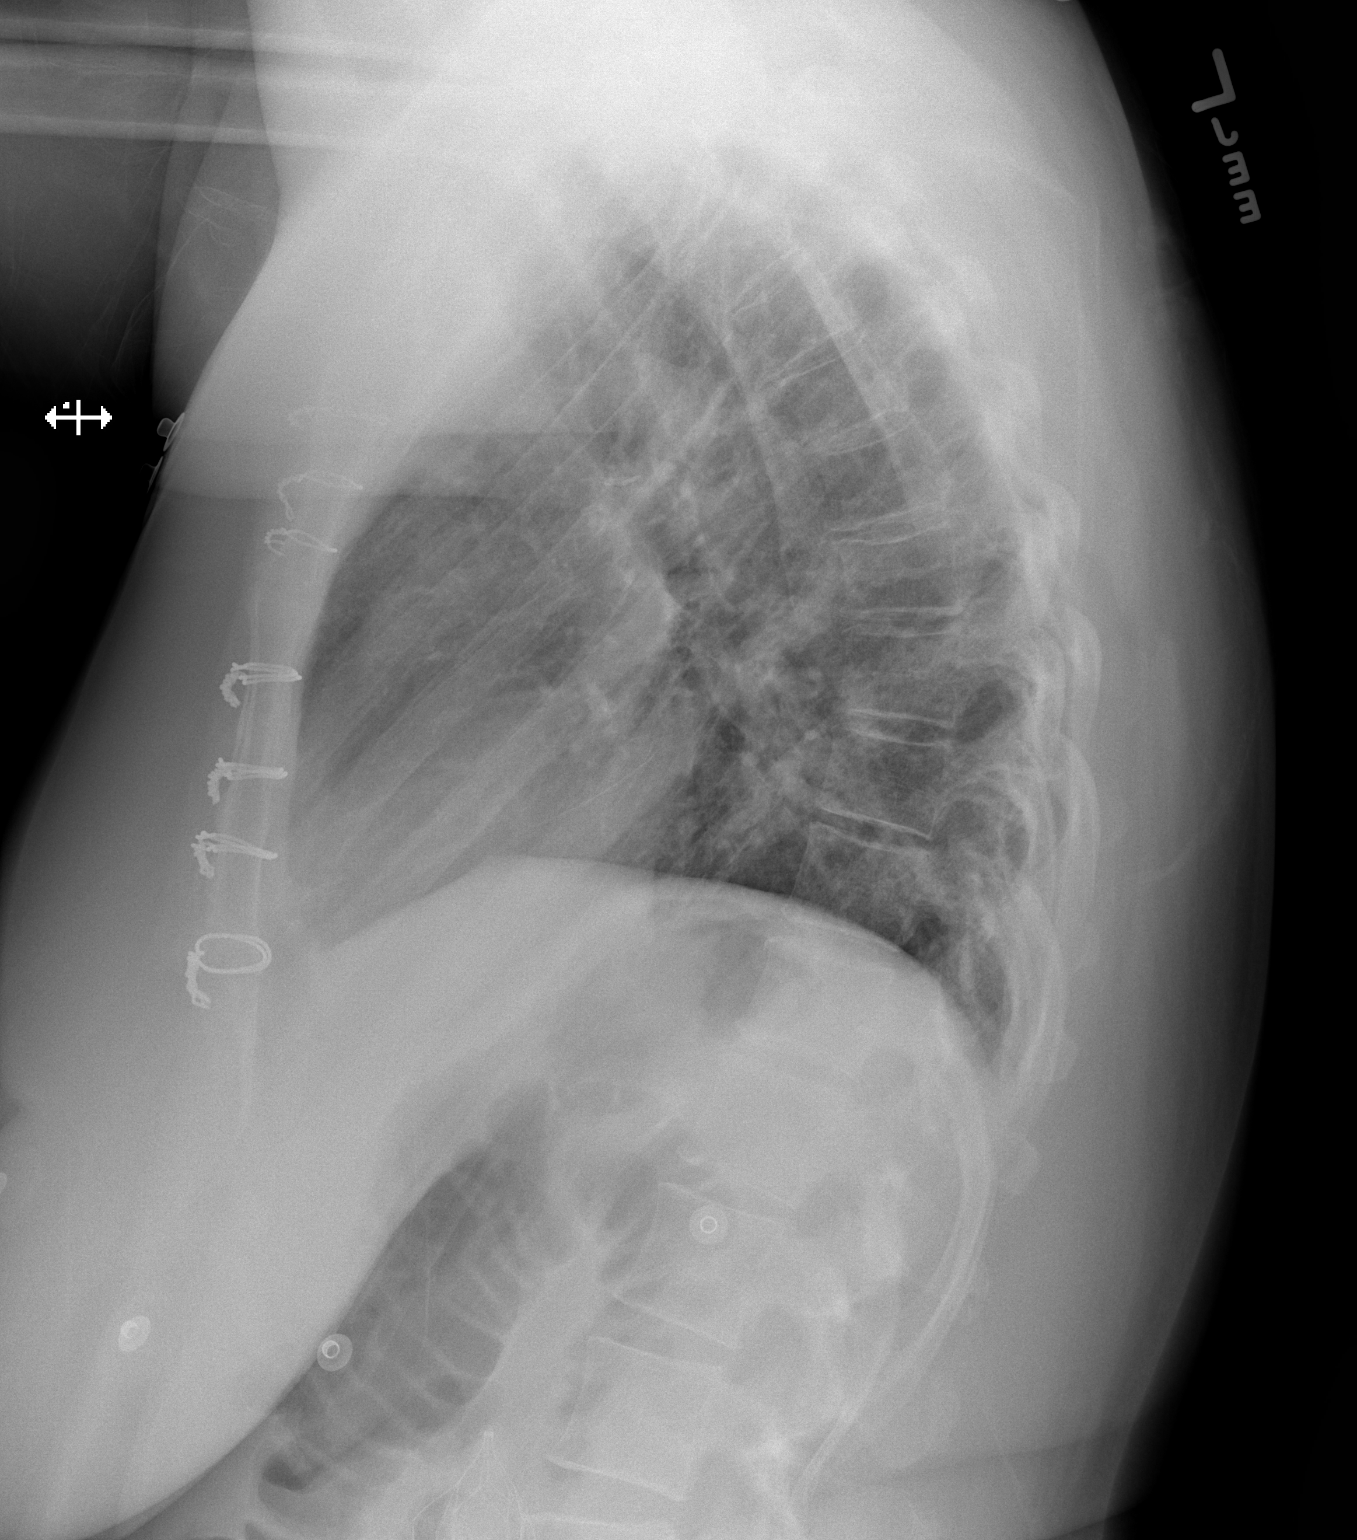

[2 of 2 positions shown; findings below may reference images not displayed]

FINDINGS: Hypoaeration with interstitial and vascular crowding.
Hemidiaphragm elevation.  Status post median sternotomy and PDA
ligation.  Heart size and mediastinal contours are within normal
range.  No confluent airspace opacity, pleural effusion, or
pneumothorax.  No overt edema.  No acute osseous finding.
IMPRESSION: Stable surgical changes.  No radiographic evidence of acute
cardiopulmonary process.

## 2012-05-23 MED ORDER — SODIUM CHLORIDE 0.9 % IV BOLUS (SEPSIS)
1000.0000 mL | Freq: Once | INTRAVENOUS | Status: AC
  Administered 2012-05-23: 1000 mL via INTRAVENOUS

## 2012-05-23 MED ORDER — DEXAMETHASONE SODIUM PHOSPHATE 10 MG/ML IJ SOLN
10.0000 mg | Freq: Once | INTRAMUSCULAR | Status: AC
  Administered 2012-05-23: 10 mg via INTRAVENOUS
  Filled 2012-05-23: qty 1

## 2012-05-23 MED ORDER — KETOROLAC TROMETHAMINE 30 MG/ML IJ SOLN
30.0000 mg | Freq: Once | INTRAMUSCULAR | Status: AC
  Administered 2012-05-23: 30 mg via INTRAVENOUS
  Filled 2012-05-23: qty 1

## 2012-05-23 MED ORDER — OXYMETAZOLINE HCL 0.05 % NA SOLN
1.0000 | Freq: Once | NASAL | Status: AC
  Administered 2012-05-23: 1 via NASAL
  Filled 2012-05-23: qty 15

## 2012-05-23 NOTE — ED Provider Notes (Signed)
History     CSN: [id number]  Arrival date & time 05/23/12  2052   First MD Initiated Contact with Patient 05/23/12 2114      Chief Complaint  Patient presents with  . Shortness of Breath     HPI  The patient presents with multiple complaints.  She states that 3 days ago she gradually developed her symptoms.  Since onset she has had diffuse discomfort, described as a soreness in all muscles, joints.  She also notes particular soreness in her inferior axillary regions, when she has pronounced coughing episodes.  She complains of ongoing congestion, subjective fever, facial fullness, sore throat. She notes some improvement with Ocean nasal spray, Zyrtec. She denies dyspnea, other specific chest pain.  She also denies any change in the size of her lower extremity. The patient has a history of PE, with prior pulmonary artery endarterectomy, with filter placement.  She currently takes rofecoxib and for anticoagulation, and states that she is diligently compliant.   Past Medical History  Diagnosis Date  . Pulmonary embolism 2005       . Pulmonary hypertension     2/2 chronic PE. Sees Dr. Carolee Rota, pulmonologist at [facility]  . Primary hypercoagulable state   . Hepatitis B infection     Hep B core Ab 04/2005  . IUD     Mirena  . DVT (deep venous thrombosis)   . Seasonal allergies     Past Surgical History  Procedure Date  . Pulmonary artery endarterectomy 2007  . Cardiac catheterization 11/2005    R heart cath : Severe pulmonary arterial HTN with evidence of cor pulmonale. Vasodilatory challenge not performed.    Family History  Problem Relation Age of Onset  . Hypertension Mother   . Hypertension Father     deceased    History  Substance Use Topics  . Smoking status: Never Smoker   . Smokeless tobacco: Not on file  . Alcohol Use: No    OB History    Grav Para Term Preterm Abortions TAB SAB Ect Mult Living   4 3 3  1  1   3       Review of Systems    Constitutional:       Per HPI, otherwise negative  HENT:       Per HPI, otherwise negative  Eyes: Negative.   Respiratory:       Per HPI, otherwise negative  Cardiovascular:       Per HPI, otherwise negative  Gastrointestinal: Positive for nausea. Negative for vomiting and diarrhea.  Genitourinary: Negative.   Musculoskeletal:       Per HPI, otherwise negative  Skin: Negative.   Neurological: Negative for syncope and weakness.    Allergies  Aspirin  Home Medications   Current Outpatient Rx  Name Route Sig Dispense Refill  . ACETAMINOPHEN 325 MG PO TABS Oral Take 650 mg by mouth every 8 (eight) hours as needed. For pain    . ALBUTEROL SULFATE HFA 108 (90 BASE) MCG/ACT IN AERS Inhalation Inhale 2 puffs into the lungs every 6 (six) hours as needed. For shortness of breath or wheezing    . CETIRIZINE HCL 10 MG PO TABS Oral Take 10 mg by mouth every morning. For allergies    . FLUTICASONE PROPIONATE 50 MCG/ACT NA SUSP Nasal Place 2 sprays into the nose daily. 16 g 3  . ADULT MULTIVITAMIN W/MINERALS CH Oral Take 1 tablet by mouth daily. Centrum    . RIVAROXABAN 20  MG PO TABS Oral Take 20 mg by mouth every morning.    Marland Kitchen SALINE 0.65 % NA SOLN Nasal Place 1 spray into the nose every 6 (six) hours as needed. For dry nose    . SPIRONOLACTONE 25 MG PO TABS Oral Take 25 mg by mouth every morning.      BP 119/87  Pulse 93  Temp 98.4 F (36.9 C) (Oral)  Resp 21  SpO2 99%  Physical Exam  Nursing note and vitals reviewed. Constitutional: She is oriented to person, place, and time. She appears well-developed and well-nourished. No distress.  HENT:  Head: Normocephalic and atraumatic.  Eyes: Conjunctivae normal and EOM are normal.  Cardiovascular: Normal rate and regular rhythm.   Pulmonary/Chest: Breath sounds normal. No stridor. Tachypnea noted. No respiratory distress.    Abdominal: She exhibits no distension.  Musculoskeletal: She exhibits no edema.  Neurological: She is alert  and oriented to person, place, and time. No cranial nerve deficit.  Skin: Skin is warm and dry.  Psychiatric: She has a normal mood and affect.    ED Course  Procedures (including critical care time)  Labs Reviewed  GLUCOSE, CAPILLARY - Abnormal; Notable for the following:    Glucose-Capillary 113 (*)     All other components within normal limits  CBC WITH DIFFERENTIAL - Abnormal; Notable for the following:    Hemoglobin 15.4 (*)     Neutrophils Relative 34 (*)     Neutro Abs 1.4 (*)     Monocytes Relative 31 (*)     Monocytes Absolute 1.3 (*)     All other components within normal limits  COMPREHENSIVE METABOLIC PANEL   Dg Chest 2 View  05/23/2012  *RADIOLOGY REPORT*  Clinical Data: Shortness of breath  CHEST - 2 VIEW  Comparison: 04/24/2012  Findings: Hypoaeration with interstitial and vascular crowding. Hemidiaphragm elevation.  Status post median sternotomy and PDA ligation.  Heart size and mediastinal contours are within normal range.  No confluent airspace opacity, pleural effusion, or pneumothorax.  No overt edema.  No acute osseous finding.  IMPRESSION: Stable surgical changes.  No radiographic evidence of acute cardiopulmonary process.   Original Report Authenticated By: Waneta Martins, M.D.      No diagnosis found.  O2: 99% ra, normal  11:50 PM Patient significantly more comfortable.  She is resting comfortably  MDM  This patient with a history of chronic PE, now anticoagulated with Roxanne, with a filter in place presents with diffuse discomfort, facial fullness and rhinorrhea.  The constellation of symptoms is suggestive of viral illness, though given her history x-ray was performed, as well as labs.  X-ray does not demonstrate pneumonia.  Labs are reassuring, and the patient is afebrile.  The patient has substantial reduction in her symptoms with fluids, Decadron, Toradol, nasal spray.  Given this improvement, the absence of distress, her capacity to follow up with  her pulmonologist, she was discharged in stable condition.      Gerhard Munch, MD 05/24/12 0010

## 2012-05-23 NOTE — ED Notes (Signed)
EMS-pt reports sudden onset of SOB approx 30 min ago, pt with hx of PE. Pt placed on NRB for comfort. EKG unremarkable. 20g(L)hand. Vitals wnl.

## 2012-05-24 NOTE — ED Notes (Signed)
Pt discharged.Vital signs stable and GCS 15 

## 2012-06-14 ENCOUNTER — Other Ambulatory Visit: Payer: Self-pay | Admitting: Internal Medicine

## 2012-06-14 NOTE — Telephone Encounter (Signed)
Transferring care to [facility] so gave one month only.

## 2012-06-26 ENCOUNTER — Ambulatory Visit (INDEPENDENT_AMBULATORY_CARE_PROVIDER_SITE_OTHER): Payer: 59 | Admitting: Internal Medicine

## 2012-06-26 ENCOUNTER — Other Ambulatory Visit: Payer: 59

## 2012-06-26 ENCOUNTER — Encounter: Payer: Self-pay | Admitting: Internal Medicine

## 2012-06-26 VITALS — BP 120/88 | HR 92 | Temp 97.5°F | Resp 16 | Ht 64.0 in | Wt 196.8 lb

## 2012-06-26 DIAGNOSIS — L0291 Cutaneous abscess, unspecified: Secondary | ICD-10-CM

## 2012-06-26 DIAGNOSIS — Z23 Encounter for immunization: Secondary | ICD-10-CM

## 2012-06-26 NOTE — Progress Notes (Signed)
  Subjective:    Patient ID: [name], female    DOB: 09-Aug-1970, 41 y.o.   MRN: [id number]  HPI  New to me she complains of a painful, enlarging cyst over her left flank over the last few months. She had an I and D done of this area in 2006 in [place].  Review of Systems  Constitutional: Negative for fever, chills, diaphoresis, activity change, appetite change, fatigue and unexpected weight change.  HENT: Negative.   Eyes: Negative.   Respiratory: Negative.   Cardiovascular: Negative.   Gastrointestinal: Negative.   Genitourinary: Negative.   Musculoskeletal: Negative.   Skin: Positive for wound. Negative for color change, pallor and rash.  Neurological: Negative.   Hematological: Negative.   Psychiatric/Behavioral: Negative.        Objective:   Physical Exam  Vitals reviewed. Constitutional: She is oriented to person, place, and time. She appears well-developed and well-nourished. No distress.  HENT:  Head: Normocephalic and atraumatic.  Mouth/Throat: Oropharynx is clear and moist. No oropharyngeal exudate.  Eyes: Conjunctivae normal are normal. Right eye exhibits no discharge. Left eye exhibits no discharge. No scleral icterus.  Neck: Normal range of motion. Neck supple. No JVD present. No tracheal deviation present. No thyromegaly present.  Cardiovascular: Normal rate, regular rhythm, normal heart sounds and intact distal pulses.  Exam reveals no gallop.   No murmur heard. Pulmonary/Chest: Effort normal and breath sounds normal. No stridor. No respiratory distress. She has no wheezes. She has no rales.   She exhibits no tenderness.       The area over the left flank was cleaned with betadine and prepped and draped in sterile fashion, local anesthesia was obtained with 2% lido with epi, a 4 mm punch incision was made, the contents of the cyst was black and cheesy, there was a slight foul odor, there was no exudate, a clx was sent, the cavity was cleaned with H202,  it was packed with iodoform and a dressing was applied.  Abdominal: Soft. Bowel sounds are normal. She exhibits no distension and no mass. There is no tenderness. There is no rebound and no guarding.  Musculoskeletal: Normal range of motion. She exhibits no edema and no tenderness.  Lymphadenopathy:    She has no cervical adenopathy.  Neurological: She is oriented to person, place, and time.  Skin: Skin is warm and dry. No rash noted. She is not diaphoretic. No erythema. No pallor.  Psychiatric: She has a normal mood and affect. Her behavior is normal. Judgment and thought content normal.          Assessment & Plan:

## 2012-06-26 NOTE — Patient Instructions (Signed)

## 2012-06-26 NOTE — Assessment & Plan Note (Signed)
This appears to be a necrotic EIC, I will check the clx result but at this point I do not see any evidence of a bacterial infection, I have asked her to keep the area clean and protected and to leave the iodoform in place, I will recheck the area in 2 days and will remove the packing.

## 2012-06-28 ENCOUNTER — Ambulatory Visit (INDEPENDENT_AMBULATORY_CARE_PROVIDER_SITE_OTHER): Payer: 59 | Admitting: Internal Medicine

## 2012-06-28 ENCOUNTER — Encounter: Payer: Self-pay | Admitting: Internal Medicine

## 2012-06-28 VITALS — BP 108/68 | HR 94 | Temp 98.0°F | Resp 16

## 2012-06-28 DIAGNOSIS — L0390 Cellulitis, unspecified: Secondary | ICD-10-CM

## 2012-06-28 DIAGNOSIS — L0291 Cutaneous abscess, unspecified: Secondary | ICD-10-CM

## 2012-06-28 NOTE — Assessment & Plan Note (Signed)
This was an EIC The culture was negative No further treatment needed

## 2012-06-28 NOTE — Patient Instructions (Signed)

## 2012-06-28 NOTE — Progress Notes (Signed)
  Subjective:    Patient ID: [name], female    DOB: 1971/07/06, 41 y.o.   MRN: [id number]  Wound Check She was originally treated 2 to 3 days ago. Previous treatment included I&D of abscess. There has been no drainage from the wound. There is no redness present. There is no swelling present. The pain has no pain. She has no difficulty moving the affected extremity or digit.      Review of Systems  Constitutional: Negative.   HENT: Negative.   Eyes: Negative.   Respiratory: Negative.   Cardiovascular: Negative.   Gastrointestinal: Negative.   Genitourinary: Negative.   Musculoskeletal: Negative.   Skin: Negative.   Neurological: Negative.   Hematological: Negative.   Psychiatric/Behavioral: Negative.        Objective:   Physical Exam  Vitals reviewed. Constitutional: She appears well-developed and well-nourished. No distress.  HENT:  Head: Normocephalic and atraumatic.  Mouth/Throat: No oropharyngeal exudate.  Eyes: Right eye exhibits no discharge. Left eye exhibits no discharge. No scleral icterus.  Neck: Normal range of motion. Neck supple. No JVD present. No tracheal deviation present. No thyromegaly present.  Cardiovascular: Normal rate, regular rhythm, normal heart sounds and intact distal pulses.  Exam reveals no gallop and no friction rub.   No murmur heard. Pulmonary/Chest: Effort normal and breath sounds normal. No stridor. No respiratory distress. She has no wheezes. She has no rales.   She exhibits no tenderness.  Abdominal: Soft. Bowel sounds are normal. She exhibits no distension and no mass. There is no tenderness. There is no rebound and no guarding.  Lymphadenopathy:    She has no cervical adenopathy.  Skin: Skin is warm and dry. No rash noted. She is not diaphoretic. No erythema. No pallor.          Assessment & Plan:

## 2012-06-29 LAB — WOUND CULTURE: Organism ID, Bacteria: NO GROWTH

## 2012-09-16 DIAGNOSIS — O039 Complete or unspecified spontaneous abortion without complication: Secondary | ICD-10-CM | POA: Insufficient documentation

## 2012-11-01 ENCOUNTER — Ambulatory Visit: Payer: 59 | Admitting: Pulmonary Disease

## 2012-12-13 ENCOUNTER — Ambulatory Visit (INDEPENDENT_AMBULATORY_CARE_PROVIDER_SITE_OTHER): Payer: 59 | Admitting: Pulmonary Disease

## 2012-12-13 ENCOUNTER — Encounter: Payer: Self-pay | Admitting: Pulmonary Disease

## 2012-12-13 VITALS — BP 108/70 | HR 87 | Temp 98.7°F | Ht 66.0 in | Wt 195.0 lb

## 2012-12-13 DIAGNOSIS — R0781 Pleurodynia: Secondary | ICD-10-CM

## 2012-12-13 DIAGNOSIS — R079 Chest pain, unspecified: Secondary | ICD-10-CM

## 2012-12-13 DIAGNOSIS — R0600 Dyspnea, unspecified: Secondary | ICD-10-CM

## 2012-12-13 DIAGNOSIS — I2699 Other pulmonary embolism without acute cor pulmonale: Secondary | ICD-10-CM

## 2012-12-13 DIAGNOSIS — R071 Chest pain on breathing: Secondary | ICD-10-CM

## 2012-12-13 DIAGNOSIS — R0989 Other specified symptoms and signs involving the circulatory and respiratory systems: Secondary | ICD-10-CM

## 2012-12-13 DIAGNOSIS — I279 Pulmonary heart disease, unspecified: Secondary | ICD-10-CM

## 2012-12-13 LAB — PULMONARY FUNCTION TEST

## 2012-12-13 NOTE — Patient Instructions (Signed)
Will schedule CT chest and call with results  Follow up in 6 months 

## 2012-12-13 NOTE — Assessment & Plan Note (Signed)
Her PFT shows mild restriction and diffusion defect.  Will assess further with CT chest.

## 2012-12-13 NOTE — Progress Notes (Signed)
PFT done today. 

## 2012-12-13 NOTE — Assessment & Plan Note (Signed)
She has recurrent Lt pleuritic chest pain.  ECG showed expected changes from hx of pulmonary hypertension, but no acute findings.  Will arrange for CT angiogram chest to exclude recurrence of pulmonary embolism.

## 2012-12-13 NOTE — Progress Notes (Signed)
Chief Complaint  Patient presents with  . 6 month follow up    with PFT.  Has increased SOB and feels very tired with any activity x 1 wks.  Also has a sticking, burning pain in left chest under breast.     History of Present Illness: [name] is a 42 y.o. female with dyspnea.  She is here to review her PFT.  This showed mild restriction and diffusion defect.  She has developed left sided chest pain over the past one week.  This has been persistent.  It hurts when she takes a deep breath, and is worse with exertion.    She denies fever, cough, wheeze, sputum, hemoptysis, nausea, or leg swelling.  TESTS: CXR 04/25/12 >> no acute abnormalities PFT 11/14/12 >> FEV1 2.52 (95%), FEV1% 92, TLC 3.75 (70%), DLCO 76%, no BD  [name]  has a past medical history of Pulmonary embolism (2005); Pulmonary hypertension; Primary hypercoagulable state; Hepatitis B infection; IUD; DVT (deep venous thrombosis); and Seasonal allergies.  [name]  has past surgical history that includes Pulmonary artery endarterectomy (2007) and Cardiac catheterization (11/2005).  Prior to Admission medications   Medication Sig Start Date End Date Taking? Authorizing Provider  acetaminophen (TYLENOL) 325 MG tablet Take 650 mg by mouth every 8 (eight) hours as needed. For pain    Historical Provider, MD  albuterol (PROVENTIL HFA;VENTOLIN HFA) 108 (90 BASE) MCG/ACT inhaler Inhale 2 puffs into the lungs every 6 (six) hours as needed. For shortness of breath or wheezing    Historical Provider, MD  cetirizine (ZYRTEC ALLERGY) 10 MG tablet Take 10 mg by mouth every morning. For allergies    Historical Provider, MD  fluticasone (FLONASE) 50 MCG/ACT nasal spray Place 2 sprays into the nose daily. 03/09/12 03/09/13  Genelle Gather, MD  Multiple Vitamin (MULTIVITAMIN WITH MINERALS) TABS Take 1 tablet by mouth daily. Centrum    Historical Provider, MD  Rivaroxaban (XARELTO) 20 MG TABS Take 20 mg by mouth every  morning.    Historical Provider, MD  sodium chloride (OCEAN) 0.65 % SOLN nasal spray Place 1 spray into the nose every 6 (six) hours as needed. For dry nose 09/28/10   Sinda Du  spironolactone (ALDACTONE) 25 MG tablet TAKE 1 TABLET BY MOUTH DAILY. 06/14/12   Burns Spain, MD    Allergies  Allergen Reactions  . Aspirin     Since being in [place], told she has an ulcer     Physical Exam:  General - No distress ENT - No sinus tenderness, no oral exudate, no LAN Cardiac - s1s2 regular, no murmur Chest - No wheeze/rales/dullness Back - No focal tenderness Abd - Soft, non-tender Ext - No edema Neuro - Normal strength Skin - No rashes Psych - normal mood, and behavior  ECG - sinus rhythm rate 85, left atrial enlargment   Assessment/Plan:  Coralyn Helling, MD [facility] Pager:  [phone]

## 2012-12-13 NOTE — Assessment & Plan Note (Signed)
She is to continue xarelto.  She is followed at [facility] for her Pulmonary hypertension.

## 2012-12-14 ENCOUNTER — Ambulatory Visit (INDEPENDENT_AMBULATORY_CARE_PROVIDER_SITE_OTHER)
Admission: RE | Admit: 2012-12-14 | Discharge: 2012-12-14 | Disposition: A | Discharge status: Home / Self Care | Payer: 59 | Source: Ambulatory Visit | Attending: Pulmonary Disease | Admitting: Pulmonary Disease

## 2012-12-14 DIAGNOSIS — R079 Chest pain, unspecified: Secondary | ICD-10-CM

## 2012-12-14 IMAGING — CT CT ANGIO CHEST
2 of 6 series · 19 of 36 positions shown · IV contrast (Omnipaque 300)
Comparison: [DATE]; [DATE]

CLINICAL DATA: Left chest pain.  Lethargy.  History of embolus.

CT ANGIOGRAPHY CHEST
TECHNIQUE: Multidetector CT imaging of the chest using the
standard protocol during bolus administration of intravenous
contrast. Multiplanar reconstructed images including MIPs were
obtained and reviewed to evaluate the vascular anatomy.
Contrast: 80mL OMNIPAQUE IOHEXOL 350 MG/ML SOLN

[Series 5: thins (id) / (id) · axial · 0.64mm/px · z∈[-199,-1]mm · 18 of 220 slices shown]
[im 11/220  lung]
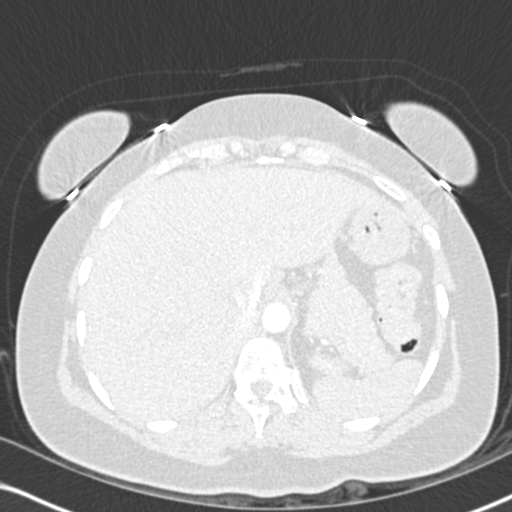
[im 22/220  mediastinal]
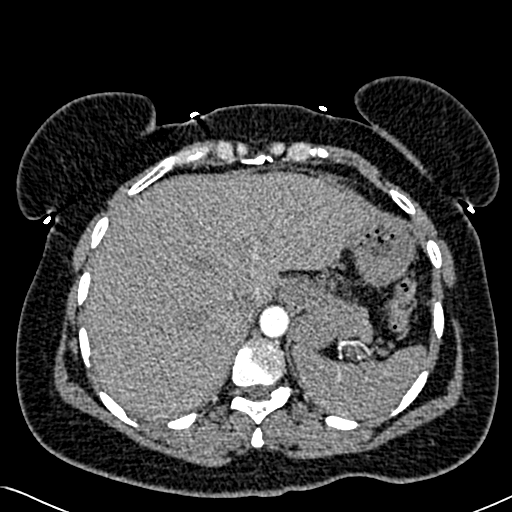
[im 33/220  lung]
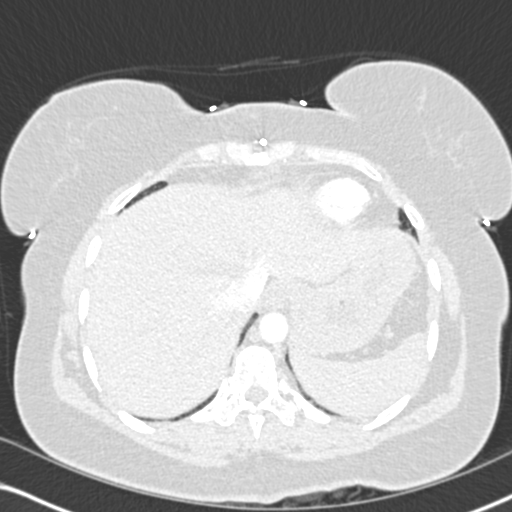
[im 44/220  mediastinal]
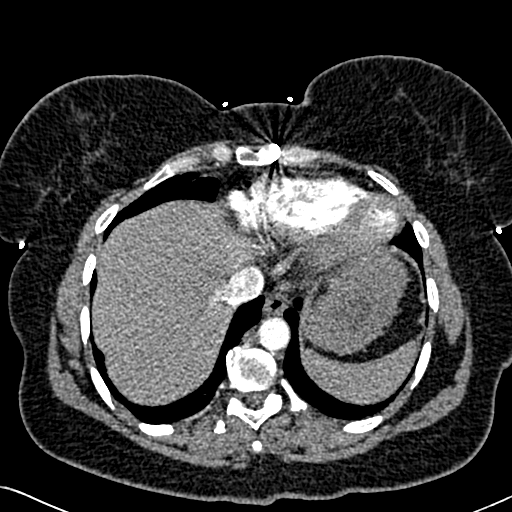
[im 55/220  lung]
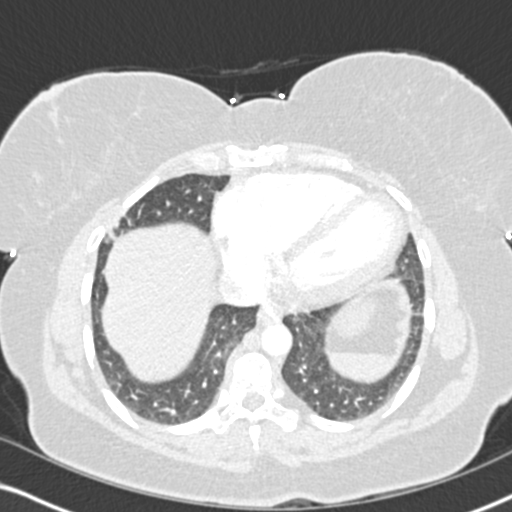
[im 66/220  mediastinal]
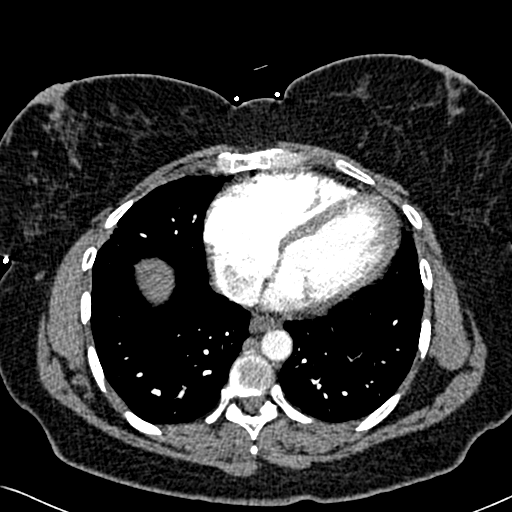
[im 77/220  lung]
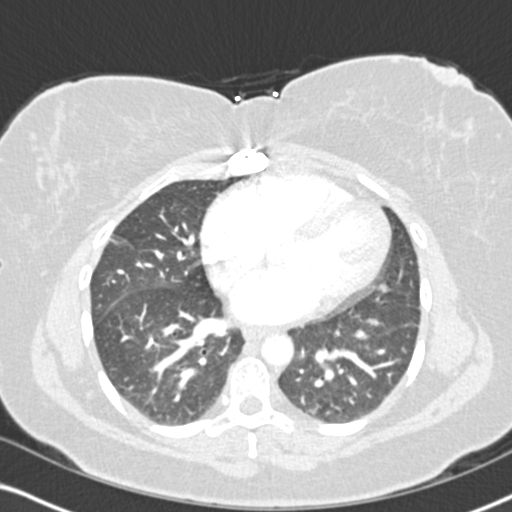
[im 88/220  mediastinal]
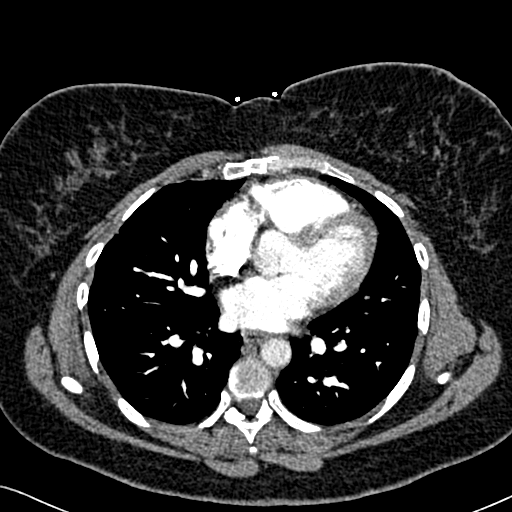
[im 99/220  lung]
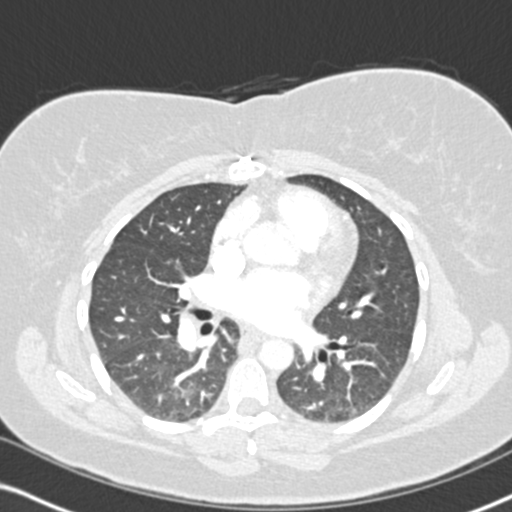
[im 121/220  mediastinal]
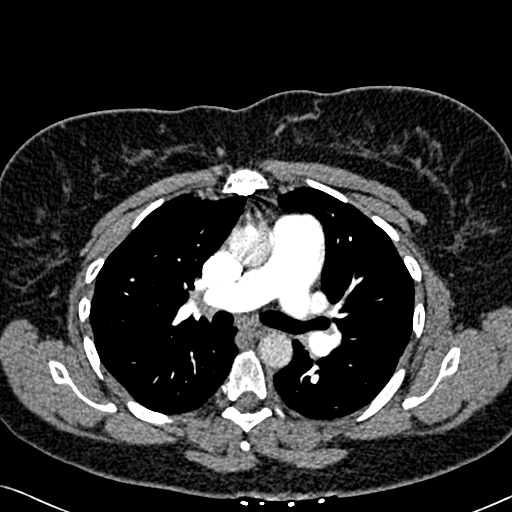
[im 132/220  lung]
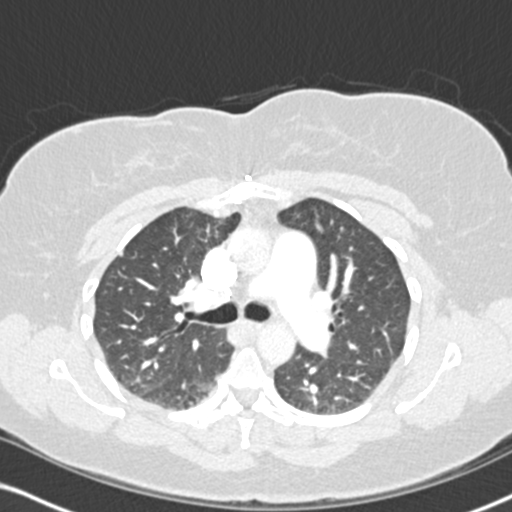
[im 143/220  mediastinal]
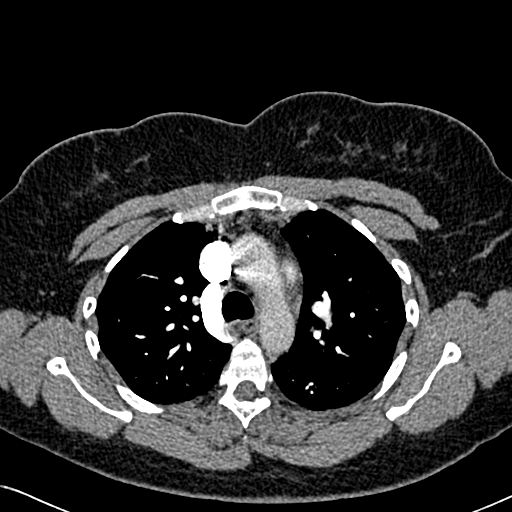
[im 154/220  lung]
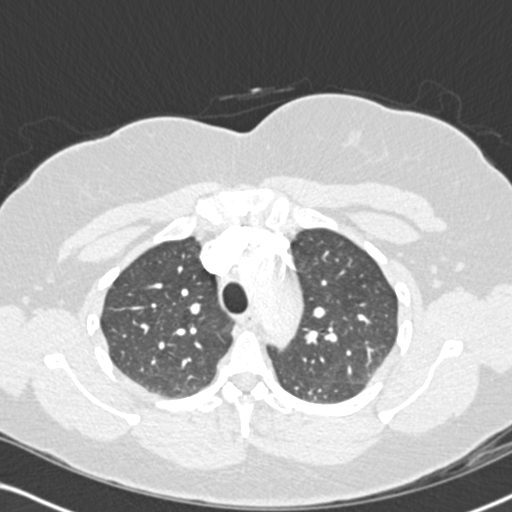
[im 165/220  mediastinal]
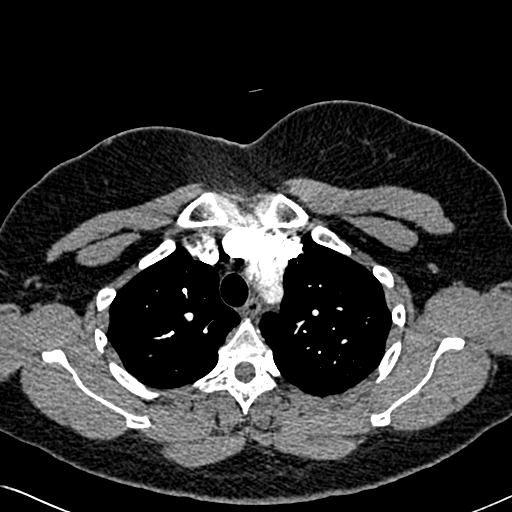
[im 176/220  lung]
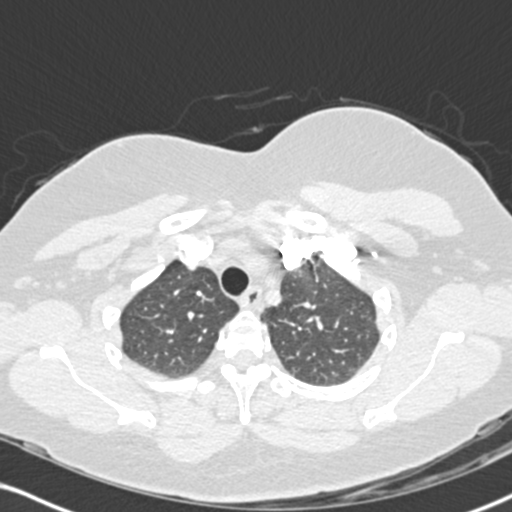
[im 187/220  mediastinal]
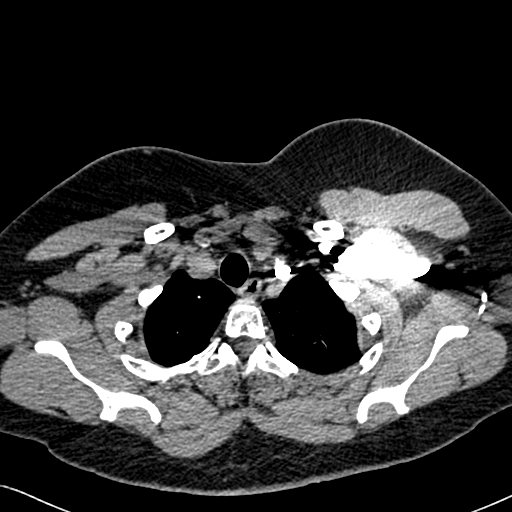
[im 198/220  lung]
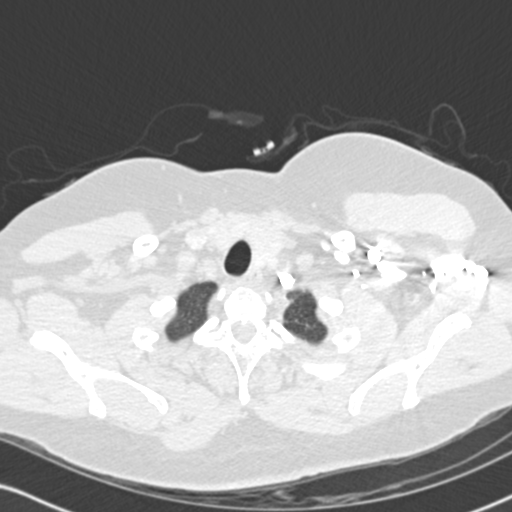
[im 209/220  mediastinal]
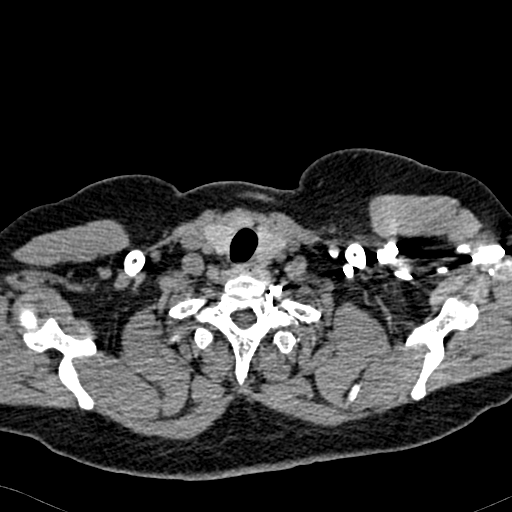

[Series 602: cor mpr · coronal · 0.64mm/px · 1 of 89 slices shown]
[im 45/89  mediastinal]
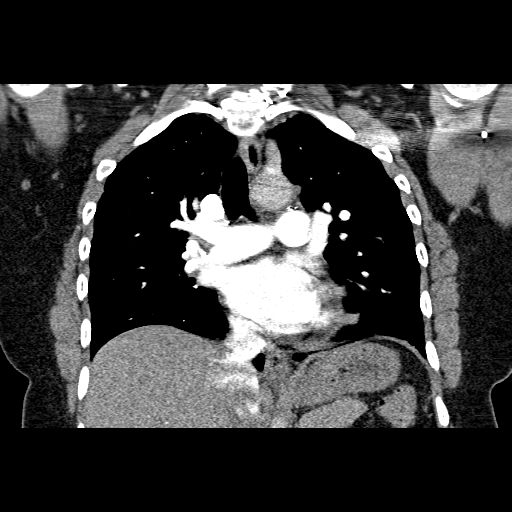

[19 of 36 positions shown; findings below may reference images not displayed]

FINDINGS: No filling defect is identified in the pulmonary arterial
tree to suggest pulmonary embolus.

Aorta is only partially opacified on today's exam.

Stable 3 mm subpleural nodule, right upper lobe, image 29 of series
6, benign.

Stable scarring medially, apical posterior segment left upper lobe.

The lungs appear otherwise clear.
IMPRESSION: 1.  No embolus identified.  No specific cause for left chest pain.

## 2012-12-14 MED ORDER — IOHEXOL 350 MG/ML SOLN
80.0000 mL | Freq: Once | INTRAVENOUS | Status: AC | PRN
  Administered 2012-12-14: 80 mL via INTRAVENOUS

## 2012-12-15 ENCOUNTER — Telehealth: Payer: Self-pay | Admitting: Pulmonary Disease

## 2012-12-15 NOTE — Telephone Encounter (Signed)
Ct Angio Chest W/cm &/or Wo Cm  12/14/2012   *RADIOLOGY REPORT*  Clinical Data: Left chest pain.  Lethargy.  History of embolus.  CT ANGIOGRAPHY CHEST  Technique:  Multidetector CT imaging of the chest using the standard protocol during bolus administration of intravenous contrast. Multiplanar reconstructed images including MIPs were obtained and reviewed to evaluate the vascular anatomy.  Contrast: 80mL OMNIPAQUE IOHEXOL 350 MG/ML SOLN  Comparison: 05/23/2012; 05/24/2011  Findings: No filling defect is identified in the pulmonary arterial tree to suggest pulmonary embolus.  Aorta is only partially opacified on today's exam.  Stable 3 mm subpleural nodule, right upper lobe, image 29 of series 6, benign.  Stable scarring medially, apical posterior segment left upper lobe.  The lungs appear otherwise clear.  IMPRESSION:  1.  No embolus identified.  No specific cause for left chest pain.   Original Report Authenticated By: Gaylyn Rong, M.D.    Left message explaining that CT chest was negative for recurrent PE, and did not show evidence for other pathology that could explain her chest pain.  Advised she could try using OTC protonix to see if this provides symptomatic benefit for possible reflux as cause of her atypical chest pain.  She can also use prn tylenol.  Advised her to call back to office with questions.

## 2013-01-05 ENCOUNTER — Encounter: Payer: Self-pay | Admitting: Pulmonary Disease

## 2013-01-26 ENCOUNTER — Encounter: Payer: Self-pay | Admitting: Advanced Practice Midwife

## 2013-01-26 ENCOUNTER — Ambulatory Visit (INDEPENDENT_AMBULATORY_CARE_PROVIDER_SITE_OTHER): Payer: 59 | Admitting: Advanced Practice Midwife

## 2013-01-26 VITALS — BP 118/78 | HR 88 | Ht 63.0 in | Wt 199.2 lb

## 2013-01-26 DIAGNOSIS — Z01419 Encounter for gynecological examination (general) (routine) without abnormal findings: Secondary | ICD-10-CM

## 2013-01-26 DIAGNOSIS — Z124 Encounter for screening for malignant neoplasm of cervix: Secondary | ICD-10-CM

## 2013-01-26 NOTE — Patient Instructions (Signed)
Place annual gynecologic exam patient instructions here.

## 2013-01-26 NOTE — Progress Notes (Signed)
  Subjective:     [name] is a 42 y.o. female and is here for a well-woman exam and Pap. The patient reports no problems.  She reports regular menses, denies current vaginal bleeding, vaginal itching/burning, urinary symptoms, h/a, dizziness, n/v, or fever/chills. Patient's last menstrual period was 01/07/2013. She has medical hx of PE and cardiac infarction and is on long-term anticoagulants.  She denies any chest pain or dyspnea today.    Patient Active Problem List   Diagnosis Date Noted  . Pleuritic chest pain 12/13/2012  . Dyspnea 12/13/2012  . Abscess 06/26/2012  . Allergic rhinitis 03/11/2012  . Antiphospholipid syndrome 05/24/2011  . Hepatitis B infection   . Pulmonary embolism and infarction 10/05/2010  . Long term current use of anticoagulant 10/05/2010  . Preventative health care 09/28/2010  . HYPERCOAGULABLE STATE, PRIMARY 07/20/2006  . COR PULMONALE 05/10/2006    History   Social History  . Marital Status: Married    Spouse Name: N/A    Number of Children: 3  . Years of Education: N/A   Occupational History  .     Social History Main Topics  . Smoking status: Never Smoker   . Smokeless tobacco: Never Used  . Alcohol Use: No  . Drug Use: No  . Sexually Active: Yes   Other Topics Concern  . Not on file   Social History Narrative   Pt lives in [place], braids hair for a living.    Health Maintenance  Topic Date Due  . Influenza Vaccine  04/02/2013  . Pap Smear  01/12/2015  . Tetanus/tdap  06/26/2022    The following portions of the patient's history were reviewed and updated as appropriate: allergies, current medications, past family history, past medical history, past social history, past surgical history and problem list.  Review of Systems A comprehensive review of systems was negative.   Objective:    BP 118/78  Pulse 88  Ht 5\' 3"  (1.6 m)  Wt 199 lb 3.2 oz (90.357 kg)  BMI 35.3 kg/m2  LMP 01/07/2013 General appearance: alert,  cooperative, appears stated age and no distress Neck: no adenopathy, no carotid bruit, no JVD, supple, symmetrical, trachea midline and thyroid not enlarged, symmetric, no tenderness/mass/nodules Lungs: clear to auscultation bilaterally Breasts: normal appearance, no masses or tenderness Heart: regular rate and rhythm, S1, S2 normal, no murmur, click, rub or gallop Abdomen: soft, non-tender; bowel sounds normal; no masses,  no organomegaly Pelvic: cervix normal in appearance, external genitalia normal, no adnexal masses or tenderness, no cervical motion tenderness, rectovaginal septum normal, uterus normal size, shape, and consistency and vagina normal without discharge Extremities: extremities normal, atraumatic, no cyanosis or edema Skin: Skin color, texture, turgor normal. No rashes or lesions Lymph nodes: Cervical, supraclavicular, and axillary nodes normal. Neurologic: Alert and oriented X 3, normal strength and tone. Normal symmetric reflexes. Normal coordination and gait    Assessment:      1. Screening for malignant neoplasm of the cervix   2. Routine gynecological examination         Plan:    1.  F/U with well-woman exam in 1 year and Pap per guidelines.

## 2013-02-09 ENCOUNTER — Telehealth: Payer: Self-pay | Admitting: Pulmonary Disease

## 2013-02-09 MED ORDER — ALBUTEROL SULFATE HFA 108 (90 BASE) MCG/ACT IN AERS: 2.0000 | INHALATION_SPRAY | Freq: Four times a day (QID) | RESPIRATORY_TRACT | Status: DC | PRN

## 2013-02-09 NOTE — Telephone Encounter (Signed)
No samples available so refill sent. Pt is aware. Carron Curie, CMA

## 2013-02-09 NOTE — Telephone Encounter (Signed)
Pt states that she is having chest wall pain again. VS advised her of her last CXR results and told her take Tylenol for the pain but if the pain comes back to use OTC Prilosec.  Pt just wanted to make sure that this was correct. Nothing further was needed at this time.

## 2013-02-25 ENCOUNTER — Encounter (HOSPITAL_COMMUNITY): Payer: Self-pay | Admitting: Emergency Medicine

## 2013-02-25 ENCOUNTER — Emergency Department (HOSPITAL_COMMUNITY)
Admission: EM | Admit: 2013-02-25 | Discharge: 2013-02-25 | Disposition: A | Discharge status: Home / Self Care | Payer: 59 | Attending: Emergency Medicine | Admitting: Emergency Medicine

## 2013-02-25 ENCOUNTER — Emergency Department (HOSPITAL_COMMUNITY): Payer: 59

## 2013-02-25 DIAGNOSIS — F438 Other reactions to severe stress: Secondary | ICD-10-CM | POA: Insufficient documentation

## 2013-02-25 DIAGNOSIS — F4389 Other reactions to severe stress: Secondary | ICD-10-CM | POA: Insufficient documentation

## 2013-02-25 DIAGNOSIS — Z86718 Personal history of other venous thrombosis and embolism: Secondary | ICD-10-CM | POA: Insufficient documentation

## 2013-02-25 DIAGNOSIS — Z86711 Personal history of pulmonary embolism: Secondary | ICD-10-CM | POA: Insufficient documentation

## 2013-02-25 DIAGNOSIS — Z79899 Other long term (current) drug therapy: Secondary | ICD-10-CM | POA: Insufficient documentation

## 2013-02-25 DIAGNOSIS — F4321 Adjustment disorder with depressed mood: Secondary | ICD-10-CM

## 2013-02-25 DIAGNOSIS — I270 Primary pulmonary hypertension: Secondary | ICD-10-CM | POA: Insufficient documentation

## 2013-02-25 DIAGNOSIS — R079 Chest pain, unspecified: Secondary | ICD-10-CM | POA: Insufficient documentation

## 2013-02-25 LAB — BASIC METABOLIC PANEL
BUN: 9 mg/dL (ref 6–23)
CO2: 25 mEq/L (ref 19–32)
Calcium: 9.7 mg/dL (ref 8.4–10.5)
Chloride: 102 mEq/L (ref 96–112)
Creatinine, Ser: 0.98 mg/dL (ref 0.50–1.10)
GFR calc Af Amer: 80 mL/min — ABNORMAL LOW (ref >90)
GFR calc non Af Amer: 69 mL/min — ABNORMAL LOW (ref >90)
Glucose, Bld: 110 mg/dL — ABNORMAL HIGH (ref 70–99)
Potassium: 3.6 mEq/L (ref 3.5–5.1)
Sodium: 136 mEq/L (ref 135–145)

## 2013-02-25 LAB — CBC
HCT: 40.9 % (ref 36.0–46.0)
Hemoglobin: 14 g/dL (ref 12.0–15.0)
MCH: 29.4 pg (ref 26.0–34.0)
MCHC: 34.2 g/dL (ref 30.0–36.0)
MCV: 85.9 fL (ref 78.0–100.0)
Platelets: 257 10*3/uL (ref 150–400)
RBC: 4.76 MIL/uL (ref 3.87–5.11)
RDW: 13.1 % (ref 11.5–15.5)
WBC: 5.9 10*3/uL (ref 4.0–10.5)

## 2013-02-25 LAB — TROPONIN I: Troponin I: <0.3 ng/mL (ref <0.30)

## 2013-02-25 IMAGING — CR DG CHEST 1V PORT
1 series · 1 of 1 positions shown · non-contrast
Comparison: Chest x-ray [DATE].

CLINICAL DATA: Shortness of breath.

PORTABLE CHEST - 1 VIEW

[AP]
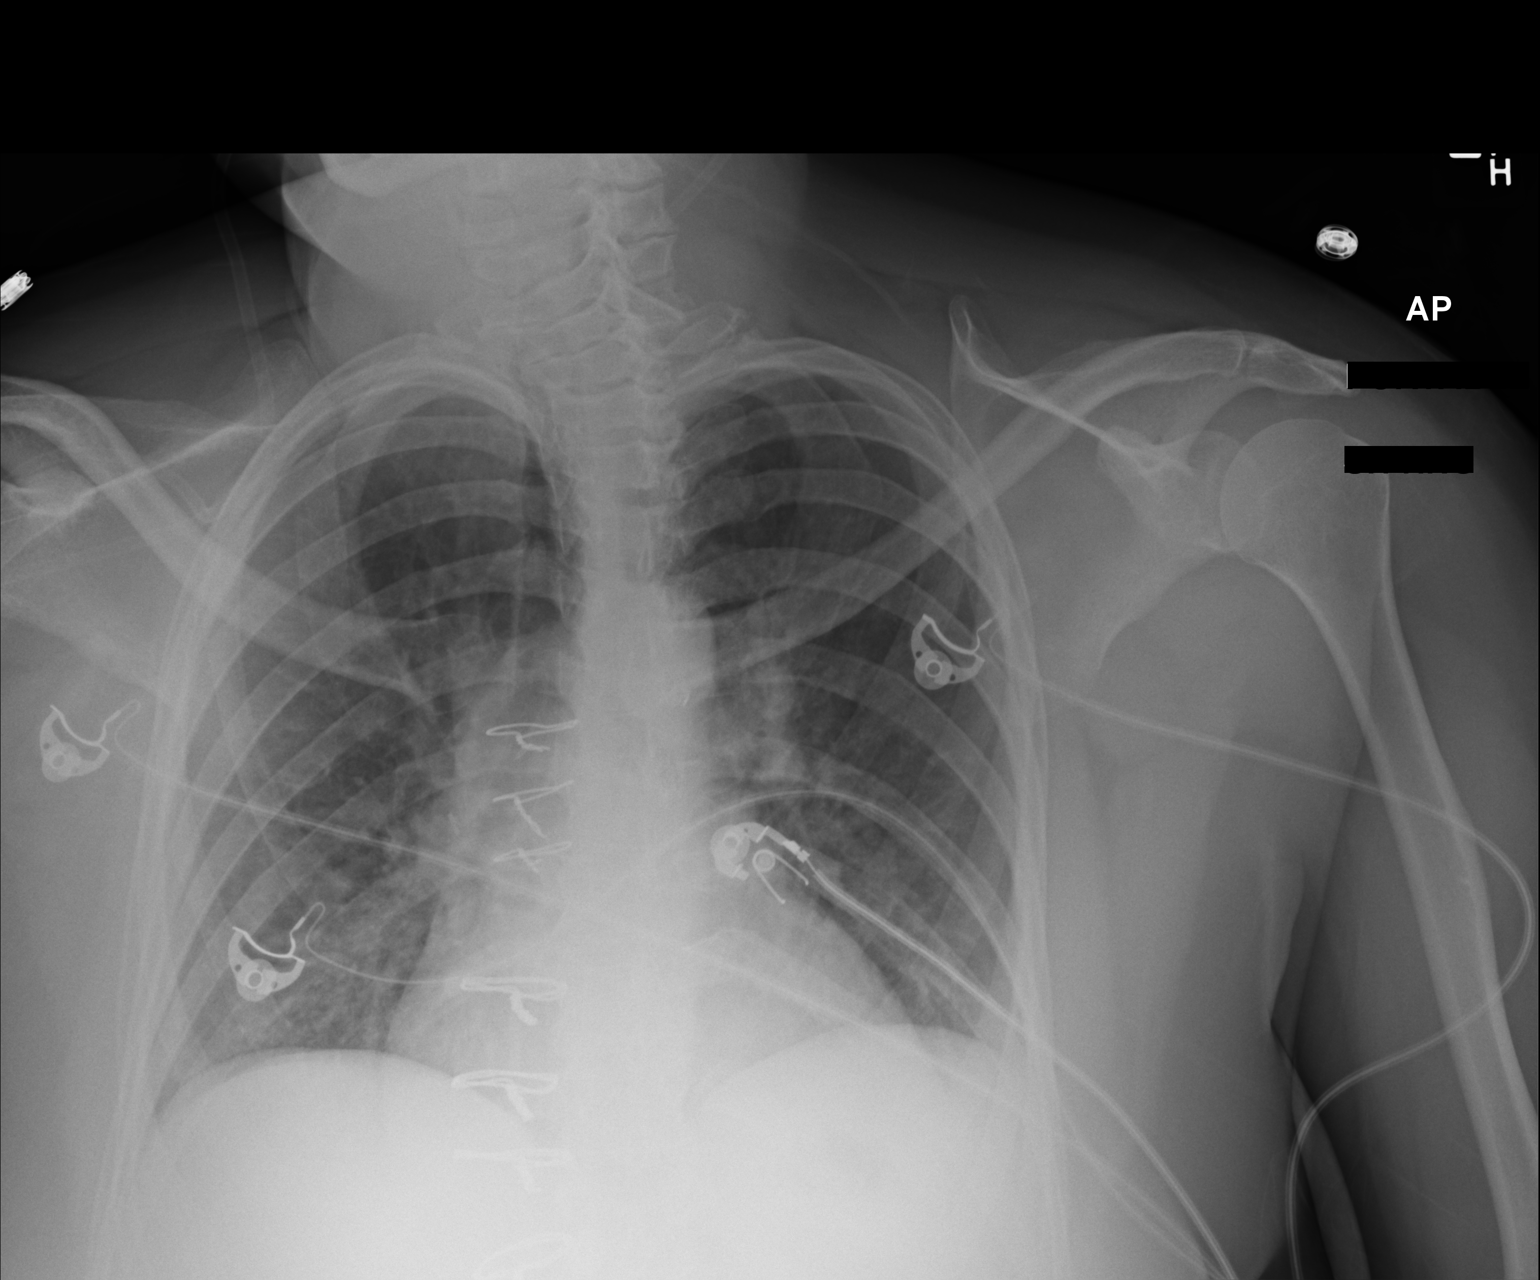

[1 of 1 positions shown; findings below may reference images not displayed]

FINDINGS: Lung volumes are very low.  There are some linear
bibasilar opacities compatible with mild subsegmental atelectasis.
No acute consolidative airspace disease.  No pleural effusions.  No
pneumothorax.  No definite suspicious appearing pulmonary nodules
or masses are identified.  Pulmonary vasculature is within normal
limits for the low lung volumes.  Heart size is upper limits of
normal. The patient is rotated to the right on today's exam,
resulting in distortion of the mediastinal contours and reduced
diagnostic sensitivity and specificity for mediastinal pathology.
Sternotomy wires.
IMPRESSION: 1.  Low lung volumes with probable bibasilar subsegmental
atelectasis.  No radiographic evidence of acute cardiopulmonary
disease.

## 2013-02-25 MED ORDER — ACETAMINOPHEN 500 MG PO TABS
1000.0000 mg | ORAL_TABLET | Freq: Once | ORAL | Status: AC
  Administered 2013-02-25: 1000 mg via ORAL
  Filled 2013-02-25: qty 2

## 2013-02-25 NOTE — Discharge Instructions (Signed)
Chest Pain (Nonspecific) °Chest pain has many causes. Your pain could be caused by something serious, such as a heart attack or a blood clot in the lungs. It could also be caused by something less serious, such as a chest bruise or a virus. Follow up with your doctor. More lab tests or other studies may be needed to find the cause of your pain. Most of the time, nonspecific chest pain will improve within 2 to 3 days of rest and mild pain medicine. °HOME CARE °· For chest bruises, you may put ice on the sore area for 15-20 minutes, 3-4 times a day. Do this only if it makes you or your child feel better. °· Put ice in a plastic bag. °· Place a towel between the skin and the bag. °· Rest for the next 2 to 3 days. °· Go back to work if the pain improves. °· See your doctor if the pain lasts longer than 1 to 2 weeks. °· Only take medicine as told by your doctor. °· Quit smoking if you smoke. °GET HELP RIGHT AWAY IF:  °· There is more pain or pain that spreads to the arm, neck, jaw, back, or belly (abdomen). °· You or your child has shortness of breath. °· You or your child coughs more than usual or coughs up blood. °· You or your child has very bad back or belly pain, feels sick to his or her stomach (nauseous), or throws up (vomits). °· You or your child has very bad weakness. °· You or your child passes out (faints). °· You or your child has a temperature by mouth above 102° F (38.9° C), not controlled by medicine. °Any of these problems may be serious and may be an emergency. Do not wait to see if the problems will go away. Get medical help right away. Call your local emergency services 911 in [place] Do not drive yourself to the hospital. °MAKE SURE YOU:  °· Understand these instructions. °· Will watch this condition. °· Will get help right away if you or your child is not doing well or gets worse. °Document Released: 01/05/2008 Document Revised: 10/11/2011 Document Reviewed: 01/05/2008 °Exit[employer]® Patient Information  ©2014 [employer]. ° °

## 2013-02-25 NOTE — ED Notes (Signed)
Patient brought in via [facility] with complaints of CP. Onset around 08:00am after recieveing a telephone call that her step father had passed away this morning. CP is in the left chest area and non radiating. Denies nausea, vomiting, Skin is warm and dry. NSR at this time.

## 2013-02-25 NOTE — ED Provider Notes (Signed)
CSN: [id number]     Arrival date & time 02/25/13  7829 History     First MD Initiated Contact with Patient 02/25/13 [phone]     Chief Complaint  Patient presents with  . Chest Pain   (Consider location/radiation/quality/duration/timing/severity/associated sxs/prior Treatment) HPI Pt is a 42yo female BIB [facility] with c/o non-radiating CP, onset around 08:00 this morning after [place] her stepfather died this morning.  CP is in left side, sharp and aching, 9/10.  Was given nitro PTA by EMS, has not helped with pain.  Pt c/o headache.  Denies n/v, or diaphoresis. Skin is warm and dry.  Pt does have hx of CP in past, PE, and pulmonary HTN.     Past Medical History  Diagnosis Date  . Pulmonary embolism 2005       . Pulmonary hypertension     2/2 chronic PE. Sees Dr. Carolee Rota, pulmonologist at [facility]  . Primary hypercoagulable state   . Hepatitis B infection     Hep B core Ab 04/2005  . IUD     Mirena  . DVT (deep venous thrombosis)   . Seasonal allergies    Past Surgical History  Procedure Laterality Date  . Pulmonary artery endarterectomy  2007  . Cardiac catheterization  11/2005    R heart cath : Severe pulmonary arterial HTN with evidence of cor pulmonale. Vasodilatory challenge not performed.   Family History  Problem Relation Age of Onset  . Hypertension Mother   . Hypertension Father     deceased  . Alcohol abuse Neg Hx   . Cancer Neg Hx   . Diabetes Neg Hx   . Early death Neg Hx   . Heart disease Neg Hx   . Hyperlipidemia Neg Hx   . Kidney disease Neg Hx   . Stroke Neg Hx    History  Substance Use Topics  . Smoking status: Never Smoker   . Smokeless tobacco: Never Used  . Alcohol Use: No   OB History   Grav Para Term Preterm Abortions TAB SAB Ect Mult Living   4 3 3  1  1   3      Review of Systems  Constitutional: Negative for fever, chills and diaphoresis.  Respiratory: Positive for shortness of breath. Negative for  cough.   Cardiovascular: Positive for chest pain.  Gastrointestinal: Negative for nausea, vomiting and diarrhea.  Neurological: Positive for weakness ( generalized) and headaches ( mild).  All other systems reviewed and are negative.    Allergies  Aspirin  Home Medications   Current Outpatient Rx  Name  Route  Sig  Dispense  Refill  . acetaminophen (TYLENOL) 325 MG tablet   Oral   Take 650 mg by mouth every 8 (eight) hours as needed. For pain         . albuterol (PROVENTIL HFA;VENTOLIN HFA) 108 (90 BASE) MCG/ACT inhaler   Inhalation   Inhale 2 puffs into the lungs every 6 (six) hours as needed. For shortness of breath or wheezing   1 Inhaler   6   . cetirizine (ZYRTEC ALLERGY) 10 MG tablet   Oral   Take 10 mg by mouth every morning. For allergies         . fluticasone (FLONASE) 50 MCG/ACT nasal spray   Nasal   Place 2 sprays into the nose daily.   16 g   3   . Multiple Vitamin (MULTIVITAMIN WITH MINERALS) TABS  Oral   Take 1 tablet by mouth daily. Centrum         . Rivaroxaban (XARELTO) 20 MG TABS   Oral   Take 20 mg by mouth every morning.         . sodium chloride (OCEAN) 0.65 % SOLN nasal spray   Nasal   Place 1 spray into the nose every 6 (six) hours as needed. For dry nose         . spironolactone (ALDACTONE) 25 MG tablet   Oral   Take 25 mg by mouth daily.          BP 123/82  Pulse 73  Temp(Src) 98.2 F (36.8 C) (Oral)  Resp 18  SpO2 100%  LMP 01/30/2013 Physical Exam  Nursing note and vitals reviewed. Constitutional: She appears well-developed and well-nourished.  Pt appears fatigued, lying in darkened exam room.   HENT:  Head: Normocephalic and atraumatic.  Eyes: Conjunctivae are normal. No scleral icterus.  Neck: Normal range of motion. Neck supple.  Cardiovascular: Normal rate, regular rhythm and normal heart sounds.   Pulmonary/Chest: Effort normal and breath sounds normal. No respiratory distress. She has no wheezes. She has  no rales. She exhibits no tenderness.  No respiratory distress.   Abdominal: Soft. Bowel sounds are normal. She exhibits no distension and no mass. There is no tenderness. There is no rebound and no guarding.  Musculoskeletal: Normal range of motion.  Neurological: She is alert.  Skin: Skin is warm and dry.    ED Course   Procedures (including critical care time)  Labs Reviewed  BASIC METABOLIC PANEL - Abnormal; Notable for the following:    Glucose, Bld 110 (*)    GFR calc non Af Amer 69 (*)    GFR calc Af Amer 80 (*)    All other components within normal limits  TROPONIN I  CBC   Dg Chest Port 1 View  02/25/2013   *RADIOLOGY REPORT*  Clinical Data: Shortness of breath.  PORTABLE CHEST - 1 VIEW  Comparison: Chest x-ray 05/23/2012.  Findings: Lung volumes are very low.  There are some linear bibasilar opacities compatible with mild subsegmental atelectasis. No acute consolidative airspace disease.  No pleural effusions.  No pneumothorax.  No definite suspicious appearing pulmonary nodules or masses are identified.  Pulmonary vasculature is within normal limits for the low lung volumes.  Heart size is upper limits of normal. The patient is rotated to the right on today's exam, resulting in distortion of the mediastinal contours and reduced diagnostic sensitivity and specificity for mediastinal pathology. Sternotomy wires.  IMPRESSION: 1.  Low lung volumes with probable bibasilar subsegmental atelectasis.  No radiographic evidence of acute cardiopulmonary disease.   Original Report Authenticated By: Trudie Reed, M.D.   1. Chest pain   2. Grief reaction     MDM  Chest pain atypical for ACS, however due to pt's PMH will get labs and CXR.  Pt is low risk for PE based on Well's Criteria.Tx: acetaminophen for headache.   Labs: unremarkable, neg troponin.   CXR: low lung volumes, pulmonary vasculature is WNL, nl heart size  Believe chest pain is due to grief reaction from loss of  stepfather this morning.  Will have pt f/u with Dr. Yetta Barre, PCP, and Dr. Soon, pulmonology, as needed for ongoing chest pain.  Return precautions given.  Pt verbalized understanding and agreement with tx plan. Vitals: unremarkable. Discharged in stable condition.    Discussed pt with attending during ED encounter.  Junius Finner, PA-C 02/25/13 1329

## 2013-02-25 NOTE — ED Notes (Signed)
Xray in for portable chest

## 2013-02-25 NOTE — ED Notes (Signed)
Patient discharged with husband using the teach back method she verbalizes an understanding. NAD note at the time of discharge.

## 2013-02-25 NOTE — ED Provider Notes (Signed)
Date: 02/25/2013  Rate:80  Rhythm: normal sinus rhythm  QRS Axis: normal  Intervals: normal PQRS:  Biatrial enlargement.  ST/T Wave abnormalities: nonspecific T wave changes and ST elevations inferiorly, which were present on her prior EKG done on 02/28/2012  Conduction Disutrbances:none  Narrative Interpretation: Abnormal EKG  Old EKG Reviewed: unchanged    Carleene Cooper III, MD 02/25/13 [phone]

## 2013-02-27 NOTE — ED Provider Notes (Signed)
Medical screening examination/treatment/procedure(s) were performed by non-physician practitioner and as supervising physician I was immediately available for consultation/collaboration.   Arshi Duarte III, MD 02/27/13 1725 

## 2013-03-14 ENCOUNTER — Other Ambulatory Visit: Payer: Self-pay | Admitting: Pulmonary Disease

## 2013-03-14 DIAGNOSIS — Z1231 Encounter for screening mammogram for malignant neoplasm of breast: Secondary | ICD-10-CM

## 2013-04-03 ENCOUNTER — Telehealth: Payer: Self-pay | Admitting: Pulmonary Disease

## 2013-04-03 NOTE — Telephone Encounter (Signed)
I spoke with pt. She stated she needs vaccine for yellow fever. i advised her she will need to contact the health dept for this. She voiced her understanding and needed nothing further

## 2013-04-23 ENCOUNTER — Ambulatory Visit (HOSPITAL_COMMUNITY)
Admission: RE | Admit: 2013-04-23 | Discharge: 2013-04-23 | Disposition: A | Discharge status: Home / Self Care | Payer: 59 | Source: Ambulatory Visit | Attending: Pulmonary Disease | Admitting: Pulmonary Disease

## 2013-04-23 DIAGNOSIS — Z1231 Encounter for screening mammogram for malignant neoplasm of breast: Secondary | ICD-10-CM | POA: Insufficient documentation

## 2013-04-23 IMAGING — MG MM DIGITAL SCREENING BILAT
4 series · 4 of 4 positions shown · non-contrast
Comparison: Previous exam(s).

CLINICAL DATA: Screening.

DIGITAL SCREENING BILATERAL MAMMOGRAM WITH CAD

[R CC]
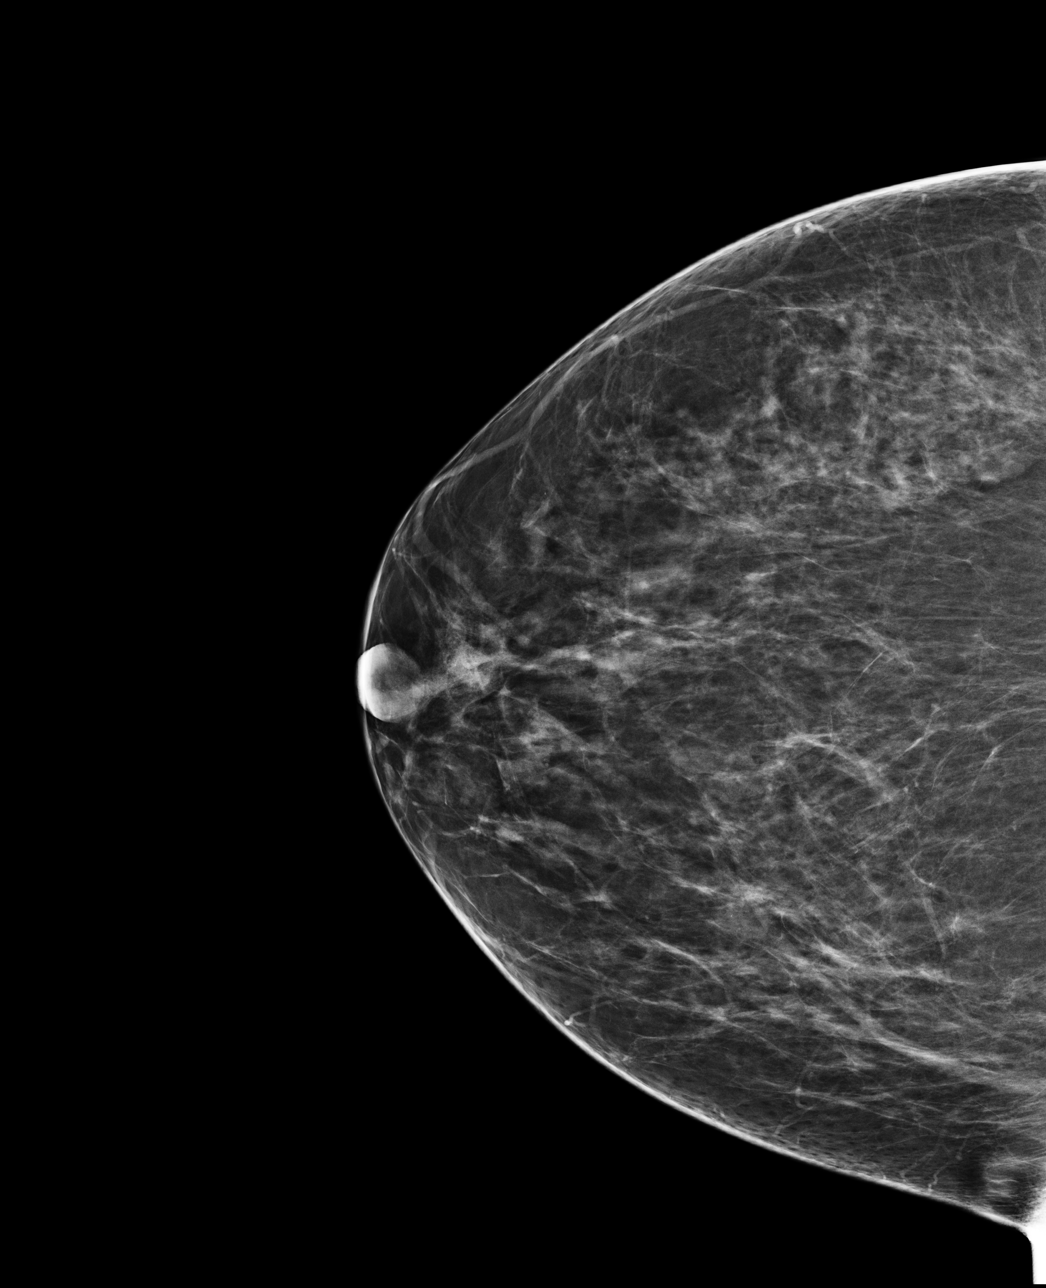

[L CC]
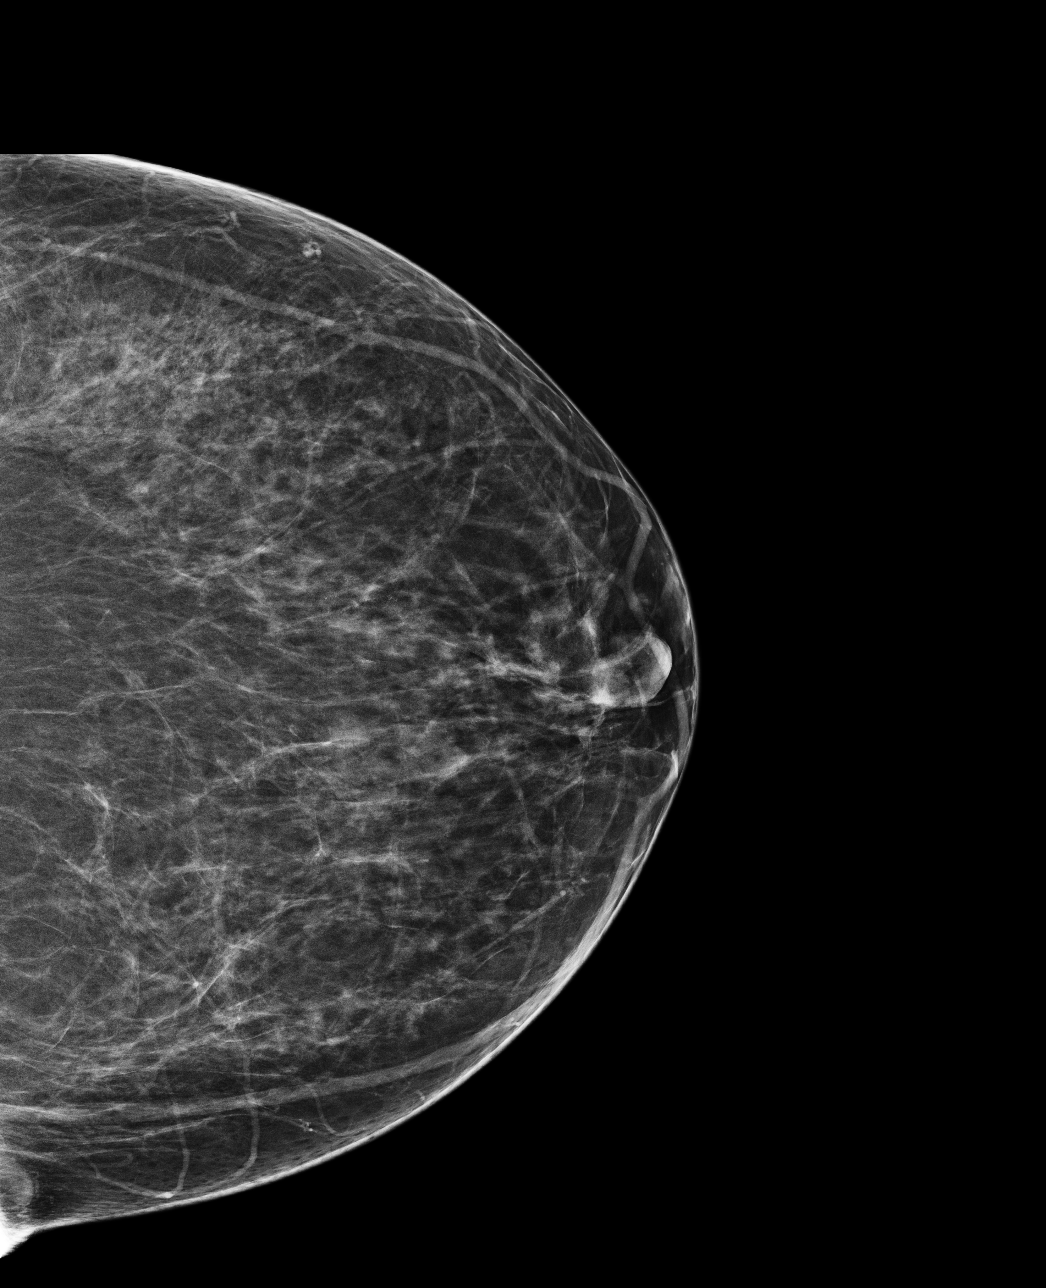

[R MLO]
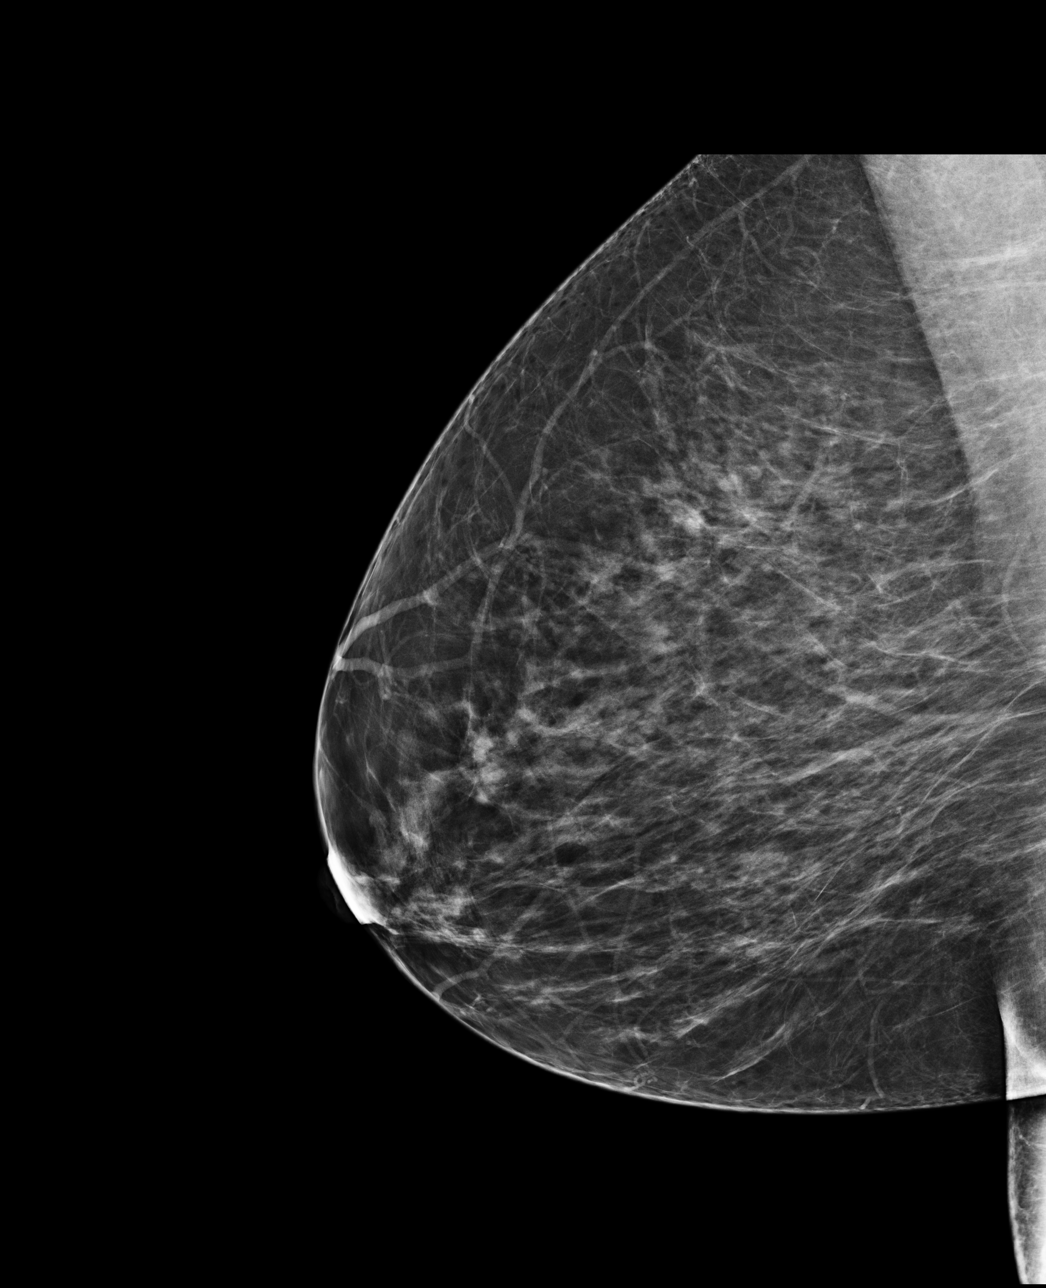

[L MLO]
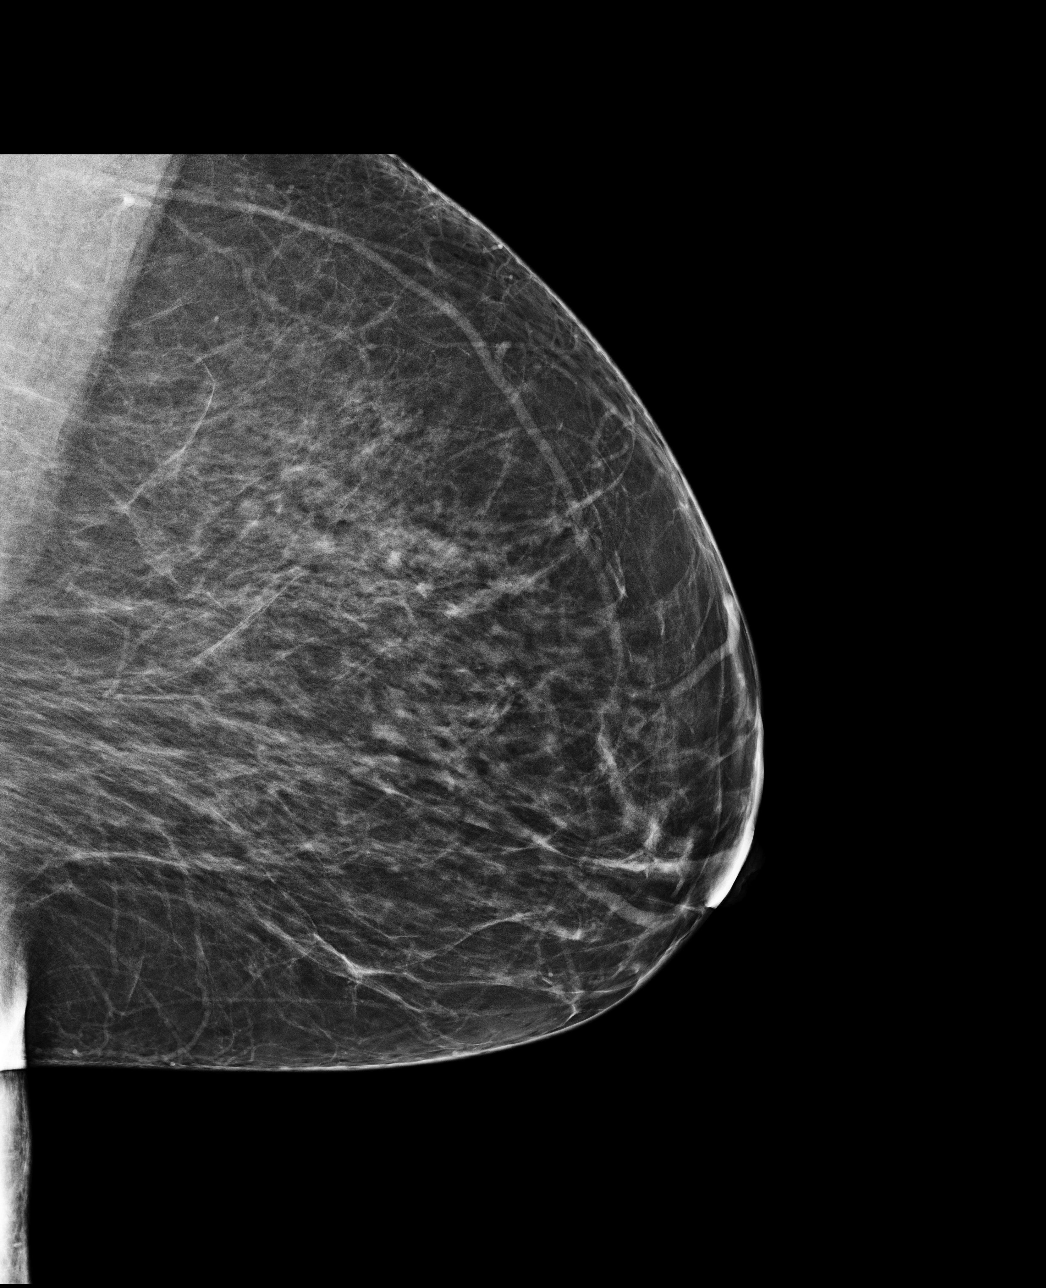

[4 of 4 positions shown; findings below may reference images not displayed]

FINDINGS: ACR Breast Density Category b:  There are scattered areas of
fibroglandular density.

There are no findings suspicious for malignancy.

Images were processed with CAD.
IMPRESSION: No mammographic evidence of malignancy.

A result letter of this screening mammogram will be mailed directly
to the patient.

RECOMMENDATION:
Screening mammogram in one year. (Code:[C3])

BI-RADS CATEGORY 1:  Negative.

## 2013-05-16 ENCOUNTER — Ambulatory Visit (INDEPENDENT_AMBULATORY_CARE_PROVIDER_SITE_OTHER): Payer: 59 | Admitting: Pulmonary Disease

## 2013-05-16 ENCOUNTER — Encounter: Payer: Self-pay | Admitting: Pulmonary Disease

## 2013-05-16 VITALS — BP 110/70 | HR 87 | Ht 64.0 in | Wt 198.0 lb

## 2013-05-16 DIAGNOSIS — I2699 Other pulmonary embolism without acute cor pulmonale: Secondary | ICD-10-CM

## 2013-05-16 DIAGNOSIS — R079 Chest pain, unspecified: Secondary | ICD-10-CM

## 2013-05-16 DIAGNOSIS — R51 Headache: Secondary | ICD-10-CM

## 2013-05-16 MED ORDER — SPIRONOLACTONE 25 MG PO TABS: 25.0000 mg | ORAL_TABLET | Freq: Every day | ORAL | Status: DC

## 2013-05-16 MED ORDER — FLUTICASONE PROPIONATE 50 MCG/ACT NA SUSP: 2.0000 | Freq: Every day | NASAL | Status: DC

## 2013-05-16 MED ORDER — RIVAROXABAN 20 MG PO TABS: 20.0000 mg | ORAL_TABLET | Freq: Every morning | ORAL | Status: DC

## 2013-05-16 MED ORDER — SALINE SPRAY 0.65 % NA SOLN: 1.0000 | Freq: Four times a day (QID) | NASAL | Status: DC | PRN

## 2013-05-16 NOTE — Assessment & Plan Note (Signed)
Seems to be musculoskeletal, and may be related to her bra.  Advised her to try using a sports bra for few weeks.  She can continue prn tylenol.

## 2013-05-16 NOTE — Progress Notes (Signed)
Chief Complaint  Patient presents with  . Follow-up    Breathing is improved. Reports L sided chest pain. Denies chest tightness, SOB, coughing or wheezing.    History of Present Illness: Angeli Demilio is a 42 y.o. female with dyspnea.  Her breathing has been doing okay.  She only notices trouble with her breathing now when she gets anxious form hearing bad news.  She still gets pain under her left breast.  This seems to be more superficial.  She is going to [place] next month, and will be there for 4 months.  She needs medicines to last her through 4 months.  TESTS: CXR 04/25/12 >> no acute abnormalities PFT 11/14/12 >> FEV1 2.52 (95%), FEV1% 92, TLC 3.75 (70%), DLCO 76%, no BD CT chest 12/14/12 >> 3 mm RUL nodule, scar LUL posterior segment  Correna Ramsaran  has a past medical history of Pulmonary embolism (2005); Pulmonary hypertension; Primary hypercoagulable state; Hepatitis B infection; IUD; DVT (deep venous thrombosis); and Seasonal allergies.  Brookelin Peitz  has past surgical history that includes Pulmonary artery endarterectomy (2007) and Cardiac catheterization (11/2005).  Prior to Admission medications   Medication Sig Start Date End Date Taking? Authorizing Provider  acetaminophen (TYLENOL) 325 MG tablet Take 650 mg by mouth every 8 (eight) hours as needed. For pain    Historical Provider, MD  albuterol (PROVENTIL HFA;VENTOLIN HFA) 108 (90 BASE) MCG/ACT inhaler Inhale 2 puffs into the lungs every 6 (six) hours as needed. For shortness of breath or wheezing    Historical Provider, MD  cetirizine (ZYRTEC ALLERGY) 10 MG tablet Take 10 mg by mouth every morning. For allergies    Historical Provider, MD  fluticasone (FLONASE) 50 MCG/ACT nasal spray Place 2 sprays into the nose daily. 03/09/12 03/09/13  Genelle Gather, MD  Multiple Vitamin (MULTIVITAMIN WITH MINERALS) TABS Take 1 tablet by mouth daily. Centrum    Historical Provider, MD  Rivaroxaban (XARELTO) 20 MG TABS Take 20 mg  by mouth every morning.    Historical Provider, MD  sodium chloride (OCEAN) 0.65 % SOLN nasal spray Place 1 spray into the nose every 6 (six) hours as needed. For dry nose 09/28/10   Sinda Du  spironolactone (ALDACTONE) 25 MG tablet TAKE 1 TABLET BY MOUTH DAILY. 06/14/12   Burns Spain, MD    Allergies  Allergen Reactions  . Aspirin Other (See Comments)    Since being in [place], told she has an ulcer     Physical Exam:  General - No distress ENT - No sinus tenderness, no oral exudate, no LAN Cardiac - s1s2 regular, no murmur Chest - No wheeze/rales/dullness Back - No focal tenderness Abd - Soft, non-tender Ext - No edema Neuro - Normal strength Skin - No rashes Psych - normal mood, and behavior   Assessment/Plan:  Coralyn Helling, MD [facility] Pager:  [phone]

## 2013-05-16 NOTE — Assessment & Plan Note (Signed)
She is on life long anti-coagulation.  She is followed at [facility] annually for pulmonary hypertension.

## 2013-05-16 NOTE — Patient Instructions (Signed)
Follow up in 1 year.

## 2013-10-01 ENCOUNTER — Other Ambulatory Visit: Payer: Self-pay | Admitting: Pulmonary Disease

## 2014-01-14 ENCOUNTER — Other Ambulatory Visit: Payer: Self-pay | Admitting: Pulmonary Disease

## 2014-02-18 ENCOUNTER — Other Ambulatory Visit: Payer: Self-pay | Admitting: Pulmonary Disease

## 2014-02-19 ENCOUNTER — Telehealth: Payer: Self-pay | Admitting: Pulmonary Disease

## 2014-02-19 NOTE — Telephone Encounter (Signed)
Pt last had xarelto refilled 10/01/13 #120 x 0 refills QD Last OV 05/16/13 told to f/u in 1 year. Please advise Dr. Halford Chessman thanks

## 2014-02-20 MED ORDER — XARELTO 20 MG PO TABS: ORAL_TABLET | ORAL | Status: DC

## 2014-02-20 NOTE — Telephone Encounter (Signed)
RX has been called/ nothing further needed

## 2014-02-20 NOTE — Telephone Encounter (Signed)
Okay to send refill for xarelto.  Please send 120 pills with 2 refills.

## 2014-03-07 ENCOUNTER — Encounter (HOSPITAL_COMMUNITY): Payer: Self-pay

## 2014-03-07 ENCOUNTER — Inpatient Hospital Stay (HOSPITAL_COMMUNITY)
Admission: AD | Admit: 2014-03-07 | Discharge: 2014-03-07 | Disposition: A | Discharge status: Home / Self Care | Payer: 59 | Source: Ambulatory Visit | Attending: Obstetrics & Gynecology | Admitting: Obstetrics & Gynecology

## 2014-03-07 DIAGNOSIS — Z86718 Personal history of other venous thrombosis and embolism: Secondary | ICD-10-CM | POA: Insufficient documentation

## 2014-03-07 DIAGNOSIS — Z86711 Personal history of pulmonary embolism: Secondary | ICD-10-CM | POA: Insufficient documentation

## 2014-03-07 DIAGNOSIS — R1031 Right lower quadrant pain: Secondary | ICD-10-CM | POA: Insufficient documentation

## 2014-03-07 DIAGNOSIS — Z8249 Family history of ischemic heart disease and other diseases of the circulatory system: Secondary | ICD-10-CM | POA: Insufficient documentation

## 2014-03-07 LAB — URINALYSIS, ROUTINE W REFLEX MICROSCOPIC
Bilirubin Urine: NEGATIVE
Glucose, UA: NEGATIVE mg/dL
Ketones, ur: NEGATIVE mg/dL
LEUKOCYTES UA: NEGATIVE
Nitrite: NEGATIVE
PROTEIN: NEGATIVE mg/dL
Specific Gravity, Urine: 1.01 (ref 1.005–1.030)
UROBILINOGEN UA: 0.2 mg/dL (ref 0.0–1.0)
pH: 7 (ref 5.0–8.0)

## 2014-03-07 LAB — COMPREHENSIVE METABOLIC PANEL
ALBUMIN: 3.4 g/dL — AB (ref 3.5–5.2)
ALK PHOS: 77 U/L (ref 39–117)
ALT: 14 U/L (ref 0–35)
ANION GAP: 9 (ref 5–15)
AST: 22 U/L (ref 0–37)
BUN: 15 mg/dL (ref 6–23)
CALCIUM: 9.3 mg/dL (ref 8.4–10.5)
CO2: 29 mEq/L (ref 19–32)
Chloride: 100 mEq/L (ref 96–112)
Creatinine, Ser: 0.91 mg/dL (ref 0.50–1.10)
GFR calc Af Amer: 87 mL/min — ABNORMAL LOW (ref >90)
GFR calc non Af Amer: 75 mL/min — ABNORMAL LOW (ref >90)
Glucose, Bld: 107 mg/dL — ABNORMAL HIGH (ref 70–99)
POTASSIUM: 4 meq/L (ref 3.7–5.3)
SODIUM: 138 meq/L (ref 137–147)
TOTAL PROTEIN: 7.2 g/dL (ref 6.0–8.3)
Total Bilirubin: 0.3 mg/dL (ref 0.3–1.2)

## 2014-03-07 LAB — CBC
HCT: 37.8 % (ref 36.0–46.0)
Hemoglobin: 13 g/dL (ref 12.0–15.0)
MCH: 29.5 pg (ref 26.0–34.0)
MCHC: 34.4 g/dL (ref 30.0–36.0)
MCV: 85.7 fL (ref 78.0–100.0)
Platelets: 271 10*3/uL (ref 150–400)
RBC: 4.41 MIL/uL (ref 3.87–5.11)
RDW: 13.7 % (ref 11.5–15.5)
WBC: 5.9 10*3/uL (ref 4.0–10.5)

## 2014-03-07 LAB — URINE MICROSCOPIC-ADD ON

## 2014-03-07 LAB — POCT PREGNANCY, URINE: PREG TEST UR: NEGATIVE

## 2014-03-07 MED ORDER — HYDROCODONE-ACETAMINOPHEN 5-325 MG PO TABS
2.0000 | ORAL_TABLET | Freq: Once | ORAL | Status: AC
  Administered 2014-03-07: 2 via ORAL
  Filled 2014-03-07: qty 2

## 2014-03-07 NOTE — Discharge Instructions (Signed)

## 2014-03-07 NOTE — MAU Note (Signed)
RLQ pain tonight. Denies vaginal bleeding/vaginal discharge/dysuria/fever/nausea/vomiting/diarrhea/constipation.

## 2014-03-07 NOTE — MAU Provider Note (Signed)
History     CSN: [id number]  Arrival date and time: 03/07/14 0112   First Provider Initiated Contact with Patient 03/07/14 0207      Chief Complaint  Patient presents with  . Abdominal Pain   Abdominal Pain    [name] is a 43 y.o. [id number] who presents today with RLQ pain. She states that the pain started around 2300. She has not taken anything for the pain at this time. She denies any fever, nausea or vomiting. She states that she had a similar pain in 2007, but there was never any cause found for the pain.    Past Medical History  Diagnosis Date  . Pulmonary embolism 2005       . Pulmonary hypertension     2/2 chronic PE. Sees Dr. Thom Chimes, pulmonologist at [facility]  . Primary hypercoagulable state   . Hepatitis B infection     Hep B core Ab 04/2005  . DVT (deep venous thrombosis)   . Seasonal allergies     Past Surgical History  Procedure Laterality Date  . Pulmonary artery endarterectomy  2007  . Cardiac catheterization  11/2005    R heart cath : Severe pulmonary arterial HTN with evidence of cor pulmonale. Vasodilatory challenge not performed.    Family History  Problem Relation Age of Onset  . Hypertension Mother   . Hypertension Father     deceased  . Alcohol abuse Neg Hx   . Cancer Neg Hx   . Diabetes Neg Hx   . Early death Neg Hx   . Heart disease Neg Hx   . Hyperlipidemia Neg Hx   . Kidney disease Neg Hx   . Stroke Neg Hx     History  Substance Use Topics  . Smoking status: Never Smoker   . Smokeless tobacco: Never Used  . Alcohol Use: No    Allergies:  Allergies  Allergen Reactions  . Aspirin Other (See Comments)    Since being in [place], told she has an ulcer    Prescriptions prior to admission  Medication Sig Dispense Refill  . acetaminophen (TYLENOL) 325 MG tablet Take 650 mg by mouth every 8 (eight) hours as needed. For pain      . albuterol (PROVENTIL HFA;VENTOLIN HFA) 108 (90 BASE) MCG/ACT inhaler Inhale 2 puffs into the  lungs every 6 (six) hours as needed. For shortness of breath or wheezing  1 Inhaler  6  . fluticasone (FLONASE) 50 MCG/ACT nasal spray Place 2 sprays into the nose daily.  64 g  0  . Multiple Vitamin (MULTIVITAMIN WITH MINERALS) TABS Take 1 tablet by mouth daily. Centrum      . sodium chloride (OCEAN) 0.65 % SOLN nasal spray Place 1 spray into the nose every 6 (six) hours as needed. For dry nose  480 mL  0  . spironolactone (ALDACTONE) 25 MG tablet TAKE 1 TABLET BY MOUTH ONCE DAILY  90 tablet  3  . XARELTO 20 MG TABS tablet TAKE 1 TABLET (20 MG TOTAL) BY MOUTH EVERY MORNING.  120 tablet  2    Review of Systems  Gastrointestinal: Positive for abdominal pain.   Physical Exam   Blood pressure 118/78, pulse 83, temperature 98.6 F (37 C), temperature source Oral, resp. rate 20, last menstrual period 03/01/2014, SpO2 100.00%.  Physical Exam  Nursing note and vitals reviewed. Constitutional: She is oriented to person, place, and time. She appears well-developed and well-nourished. No distress.  Cardiovascular: Normal rate.   Respiratory:  Effort normal.  GI: Soft. Bowel sounds are normal. She exhibits no distension and no mass. There is tenderness (slightly tender in the RLQ ). There is no rebound and no guarding.  Neurological: She is alert and oriented to person, place, and time.  Skin: Skin is warm and dry.  Psychiatric: She has a normal mood and affect.    MAU Course  Procedures Results for orders placed during the hospital encounter of 03/07/14 (from the past 24 hour(s))  URINALYSIS, ROUTINE W REFLEX MICROSCOPIC     Status: Abnormal   Collection Time    03/07/14  1:12 AM      Result Value Ref Range   Color, Urine YELLOW  YELLOW   APPearance CLEAR  CLEAR   Specific Gravity, Urine 1.010  1.005 - 1.030   pH 7.0  5.0 - 8.0   Glucose, UA NEGATIVE  NEGATIVE mg/dL   Hgb urine dipstick LARGE (*) NEGATIVE   Bilirubin Urine NEGATIVE  NEGATIVE   Ketones, ur NEGATIVE  NEGATIVE mg/dL    Protein, ur NEGATIVE  NEGATIVE mg/dL   Urobilinogen, UA 0.2  0.0 - 1.0 mg/dL   Nitrite NEGATIVE  NEGATIVE   Leukocytes, UA NEGATIVE  NEGATIVE  URINE MICROSCOPIC-ADD ON     Status: Abnormal   Collection Time    03/07/14  1:12 AM      Result Value Ref Range   Squamous Epithelial / LPF RARE  RARE   WBC, UA 0-2  <3 WBC/hpf   RBC / HPF 7-10  <3 RBC/hpf   Bacteria, UA FEW (*) RARE  POCT PREGNANCY, URINE     Status: None   Collection Time    03/07/14  1:40 AM      Result Value Ref Range   Preg Test, Ur NEGATIVE  NEGATIVE  CBC     Status: None   Collection Time    03/07/14  1:47 AM      Result Value Ref Range   WBC 5.9  4.0 - 10.5 K/uL   RBC 4.41  3.87 - 5.11 MIL/uL   Hemoglobin 13.0  12.0 - 15.0 g/dL   HCT 37.8  36.0 - 46.0 %   MCV 85.7  78.0 - 100.0 fL   MCH 29.5  26.0 - 34.0 pg   MCHC 34.4  30.0 - 36.0 g/dL   RDW 13.7  11.5 - 15.5 %   Platelets 271  150 - 400 K/uL  COMPREHENSIVE METABOLIC PANEL     Status: Abnormal   Collection Time    03/07/14  1:47 AM      Result Value Ref Range   Sodium 138  137 - 147 mEq/L   Potassium 4.0  3.7 - 5.3 mEq/L   Chloride 100  96 - 112 mEq/L   CO2 29  19 - 32 mEq/L   Glucose, Bld 107 (*) 70 - 99 mg/dL   BUN 15  6 - 23 mg/dL   Creatinine, Ser 0.91  0.50 - 1.10 mg/dL   Calcium 9.3  8.4 - 10.5 mg/dL   Total Protein 7.2  6.0 - 8.3 g/dL   Albumin 3.4 (*) 3.5 - 5.2 g/dL   AST 22  0 - 37 U/L   ALT 14  0 - 35 U/L   Alkaline Phosphatase 77  39 - 117 U/L   Total Bilirubin 0.3  0.3 - 1.2 mg/dL   GFR calc non Af Amer 75 (*) >90 mL/min   GFR calc Af Amer 87 (*) >90 mL/min  Anion gap 9  5 - 15   Assessment and Plan   1. RLQ abdominal pain   UC pending Labs normal today, afebrile Discussed return precautions including worsening pain, fever, nausea/vomiting  Follow-up Information   Follow up with [facility]. (If symptoms worsen)    Contact information:   [place] [phone]        Mathis Bud 03/07/2014, 2:15 AM

## 2014-03-08 LAB — URINE CULTURE: Colony Count: >=100000

## 2014-03-18 ENCOUNTER — Other Ambulatory Visit: Payer: Self-pay | Admitting: Pulmonary Disease

## 2014-03-18 DIAGNOSIS — Z1231 Encounter for screening mammogram for malignant neoplasm of breast: Secondary | ICD-10-CM

## 2014-04-18 ENCOUNTER — Emergency Department (HOSPITAL_COMMUNITY)
Admission: EM | Admit: 2014-04-18 | Discharge: 2014-04-18 | Disposition: A | Discharge status: Home / Self Care | Payer: 59 | Attending: Emergency Medicine | Admitting: Emergency Medicine

## 2014-04-18 ENCOUNTER — Emergency Department (HOSPITAL_COMMUNITY): Payer: 59

## 2014-04-18 ENCOUNTER — Encounter (HOSPITAL_COMMUNITY): Payer: Self-pay | Admitting: Emergency Medicine

## 2014-04-18 DIAGNOSIS — R109 Unspecified abdominal pain: Secondary | ICD-10-CM | POA: Insufficient documentation

## 2014-04-18 DIAGNOSIS — Z86718 Personal history of other venous thrombosis and embolism: Secondary | ICD-10-CM | POA: Insufficient documentation

## 2014-04-18 DIAGNOSIS — Z79899 Other long term (current) drug therapy: Secondary | ICD-10-CM | POA: Insufficient documentation

## 2014-04-18 DIAGNOSIS — Z8619 Personal history of other infectious and parasitic diseases: Secondary | ICD-10-CM | POA: Diagnosis not present

## 2014-04-18 DIAGNOSIS — Z862 Personal history of diseases of the blood and blood-forming organs and certain disorders involving the immune mechanism: Secondary | ICD-10-CM | POA: Insufficient documentation

## 2014-04-18 DIAGNOSIS — Z3202 Encounter for pregnancy test, result negative: Secondary | ICD-10-CM | POA: Diagnosis not present

## 2014-04-18 DIAGNOSIS — Z7901 Long term (current) use of anticoagulants: Secondary | ICD-10-CM | POA: Insufficient documentation

## 2014-04-18 DIAGNOSIS — IMO0002 Reserved for concepts with insufficient information to code with codable children: Secondary | ICD-10-CM | POA: Diagnosis not present

## 2014-04-18 DIAGNOSIS — Z8709 Personal history of other diseases of the respiratory system: Secondary | ICD-10-CM | POA: Diagnosis not present

## 2014-04-18 DIAGNOSIS — Z8679 Personal history of other diseases of the circulatory system: Secondary | ICD-10-CM | POA: Diagnosis not present

## 2014-04-18 DIAGNOSIS — Z86711 Personal history of pulmonary embolism: Secondary | ICD-10-CM | POA: Diagnosis not present

## 2014-04-18 DIAGNOSIS — Z9889 Other specified postprocedural states: Secondary | ICD-10-CM | POA: Diagnosis not present

## 2014-04-18 LAB — URINALYSIS, ROUTINE W REFLEX MICROSCOPIC
Bilirubin Urine: NEGATIVE
Glucose, UA: NEGATIVE mg/dL
Ketones, ur: NEGATIVE mg/dL
NITRITE: NEGATIVE
Protein, ur: NEGATIVE mg/dL
SPECIFIC GRAVITY, URINE: 1.011 (ref 1.005–1.030)
UROBILINOGEN UA: 0.2 mg/dL (ref 0.0–1.0)
pH: 7.5 (ref 5.0–8.0)

## 2014-04-18 LAB — BASIC METABOLIC PANEL
ANION GAP: 13 (ref 5–15)
BUN: 9 mg/dL (ref 6–23)
CHLORIDE: 103 meq/L (ref 96–112)
CO2: 23 mEq/L (ref 19–32)
CREATININE: 0.96 mg/dL (ref 0.50–1.10)
Calcium: 9.1 mg/dL (ref 8.4–10.5)
GFR, EST AFRICAN AMERICAN: 82 mL/min — AB (ref >90)
GFR, EST NON AFRICAN AMERICAN: 70 mL/min — AB (ref >90)
Glucose, Bld: 92 mg/dL (ref 70–99)
POTASSIUM: 4 meq/L (ref 3.7–5.3)
Sodium: 139 mEq/L (ref 137–147)

## 2014-04-18 LAB — URINE MICROSCOPIC-ADD ON

## 2014-04-18 LAB — POC URINE PREG, ED: Preg Test, Ur: NEGATIVE

## 2014-04-18 IMAGING — CT CT ABD-PELV W/O CM
2 of 4 series · 12 of 46 positions shown, 14 images · non-contrast
Comparison: [DATE]

CLINICAL DATA: Left flank pain, hematuria

EXAM:
CT ABDOMEN AND PELVIS WITHOUT CONTRAST
TECHNIQUE: Multidetector CT imaging of the abdomen and pelvis was performed
following the standard protocol without IV contrast.

[Series 201: routine, idose (2) · axial · 0.78mm/px · z∈[-430,-55]mm · 9 of 91 slices shown, 11 images]
[im 8/91  soft-tissue]
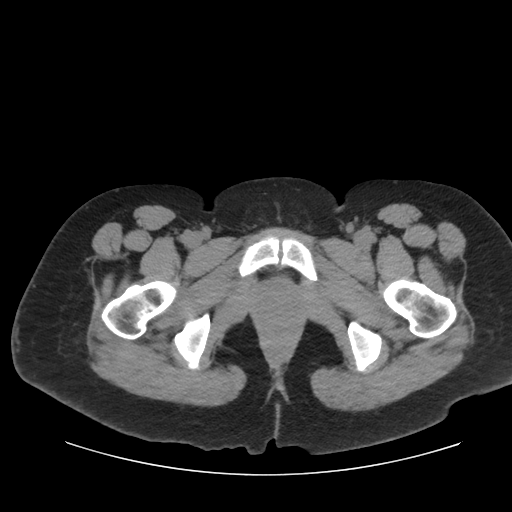
[im 8/91  bone]
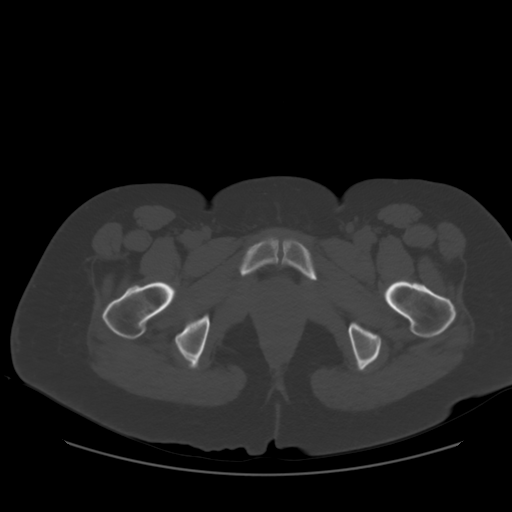
[im 19/91  soft-tissue]
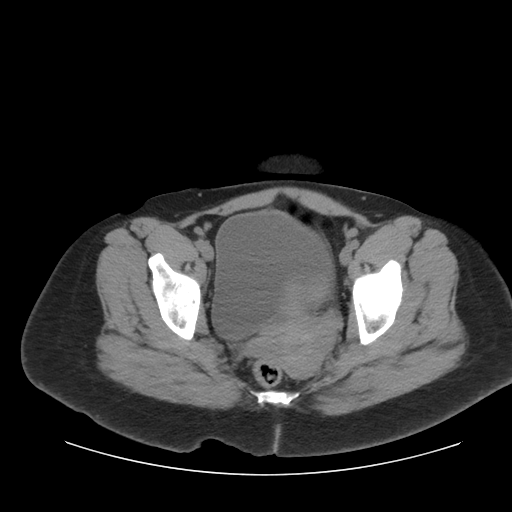
[im 26/91  soft-tissue]
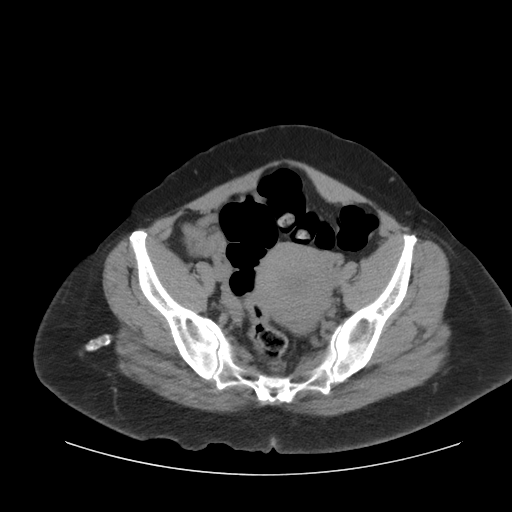
[im 37/91  soft-tissue]
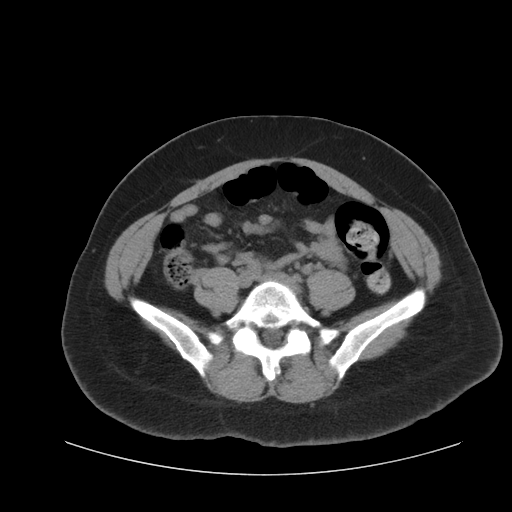
[im 47/91  soft-tissue]
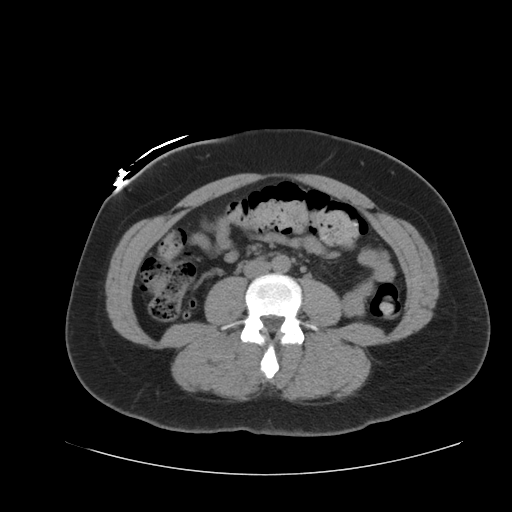
[im 55/91  soft-tissue]
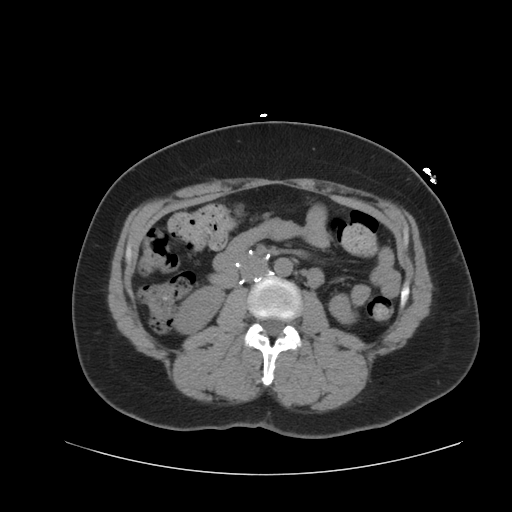
[im 65/91  soft-tissue]
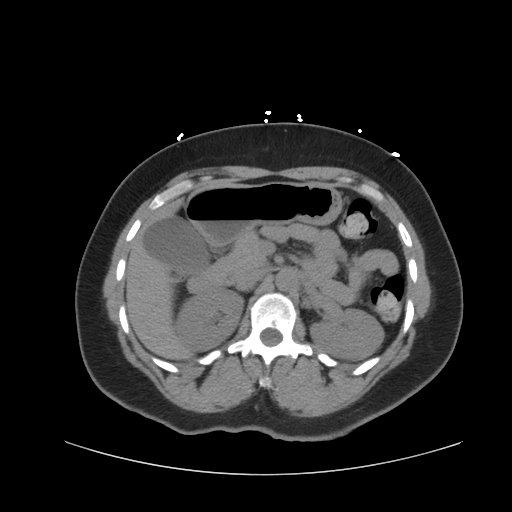
[im 76/91  soft-tissue]
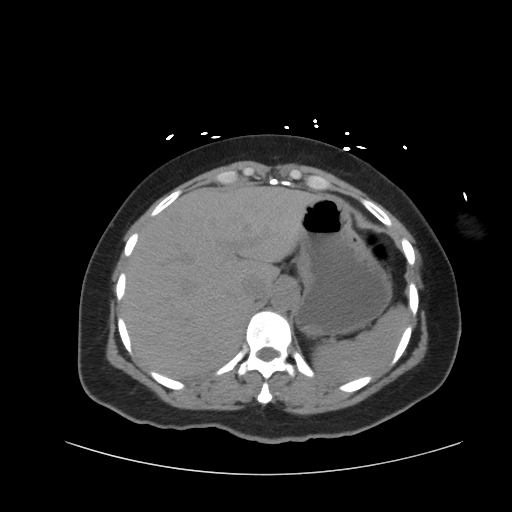
[im 83/91  soft-tissue]
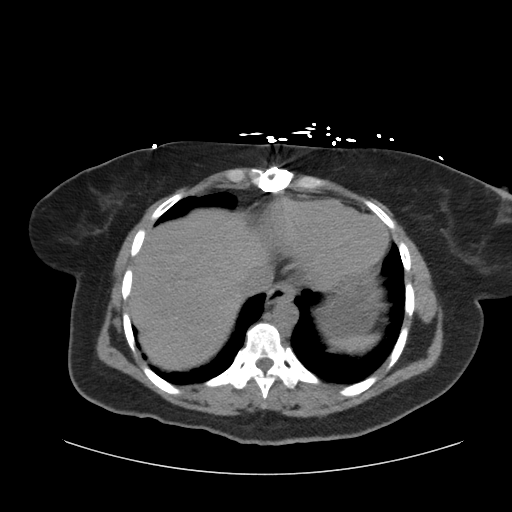
[im 83/91  bone]
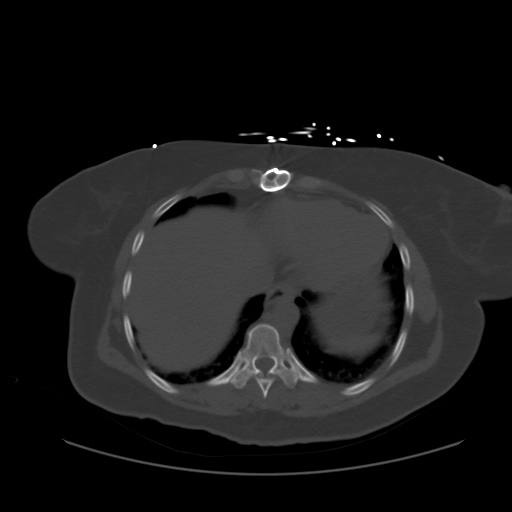

[Series 203: coronals, idose (2) · coronal · 0.50mm/px · 3 of 116 slices shown]
[im 52/116  soft-tissue]
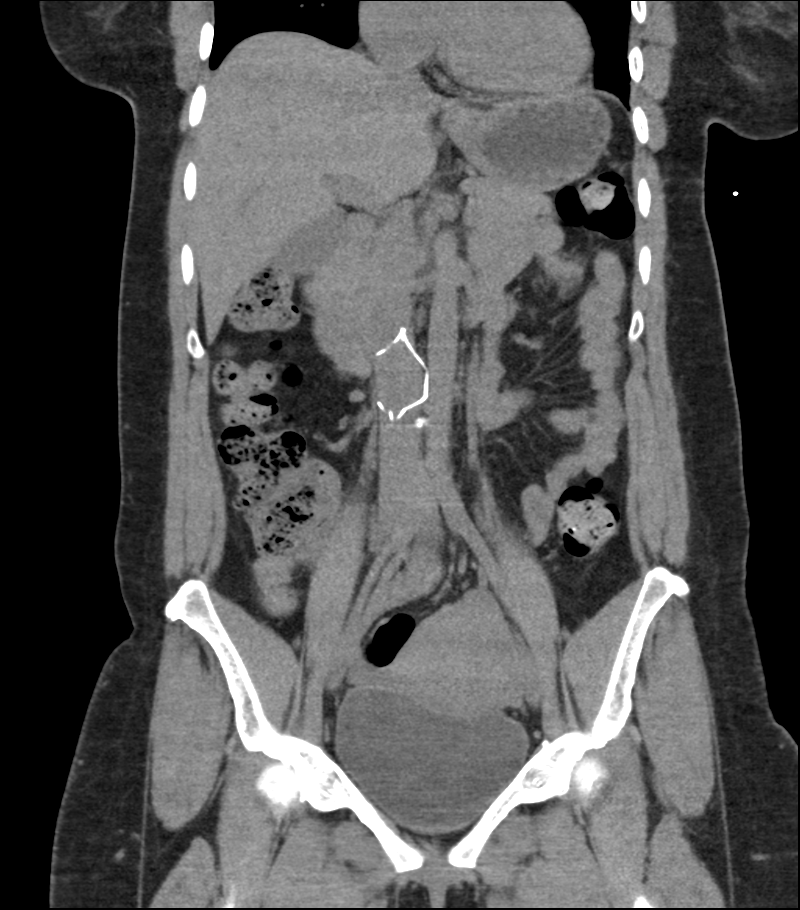
[im 64/116  soft-tissue]
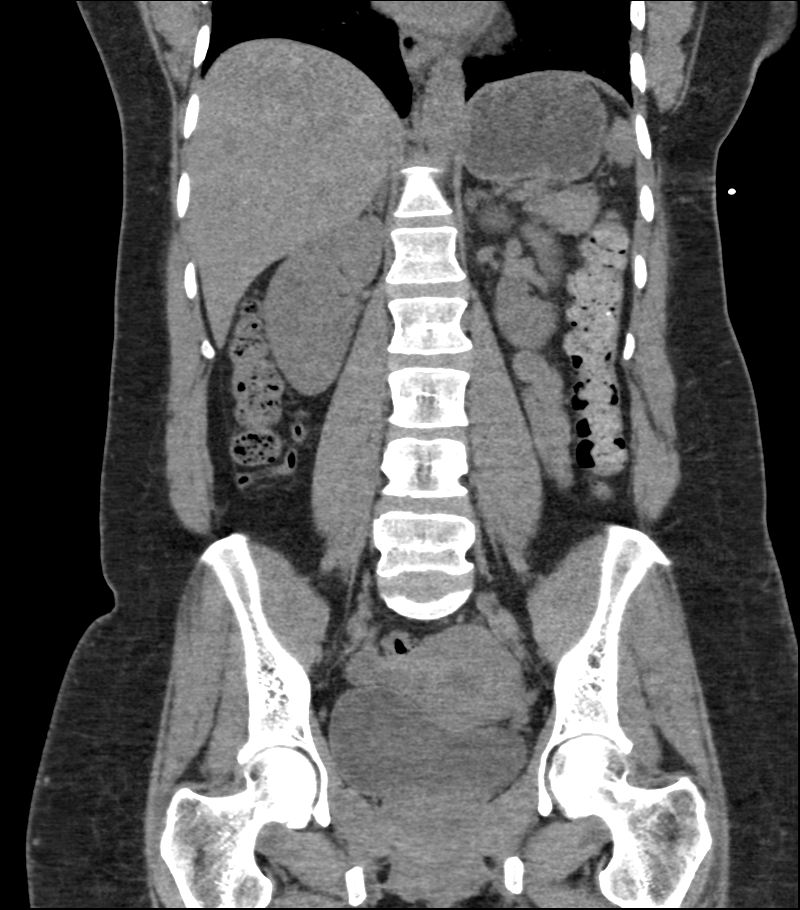
[im 77/116  soft-tissue]
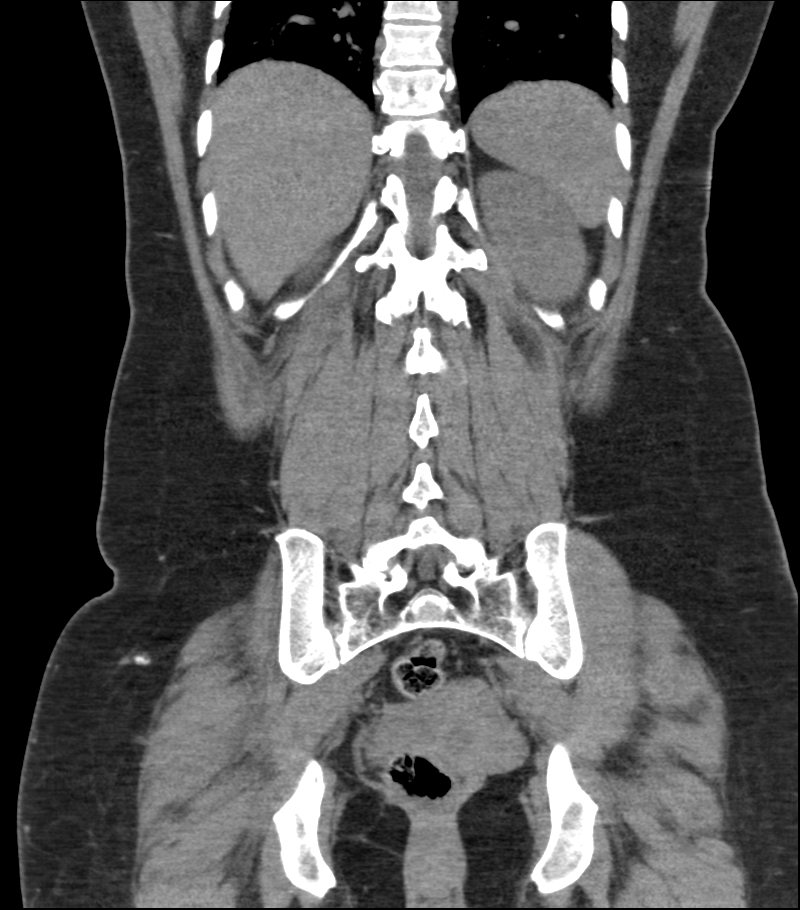

[12 of 46 positions shown; findings below may reference images not displayed]

FINDINGS: Lung bases shows bilateral posterior atelectasis. Stable scarring
right base anterolaterally.

Sagittal images of the spine are unremarkable.

Unenhanced liver shows no biliary ductal dilatation. No calcified
gallstones are noted within gallbladder. Unenhanced pancreas spleen
and adrenal glands are unremarkable. IVC filter in place. Unenhanced
kidneys are symmetrical in size. No nephrolithiasis. No
hydronephrosis or hydroureter.

No calcified ureteral calculi are noted.

A right renal cyst is stable in size measures 1 cm.

The unenhanced uterus is unremarkable. No adnexal mass. Bilateral
distal ureter is unremarkable. The uterus is anteflexed. No
calcified calculi are noted within urinary bladder.

No small bowel obstruction. Normal appendix. No pericecal
inflammation. No ascites or free air. No adenopathy.
IMPRESSION: 1. No nephrolithiasis.  No hydronephrosis or hydroureter.
2. Stable right renal cysts.
3. No calcified ureteral calculi.
4. No pericecal inflammation.  Normal appendix.
5. IVC filter in place.

## 2014-04-18 MED ORDER — HYDROCODONE-ACETAMINOPHEN 5-325 MG PO TABS: 1.0000 | ORAL_TABLET | Freq: Four times a day (QID) | ORAL | Status: DC | PRN

## 2014-04-18 MED ORDER — DIAZEPAM 5 MG PO TABS: 5.0000 mg | ORAL_TABLET | Freq: Two times a day (BID) | ORAL | Status: DC

## 2014-04-18 MED ORDER — HYDROMORPHONE HCL 1 MG/ML IJ SOLN
1.0000 mg | Freq: Once | INTRAMUSCULAR | Status: AC
  Administered 2014-04-18: 1 mg via INTRAVENOUS
  Filled 2014-04-18: qty 1

## 2014-04-18 MED ORDER — KETOROLAC TROMETHAMINE 30 MG/ML IJ SOLN: 30.0000 mg | Freq: Once | INTRAMUSCULAR | Status: DC

## 2014-04-18 MED ORDER — HYDROMORPHONE HCL 1 MG/ML IJ SOLN: 1.0000 mg | Freq: Once | INTRAMUSCULAR | Status: DC

## 2014-04-18 MED ORDER — KETOROLAC TROMETHAMINE 30 MG/ML IJ SOLN
30.0000 mg | Freq: Once | INTRAMUSCULAR | Status: AC
  Administered 2014-04-18: 30 mg via INTRAVENOUS
  Filled 2014-04-18: qty 1

## 2014-04-18 MED ORDER — DIAZEPAM 5 MG PO TABS
5.0000 mg | ORAL_TABLET | Freq: Once | ORAL | Status: AC
  Administered 2014-04-18: 5 mg via ORAL
  Filled 2014-04-18: qty 1

## 2014-04-18 NOTE — ED Notes (Signed)
C/o right sided flank pain, sudden onset yesterday, no V/D or urinary s/s, no meds pts, A/O X4, ambulatory and in NAD

## 2014-04-18 NOTE — ED Provider Notes (Signed)
CSN: [id number]     Arrival date & time 04/18/14  1497 History   First MD Initiated Contact with Patient 04/18/14 0800     Chief Complaint  Patient presents with  . Flank Pain     (Consider location/radiation/quality/duration/timing/severity/associated sxs/prior Treatment) HPI Patient presents with low back pain, right greater than left.  Onset was yesterday.  Since onset pain has been severe, worse with attempts to ambulate.  Pain is also worse with hip extension or flexion. No new abdominal pain, urinary complaints, incontinence, loss of sensation in her legs. Patient has multiple medical problems, including PE, DVT, states that she takes her anticoagulant regularly. She denies size change in her right lower leg. She denies no dyspnea, chest pain, lightheadedness, syncope.  Past Medical History  Diagnosis Date  . Pulmonary embolism 2005       . Pulmonary hypertension     2/2 chronic PE. Sees Dr. Thom Chimes, pulmonologist at [facility]  . Primary hypercoagulable state   . Hepatitis B infection     Hep B core Ab 04/2005  . DVT (deep venous thrombosis)   . Seasonal allergies    Past Surgical History  Procedure Laterality Date  . Pulmonary artery endarterectomy  2007  . Cardiac catheterization  11/2005    R heart cath : Severe pulmonary arterial HTN with evidence of cor pulmonale. Vasodilatory challenge not performed.   Family History  Problem Relation Age of Onset  . Hypertension Mother   . Hypertension Father     deceased  . Alcohol abuse Neg Hx   . Cancer Neg Hx   . Diabetes Neg Hx   . Early death Neg Hx   . Heart disease Neg Hx   . Hyperlipidemia Neg Hx   . Kidney disease Neg Hx   . Stroke Neg Hx    History  Substance Use Topics  . Smoking status: Never Smoker   . Smokeless tobacco: Never Used  . Alcohol Use: No   OB History   Grav Para Term Preterm Abortions TAB SAB Ect Mult Living   4 3 3  1  1   3      Review of Systems  Constitutional:       Per HPI,  otherwise negative  HENT:       Per HPI, otherwise negative  Respiratory:       Per HPI, otherwise negative  Cardiovascular:       Per HPI, otherwise negative  Gastrointestinal: Negative for vomiting.  Endocrine:       Negative aside from HPI  Genitourinary:       Neg aside from HPI   Musculoskeletal:       Per HPI, otherwise negative  Skin: Negative.   Neurological: Negative for syncope.      Allergies  Aspirin  Home Medications   Prior to Admission medications   Medication Sig Start Date End Date Taking? Authorizing Provider  albuterol (PROVENTIL HFA;VENTOLIN HFA) 108 (90 BASE) MCG/ACT inhaler Inhale 2 puffs into the lungs daily as needed for shortness of breath. For shortness of breath or wheezing 02/09/13  Yes Chesley Mires, MD  fluticasone (FLONASE) 50 MCG/ACT nasal spray Place 2 sprays into the nose 2 (two) times a week. 05/16/13 07/23/15 Yes Chesley Mires, MD  Multiple Vitamin (MULTIVITAMIN WITH MINERALS) TABS Take 1 tablet by mouth daily. Centrum   Yes Historical Provider, MD  rivaroxaban (XARELTO) 20 MG TABS tablet Take 20 mg by mouth daily. TAKE 1 TABLET (20 MG TOTAL) BY  MOUTH EVERY MORNING. 02/20/14  Yes Chesley Mires, MD  sodium chloride (OCEAN) 0.65 % SOLN nasal spray Place 2 sprays into the nose. For dry nose 05/16/13 04/18/14 Yes Chesley Mires, MD  spironolactone (ALDACTONE) 25 MG tablet Take 25 mg by mouth daily.   Yes Historical Provider, MD  acetaminophen (TYLENOL) 325 MG tablet Take 325 mg by mouth daily as needed for moderate pain. For pain    Historical Provider, MD  diazepam (VALIUM) 5 MG tablet Take 1 tablet (5 mg total) by mouth 2 (two) times daily. 04/18/14   Carmin Muskrat, MD  HYDROcodone-acetaminophen (NORCO/VICODIN) 5-325 MG per tablet Take 1 tablet by mouth every 6 (six) hours as needed for moderate pain or severe pain. 04/18/14   Carmin Muskrat, MD   BP 105/69  Pulse 72  Temp(Src) 98.2 F (36.8 C) (Oral)  Resp 18  Ht 5' 4"  (1.626 m)  Wt 178 lb (80.74 kg)   BMI 30.54 kg/m2  SpO2 98%  LMP 03/28/2014 Physical Exam  Nursing note and vitals reviewed. Constitutional: She is oriented to person, place, and time. She appears well-developed and well-nourished. No distress.  HENT:  Head: Normocephalic and atraumatic.  Eyes: Conjunctivae and EOM are normal.  Cardiovascular: Normal rate and regular rhythm.   Pulmonary/Chest: Effort normal and breath sounds normal. No stridor. No respiratory distress.  Abdominal: She exhibits no distension. There is no tenderness. There is no rebound.  Musculoskeletal: She exhibits no edema.       Arms: Neurological: She is alert and oriented to person, place, and time. She displays no atrophy and no tremor. No cranial nerve deficit or sensory deficit. She exhibits normal muscle tone. She displays no seizure activity. Coordination normal.  Skin: Skin is warm and dry.  Psychiatric: She has a normal mood and affect.    ED Course  Procedures (including critical care time) Labs Review Labs Reviewed  BASIC METABOLIC PANEL - Abnormal; Notable for the following:    GFR calc non Af Amer 70 (*)    GFR calc Af Amer 82 (*)    All other components within normal limits  URINALYSIS, ROUTINE W REFLEX MICROSCOPIC - Abnormal; Notable for the following:    APPearance CLOUDY (*)    Hgb urine dipstick SMALL (*)    Leukocytes, UA LARGE (*)    All other components within normal limits  URINE MICROSCOPIC-ADD ON - Abnormal; Notable for the following:    Squamous Epithelial / LPF MANY (*)    All other components within normal limits  POC URINE PREG, ED    Imaging Review Ct Abdomen Pelvis Wo Contrast  04/18/2014   CLINICAL DATA:  Left flank pain, hematuria  EXAM: CT ABDOMEN AND PELVIS WITHOUT CONTRAST  TECHNIQUE: Multidetector CT imaging of the abdomen and pelvis was performed following the standard protocol without IV contrast.  COMPARISON:  01/20/2006  FINDINGS: Lung bases shows bilateral posterior atelectasis. Stable scarring  right base anterolaterally.  Sagittal images of the spine are unremarkable.  Unenhanced liver shows no biliary ductal dilatation. No calcified gallstones are noted within gallbladder. Unenhanced pancreas spleen and adrenal glands are unremarkable. IVC filter in place. Unenhanced kidneys are symmetrical in size. No nephrolithiasis. No hydronephrosis or hydroureter.  No calcified ureteral calculi are noted.  A right renal cyst is stable in size measures 1 cm.  The unenhanced uterus is unremarkable. No adnexal mass. Bilateral distal ureter is unremarkable. The uterus is anteflexed. No calcified calculi are noted within urinary bladder.  No small bowel obstruction.  Normal appendix. No pericecal inflammation. No ascites or free air. No adenopathy.  IMPRESSION: 1. No nephrolithiasis.  No hydronephrosis or hydroureter. 2. Stable right renal cysts. 3. No calcified ureteral calculi. 4. No pericecal inflammation.  Normal appendix. 5. IVC filter in place.   Electronically Signed   By: Lahoma Crocker M.D.   On: 04/18/2014 13:45    On repeat exam the patient still is in pain.  Given the patient's description of flank pain, CT scan will be performed.  On repeat exam the patient is now sleeping, pain seemingly much better. I discussed all findings with patient and her husband. Patient has a primary care followup capacity.   MDM   Final diagnoses:  Flank pain    Patient presents with new low back, asymmetric pain, including flank pain. Patient is awake and alert, neurologically intact and hemodynamically stable, though she has substantial pain that does not initially improve with analgesia in the emergency department. Patient does have multiple medical problems, CT scan was performed. CT scan, labs largely reassuring, and with movement in her pain, no decompensation, stable vital signs, is low suspicion for occult infection or hemorrhage or neurologic compromise. Patient was discharged with analgesics to follow up  with primary care.    Carmin Muskrat, MD 04/18/14 1524

## 2014-04-18 NOTE — Discharge Instructions (Signed)
As discussed, your evaluation today has been largely reassuring.  But, it is important that you monitor your condition carefully, and do not hesitate to return to the ED if you develop new, or concerning changes in your condition. ? ?Otherwise, please follow-up with your physician for appropriate ongoing care. ? ?

## 2014-04-22 ENCOUNTER — Emergency Department (HOSPITAL_COMMUNITY)
Admission: EM | Admit: 2014-04-22 | Discharge: 2014-04-22 | Disposition: A | Discharge status: Home / Self Care | Payer: 59 | Attending: Emergency Medicine | Admitting: Emergency Medicine

## 2014-04-22 ENCOUNTER — Encounter (HOSPITAL_COMMUNITY): Payer: Self-pay | Admitting: Emergency Medicine

## 2014-04-22 DIAGNOSIS — M549 Dorsalgia, unspecified: Secondary | ICD-10-CM | POA: Diagnosis present

## 2014-04-22 DIAGNOSIS — Z862 Personal history of diseases of the blood and blood-forming organs and certain disorders involving the immune mechanism: Secondary | ICD-10-CM | POA: Insufficient documentation

## 2014-04-22 DIAGNOSIS — Z8709 Personal history of other diseases of the respiratory system: Secondary | ICD-10-CM | POA: Insufficient documentation

## 2014-04-22 DIAGNOSIS — M5442 Lumbago with sciatica, left side: Secondary | ICD-10-CM

## 2014-04-22 DIAGNOSIS — Z79899 Other long term (current) drug therapy: Secondary | ICD-10-CM | POA: Diagnosis not present

## 2014-04-22 DIAGNOSIS — IMO0002 Reserved for concepts with insufficient information to code with codable children: Secondary | ICD-10-CM | POA: Diagnosis not present

## 2014-04-22 DIAGNOSIS — Z8679 Personal history of other diseases of the circulatory system: Secondary | ICD-10-CM | POA: Insufficient documentation

## 2014-04-22 DIAGNOSIS — M5430 Sciatica, unspecified side: Secondary | ICD-10-CM | POA: Insufficient documentation

## 2014-04-22 DIAGNOSIS — Z7901 Long term (current) use of anticoagulants: Secondary | ICD-10-CM | POA: Diagnosis not present

## 2014-04-22 DIAGNOSIS — Z8619 Personal history of other infectious and parasitic diseases: Secondary | ICD-10-CM | POA: Insufficient documentation

## 2014-04-22 DIAGNOSIS — Z86711 Personal history of pulmonary embolism: Secondary | ICD-10-CM | POA: Diagnosis not present

## 2014-04-22 DIAGNOSIS — Z86718 Personal history of other venous thrombosis and embolism: Secondary | ICD-10-CM | POA: Diagnosis not present

## 2014-04-22 DIAGNOSIS — M5441 Lumbago with sciatica, right side: Secondary | ICD-10-CM

## 2014-04-22 MED ORDER — DIAZEPAM 5 MG PO TABS: 5.0000 mg | ORAL_TABLET | Freq: Three times a day (TID) | ORAL | Status: DC | PRN

## 2014-04-22 MED ORDER — HYDROCODONE-ACETAMINOPHEN 5-325 MG PO TABS: 1.0000 | ORAL_TABLET | Freq: Four times a day (QID) | ORAL | Status: DC | PRN

## 2014-04-22 NOTE — ED Provider Notes (Signed)
CSN: [id number]     Arrival date & time 04/22/14  0730 History   First MD Initiated Contact with Patient 04/22/14 [phone]     Chief Complaint  Patient presents with  . Back Pain     (Consider location/radiation/quality/duration/timing/severity/associated sxs/prior Treatment) HPI [name] is a 43 y.o. female who presents to emergency department complaining of back pain. Patient states her back pain began 5 days ago. She denies any known injuries. States pain is in the lower back radiates down both thighs. Denies any numbness or weakness in her legs. Denies any abdominal pain. No urinary symptoms. No loss of bowels or urinary incontinence or retention. denies any fever or chills. She was seen here 3 days ago for the same pain, had full workup including urinalysis, blood work, CT of her abdomen and pelvis which all came back unremarkable. She was treated with Norco and Valium. She states she is out of Valium. She states those medications are helping some. She was told to call her primary care Dr. followup with out however she called the number she was given and they were told that they are not accepting new patients at this time. Patient states she tried calling another number and they told her her next appointment is not for another month.   Past Medical History  Diagnosis Date  . Pulmonary embolism 2005       . Pulmonary hypertension     2/2 chronic PE. Sees Dr. Thom Chimes, pulmonologist at [facility]  . Primary hypercoagulable state   . Hepatitis B infection     Hep B core Ab 04/2005  . DVT (deep venous thrombosis)   . Seasonal allergies    Past Surgical History  Procedure Laterality Date  . Pulmonary artery endarterectomy  2007  . Cardiac catheterization  11/2005    R heart cath : Severe pulmonary arterial HTN with evidence of cor pulmonale. Vasodilatory challenge not performed.   Family History  Problem Relation Age of Onset  . Hypertension Mother   . Hypertension Father    deceased  . Alcohol abuse Neg Hx   . Cancer Neg Hx   . Diabetes Neg Hx   . Early death Neg Hx   . Heart disease Neg Hx   . Hyperlipidemia Neg Hx   . Kidney disease Neg Hx   . Stroke Neg Hx    History  Substance Use Topics  . Smoking status: Never Smoker   . Smokeless tobacco: Never Used  . Alcohol Use: No   OB History   Grav Para Term Preterm Abortions TAB SAB Ect Mult Living   4 3 3  1  1   3      Review of Systems  Constitutional: Negative for fever and chills.  Respiratory: Negative for cough, chest tightness and shortness of breath.   Cardiovascular: Negative for chest pain, palpitations and leg swelling.  Gastrointestinal: Negative for nausea, vomiting, abdominal pain and diarrhea.  Genitourinary: Negative for dysuria, vaginal bleeding, vaginal discharge, vaginal pain and pelvic pain.  Musculoskeletal: Positive for arthralgias and back pain. Negative for myalgias, neck pain and neck stiffness.  Skin: Negative for rash.  Neurological: Negative for dizziness, weakness and headaches.  All other systems reviewed and are negative.     Allergies  Aspirin  Home Medications   Prior to Admission medications   Medication Sig Start Date End Date Taking? Authorizing Provider  acetaminophen (TYLENOL) 325 MG tablet Take 325 mg by mouth daily as needed for moderate pain. For  pain    Historical Provider, MD  albuterol (PROVENTIL HFA;VENTOLIN HFA) 108 (90 BASE) MCG/ACT inhaler Inhale 2 puffs into the lungs daily as needed for shortness of breath. For shortness of breath or wheezing 02/09/13   Chesley Mires, MD  diazepam (VALIUM) 5 MG tablet Take 1 tablet (5 mg total) by mouth 2 (two) times daily. 04/18/14   Carmin Muskrat, MD  fluticasone (FLONASE) 50 MCG/ACT nasal spray Place 2 sprays into the nose 2 (two) times a week. 05/16/13 07/23/15  Chesley Mires, MD  HYDROcodone-acetaminophen (NORCO/VICODIN) 5-325 MG per tablet Take 1 tablet by mouth every 6 (six) hours as needed for moderate pain  or severe pain. 04/18/14   Carmin Muskrat, MD  Multiple Vitamin (MULTIVITAMIN WITH MINERALS) TABS Take 1 tablet by mouth daily. Centrum    Historical Provider, MD  rivaroxaban (XARELTO) 20 MG TABS tablet Take 20 mg by mouth daily. TAKE 1 TABLET (20 MG TOTAL) BY MOUTH EVERY MORNING. 02/20/14   Chesley Mires, MD  spironolactone (ALDACTONE) 25 MG tablet Take 25 mg by mouth daily.    Historical Provider, MD   BP 113/74  Pulse 88  Temp(Src) 98.4 F (36.9 C) (Oral)  Resp 18  Ht 5' 4"  (1.626 m)  Wt 177 lb (80.287 kg)  BMI 30.37 kg/m2  SpO2 99%  LMP 03/28/2014 Physical Exam  Nursing note and vitals reviewed. Constitutional: She is oriented to person, place, and time. She appears well-developed and well-nourished. No distress.  HENT:  Head: Normocephalic.  Eyes: Conjunctivae are normal.  Neck: Neck supple.  Cardiovascular: Normal rate, regular rhythm and normal heart sounds.   Pulmonary/Chest: Effort normal and breath sounds normal. No respiratory distress. She has no wheezes. She has no rales.  Abdominal: Soft. Bowel sounds are normal. She exhibits no distension. There is no tenderness. There is no rebound.  Musculoskeletal: She exhibits no edema.  The leg and bilateral paravertebral lumbar spine tenderness. Tenderness to bilateral SI joints. Pain with bilateral straight leg raise.  Neurological: She is alert and oriented to person, place, and time.  5/5 and equal lower extremity strength. 2+ and equal patellar reflexes bilaterally. Pt able to dorsiflex bilateral toes and feet with good strength against resistance. Equal sensation bilaterally over thighs and lower legs.   Skin: Skin is warm and dry.  Psychiatric: She has a normal mood and affect. Her behavior is normal.    ED Course  Procedures (including critical care time) Labs Review Labs Reviewed - No data to display  Imaging Review No results found.   EKG Interpretation None      MDM   Final diagnoses:  Bilateral low back  pain with sciatica, sciatica laterality unspecified    Patient with lower back pain for 5 days ago. Was seen here 2 days ago, unremarkable labs, CT abdomen and pelvis. Pain is reproducible with palpation and movement of bilateral legs. She's afebrile. No urinary symptoms. No red flags to suggest cauda equina at this time. She is having hard time finding a primary care Dr. Brita Romp requesting a specialist referral. Will refer to orthopedics. Will treat with more muscle relaxants, pain medications, with close followup. Patient also instructed to start stretching and doing exercises. Return precautions discussed.   Filed Vitals:   04/22/14 0743  BP: 113/74  Pulse: 88  Temp: 98.4 F (36.9 C)  TempSrc: Oral  Resp: 18  Height: 5' 4"  (1.626 m)  Weight: 177 lb (80.287 kg)  SpO2: 99%       Daphane Odekirk A Deondra Labrador,  PA-C 04/22/14 1626

## 2014-04-22 NOTE — ED Notes (Signed)
Patient states lower back pain that radiates into L buttock.   Patient states she has run out of diazepam.

## 2014-04-22 NOTE — Discharge Instructions (Signed)
norco for severe pain. Valium for spasms. Rest. Stretch. Exercises. See below. Please find a primary care doctor for follow up. You can try going to a [place] urgent care as well. You can also follow up with orthopedics. Return if worsening.    Back Exercises Back exercises help treat and prevent back injuries. The goal of back exercises is to increase the strength of your abdominal and back muscles and the flexibility of your back. These exercises should be started when you no longer have back pain. Back exercises include:  Pelvic Tilt. Lie on your back with your knees bent. Tilt your pelvis until the lower part of your back is against the floor. Hold this position 5 to 10 sec and repeat 5 to 10 times.  Knee to Chest. Pull first 1 knee up against your chest and hold for 20 to 30 seconds, repeat this with the other knee, and then both knees. This may be done with the other leg straight or bent, whichever feels better.  Sit-Ups or Curl-Ups. Bend your knees 90 degrees. Start with tilting your pelvis, and do a partial, slow sit-up, lifting your trunk only 30 to 45 degrees off the floor. Take at least 2 to 3 seconds for each sit-up. Do not do sit-ups with your knees out straight. If partial sit-ups are difficult, simply do the above but with only tightening your abdominal muscles and holding it as directed.  Hip-Lift. Lie on your back with your knees flexed 90 degrees. Push down with your feet and shoulders as you raise your hips a couple inches off the floor; hold for 10 seconds, repeat 5 to 10 times.  Back arches. Lie on your stomach, propping yourself up on bent elbows. Slowly press on your hands, causing an arch in your low back. Repeat 3 to 5 times. Any initial stiffness and discomfort should lessen with repetition over time.  Shoulder-Lifts. Lie face down with arms beside your body. Keep hips and torso pressed to floor as you slowly lift your head and shoulders off the floor. Do not overdo your  exercises, especially in the beginning. Exercises may cause you some mild back discomfort which lasts for a few minutes; however, if the pain is more severe, or lasts for more than 15 minutes, do not continue exercises until you see your caregiver. Improvement with exercise therapy for back problems is slow.  See your caregivers for assistance with developing a proper back exercise program. Document Released: 08/26/2004 Document Revised: 10/11/2011 Document Reviewed: 05/20/2011 [facility] Patient Information 2015 [place], [place]. This information is not intended to replace advice given to you by your health care provider. Make sure you discuss any questions you have with your health care provider.  Back Pain, Adult Back pain is very common. The pain often gets better over time. The cause of back pain is usually not dangerous. Most people can learn to manage their back pain on their own.  HOME CARE   Stay active. Start with short walks on flat ground if you can. Try to walk farther each day.  Do not sit, drive, or stand in one place for more than 30 minutes. Do not stay in bed.  Do not avoid exercise or work. Activity can help your back heal faster.  Be careful when you bend or lift an object. Bend at your knees, keep the object close to you, and do not twist.  Sleep on a firm mattress. Lie on your side, and bend your knees. If you lie  on your back, put a pillow under your knees.  Only take medicines as told by your doctor.  Put ice on the injured area.  Put ice in a plastic bag.  Place a towel between your skin and the bag.  Leave the ice on for 15-20 minutes, 03-04 times a day for the first 2 to 3 days. After that, you can switch between ice and heat packs.  Ask your doctor about back exercises or massage.  Avoid feeling anxious or stressed. Find good ways to deal with stress, such as exercise. GET HELP RIGHT AWAY IF:   Your pain does not go away with rest or medicine.  Your pain  does not go away in 1 week.  You have new problems.  You do not feel well.  The pain spreads into your legs.  You cannot control when you poop (bowel movement) or pee (urinate).  Your arms or legs feel weak or lose feeling (numbness).  You feel sick to your stomach (nauseous) or throw up (vomit).  You have belly (abdominal) pain.  You feel like you may pass out (faint). MAKE SURE YOU:   Understand these instructions.  Will watch your condition.  Will get help right away if you are not doing well or get worse. Document Released: 01/05/2008 Document Revised: 10/11/2011 Document Reviewed: 11/20/2013 [facility] Patient Information 2015 [place], [place]. This information is not intended to replace advice given to you by your health care provider. Make sure you discuss any questions you have with your health care provider.

## 2014-04-22 NOTE — ED Provider Notes (Signed)
Medical screening examination/treatment/procedure(s) were performed by non-physician practitioner and as supervising physician I was immediately available for consultation/collaboration.  Ephraim Hamburger, MD 04/22/14 [phone]

## 2014-04-29 ENCOUNTER — Ambulatory Visit (HOSPITAL_COMMUNITY)
Admission: RE | Admit: 2014-04-29 | Discharge: 2014-04-29 | Disposition: A | Discharge status: Home / Self Care | Payer: 59 | Source: Ambulatory Visit | Attending: Pulmonary Disease | Admitting: Pulmonary Disease

## 2014-04-29 DIAGNOSIS — Z1231 Encounter for screening mammogram for malignant neoplasm of breast: Secondary | ICD-10-CM | POA: Diagnosis present

## 2014-04-29 IMAGING — MG MM DIGITAL SCREENING BILAT
5 series · 5 of 5 positions shown · non-contrast
Comparison: Previous exam(s).

CLINICAL DATA: Screening.

EXAM:
DIGITAL SCREENING BILATERAL MAMMOGRAM WITH CAD

[L MLO]
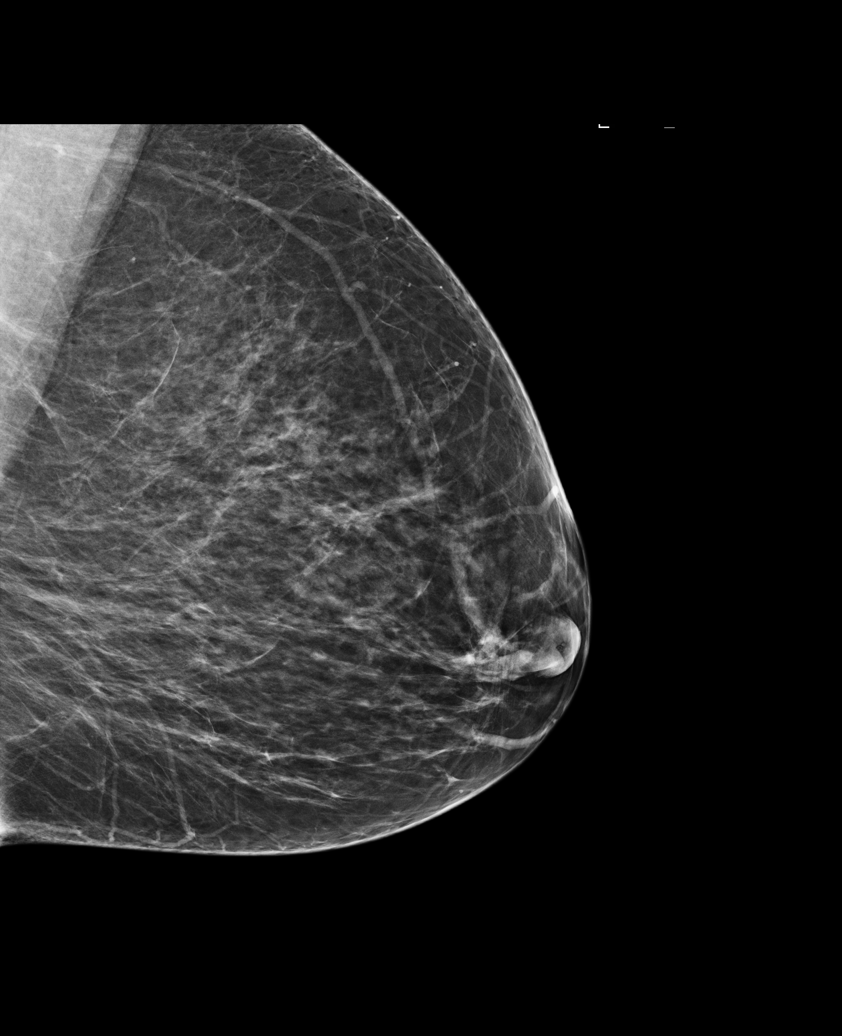

[R CC (1 of 2)]
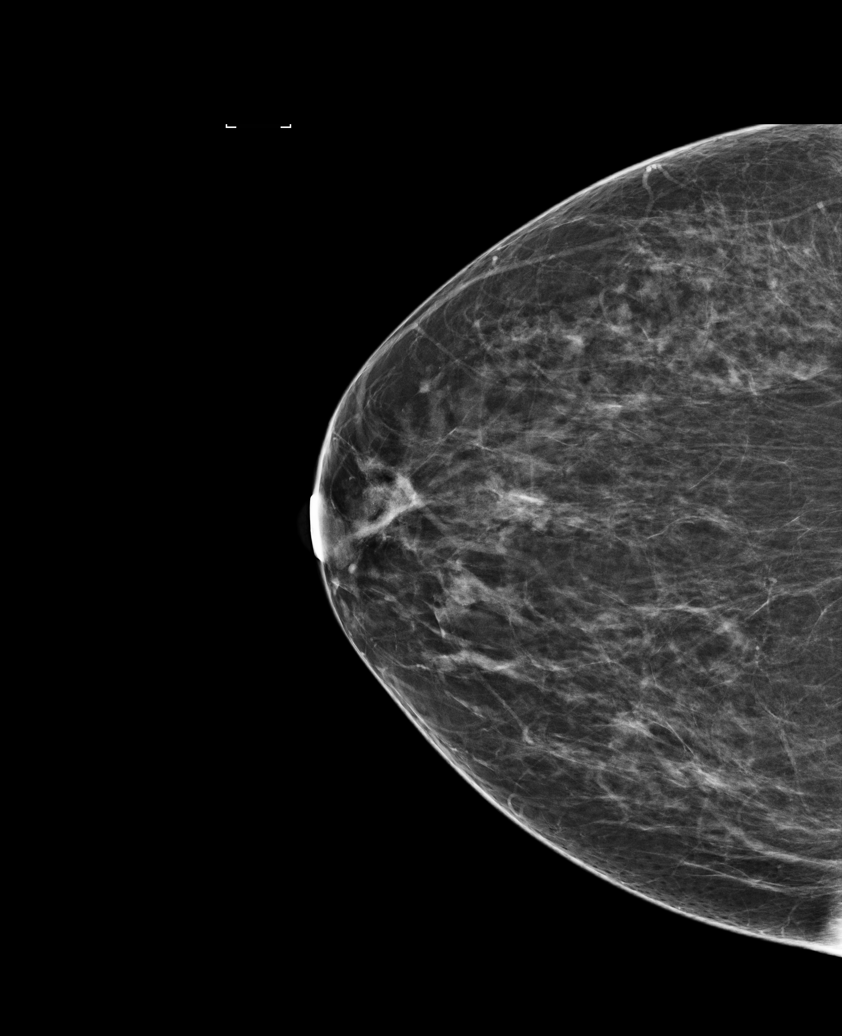

[R CC (2 of 2)]
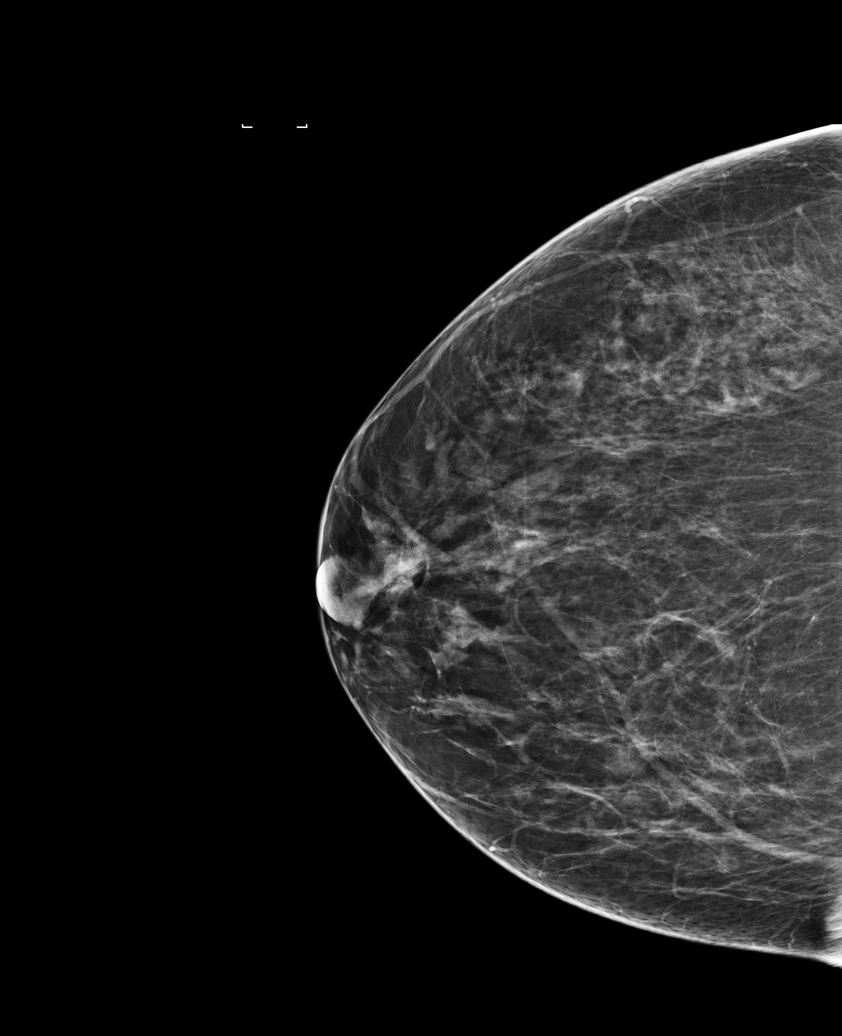

[R MLO]
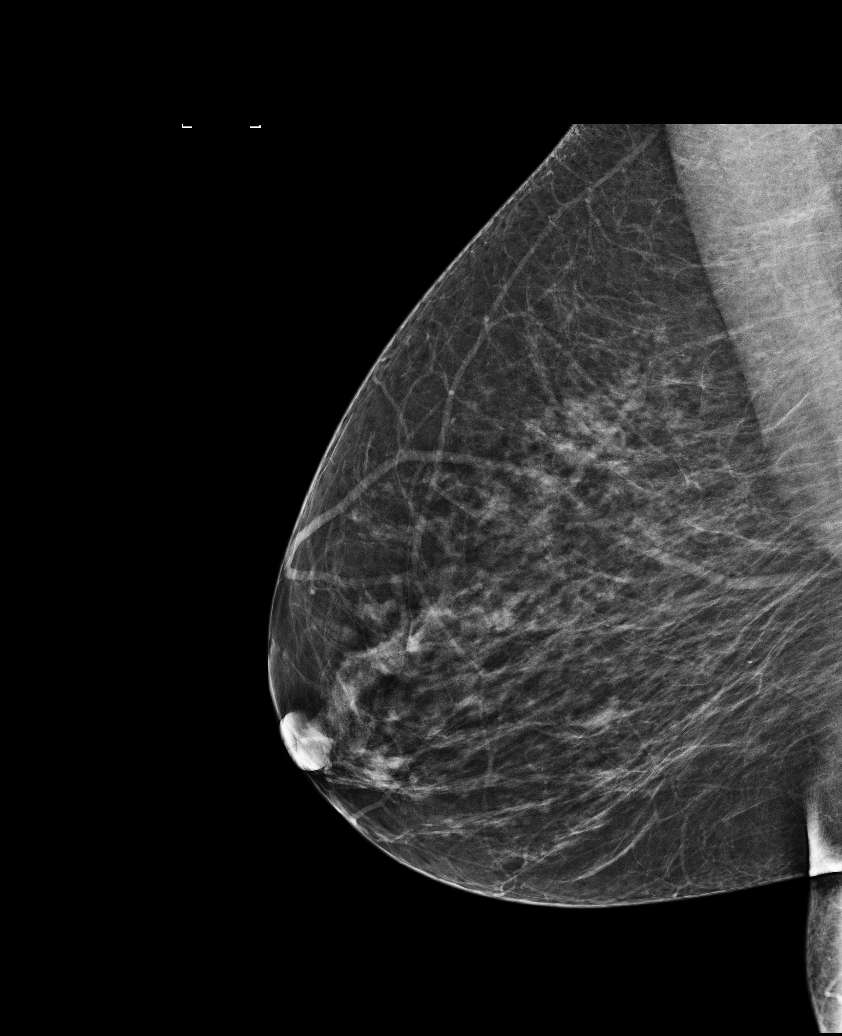

[L CC]
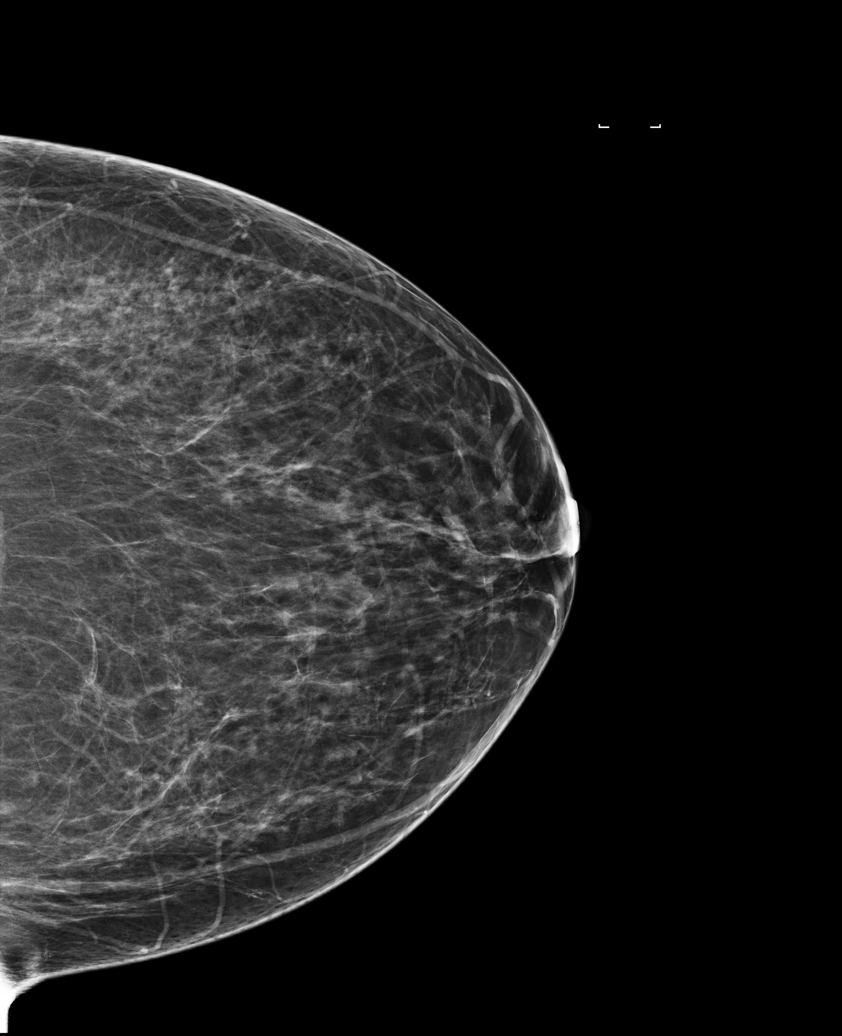

[5 of 5 positions shown; findings below may reference images not displayed]

ACR Breast Density Category b: There are scattered areas of
fibroglandular density.
FINDINGS: There are no findings suspicious for malignancy. Images were
processed with CAD.
IMPRESSION: No mammographic evidence of malignancy. A result letter of this
screening mammogram will be mailed directly to the patient.

RECOMMENDATION:
Screening mammogram in one year. (Code:[US])

BI-RADS CATEGORY  1: Negative.

## 2014-06-03 ENCOUNTER — Encounter (HOSPITAL_COMMUNITY): Payer: Self-pay | Admitting: Emergency Medicine

## 2014-06-05 ENCOUNTER — Encounter: Payer: Self-pay | Admitting: Pulmonary Disease

## 2014-06-05 ENCOUNTER — Ambulatory Visit (INDEPENDENT_AMBULATORY_CARE_PROVIDER_SITE_OTHER): Payer: 59 | Admitting: Pulmonary Disease

## 2014-06-05 ENCOUNTER — Ambulatory Visit: Payer: 59 | Admitting: Pulmonary Disease

## 2014-06-05 ENCOUNTER — Ambulatory Visit (INDEPENDENT_AMBULATORY_CARE_PROVIDER_SITE_OTHER): Payer: 59 | Admitting: *Deleted

## 2014-06-05 VITALS — BP 108/70 | HR 81 | Ht 64.0 in | Wt 176.8 lb

## 2014-06-05 DIAGNOSIS — Z23 Encounter for immunization: Secondary | ICD-10-CM

## 2014-06-05 DIAGNOSIS — I2699 Other pulmonary embolism without acute cor pulmonale: Secondary | ICD-10-CM

## 2014-06-05 DIAGNOSIS — I272 Other secondary pulmonary hypertension: Secondary | ICD-10-CM

## 2014-06-05 DIAGNOSIS — R0600 Dyspnea, unspecified: Secondary | ICD-10-CM

## 2014-06-05 DIAGNOSIS — Z0000 Encounter for general adult medical examination without abnormal findings: Secondary | ICD-10-CM

## 2014-06-05 DIAGNOSIS — I2724 Chronic thromboembolic pulmonary hypertension: Secondary | ICD-10-CM

## 2014-06-05 DIAGNOSIS — R079 Chest pain, unspecified: Secondary | ICD-10-CM

## 2014-06-05 DIAGNOSIS — I2782 Chronic pulmonary embolism: Secondary | ICD-10-CM

## 2014-06-05 MED ORDER — OMEPRAZOLE 20 MG PO CPDR: 20.0000 mg | DELAYED_RELEASE_CAPSULE | Freq: Every day | ORAL | Status: DC

## 2014-06-05 NOTE — Progress Notes (Signed)
Chief Complaint  Patient presents with  . Follow-up    Pt states that she feels a lot of pressure in chest, increased cough and difficulty sleeping. Pt c/o sharp pain in center chest, radiating below left breast around to middle of back.     History of Present Illness: [name] is a 43 y.o. female with dyspnea.  She has hx of recurrent PE with CTEPH.  She continues to have intermittent chest pain/pressure.  This located under her breasts around her bra line, and radiates to her back.  She is not having cough or wheeze.  She denies palpitations.  She does not think there is any relation to food.  She gets occasional reflux.  She was seen in ER on 04/18/14 >> Ct abd/pelvis showed stable renal cysts, IVC filter in place, but otherwise unremarkable.  BMET and U/A from ER visit were also unrevealing.  ECG today showed sinus rhythm.   TESTS: CXR 04/25/12 >> no acute abnormalities PFT 11/14/12 >> FEV1 2.52 (95%), FEV1% 92, TLC 3.75 (70%), DLCO 76%, no BD CT chest 12/14/12 >> 3 mm RUL nodule, scar LUL posterior segment  PMHx, PSHx, Medications, Allergies, Fhx, Shx reviewed  Physical Exam:  General - No distress ENT - No sinus tenderness, no oral exudate, no LAN Cardiac - s1s2 regular, no murmur Chest - No wheeze/rales/dullness Back - No focal tenderness Abd - Soft, non-tender Ext - No edema Neuro - Normal strength Skin - No rashes Psych - normal mood, and behavior   Assessment/Plan:  Chesley Mires, MD [place] [facility]/Critical [facility]/Sleep Pager:  [phone]

## 2014-06-05 NOTE — Patient Instructions (Signed)
Omeprazole 20 mg daily >> take 30 minutes before 1st meal of the day Will schedule chest xray with V/Q scan Will schedule Echo Will arrange for referral to primary care Follow up in 2 months

## 2014-06-06 ENCOUNTER — Ambulatory Visit (HOSPITAL_COMMUNITY)
Admission: RE | Admit: 2014-06-06 | Discharge: 2014-06-06 | Disposition: A | Discharge status: Home / Self Care | Payer: 59 | Source: Ambulatory Visit | Attending: Pulmonary Disease | Admitting: Pulmonary Disease

## 2014-06-06 ENCOUNTER — Encounter (HOSPITAL_COMMUNITY)
Admission: RE | Admit: 2014-06-06 | Discharge: 2014-06-06 | Disposition: A | Discharge status: Home / Self Care | Payer: 59 | Source: Ambulatory Visit | Attending: Pulmonary Disease | Admitting: Pulmonary Disease

## 2014-06-06 DIAGNOSIS — R0600 Dyspnea, unspecified: Secondary | ICD-10-CM | POA: Diagnosis present

## 2014-06-06 DIAGNOSIS — I2782 Chronic pulmonary embolism: Secondary | ICD-10-CM

## 2014-06-06 DIAGNOSIS — R0789 Other chest pain: Secondary | ICD-10-CM | POA: Insufficient documentation

## 2014-06-06 DIAGNOSIS — Z86711 Personal history of pulmonary embolism: Secondary | ICD-10-CM | POA: Diagnosis present

## 2014-06-06 IMAGING — CR DG CHEST 2V
2 series · 2 of 2 positions shown · non-contrast
Comparison: [DATE]

CLINICAL DATA: Dyspnea and midchest pain radiating down the left
side. History of pulmonary embolism.

EXAM:
CHEST  2 VIEW

[w chest pa]
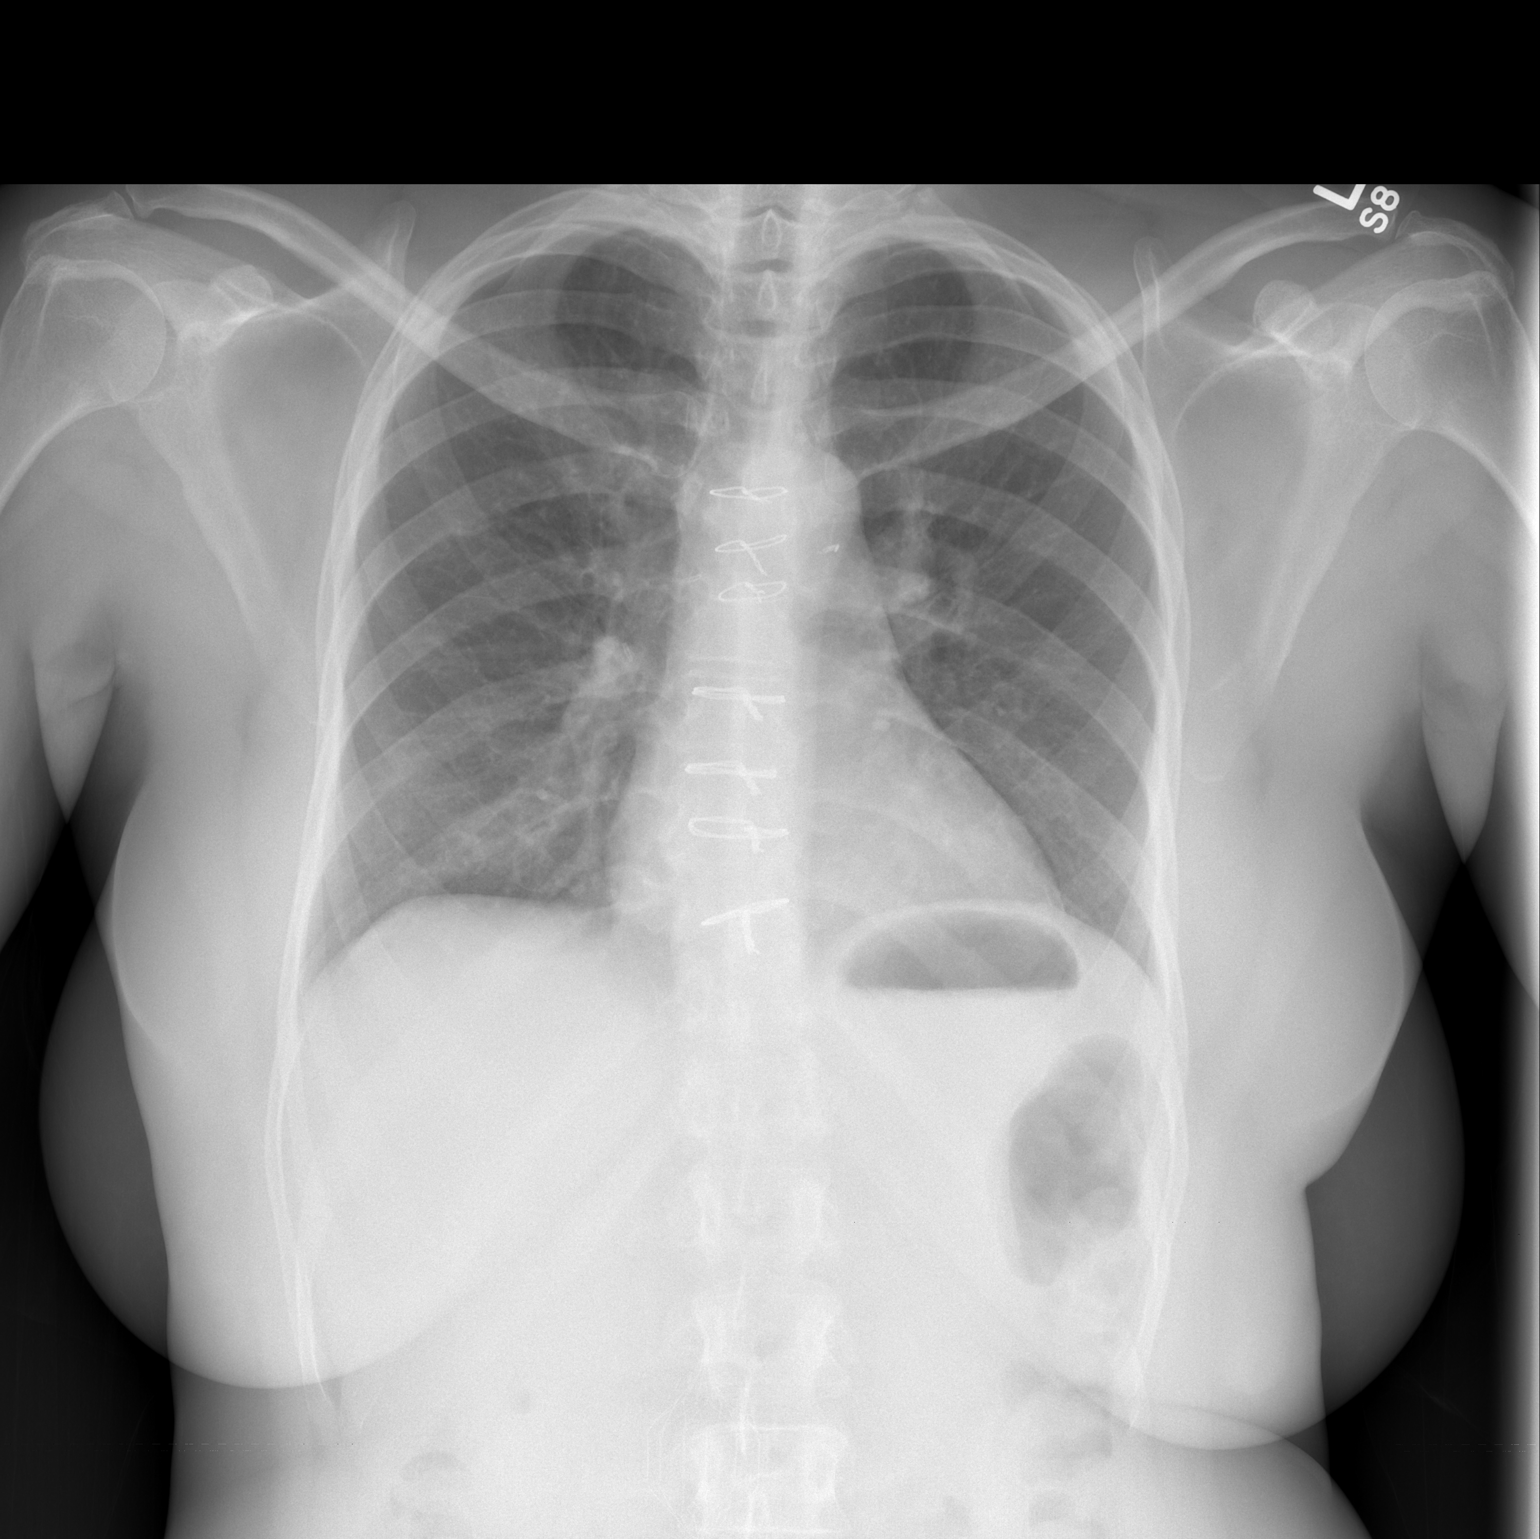

[w chest lat]
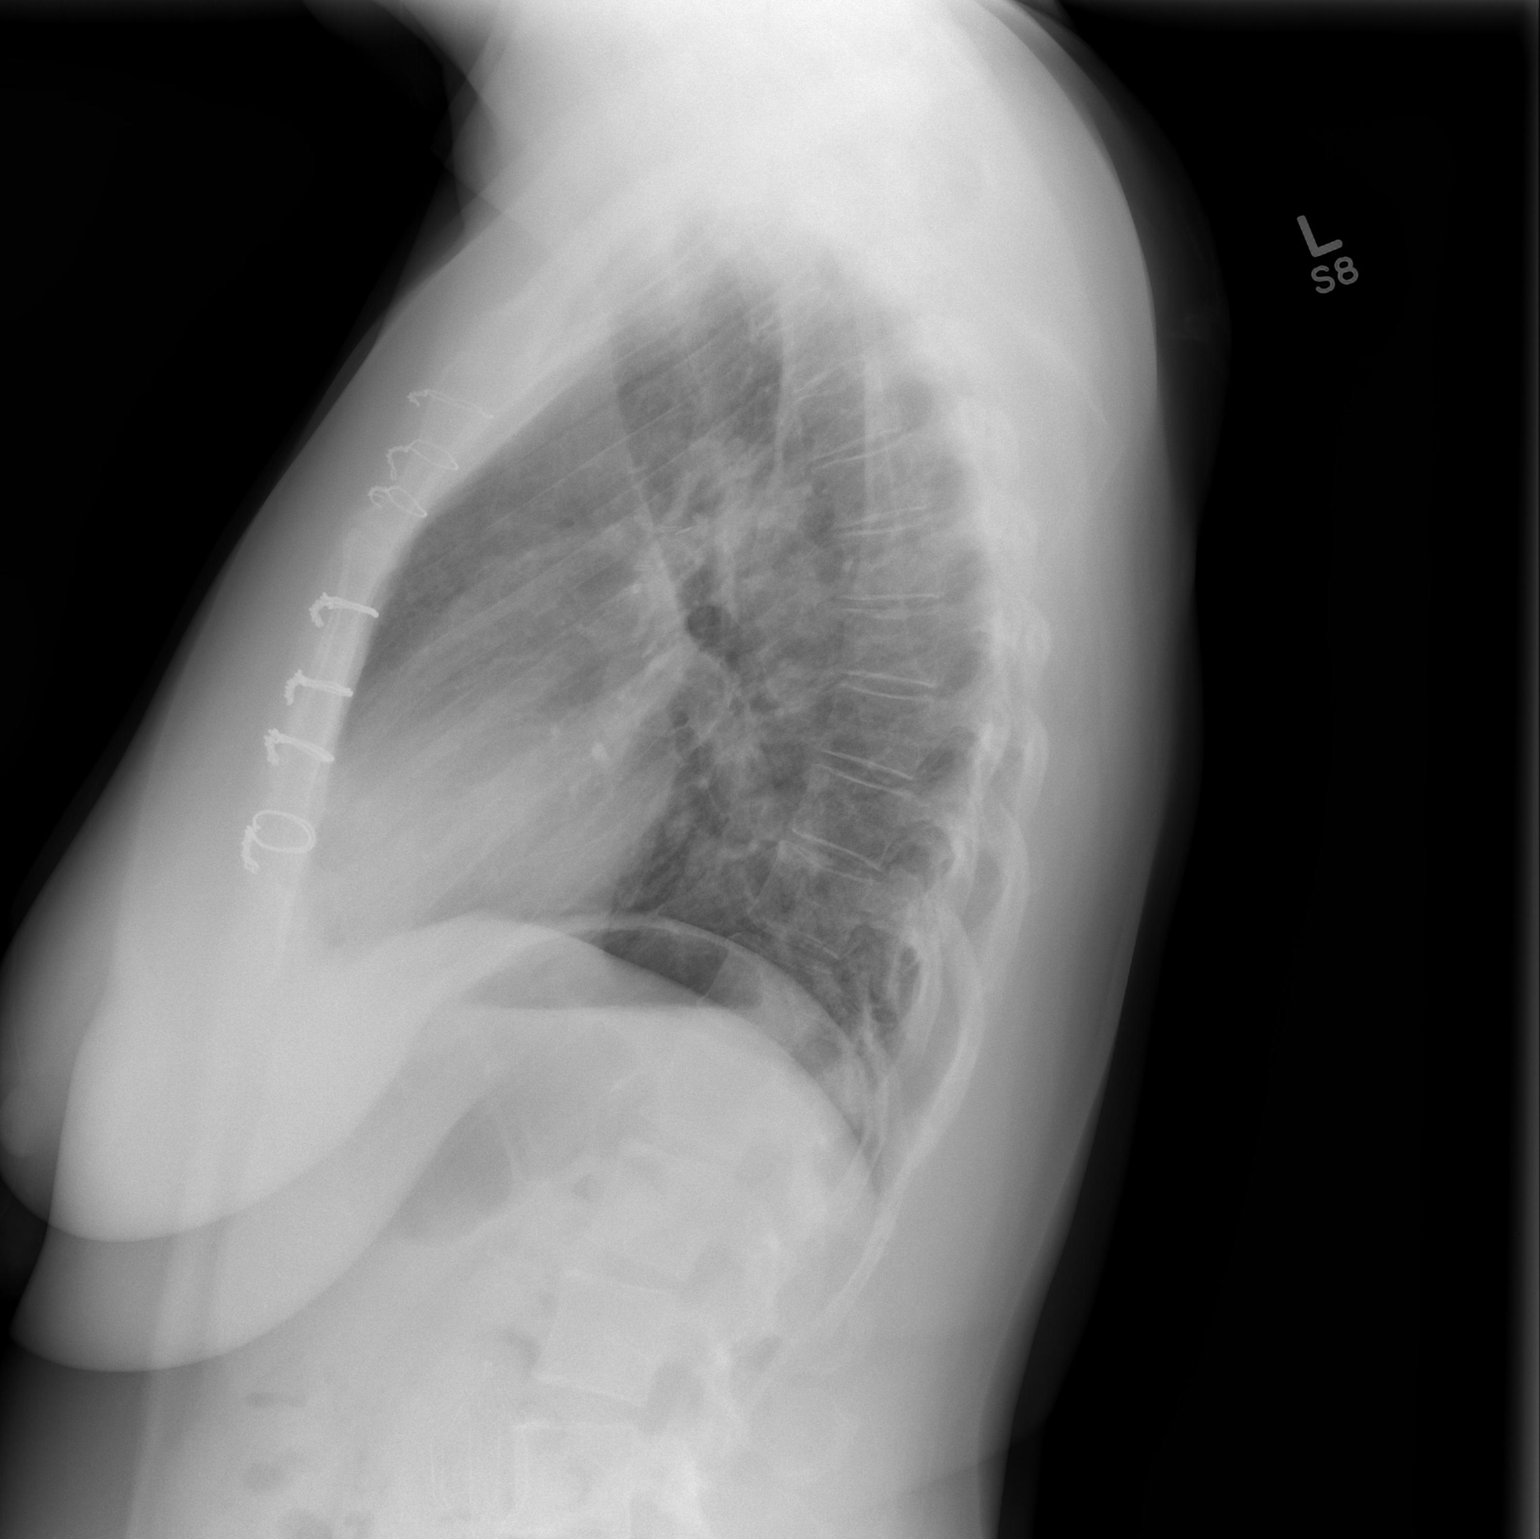

[2 of 2 positions shown; findings below may reference images not displayed]

FINDINGS: Sequelae of prior median sternotomy and PDA ligation are again
identified. Cardiomediastinal silhouette is within normal limits.
The patient has taken a greater inspiration than on the prior study
although the lungs are still slightly hypoinflated. There is mild
opacity in the right lung base, slightly more prominent than on
[DATE]. Left lung is clear. No pleural effusion or pneumothorax
is identified. No acute osseous abnormality is seen.
IMPRESSION: Mild opacity in the right lung base, slightly more prominent than in
[3I] and may reflect scarring or atelectasis although superimposed
infection is not excluded. Left lung clear.

## 2014-06-06 MED ORDER — TECHNETIUM TO 99M ALBUMIN AGGREGATED
5.0000 | Freq: Once | INTRAVENOUS | Status: AC | PRN
  Administered 2014-06-06: 5 via INTRAVENOUS

## 2014-06-06 MED ORDER — TECHNETIUM TC 99M DIETHYLENETRIAME-PENTAACETIC ACID: 50.0000 | Freq: Once | INTRAVENOUS | Status: AC | PRN

## 2014-06-07 ENCOUNTER — Ambulatory Visit (HOSPITAL_COMMUNITY): Payer: 59

## 2014-06-10 ENCOUNTER — Encounter: Payer: Self-pay | Admitting: Pulmonary Disease

## 2014-06-10 ENCOUNTER — Telehealth: Payer: Self-pay | Admitting: Pulmonary Disease

## 2014-06-10 NOTE — Assessment & Plan Note (Addendum)
She has hx of massive PE in 2005, s/p thrombectomy in 2007 at [facility].  She is to continue with xarelto.   She was previously followed at [facility], but prefers to only have follow up with [facility] now.

## 2014-06-10 NOTE — Assessment & Plan Note (Signed)
It is not clear what the cause of this is.  Will get V/Q scan with CXR and Echo to further assess whether there could be any relation to her hx of CTEPH.  She might also need further cardiac evaluation, but my suspicion for coronary artery disease as cause of her symptoms is low.  This could be related to reflux.  Advised her to try OTC PPI for several weeks.

## 2014-06-10 NOTE — Telephone Encounter (Signed)
Dg Chest 2 View  06/06/2014   CLINICAL DATA:  Dyspnea and midchest pain radiating down the left side. History of pulmonary embolism.  EXAM: CHEST  2 VIEW  COMPARISON:  02/25/2013  FINDINGS: Sequelae of prior median sternotomy and PDA ligation are again identified. Cardiomediastinal silhouette is within normal limits. The patient has taken a greater inspiration than on the prior study although the lungs are still slightly hypoinflated. There is mild opacity in the right lung base, slightly more prominent than on 05/23/2012. Left lung is clear. No pleural effusion or pneumothorax is identified. No acute osseous abnormality is seen.  IMPRESSION: Mild opacity in the right lung base, slightly more prominent than in 2013 and may reflect scarring or atelectasis although superimposed infection is not excluded. Left lung clear.   Electronically Signed   By: Logan Bores   On: 06/06/2014 09:08   N[facility] Pulmonary Per & Vent  06/06/2014   CLINICAL DATA:  Shortness of breath, chest pain, history of PEG in 2005  EXAM: [place]: Ventilation images were obtained in multiple projections using inhaled aerosol technetium 99 M DTPA. Perfusion images were obtained in multiple projections after intravenous injection of Tc-62mMAA.  RADIOPHARMACEUTICALS:  50.0 mCi Tc-932mTPA aerosol and 5.0 mCi Tc-9950mA  COMPARISON:  CTA chest dated 12/14/2012. Correlate joint chest radiograph dated 06/06/2014.  FINDINGS: Ventilation: Mildly heterogeneous ventilation without focal defect.  Perfusion: No wedge shaped peripheral perfusion defects to suggest acute pulmonary embolism.  Corresponding chest radiograph is essentially clear.  IMPRESSION: Very low probability for pulmonary embolism.   Electronically Signed   By: SriJulian HyD.   On: 06/06/2014 09:14     Will have my nurse inform pt that her chest imaging studies did not show recurrent of pulmonary embolism.  Nothing seen that  would explain her chest pain.  Will call her back after review of her Echo.

## 2014-06-10 NOTE — Assessment & Plan Note (Signed)
She needs new PCP.  Will arrange for referral to get new PCP set up for health maintenance.

## 2014-06-11 ENCOUNTER — Ambulatory Visit (HOSPITAL_COMMUNITY)
Admission: RE | Admit: 2014-06-11 | Discharge: 2014-06-11 | Disposition: A | Discharge status: Home / Self Care | Payer: 59 | Source: Ambulatory Visit | Attending: Pulmonary Disease | Admitting: Pulmonary Disease

## 2014-06-11 DIAGNOSIS — R0600 Dyspnea, unspecified: Secondary | ICD-10-CM

## 2014-06-11 DIAGNOSIS — I2699 Other pulmonary embolism without acute cor pulmonale: Secondary | ICD-10-CM | POA: Insufficient documentation

## 2014-06-11 DIAGNOSIS — I2782 Chronic pulmonary embolism: Secondary | ICD-10-CM

## 2014-06-11 DIAGNOSIS — I379 Nonrheumatic pulmonary valve disorder, unspecified: Secondary | ICD-10-CM

## 2014-06-11 NOTE — Progress Notes (Signed)
2D Echo Performed 06/11/2014    Marygrace Drought, RCS

## 2014-06-11 NOTE — Telephone Encounter (Signed)
lmtcb x1 

## 2014-06-12 NOTE — Telephone Encounter (Signed)
Pt returning call again.[name] ° °

## 2014-06-12 NOTE — Telephone Encounter (Signed)
Pt returning call.Stanley A Dalton ° °

## 2014-06-14 NOTE — Telephone Encounter (Signed)
Results have been explained to patient, pt expressed understanding. Nothing further needed.  

## 2014-06-18 ENCOUNTER — Telehealth: Payer: Self-pay | Admitting: Pulmonary Disease

## 2014-06-18 NOTE — Telephone Encounter (Signed)
Echo 06/11/14 >> EF 55 to 60%, mild TR and PR  Will have my nurse inform pt that Echo result is normal.

## 2014-06-19 ENCOUNTER — Other Ambulatory Visit: Payer: 59

## 2014-06-19 ENCOUNTER — Ambulatory Visit (INDEPENDENT_AMBULATORY_CARE_PROVIDER_SITE_OTHER): Payer: 59 | Admitting: Internal Medicine

## 2014-06-19 ENCOUNTER — Encounter: Payer: Self-pay | Admitting: Internal Medicine

## 2014-06-19 VITALS — BP 110/80 | HR 83 | Temp 98.8°F | Resp 16 | Ht 64.0 in | Wt 174.0 lb

## 2014-06-19 DIAGNOSIS — N898 Other specified noninflammatory disorders of vagina: Secondary | ICD-10-CM

## 2014-06-19 DIAGNOSIS — J069 Acute upper respiratory infection, unspecified: Secondary | ICD-10-CM | POA: Insufficient documentation

## 2014-06-19 DIAGNOSIS — E5112 Wet beriberi: Secondary | ICD-10-CM

## 2014-06-19 DIAGNOSIS — R059 Cough, unspecified: Secondary | ICD-10-CM

## 2014-06-19 DIAGNOSIS — L298 Other pruritus: Secondary | ICD-10-CM

## 2014-06-19 DIAGNOSIS — R05 Cough: Secondary | ICD-10-CM

## 2014-06-19 LAB — WET PREP, GENITAL
BACTERIA: NONE SEEN — AB
Clue Cells Wet Prep HPF POC: NONE SEEN — AB
TRICH WET PREP: NONE SEEN — AB
YEAST WET PREP: NONE SEEN — AB

## 2014-06-19 MED ORDER — HYDROCODONE-HOMATROPINE 5-1.5 MG/5ML PO SYRP
5.0000 mL | ORAL_SOLUTION | Freq: Three times a day (TID) | ORAL | Status: DC | PRN
Start: 2014-06-19 — End: 2015-01-16

## 2014-06-19 MED ORDER — CLINDAMYCIN PHOSPHATE 100 MG VA SUPP
100.0000 mg | Freq: Every day | VAGINAL | Status: DC
Start: 2014-06-19 — End: 2014-07-08

## 2014-06-19 NOTE — Progress Notes (Signed)
Pre visit review using our clinic review tool, if applicable. No additional management support is needed unless otherwise documented below in the visit note. 

## 2014-06-19 NOTE — Assessment & Plan Note (Signed)
Her prior CXR showed a mass so I will recheck her CXR today to see if there is mass, PNA, etc.

## 2014-06-19 NOTE — Assessment & Plan Note (Signed)
This is a viral infection so antibiotics were nor given Will control the cough with hycodan

## 2014-06-19 NOTE — Assessment & Plan Note (Signed)
Exam and wet prep are normal PAP smear is pending Will treat for BV

## 2014-06-19 NOTE — Patient Instructions (Signed)
Cough, Adult  A cough is a reflex that helps clear your throat and airways. It can help heal the body or may be a reaction to an irritated airway. A cough may only last 2 or 3 weeks (acute) or may last more than 8 weeks (chronic).  CAUSES Acute cough:  Viral or bacterial infections. Chronic cough:  Infections.  Allergies.  Asthma.  Post-nasal drip.  Smoking.  Heartburn or acid reflux.  Some medicines.  Chronic lung problems (COPD).  Cancer. SYMPTOMS   Cough.  Fever.  Chest pain.  Increased breathing rate.  High-pitched whistling sound when breathing (wheezing).  Colored mucus that you cough up (sputum). TREATMENT   A bacterial cough may be treated with antibiotic medicine.  A viral cough must run its course and will not respond to antibiotics.  Your caregiver may recommend other treatments if you have a chronic cough. HOME CARE INSTRUCTIONS   Only take over-the-counter or prescription medicines for pain, discomfort, or fever as directed by your caregiver. Use cough suppressants only as directed by your caregiver.  Use a cold steam vaporizer or humidifier in your bedroom or home to help loosen secretions.  Sleep in a semi-upright position if your cough is worse at night.  Rest as needed.  Stop smoking if you smoke. SEEK IMMEDIATE MEDICAL CARE IF:   You have pus in your sputum.  Your cough starts to worsen.  You cannot control your cough with suppressants and are losing sleep.  You begin coughing up blood.  You have difficulty breathing.  You develop pain which is getting worse or is uncontrolled with medicine.  You have a fever. MAKE SURE YOU:   Understand these instructions.  Will watch your condition.  Will get help right away if you are not doing well or get worse. Document Released: 01/15/2011 Document Revised: 10/11/2011 Document Reviewed: 01/15/2011 [employer] Patient Information 2015 [employer]. This information is not intended  to replace advice given to you by your health care provider. Make sure you discuss any questions you have with your health care provider.  

## 2014-06-19 NOTE — Telephone Encounter (Signed)
LMTCB x 1 

## 2014-06-19 NOTE — Progress Notes (Signed)
Subjective:    Patient ID: [name], female    DOB: January 25, 1971, 43 y.o.   MRN: [id number]  Cough This is a new problem. The current episode started in the past 7 days. The problem has been unchanged. The cough is non-productive. Associated symptoms include chills and a sore throat. Pertinent negatives include no chest pain, ear congestion, ear pain, fever, headaches, heartburn, hemoptysis, myalgias, nasal congestion, postnasal drip, rash, rhinorrhea, shortness of breath, sweats, weight loss or wheezing. She has tried nothing for the symptoms. The treatment provided no relief.      Review of Systems  Constitutional: Positive for chills. Negative for fever, weight loss, diaphoresis, activity change, appetite change and fatigue.  HENT: Positive for sore throat. Negative for ear pain, nosebleeds, postnasal drip, rhinorrhea, sinus pressure, trouble swallowing and voice change.   Eyes: Negative.   Respiratory: Positive for cough. Negative for apnea, hemoptysis, choking, chest tightness, shortness of breath and wheezing.   Cardiovascular: Negative.  Negative for chest pain, palpitations and leg swelling.  Gastrointestinal: Negative.  Negative for heartburn, nausea, vomiting, abdominal pain, diarrhea, constipation and blood in stool.  Endocrine: Negative.   Genitourinary: Negative.  Negative for dysuria, urgency, frequency, hematuria, flank pain, decreased urine volume, vaginal bleeding, vaginal discharge, enuresis, difficulty urinating, genital sores, vaginal pain, menstrual problem, pelvic pain and dyspareunia.       She complains of vaginal itching  Musculoskeletal: Negative.  Negative for myalgias and back pain.  Skin: Negative.  Negative for rash.  Allergic/Immunologic: Negative.   Neurological: Negative for headaches.  Hematological: Negative.  Negative for adenopathy. Does not bruise/bleed easily.  Psychiatric/Behavioral: Negative.        Objective:   Physical Exam    Constitutional: She is oriented to person, place, and time. She appears well-developed and well-nourished.  Non-toxic appearance. She does not have a sickly appearance. She does not appear ill. No distress.  HENT:  Head: Normocephalic and atraumatic.  Mouth/Throat: Oropharynx is clear and moist. No oropharyngeal exudate.  Eyes: Conjunctivae are normal. Right eye exhibits no discharge. Left eye exhibits no discharge. No scleral icterus.  Neck: Normal range of motion. Neck supple. No JVD present. No tracheal deviation present. No thyromegaly present.  Cardiovascular: Normal rate, regular rhythm, normal heart sounds and intact distal pulses.  Exam reveals no gallop and no friction rub.   No murmur heard. Pulmonary/Chest: Effort normal and breath sounds normal. No stridor. No respiratory distress. She has no wheezes. She has no rales. She exhibits no tenderness.  Abdominal: Soft. Bowel sounds are normal. She exhibits no distension and no mass. There is no tenderness. There is no rebound and no guarding. Hernia confirmed negative in the right inguinal area and confirmed negative in the left inguinal area.  Genitourinary: Uterus normal. No labial fusion. There is no rash, tenderness, lesion or injury on the right labia. There is no tenderness, lesion or injury on the left labia. Uterus is not deviated, not enlarged, not fixed and not tender. Cervix exhibits no motion tenderness, no discharge and no friability. Right adnexum displays no mass, no tenderness and no fullness. Left adnexum displays no tenderness and no fullness. No erythema, tenderness or bleeding in the vagina. No foreign body around the vagina. No signs of injury around the vagina. No vaginal discharge found.  Musculoskeletal: Normal range of motion. She exhibits no edema or tenderness.  Lymphadenopathy:    She has no cervical adenopathy.       Right: No inguinal adenopathy present.  Left: No inguinal adenopathy present.  Neurological:  She is oriented to person, place, and time.  Skin: Skin is warm and dry. No rash noted. She is not diaphoretic. No erythema. No pallor.  Psychiatric: She has a normal mood and affect. Her behavior is normal. Judgment and thought content normal.  Vitals reviewed.    Lab Results  Component Value Date   WBC 5.9 03/07/2014   HGB 13.0 03/07/2014   HCT 37.8 03/07/2014   PLT 271 03/07/2014   GLUCOSE 92 04/18/2014   CHOL 205* 05/24/2011   TRIG 108 05/24/2011   HDL 39* 05/24/2011   LDLCALC 144* 05/24/2011   ALT 14 03/07/2014   AST 22 03/07/2014   NA 139 04/18/2014   K 4.0 04/18/2014   CL 103 04/18/2014   CREATININE 0.96 04/18/2014   BUN 9 04/18/2014   CO2 23 04/18/2014   TSH 0.985 03/09/2012   INR 2.80 03/20/2012   HGBA1C 6.3 03/09/2012       Assessment & Plan:

## 2014-06-20 NOTE — Telephone Encounter (Signed)
Pt returned call & can be reached at [phone].  [name]

## 2014-06-25 NOTE — Telephone Encounter (Signed)
Results have been explained to patient, pt expressed understanding. Nothing further needed.  

## 2014-06-26 ENCOUNTER — Encounter: Payer: Self-pay | Admitting: Internal Medicine

## 2014-06-26 LAB — BACTERIAL VAGINOSIS WITH REFLEX
Candida species: NOT DETECTED
GARDNERELLA VAGINALIS: DETECTED — AB
Trichomonas vaginalis: NOT DETECTED

## 2014-07-08 ENCOUNTER — Telehealth: Payer: Self-pay | Admitting: Internal Medicine

## 2014-07-08 MED ORDER — METRONIDAZOLE 500 MG PO TABS: 500.0000 mg | ORAL_TABLET | Freq: Two times a day (BID) | ORAL | Status: DC

## 2014-07-08 NOTE — Telephone Encounter (Signed)
Notified pt with md response.../lmb 

## 2014-07-08 NOTE — Telephone Encounter (Signed)
Have sent in alternative metronidazole. She should take 1 pill twice a day for 7 days. Please advise that she should not drink alcohol while taking this medication.

## 2014-07-08 NOTE — Telephone Encounter (Signed)
MD is out of office. Pls advise on msg below...[name]

## 2014-07-08 NOTE — Telephone Encounter (Signed)
Pt called in and said that she needs to see if dr Ronnald Ramp can change the   clindamycin (CLEOCIN) 100 MG vaginal suppository [[id number]]   It was to expensive.  She wanted to see if there is anything cheaper

## 2014-08-26 ENCOUNTER — Encounter: Payer: Self-pay | Admitting: Pulmonary Disease

## 2014-08-26 ENCOUNTER — Ambulatory Visit (INDEPENDENT_AMBULATORY_CARE_PROVIDER_SITE_OTHER): Payer: BLUE CROSS/BLUE SHIELD | Admitting: Pulmonary Disease

## 2014-08-26 VITALS — BP 108/70 | HR 93 | Ht 64.0 in | Wt 170.0 lb

## 2014-08-26 DIAGNOSIS — I2724 Chronic thromboembolic pulmonary hypertension: Secondary | ICD-10-CM

## 2014-08-26 DIAGNOSIS — I272 Other secondary pulmonary hypertension: Secondary | ICD-10-CM

## 2014-08-26 DIAGNOSIS — R0600 Dyspnea, unspecified: Secondary | ICD-10-CM

## 2014-08-26 DIAGNOSIS — I2782 Chronic pulmonary embolism: Secondary | ICD-10-CM

## 2014-08-26 DIAGNOSIS — I2699 Other pulmonary embolism without acute cor pulmonale: Secondary | ICD-10-CM

## 2014-08-26 DIAGNOSIS — R079 Chest pain, unspecified: Secondary | ICD-10-CM

## 2014-08-26 MED ORDER — RIVAROXABAN 20 MG PO TABS: 20.0000 mg | ORAL_TABLET | Freq: Every day | ORAL | Status: DC

## 2014-08-26 MED ORDER — ALBUTEROL SULFATE HFA 108 (90 BASE) MCG/ACT IN AERS: 2.0000 | INHALATION_SPRAY | Freq: Every day | RESPIRATORY_TRACT | Status: DC | PRN

## 2014-08-26 NOTE — Progress Notes (Signed)
Chief Complaint  Patient presents with  . Follow-up    Pt reports no change since last seen. Pt states that she currently has a head cold. Denies cough/chest congestion.     History of Present Illness: [name] is a 44 y.o. female with dyspnea.  She has hx of recurrent PE/DVT with CTEPH s/p thrombectomy in 2007 at [facility].  She has been doing well until the last few days.  She developed a cold >> runny nose, and dry cough.  She denies fever, chest pain, abdominal pain, skin rash, or leg swelling.  She is slowly improving.  She gets heavy bleeding with her periods, but doesn't have problems in between.  TESTS: CXR 04/25/12 >> no acute abnormalities PFT 11/14/12 >> FEV1 2.52 (95%), FEV1% 92, TLC 3.75 (70%), DLCO 76%, no BD CT chest 12/14/12 >> 3 mm RUL nodule, scar LUL posterior segment V/Q scan 06/06/14 >> very low probability for PE Echo 06/11/14 >> EF 55 to 60%, mild TR and PR   PMHx >> s/p IVC, Hep B, Allergies  PSHx, Medications, Allergies, Fhx, Shx reviewed  Physical Exam: Blood pressure 108/70, pulse 93, height 5' 4"  (1.626 m), weight 170 lb (77.111 kg), SpO2 100 %. Body mass index is 29.17 kg/(m^2).  General - No distress ENT - No sinus tenderness, no oral exudate, no LAN Cardiac - s1s2 regular, no murmur Chest - No wheeze/rales/dullness Back - No focal tenderness Abd - Soft, non-tender Ext - No edema Neuro - Normal strength Skin - No rashes Psych - normal mood, and behavior   Assessment/Plan:  Recurrent PE/DVT life long anti-coagulation. Discussed indication for continue anti-coagulation therapy in relation to risk for recurrent lift threatening thrombo-embolic disease Plan: - continue xarelto  Hx of CTEPH. Recent Echo looked good. Plan: - she can stop spironolactone  Atypical chest pain >> V/Q scan and Echo from last visit were relatively unremarkable. Plan: - monitor clinically  URI >> likely viral Plan: - conservative management - refilled  albuterol  Heavy menstrual bleeding. Explained that she should not come of xarelto during her menses. Plan:  - advised her to f/u with women's health clinic   Chesley Mires, MD [place] Pager:  [phone]

## 2014-08-26 NOTE — Patient Instructions (Signed)
Stop spironolactone Follow up in 1 year

## 2015-01-12 ENCOUNTER — Encounter (HOSPITAL_COMMUNITY): Payer: Self-pay

## 2015-01-12 ENCOUNTER — Emergency Department (HOSPITAL_COMMUNITY)
Admission: EM | Admit: 2015-01-12 | Discharge: 2015-01-12 | Disposition: A | Discharge status: Home / Self Care | Payer: BLUE CROSS/BLUE SHIELD | Attending: Emergency Medicine | Admitting: Emergency Medicine

## 2015-01-12 DIAGNOSIS — Z8619 Personal history of other infectious and parasitic diseases: Secondary | ICD-10-CM | POA: Insufficient documentation

## 2015-01-12 DIAGNOSIS — Z86711 Personal history of pulmonary embolism: Secondary | ICD-10-CM | POA: Diagnosis not present

## 2015-01-12 DIAGNOSIS — Z8679 Personal history of other diseases of the circulatory system: Secondary | ICD-10-CM | POA: Diagnosis not present

## 2015-01-12 DIAGNOSIS — F439 Reaction to severe stress, unspecified: Secondary | ICD-10-CM | POA: Diagnosis not present

## 2015-01-12 DIAGNOSIS — Z9889 Other specified postprocedural states: Secondary | ICD-10-CM | POA: Insufficient documentation

## 2015-01-12 DIAGNOSIS — Z7901 Long term (current) use of anticoagulants: Secondary | ICD-10-CM | POA: Insufficient documentation

## 2015-01-12 DIAGNOSIS — Z79899 Other long term (current) drug therapy: Secondary | ICD-10-CM | POA: Insufficient documentation

## 2015-01-12 DIAGNOSIS — F410 Panic disorder [episodic paroxysmal anxiety] without agoraphobia: Secondary | ICD-10-CM | POA: Diagnosis not present

## 2015-01-12 DIAGNOSIS — Z86718 Personal history of other venous thrombosis and embolism: Secondary | ICD-10-CM | POA: Insufficient documentation

## 2015-01-12 DIAGNOSIS — Z862 Personal history of diseases of the blood and blood-forming organs and certain disorders involving the immune mechanism: Secondary | ICD-10-CM | POA: Insufficient documentation

## 2015-01-12 DIAGNOSIS — F430 Acute stress reaction: Secondary | ICD-10-CM

## 2015-01-12 DIAGNOSIS — Z7951 Long term (current) use of inhaled steroids: Secondary | ICD-10-CM | POA: Insufficient documentation

## 2015-01-12 DIAGNOSIS — R4182 Altered mental status, unspecified: Secondary | ICD-10-CM | POA: Diagnosis present

## 2015-01-12 DIAGNOSIS — F419 Anxiety disorder, unspecified: Secondary | ICD-10-CM | POA: Diagnosis present

## 2015-01-12 LAB — CBC
HCT: 38.2 % (ref 36.0–46.0)
Hemoglobin: 13.4 g/dL (ref 12.0–15.0)
MCH: 29.2 pg (ref 26.0–34.0)
MCHC: 35.1 g/dL (ref 30.0–36.0)
MCV: 83.2 fL (ref 78.0–100.0)
Platelets: 273 10*3/uL (ref 150–400)
RBC: 4.59 MIL/uL (ref 3.87–5.11)
RDW: 13.3 % (ref 11.5–15.5)
WBC: 5.8 10*3/uL (ref 4.0–10.5)

## 2015-01-12 LAB — BASIC METABOLIC PANEL
ANION GAP: 9 (ref 5–15)
BUN: 11 mg/dL (ref 6–20)
CALCIUM: 9 mg/dL (ref 8.9–10.3)
CHLORIDE: 106 mmol/L (ref 101–111)
CO2: 24 mmol/L (ref 22–32)
Creatinine, Ser: 1 mg/dL (ref 0.44–1.00)
GFR calc Af Amer: >60 mL/min (ref >60)
GFR calc non Af Amer: >60 mL/min (ref >60)
GLUCOSE: 103 mg/dL — AB (ref 65–99)
Potassium: 4.1 mmol/L (ref 3.5–5.1)
Sodium: 139 mmol/L (ref 135–145)

## 2015-01-12 LAB — I-STAT TROPONIN, ED: Troponin i, poc: 0 ng/mL (ref 0.00–0.08)

## 2015-01-12 MED ORDER — KETOROLAC TROMETHAMINE 30 MG/ML IJ SOLN
30.0000 mg | Freq: Once | INTRAMUSCULAR | Status: AC
  Administered 2015-01-12: 30 mg via INTRAVENOUS
  Filled 2015-01-12: qty 1

## 2015-01-12 MED ORDER — LORAZEPAM 1 MG PO TABS: 1.0000 mg | ORAL_TABLET | Freq: Three times a day (TID) | ORAL | Status: DC | PRN

## 2015-01-12 MED ORDER — LORAZEPAM 2 MG/ML IJ SOLN
0.5000 mg | Freq: Once | INTRAMUSCULAR | Status: AC
  Administered 2015-01-12: 0.5 mg via INTRAVENOUS
  Filled 2015-01-12: qty 1

## 2015-01-12 MED ORDER — LORAZEPAM 2 MG/ML IJ SOLN
1.0000 mg | Freq: Once | INTRAMUSCULAR | Status: AC
  Administered 2015-01-12: 1 mg via INTRAVENOUS
  Filled 2015-01-12: qty 1

## 2015-01-12 MED ORDER — LORAZEPAM 1 MG PO TABS: 1.0000 mg | ORAL_TABLET | ORAL | Status: DC | PRN

## 2015-01-12 NOTE — ED Notes (Signed)
Pt assisted to vehicle with this RN and Tramaine NT, pt left with family.

## 2015-01-12 NOTE — ED Notes (Signed)
Patient started to calm down. Patient's breathing decreased. Patient remains on the cardiac monitor. Patient comfortable. Encouraged to sleep.

## 2015-01-12 NOTE — ED Notes (Signed)
Family at the bedside.

## 2015-01-12 NOTE — ED Notes (Signed)
Pt is repeatedly saying "no, no, no, no" pt is tearful and is shaking her head back and forth.

## 2015-01-12 NOTE — Discharge Instructions (Signed)
Ativan as prescribed for anxiety. Please follow up with your doctor for recheck and referral to bereavement therapist/councelor.    Panic Attacks Panic attacks are sudden, short-livedsurges of severe anxiety, fear, or discomfort. They may occur for no reason when you are relaxed, when you are anxious, or when you are sleeping. Panic attacks may occur for a number of reasons:   Healthy people occasionally have panic attacks in extreme, life-threatening situations, such as war or natural disasters. Normal anxiety is a protective mechanism of the body that helps [place] react to danger (fight or flight response).  Panic attacks are often seen with anxiety disorders, such as panic disorder, social anxiety disorder, generalized anxiety disorder, and phobias. Anxiety disorders cause excessive or uncontrollable anxiety. They may interfere with your relationships or other life activities.  Panic attacks are sometimes seen with other mental illnesses, such as depression and posttraumatic stress disorder.  Certain medical conditions, prescription medicines, and drugs of abuse can cause panic attacks. SYMPTOMS  Panic attacks start suddenly, peak within 20 minutes, and are accompanied by four or more of the following symptoms:  Pounding heart or fast heart rate (palpitations).  Sweating.  Trembling or shaking.  Shortness of breath or feeling smothered.  Feeling choked.  Chest pain or discomfort.  Nausea or strange feeling in your stomach.  Dizziness, light-headedness, or feeling like you will faint.  Chills or hot flushes.  Numbness or tingling in your lips or hands and feet.  Feeling that things are not real or feeling that you are not yourself.  Fear of losing control or going crazy.  Fear of dying. Some of these symptoms can mimic serious medical conditions. For example, you may think you are having a heart attack. Although panic attacks can be very scary, they are not life  threatening. DIAGNOSIS  Panic attacks are diagnosed through an assessment by your health care provider. Your health care provider will ask questions about your symptoms, such as where and when they occurred. Your health care provider will also ask about your medical history and use of alcohol and drugs, including prescription medicines. Your health care provider may order blood tests or other studies to rule out a serious medical condition. Your health care provider may refer you to a mental health professional for further evaluation. TREATMENT   Most healthy people who have one or two panic attacks in an extreme, life-threatening situation will not require treatment.  The treatment for panic attacks associated with anxiety disorders or other mental illness typically involves counseling with a mental health professional, medicine, or a combination of both. Your health care provider will help determine what treatment is best for you.  Panic attacks due to physical illness usually go away with treatment of the illness. If prescription medicine is causing panic attacks, talk with your health care provider about stopping the medicine, decreasing the dose, or substituting another medicine.  Panic attacks due to alcohol or drug abuse go away with abstinence. Some adults need professional help in order to stop drinking or using drugs. HOME CARE INSTRUCTIONS   Take all medicines as directed by your health care provider.   Schedule and attend follow-up visits as directed by your health care provider. It is important to keep all your appointments. SEEK MEDICAL CARE IF:  You are not able to take your medicines as prescribed.  Your symptoms do not improve or get worse. SEEK IMMEDIATE MEDICAL CARE IF:   You experience panic attack symptoms that are different than your  usual symptoms.  You have serious thoughts about hurting yourself or others.  You are taking medicine for panic attacks and have a  serious side effect. MAKE SURE YOU:  Understand these instructions.  Will watch your condition.  Will get help right away if you are not doing well or get worse. Document Released: 07/19/2005 Document Revised: 07/24/2013 Document Reviewed: 03/02/2013 [facility] Patient Information 2015 [place], [place]. This information is not intended to replace advice given to you by your health care provider. Make sure you discuss any questions you have with your health care provider.

## 2015-01-12 NOTE — ED Notes (Signed)
Police at the bedside.

## 2015-01-12 NOTE — ED Provider Notes (Signed)
CSN: [id number]     Arrival date & time 01/12/15  1952 History   First MD Initiated Contact with Patient 01/12/15 2026     Chief Complaint  Patient presents with  . Altered Mental Status     (Consider location/radiation/quality/duration/timing/severity/associated sxs/prior Treatment) HPI Sulay Brymer is a 44 y.o. female with history of PE, hepatitis B, pulmonary hypertension, on Xarelto, presents to emergency department complaining of altered mental status. Patient was seen early in emergency department today after she found out that her nephew has passed away at age 57 after drowning in the pool. Patient was treated in emergency department Ativan and discharged with prescription for the same. Her significant other states that she did not fill the prescription, but has been at home crying the whole time, and now stopped answering or speaking to anybody, repeating the same words, not responding when being spoken to. Family unable to console her at home so she was brought to emergency department.  Past Medical History  Diagnosis Date  . Pulmonary embolism 2005       . CTEPH (chronic thromboembolic pulmonary hypertension)   . Primary hypercoagulable state   . Hepatitis B infection     Hep B core Ab 04/2005  . DVT (deep venous thrombosis)   . Seasonal allergies    Past Surgical History  Procedure Laterality Date  . Pulmonary artery endarterectomy  2007  . Cardiac catheterization  11/2005    R heart cath : Severe pulmonary arterial HTN with evidence of cor pulmonale. Vasodilatory challenge not performed.  . Vena cava filter placement     Family History  Problem Relation Age of Onset  . Hypertension Mother   . Hypertension Father     deceased  . Alcohol abuse Neg Hx   . Cancer Neg Hx   . Diabetes Neg Hx   . Early death Neg Hx   . Heart disease Neg Hx   . Hyperlipidemia Neg Hx   . Kidney disease Neg Hx   . Stroke Neg Hx    History  Substance Use Topics  . Smoking status:  Never Smoker   . Smokeless tobacco: Never Used  . Alcohol Use: No   OB History    Gravida Para Term Preterm AB TAB SAB Ectopic Multiple Living   4 3 3  1  1   3      Review of Systems  Unable to perform ROS: Mental status change      Allergies  Aspirin  Home Medications   Prior to Admission medications   Medication Sig Start Date End Date Taking? Authorizing Provider  acetaminophen (TYLENOL) 325 MG tablet Take 325 mg by mouth daily as needed for moderate pain. For pain    Historical Provider, MD  albuterol (PROVENTIL HFA;VENTOLIN HFA) 108 (90 BASE) MCG/ACT inhaler Inhale 2 puffs into the lungs daily as needed for shortness of breath. For shortness of breath or wheezing 08/26/14   Chesley Mires, MD  fluticasone (FLONASE) 50 MCG/ACT nasal spray Place 2 sprays into the nose 2 (two) times a week. 05/16/13 07/23/15  Chesley Mires, MD  HYDROcodone-homatropine (HYCODAN) 5-1.5 MG/5ML syrup Take 5 mLs by mouth every 8 (eight) hours as needed for cough. 06/19/14   Janith Lima, MD  LORazepam (ATIVAN) 1 MG tablet Take 1 tablet (1 mg total) by mouth 3 (three) times daily as needed for anxiety. 01/12/15   Blanchie Dessert, MD  Multiple Vitamin (MULTIVITAMIN WITH MINERALS) TABS Take 1 tablet by mouth daily. Centrum  Historical Provider, MD  rivaroxaban (XARELTO) 20 MG TABS tablet Take 1 tablet (20 mg total) by mouth daily. TAKE 1 TABLET (20 MG TOTAL) BY MOUTH EVERY MORNING. 08/26/14   Chesley Mires, MD   BP 125/86 mmHg  Pulse 79  Temp(Src) 97.9 F (36.6 C) (Axillary)  Resp 22  SpO2 100% Physical Exam  Constitutional: She appears well-developed and well-nourished.  Patient is playing with her eyes closed, tearful, hyperventilating, moaning  HENT:  Head: Normocephalic.  Eyes: Conjunctivae are normal.  Neck: Normal range of motion. Neck supple.  Cardiovascular: Normal rate, regular rhythm and normal heart sounds.   Pulmonary/Chest: Breath sounds normal. She has no wheezes. She has no rales.   Tachypneic  Abdominal: Soft. Bowel sounds are normal. She exhibits no distension. There is no tenderness. There is no rebound.  Musculoskeletal: She exhibits no edema.  Neurological: She is alert.  Patient is able to open her eyes on the command, able to squeeze hands and move her extremities on command. Strength is intact and 5 out of 5 in both upper and lower extremities.  Skin: Skin is warm and dry.  Psychiatric: She has a normal mood and affect. Her behavior is normal.  Nursing note and vitals reviewed.   ED Course  Procedures (including critical care time) Labs Review Labs Reviewed  BASIC METABOLIC PANEL - Abnormal; Notable for the following:    Glucose, Bld 103 (*)    All other components within normal limits  CBC  I-STAT TROPOININ, ED    Imaging Review No results found.   EKG Interpretation None      MDM   Final diagnoses:  Panic attack    Patient with altered mental status versus anxiety and panic attack after losing her nephew at age 24 today. Patient is nonverbal at this time however at times repeats several words over and over and over again will get labs for workup for altered mental status, Ativan and Toradol ordered. Patient is able to point to her head when I asked her if she is having any pain.  10:36 PM Patient is feeling better. She is nonverbal, following all directions, no distress. Vital signs are normal. Labs including troponin are unremarkable. Most likely panic attack from loss of her nephew.plan to dc with ativan. Follow up with pcp.   Filed Vitals:   01/12/15 2115 01/12/15 2145 01/12/15 2200 01/12/15 2215  BP: 108/72 105/64 102/79 102/71  Pulse: 75 75 78 78  Temp:      TempSrc:      Resp: 16 17 18 16   SpO2: 100% 100% 100% 100%     Jeannett Senior, PA-C 01/12/15 2239  Pamella Pert, MD 01/13/15 1228

## 2015-01-12 NOTE — ED Notes (Signed)
Pt brother wants to be called about the pt when she is ready to leave  DJ: [place] Sister Jeralene Huff: [phone]

## 2015-01-12 NOTE — ED Notes (Signed)
Social Worker at the bedside.

## 2015-01-12 NOTE — Discharge Instructions (Signed)
Post-traumatic Stress You have post-traumatic stress disorder (PTSD). This condition causes many different symptoms including: emotional outbursts, anxiety, sleeping problems, social withdrawal, and drug abuse. PTSD often follows a particularly traumatic event such as war, or natural disasters like hurricanes, earthquakes, or floods. It can also be seen after personal traumas such as accidents, rape, or the death of someone you love. Symptoms may be delayed for days or even years. Emotional numbing and the inability to feel your emotions, may be the earliest sign. Periods of agitation, aggression, and inability to perform ordinary tasks are common with PTSD. Nightmares and daytime memories of the trauma often bring on uncontrolled symptoms. Sufferers typically startle easily and avoid reminders of the trauma. Panic attacks, feelings of extreme guilt, and blackouts are often reported. Treatment is very helpful, especially group therapy. Healing happens when emotional traumas are shared with others who have a sympathetic ear. The [facility] [place] [facility] have helped over 185,000 veterans with this problem. Medication is also very effective. The symptoms can become chronic and lifelong, so it is important to get help. Call your caregiver or a counselor who deals with this type of problem for further assistance. Document Released: 08/26/2004 Document Revised: 10/11/2011 Document Reviewed: 07/19/2005 [facility] Patient Information 2015 [place], [place]. This information is not intended to replace advice given to you by your health care provider. Make sure you discuss any questions you have with your health care provider.

## 2015-01-12 NOTE — ED Notes (Signed)
Patient here from home via EMS with complaint of altered mental status. Per EMS patient became altered today after learning about the sudden drowning death of her nephew. Currently patient follows commands, but doesn't carry a conversation and responds with repetitive answers.

## 2015-01-12 NOTE — ED Provider Notes (Addendum)
CSN: [id number]     Arrival date & time 01/12/15  1353 History   First MD Initiated Contact with Patient 01/12/15 1402     Chief Complaint  Patient presents with  . Anxiety     (Consider location/radiation/quality/duration/timing/severity/associated sxs/prior Treatment) HPI Comments: Patient being brought in by the senior after her nephew was found in a pool. He apparently drowned at the age of 69 and was unable to be resuscitated.  Patient became crying hysterically and rolling around on the ground. She was hyperventilating and unable to control herself. EMS brought her here and states in route she calmed down a little bit, but upon arrival here she began to [facility] again when she was told that her nephew had passed away.  Patient is a 44 y.o. female presenting with anxiety. The history is provided by the patient and the EMS personnel.  Anxiety This is a new problem.    Past Medical History  Diagnosis Date  . Pulmonary embolism 2005       . CTEPH (chronic thromboembolic pulmonary hypertension)   . Primary hypercoagulable state   . Hepatitis B infection     Hep B core Ab 04/2005  . DVT (deep venous thrombosis)   . Seasonal allergies    Past Surgical History  Procedure Laterality Date  . Pulmonary artery endarterectomy  2007  . Cardiac catheterization  11/2005    R heart cath : Severe pulmonary arterial HTN with evidence of cor pulmonale. Vasodilatory challenge not performed.  . Vena cava filter placement     Family History  Problem Relation Age of Onset  . Hypertension Mother   . Hypertension Father     deceased  . Alcohol abuse Neg Hx   . Cancer Neg Hx   . Diabetes Neg Hx   . Early death Neg Hx   . Heart disease Neg Hx   . Hyperlipidemia Neg Hx   . Kidney disease Neg Hx   . Stroke Neg Hx    History  Substance Use Topics  . Smoking status: Never Smoker   . Smokeless tobacco: Never Used  . Alcohol Use: No   OB History    Gravida Para Term Preterm AB TAB SAB Ectopic  Multiple Living   4 3 3  1  1   3      Review of Systems  All other systems reviewed and are negative.     Allergies  Aspirin  Home Medications   Prior to Admission medications   Medication Sig Start Date End Date Taking? Authorizing Provider  acetaminophen (TYLENOL) 325 MG tablet Take 325 mg by mouth daily as needed for moderate pain. For pain    Historical Provider, MD  albuterol (PROVENTIL HFA;VENTOLIN HFA) 108 (90 BASE) MCG/ACT inhaler Inhale 2 puffs into the lungs daily as needed for shortness of breath. For shortness of breath or wheezing 08/26/14   Chesley Mires, MD  fluticasone (FLONASE) 50 MCG/ACT nasal spray Place 2 sprays into the nose 2 (two) times a week. 05/16/13 07/23/15  Chesley Mires, MD  HYDROcodone-homatropine (HYCODAN) 5-1.5 MG/5ML syrup Take 5 mLs by mouth every 8 (eight) hours as needed for cough. 06/19/14   Janith Lima, MD  Multiple Vitamin (MULTIVITAMIN WITH MINERALS) TABS Take 1 tablet by mouth daily. Centrum    Historical Provider, MD  rivaroxaban (XARELTO) 20 MG TABS tablet Take 1 tablet (20 mg total) by mouth daily. TAKE 1 TABLET (20 MG TOTAL) BY MOUTH EVERY MORNING. 08/26/14   Chesley Mires, MD  BP 127/107 mmHg  Pulse 88  Temp(Src) 97.4 F (36.3 C) (Axillary)  Resp 16  SpO2 96% Physical Exam  Constitutional: She appears well-developed and well-nourished. She appears distressed.  Crying, wailing, shaking in the bed. Hyperventilating.  HENT:  Head: Normocephalic and atraumatic.  Mouth/Throat: Oropharynx is clear and moist.  Eyes: Conjunctivae and EOM are normal. Pupils are equal, round, and reactive to light.  Neck: Normal range of motion. Neck supple.  Cardiovascular: Normal rate, regular rhythm and intact distal pulses.   No murmur heard. Pulmonary/Chest: Effort normal and breath sounds normal. No respiratory distress. She has no wheezes. She has no rales.  Abdominal: Soft. She exhibits no distension. There is no tenderness. There is no rebound and no  guarding.  Musculoskeletal: Normal range of motion. She exhibits no edema or tenderness.  Neurological: She is alert.  Patient is not answering any questions right now  Skin: Skin is warm and dry. No rash noted. No erythema.  Psychiatric: She has a normal mood and affect. Her behavior is normal.  Nursing note and vitals reviewed.   ED Course  Procedures (including critical care time) Labs Review Labs Reviewed - No data to display  Imaging Review No results found.   EKG Interpretation None      MDM   Final diagnoses:  Stress reaction    Pt presents here today after finding out that her 31 year old nephew drowned hyperventilating, crying and hysterical. After getting Ativan patient calm down a little bit and then stated she was having left-sided chest pain. Patient does take xarelto for PE.  Her EKG is within normal limits. Family members are at bedside with the patient. Her vital signs are all within normal limits. While here in while telling the patient that her nephew died she crawled onto the floor and continued to wale. She did not fall or hit her head.  4:18 PM Pt feeling better and ready to go home.  Sent home with some ativan.  Blanchie Dessert, MD 01/12/15 8718  Blanchie Dessert, MD 01/12/15 [place], MD 01/12/15 1622

## 2015-01-16 ENCOUNTER — Telehealth: Payer: Self-pay | Admitting: Internal Medicine

## 2015-01-16 ENCOUNTER — Encounter (HOSPITAL_COMMUNITY): Payer: Self-pay | Admitting: Emergency Medicine

## 2015-01-16 ENCOUNTER — Emergency Department (HOSPITAL_COMMUNITY)
Admission: EM | Admit: 2015-01-16 | Discharge: 2015-01-16 | Disposition: A | Discharge status: Home / Self Care | Payer: BLUE CROSS/BLUE SHIELD | Attending: Emergency Medicine | Admitting: Emergency Medicine

## 2015-01-16 ENCOUNTER — Emergency Department (HOSPITAL_COMMUNITY): Payer: BLUE CROSS/BLUE SHIELD

## 2015-01-16 DIAGNOSIS — Z8679 Personal history of other diseases of the circulatory system: Secondary | ICD-10-CM | POA: Diagnosis not present

## 2015-01-16 DIAGNOSIS — Z7901 Long term (current) use of anticoagulants: Secondary | ICD-10-CM | POA: Insufficient documentation

## 2015-01-16 DIAGNOSIS — Z8619 Personal history of other infectious and parasitic diseases: Secondary | ICD-10-CM | POA: Insufficient documentation

## 2015-01-16 DIAGNOSIS — Z9889 Other specified postprocedural states: Secondary | ICD-10-CM | POA: Diagnosis not present

## 2015-01-16 DIAGNOSIS — R079 Chest pain, unspecified: Secondary | ICD-10-CM | POA: Diagnosis present

## 2015-01-16 DIAGNOSIS — Z86718 Personal history of other venous thrombosis and embolism: Secondary | ICD-10-CM | POA: Diagnosis not present

## 2015-01-16 DIAGNOSIS — Z86711 Personal history of pulmonary embolism: Secondary | ICD-10-CM | POA: Insufficient documentation

## 2015-01-16 DIAGNOSIS — Z862 Personal history of diseases of the blood and blood-forming organs and certain disorders involving the immune mechanism: Secondary | ICD-10-CM | POA: Diagnosis not present

## 2015-01-16 LAB — BASIC METABOLIC PANEL
Anion gap: 7 (ref 5–15)
BUN: 9 mg/dL (ref 6–20)
CO2: 27 mmol/L (ref 22–32)
Calcium: 9.1 mg/dL (ref 8.9–10.3)
Chloride: 102 mmol/L (ref 101–111)
Creatinine, Ser: 0.96 mg/dL (ref 0.44–1.00)
GFR calc Af Amer: >60 mL/min (ref >60)
GFR calc non Af Amer: >60 mL/min (ref >60)
Glucose, Bld: 107 mg/dL — ABNORMAL HIGH (ref 65–99)
Potassium: 3.4 mmol/L — ABNORMAL LOW (ref 3.5–5.1)
Sodium: 136 mmol/L (ref 135–145)

## 2015-01-16 LAB — I-STAT TROPONIN, ED
Troponin i, poc: 0 ng/mL (ref 0.00–0.08)
Troponin i, poc: 0 ng/mL (ref 0.00–0.08)

## 2015-01-16 LAB — CBC
HCT: 37.6 % (ref 36.0–46.0)
Hemoglobin: 12.9 g/dL (ref 12.0–15.0)
MCH: 29.1 pg (ref 26.0–34.0)
MCHC: 34.3 g/dL (ref 30.0–36.0)
MCV: 84.7 fL (ref 78.0–100.0)
Platelets: 261 10*3/uL (ref 150–400)
RBC: 4.44 MIL/uL (ref 3.87–5.11)
RDW: 13.3 % (ref 11.5–15.5)
WBC: 4.6 10*3/uL (ref 4.0–10.5)

## 2015-01-16 IMAGING — DX DG CHEST 2V
2 series · 2 of 2 positions shown · non-contrast
Comparison: [DATE]

CLINICAL DATA: Chest pain

EXAM:
CHEST - 2 VIEW

[chest lat]
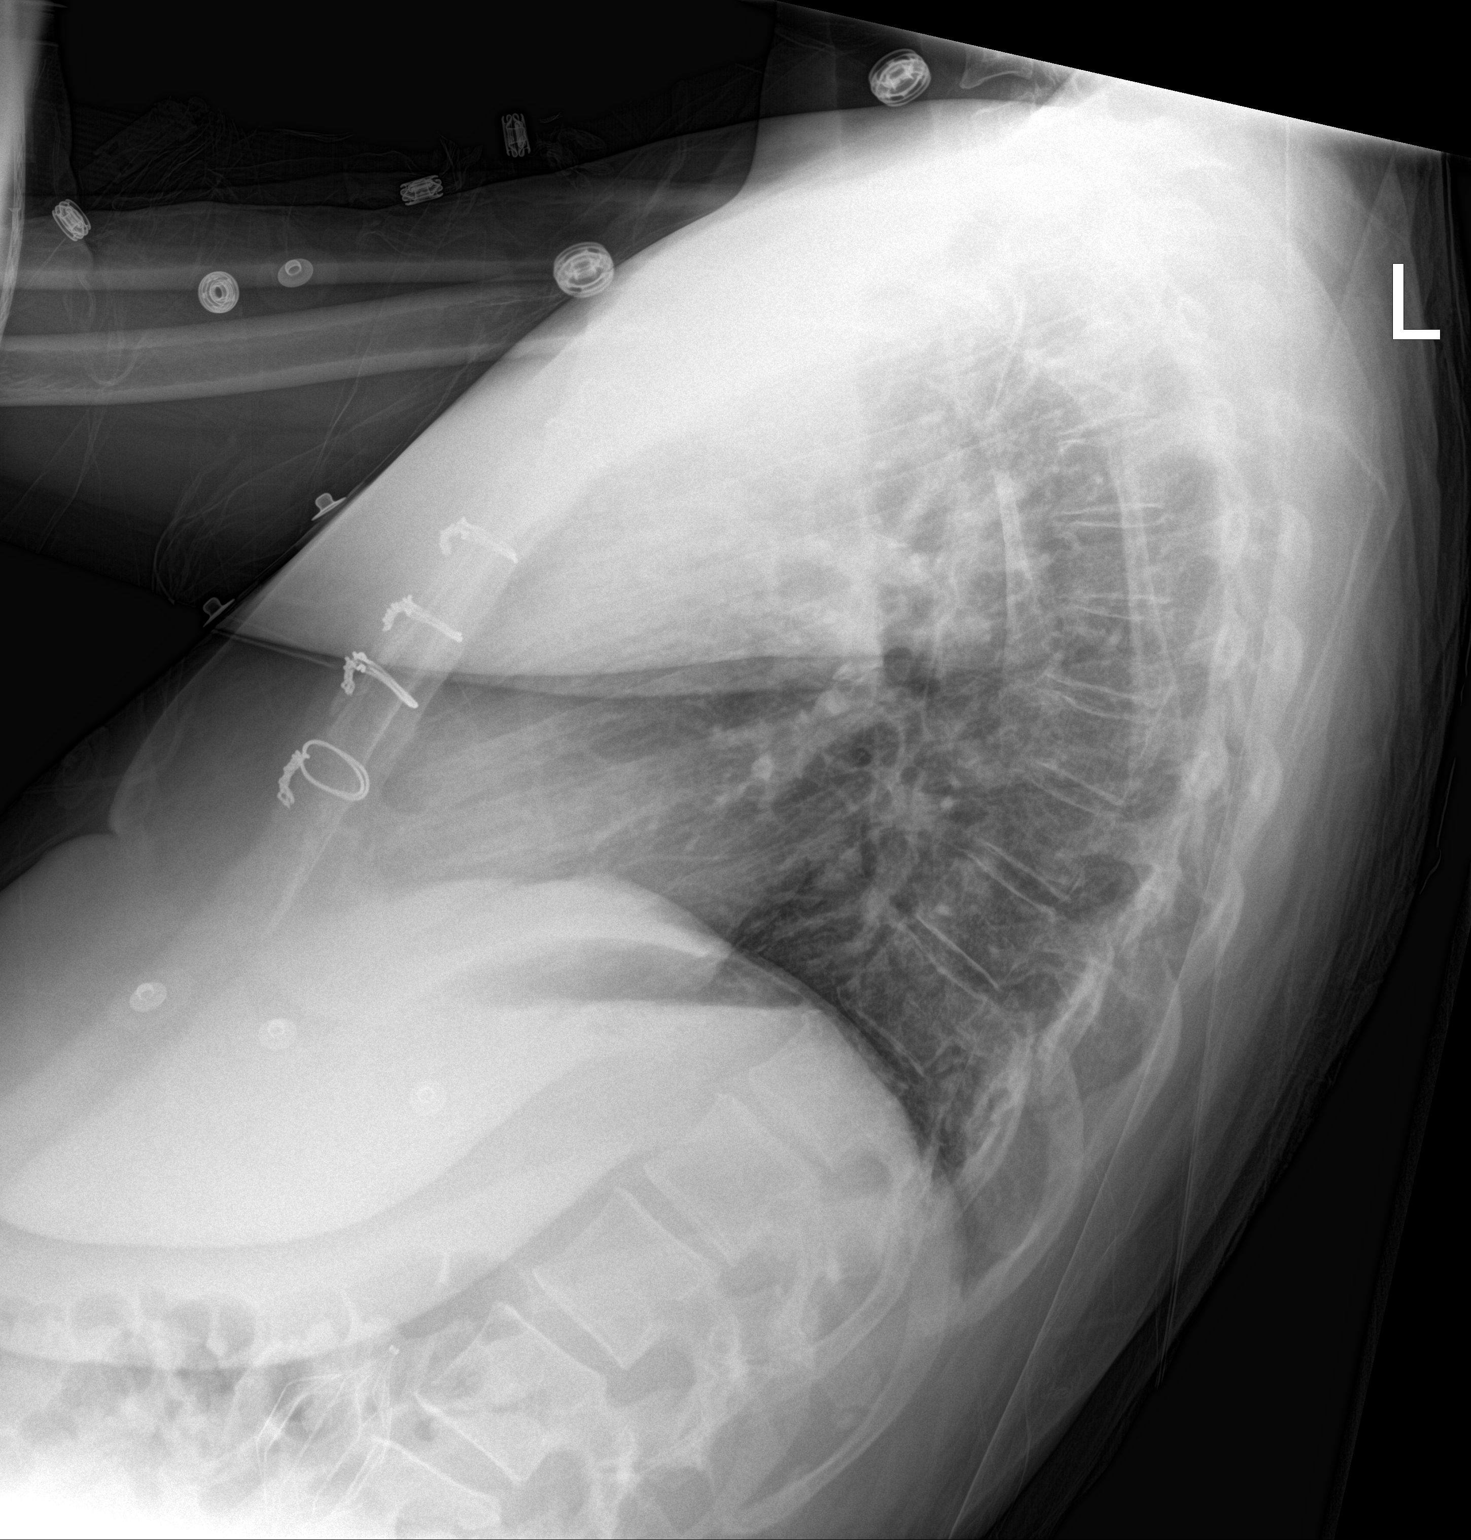

[chest ap]
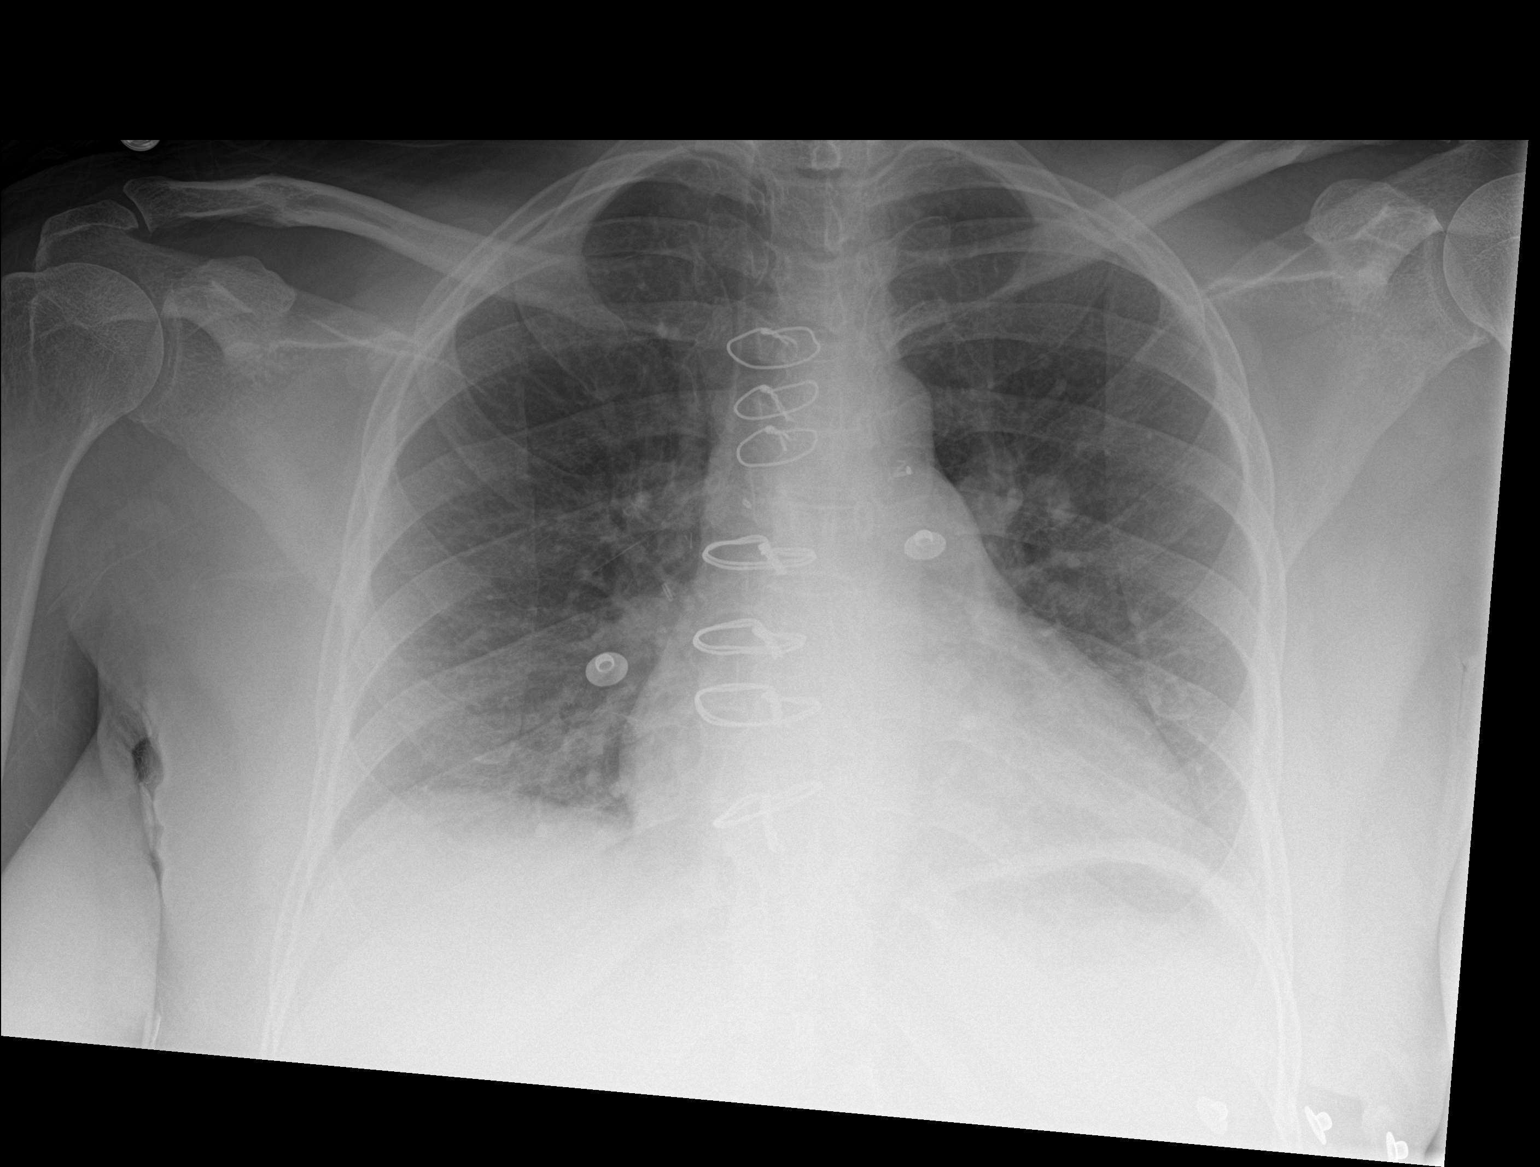

[2 of 2 positions shown; findings below may reference images not displayed]

FINDINGS: The heart size and mediastinal contours are within normal limits.
Both lungs are clear. The visualized skeletal structures are
unremarkable.
IMPRESSION: No active disease.

## 2015-01-16 IMAGING — CT CT ANGIO CHEST
3 of 9 series · 18 of 36 positions shown · IV contrast (Omni 300)
Comparison: Chest CT [DATE].

CLINICAL DATA: 44-year-old female with midsternal chest pain with
radiation back for the past or. Weakness and dizziness.

EXAM:
CT ANGIOGRAPHY CHEST WITH CONTRAST
TECHNIQUE: Multidetector CT imaging of the chest was performed using the
standard protocol during bolus administration of intravenous
contrast. Multiplanar CT image reconstructions and MIPs were
obtained to evaluate the vascular anatomy.
CONTRAST:  100mL OMNIPAQUE IOHEXOL 350 MG/ML SOLN

[Series 5: thins · axial · 0.62mm/px · z∈[+246,+473]mm · 15 of 261 slices shown]
[im 17/261  lung]
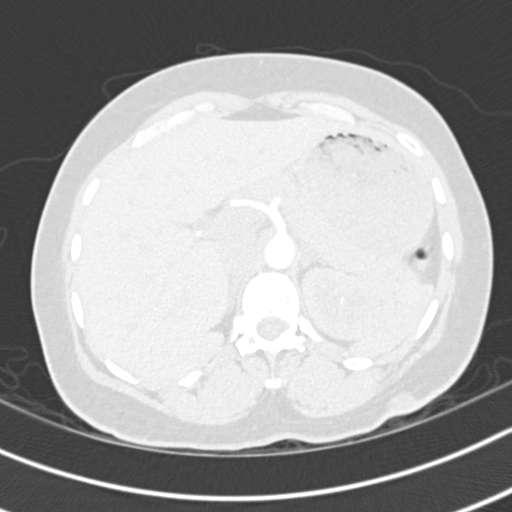
[im 33/261  mediastinal]
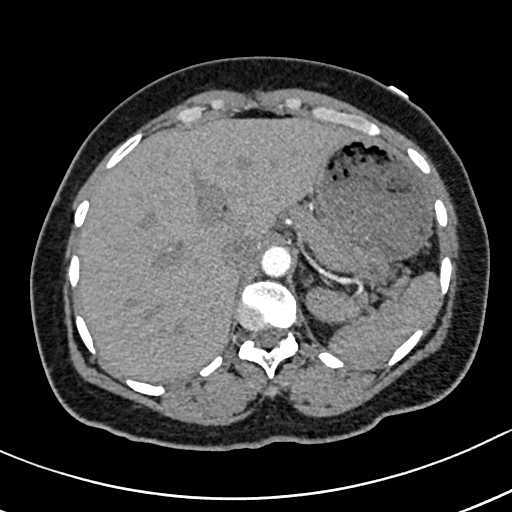
[im 49/261  lung]
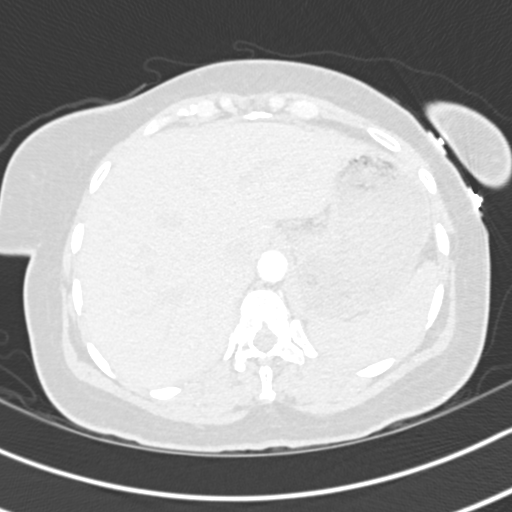
[im 66/261  mediastinal]
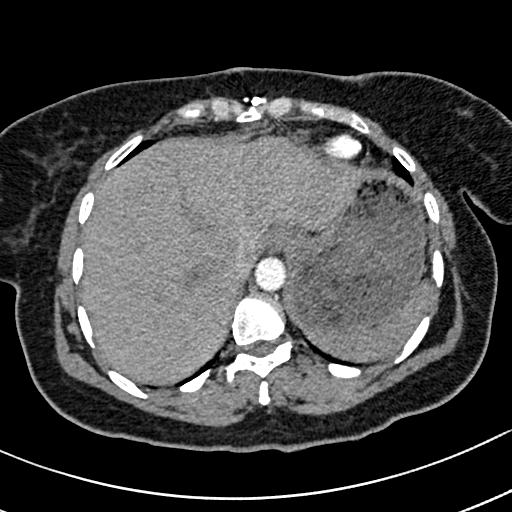
[im 82/261  lung]
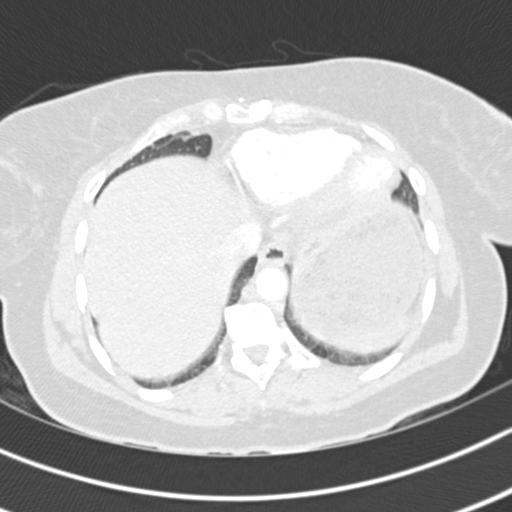
[im 98/261  mediastinal]
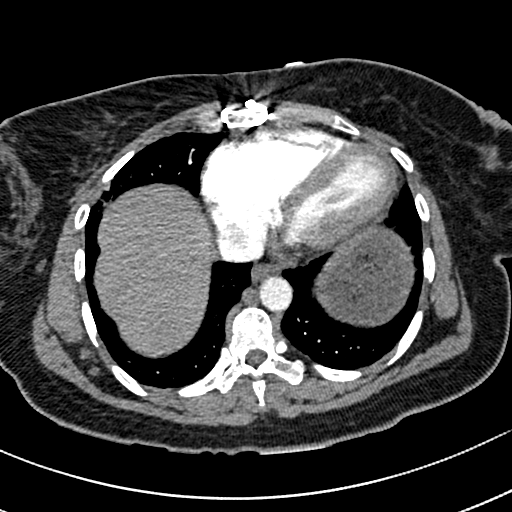
[im 114/261  lung]
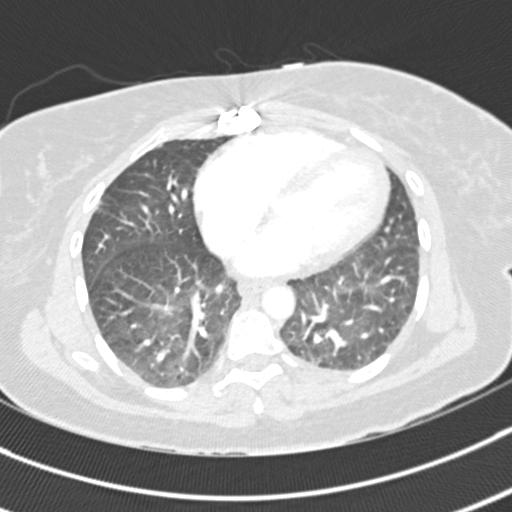
[im 131/261  mediastinal]
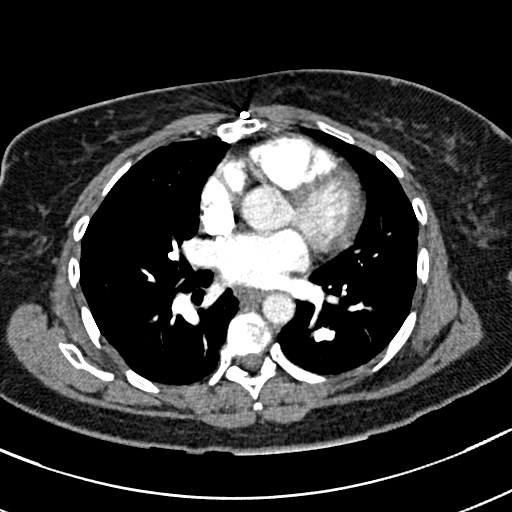
[im 147/261  lung]
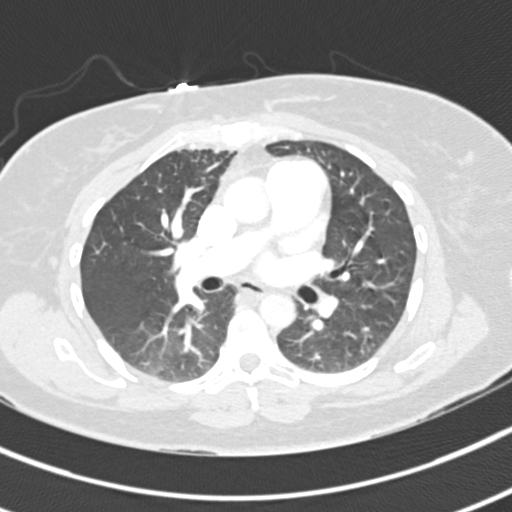
[im 163/261  mediastinal]
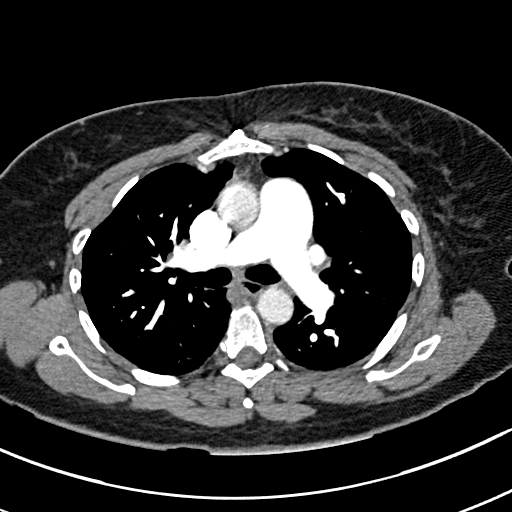
[im 179/261  lung]
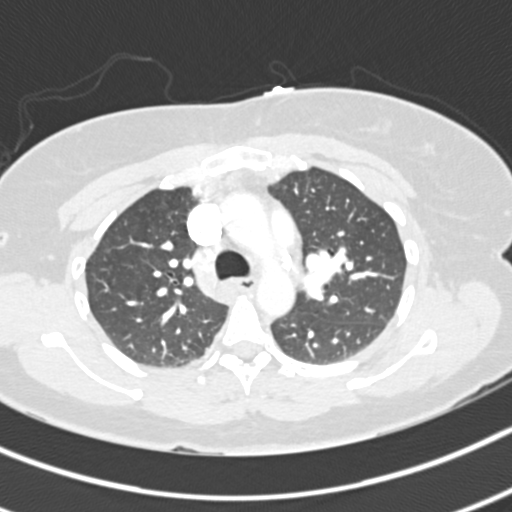
[im 196/261  mediastinal]
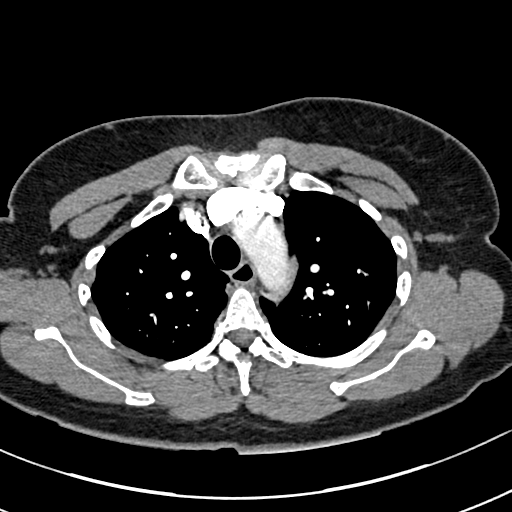
[im 212/261  lung]
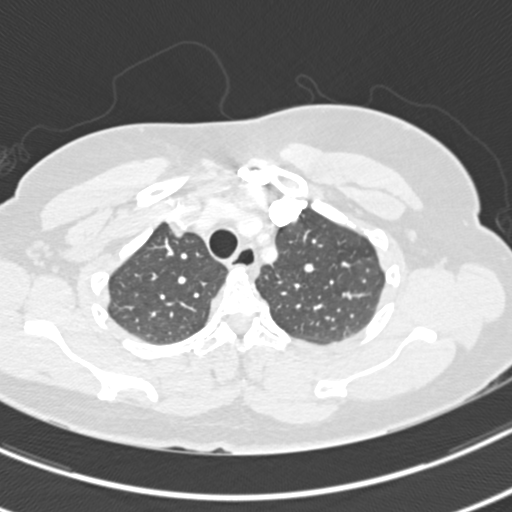
[im 228/261  mediastinal]
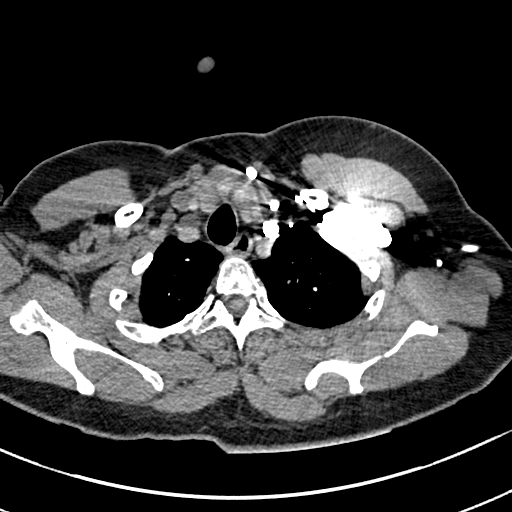
[im 244/261  lung]
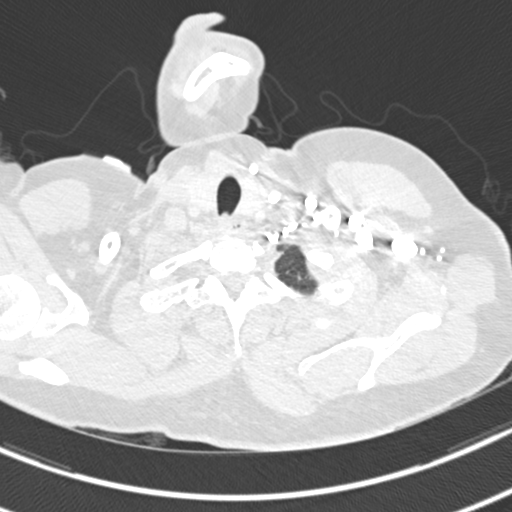

[Series 6: lung · axial · 0.62mm/px · z∈[+345,+411]mm · 2 of 66 slices shown]
[im 22/66  mediastinal]
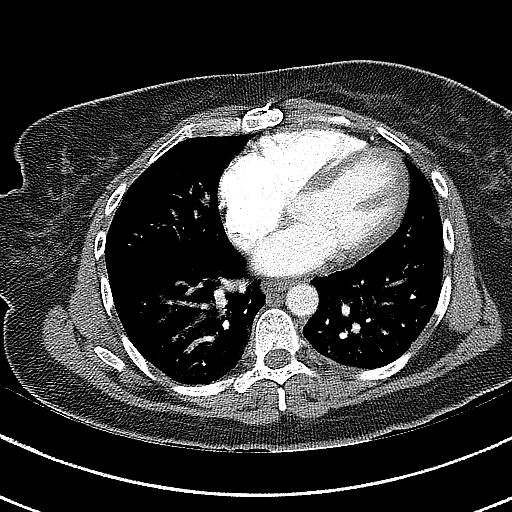
[im 44/66  mediastinal]
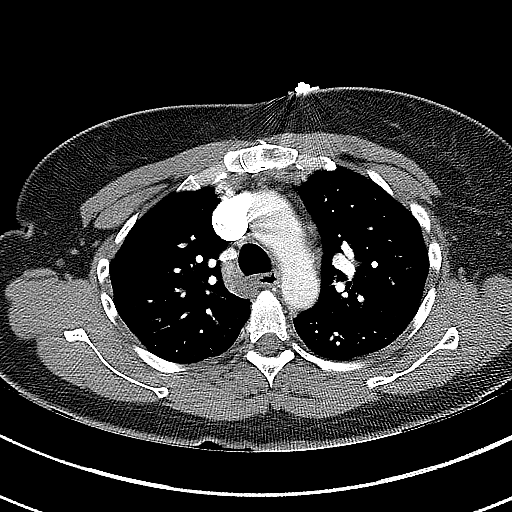

[Series 7: coronal mpr · coronal · 0.59mm/px · 1 of 151 slices shown]
[im 76/151  mediastinal]
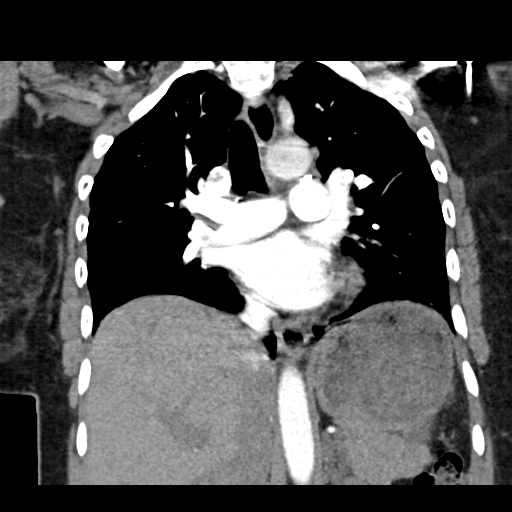

[18 of 36 positions shown; findings below may reference images not displayed]

FINDINGS: Mediastinum/Lymph Nodes: Heart size is borderline enlarged. There is
no significant pericardial fluid, thickening or pericardial
calcification. Mild dilatation of the pulmonic trunk (3.5 cm in
diameter). Surgical clips immediately above the left main pulmonary
artery, potentially from prior ductal ligation. Median sternotomy
wires are noted. No pathologically enlarged mediastinal or hilar
lymph nodes. Esophagus is unremarkable in appearance. No axillary
lymphadenopathy.

Lungs/Pleura: No acute consolidative airspace disease. No pleural
effusions. Scattered areas of mild linear scarring, similar to the
prior examination. No suspicious appearing pulmonary nodules or
masses.

Upper Abdomen: Unremarkable.

Musculoskeletal/Soft Tissues: There are no aggressive appearing
lytic or blastic lesions noted in the visualized portions of the
skeleton. Median sternotomy wires.

Review of the MIP images confirms the above findings.
IMPRESSION: 1. No evidence of pulmonary embolism. No acute findings in the
thorax.
2. Mild dilatation of the pulmonic trunk may suggest pulmonary
arterial hypertension.
3. Additional incidental findings, as above.

## 2015-01-16 MED ORDER — HYDROCODONE-ACETAMINOPHEN 5-325 MG PO TABS: 1.0000 | ORAL_TABLET | Freq: Four times a day (QID) | ORAL | Status: DC | PRN

## 2015-01-16 MED ORDER — FENTANYL CITRATE (PF) 100 MCG/2ML IJ SOLN: 100.0000 ug | Freq: Once | INTRAMUSCULAR | Status: DC

## 2015-01-16 MED ORDER — IOHEXOL 350 MG/ML SOLN
100.0000 mL | Freq: Once | INTRAVENOUS | Status: AC | PRN
  Administered 2015-01-16: 100 mL via INTRAVENOUS

## 2015-01-16 MED ORDER — OXYCODONE-ACETAMINOPHEN 5-325 MG PO TABS
1.0000 | ORAL_TABLET | Freq: Once | ORAL | Status: AC
  Administered 2015-01-16: 1 via ORAL
  Filled 2015-01-16: qty 1

## 2015-01-16 MED ORDER — MORPHINE SULFATE 4 MG/ML IJ SOLN
4.0000 mg | Freq: Once | INTRAMUSCULAR | Status: AC
  Administered 2015-01-16: 4 mg via INTRAVENOUS
  Filled 2015-01-16: qty 1

## 2015-01-16 NOTE — ED Notes (Signed)
Patient returned from CT/XRay

## 2015-01-16 NOTE — ED Notes (Signed)
Midsternal CP that radiates to the back; started an hour ago. Feels weak and dizzy. Patient speaking very softly and acting lethargic. Able to assist with undressing and lifting body when prompted. In the ED on Sunday for chest pain/anxiety related to unexpected death in the family of a child. States the pain went away between Sunday and today, but now is back with breathing. Patient has eyes closed and is crying.

## 2015-01-16 NOTE — Telephone Encounter (Signed)
[facility]  Patient Name: [name]  DOB: 31-Oct-1970    Initial Comment Caller states her heart is burning, dizzy and chest pain md is jones   Nurse Assessment  Nurse: Germain Osgood, RN, Opal Sidles Date/Time [name] Time): 01/16/2015 4:08:18 PM  Confirm and document reason for call. If symptomatic, describe symptoms. ---Caller reports she is dizzy, has chest pain Has blood clot in left lung. Heart is feeling funny  Has the patient traveled out of the country within the last 30 days? ---No  Does the patient require triage? ---Yes  Related visit to physician within the last 2 weeks? ---Yes  Does the PT have any chronic conditions? (i.e. diabetes, asthma, etc.) ---Yes  List chronic conditions. ---Blood clot,  Did the patient indicate they were pregnant? ---No     Guidelines    Guideline Title Affirmed Question Affirmed Notes  Chest Pain [1] Chest pain lasts > 5 minutes AND [2] described as crushing, pressure-like, or heavy    Final Disposition User   Call EMS 911 Now Germain Osgood, Therapist, sports, Opal Sidles

## 2015-01-16 NOTE — ED Provider Notes (Signed)
CSN: [id number]     Arrival date & time 01/16/15  1715 History   First MD Initiated Contact with Patient 01/16/15 1716     Chief Complaint  Patient presents with  . Chest Pain     (Consider location/radiation/quality/duration/timing/severity/associated sxs/prior Treatment) HPI Patient presents to the emergency department with chest discomfort that started earlier today.  The patient recently was seen in the emergency department for similar complaints.  The patient was evaluated on June 12, the same day that her nephew died from drowning at an apartment complex pool.  The patient is very tearful during my exam.  She states that she does not have any shortness of breath, nausea, vomiting, weakness, dizziness, headache, blurred vision, pain, fever, cough, runny nose, sore throat, dysuria, incontinence or syncope.  The patient does have a history of pulmonary embolism.  The patient states that the pain does seem to wrap around to her back just below her shoulder blades.  The patient does appear to be very upset and will not make eye contact with me during the exam Past Medical History  Diagnosis Date  . Pulmonary embolism 2005       . CTEPH (chronic thromboembolic pulmonary hypertension)   . Primary hypercoagulable state   . Hepatitis B infection     Hep B core Ab 04/2005  . DVT (deep venous thrombosis)   . Seasonal allergies    Past Surgical History  Procedure Laterality Date  . Pulmonary artery endarterectomy  2007  . Cardiac catheterization  11/2005    R heart cath : Severe pulmonary arterial HTN with evidence of cor pulmonale. Vasodilatory challenge not performed.  . Vena cava filter placement     Family History  Problem Relation Age of Onset  . Hypertension Mother   . Hypertension Father     deceased  . Alcohol abuse Neg Hx   . Cancer Neg Hx   . Diabetes Neg Hx   . Early death Neg Hx   . Heart disease Neg Hx   . Hyperlipidemia Neg Hx   . Kidney disease Neg Hx   . Stroke Neg  Hx    History  Substance Use Topics  . Smoking status: Never Smoker   . Smokeless tobacco: Never Used  . Alcohol Use: No   OB History    Gravida Para Term Preterm AB TAB SAB Ectopic Multiple Living   4 3 3  1  1   3      Review of Systems  All other systems negative except as documented in the HPI. All pertinent positives and negatives as reviewed in the HPI.  Allergies  Aspirin  Home Medications   Prior to Admission medications   Medication Sig Start Date End Date Taking? Authorizing Provider  acetaminophen (TYLENOL) 325 MG tablet Take 325 mg by mouth daily as needed for moderate pain. For pain    Historical Provider, MD  albuterol (PROVENTIL HFA;VENTOLIN HFA) 108 (90 BASE) MCG/ACT inhaler Inhale 2 puffs into the lungs daily as needed for shortness of breath. For shortness of breath or wheezing 08/26/14   Chesley Mires, MD  fluticasone (FLONASE) 50 MCG/ACT nasal spray Place 2 sprays into the nose 2 (two) times a week. 05/16/13 07/23/15  Chesley Mires, MD  HYDROcodone-homatropine (HYCODAN) 5-1.5 MG/5ML syrup Take 5 mLs by mouth every 8 (eight) hours as needed for cough. 06/19/14   Janith Lima, MD  LORazepam (ATIVAN) 1 MG tablet Take 1 tablet (1 mg total) by mouth every 4 (four)  hours as needed for anxiety. 01/12/15   Tatyana Kirichenko, PA-C  Multiple Vitamin (MULTIVITAMIN WITH MINERALS) TABS Take 1 tablet by mouth daily. Centrum    Historical Provider, MD  rivaroxaban (XARELTO) 20 MG TABS tablet Take 1 tablet (20 mg total) by mouth daily. TAKE 1 TABLET (20 MG TOTAL) BY MOUTH EVERY MORNING. 08/26/14   Chesley Mires, MD   SpO2 100% Physical Exam  Constitutional: She is oriented to person, place, and time. She appears well-developed and well-nourished. No distress.  HENT:  Head: Normocephalic and atraumatic.  Mouth/Throat: Oropharynx is clear and moist.  Eyes: Pupils are equal, round, and reactive to light.  Neck: Normal range of motion. Neck supple.  Cardiovascular: Normal rate,  regular rhythm and normal heart sounds.  Exam reveals no gallop and no friction rub.   No murmur heard. Pulmonary/Chest: Effort normal and breath sounds normal. No respiratory distress.  Musculoskeletal: She exhibits no edema.  Neurological: She is alert and oriented to person, place, and time. She exhibits normal muscle tone. Coordination normal.  Skin: Skin is warm and dry. No rash noted. No erythema.  Nursing note and vitals reviewed.   ED Course  Procedures (including critical care time)   I feel the patient is still having increased type reaction due to the fact that she is crying during the entire history of present illness and physical exam, the patient does not appear to be in any pain.  Patient has 2  negative troponins.  She also had a negative CTA of her chest.  This was done due to the fact she has a history of pulmonary embolus with chest pain  Dalia Heading, PA-C 01/16/15 2058  Orlie Dakin, MD 01/17/15 5638

## 2015-01-16 NOTE — ED Notes (Signed)
Pt's family stopped by the nurse's station to let us know that they are leaving.

## 2015-01-16 NOTE — Discharge Instructions (Signed)
Yours CT scan and laboratory testing was normal.  Follow-up with your primary care doctor

## 2015-01-16 NOTE — ED Provider Notes (Signed)
Complains of anterior chest pain radiating to back onset 2:30 PM today pain is worse with deep inspiration. No associated nausea or sweatiness. No treatment prior to coming here. Other associated symptoms include generalized weakness. Cardiac risk factors none. Patient has history of pulmonary embolism. On exam tearful alert Glasgow Coma Score 15 lungs clear to auscultation heart regular rate and rhythm chest is tender anteriorly, producing pain. Abdomen nontender nondistended Pretest clinical suspicion for pulmonary embolism is low to moderate however patient has history of pulmonary embolism therefore we will obtain CT angiogram of chest check for pulmonary embolism. Heart score equals 2 based on age, EKG  Orlie Dakin, MD 01/17/15 [phone]

## 2015-01-16 NOTE — ED Notes (Signed)
Patient transported to X-ray 

## 2015-01-17 ENCOUNTER — Ambulatory Visit (INDEPENDENT_AMBULATORY_CARE_PROVIDER_SITE_OTHER): Payer: BLUE CROSS/BLUE SHIELD | Admitting: Obstetrics & Gynecology

## 2015-01-17 ENCOUNTER — Encounter: Payer: Self-pay | Admitting: Obstetrics & Gynecology

## 2015-01-17 ENCOUNTER — Telehealth: Payer: Self-pay

## 2015-01-17 ENCOUNTER — Other Ambulatory Visit: Payer: Self-pay | Admitting: Internal Medicine

## 2015-01-17 VITALS — BP 116/79 | HR 83 | Temp 98.3°F | Ht 67.0 in | Wt 165.3 lb

## 2015-01-17 DIAGNOSIS — Z118 Encounter for screening for other infectious and parasitic diseases: Secondary | ICD-10-CM | POA: Diagnosis not present

## 2015-01-17 DIAGNOSIS — Z0000 Encounter for general adult medical examination without abnormal findings: Secondary | ICD-10-CM | POA: Diagnosis not present

## 2015-01-17 DIAGNOSIS — N898 Other specified noninflammatory disorders of vagina: Secondary | ICD-10-CM

## 2015-01-17 DIAGNOSIS — Z1231 Encounter for screening mammogram for malignant neoplasm of breast: Secondary | ICD-10-CM | POA: Diagnosis not present

## 2015-01-17 DIAGNOSIS — L298 Other pruritus: Secondary | ICD-10-CM

## 2015-01-17 DIAGNOSIS — Z113 Encounter for screening for infections with a predominantly sexual mode of transmission: Secondary | ICD-10-CM | POA: Diagnosis not present

## 2015-01-17 MED ORDER — METRONIDAZOLE 500 MG PO TABS: 500.0000 mg | ORAL_TABLET | Freq: Two times a day (BID) | ORAL | Status: DC

## 2015-01-17 NOTE — Progress Notes (Signed)
Screening mammogram scheduled for September 30 th @ 0715.

## 2015-01-17 NOTE — Telephone Encounter (Signed)
Pt was seen in the ER

## 2015-01-17 NOTE — Progress Notes (Signed)
Subjective:    [name] is a 44 y.o. MAfrican P3 (27,24, and 55 yo kids) female who presents for an annual exam. The patient has no complaints today except for vaginal itching for about a month.  The patient is not currently sexually active as her husband has cancer. GYN screening history: last pap: was normal. The patient wears seatbelts: yes. The patient participates in regular exercise: no. Has the patient ever been transfused or tattooed?: no. The patient reports that there is not domestic violence in her life.   Menstrual History: OB History    Gravida Para Term Preterm AB TAB SAB Ectopic Multiple Living   4 3 3  1  1   3       Menarche age: 42  Patient's last menstrual period was 01/06/2015.    The following portions of the patient's history were reviewed and updated as appropriate: allergies, current medications, past family history, past medical history, past social history, past surgical history and problem list.  Review of Systems A comprehensive review of systems was negative. Works at [employer]. Mammogram due 8/16.   Objective:    BP 116/79 mmHg  Pulse 83  Temp(Src) 98.3 F (36.8 C) (Oral)  Ht 5' 7"  (1.702 m)  Wt 165 lb 4.8 oz (74.98 kg)  BMI 25.88 kg/m2  LMP 01/06/2015  General Appearance:    Alert, cooperative, no distress, appears stated age  Head:    Normocephalic, without obvious abnormality, atraumatic  Eyes:    PERRL, conjunctiva/corneas clear, EOM's intact, fundi    benign, both eyes  Ears:    Normal TM's and external ear canals, both ears  Nose:   Nares normal, septum midline, mucosa normal, no drainage    or sinus tenderness  Throat:   Lips, mucosa, and tongue normal; teeth and gums normal  Neck:   Supple, symmetrical, trachea midline, no adenopathy;    thyroid:  no enlargement/tenderness/nodules; no carotid   bruit or JVD  Back:     Symmetric, no curvature, ROM normal, no CVA tenderness  Lungs:     Clear to auscultation bilaterally, respirations  unlabored  Chest Wall:    No tenderness or deformity   Heart:    Regular rate and rhythm, S1 and S2 normal, no murmur, rub   or gallop  Breast Exam:    No tenderness, masses, or nipple abnormality  Abdomen:     Soft, non-tender, bowel sounds active all four quadrants,    no masses, no organomegaly  Genitalia:    Normal female without lesion, discharge or tenderness, discharge c/w BV. NSSA, NT, mobile, normal adnexa     Extremities:   Extremities normal, atraumatic, no cyanosis or edema  Pulses:   2+ and symmetric all extremities  Skin:   Skin color, texture, turgor normal, no rashes or lesions  Lymph nodes:   Cervical, supraclavicular, and axillary nodes normal  Neurologic:   CNII-XII intact, normal strength, sensation and reflexes    throughout  .    Assessment:    Healthy female exam.   Vaginal itching Plan:     Breast self exam technique reviewed and patient encouraged to perform self-exam monthly. Mammogram. Thin prep Pap smear. with cotesting,  Treat with flagyl and wet prep sent

## 2015-01-17 NOTE — Telephone Encounter (Signed)
[facility] Patient Name: [name] Gender: Unknown DOB: 11/07/1970 Age: 44 Y 1 M 6 D Return Phone Number: [phone] (Primary) Address: City/State/Zip: [place] [address] Client [facility] - Client Client Site [place] - Day Physician Jones, Boy[place] Type Call Call Type Triage / Clinical Relationship To Patient Self Appointment Disposition EMR Appointment Not Necessary Info pasted into Epic Yes Return Phone Number [phone] (Primary) Chief Complaint CHEST PAIN (>=21 years) - pain, pressure, heaviness or tightness Initial Comment Caller states her heart is burning, dizzy and chest pain md is jones PreDisposition Go to ED Nurse Assessment Nurse: Germain Osgood, RN, Opal Sidles Date/Time Eilene Ghazi Time): 01/16/2015 4:08:18 PM Confirm and document reason for call. If symptomatic, describe symptoms. ---Caller reports she is dizzy, has chest pain Has blood clot in left lung. Heart is feeling funny Has the patient traveled out of the country within the last 30 days? ---No Does the patient require triage? ---Yes Related visit to physician within the last 2 weeks? ---Yes Does the PT have any chronic conditions? (i.e. diabetes, asthma, etc.) ---Yes List chronic conditions. ---Blood clot, Did the patient indicate they were pregnant? ---No Guidelines Guideline Title Affirmed Question Affirmed Notes Nurse Date/Time Eilene Ghazi Time) Chest Pain [1] Chest pain lasts > 5 minutes AND [2] described as crushing, pressure-like, or heavy Rowan Blase 01/16/2015 4:09:46 PM Disp. Time Eilene Ghazi Time) Disposition Final User 01/16/2015 4:05:48 PM Send to Urgent Janetta Hora 01/16/2015 4:11:51 PM Send To RN Personal Germain Osgood, RN, Jane 01/16/2015 4:17:44 PM 911 Outcome Documentation Germain Osgood, RN, Opal Sidles Reason: Message left. 01/16/2015 4:17:55 PM Call Completed Germain Osgood RN,  Ricard Dillon NOTE: All timestamps contained within this report are represented as [place] Standard Time. CONFIDENTIALTY NOTICE: This fax transmission is intended only for the addressee. It contains information that is legally privileged, confidential or otherwise protected from use or disclosure. If you are not the intended recipient, you are strictly prohibited from reviewing, disclosing, copying using or disseminating any of this information or taking any action in reliance on or regarding this information. If you have received this fax in error, please notify us immediately by telephone so that we can arrange for its return to [place]. Phone: [phone], Toll-Free: [phone], Fax: [phone] Page: 2 of 2 Call Id: [id number] 01/16/2015 4:11:23 PM Call EMS 911 Now Yes Germain Osgood, RN, Irving Copas Understands: Yes Disagree/Comply: Comply Care Advice Given Per Guideline CALL EMS 911 NOW: Immediate medical attention is needed. You need to hang up and call 911 (or an ambulance). Psychologist, forensic Discretion: I'll call you back in a few minutes to be sure you were able to reach them.) CARE ADVICE given per Chest Pain (Adult) guideline. After Care Instructions Given Call Event Type User Date / Time Description

## 2015-01-18 LAB — WET PREP FOR TRICH, YEAST, CLUE
Clue Cells Wet Prep HPF POC: NONE SEEN
Trich, Wet Prep: NONE SEEN
YEAST WET PREP: NONE SEEN

## 2015-01-20 LAB — CYTOLOGY - PAP

## 2015-01-23 IMAGING — US US ABDOMEN COMPLETE
1 series · 14 of 25 positions shown · non-contrast
Comparison: CT abdomen pelvis [DATE]

CLINICAL DATA: Chronic viral hepatitis-B without delta agent and
without coma. Screen for hepatocellular carcinoma.

EXAM:
ABDOMEN ULTRASOUND COMPLETE

[Series 1: us abdomen complete · 0.22mm/px · 14 of 72 slices shown]
[im 1/72]
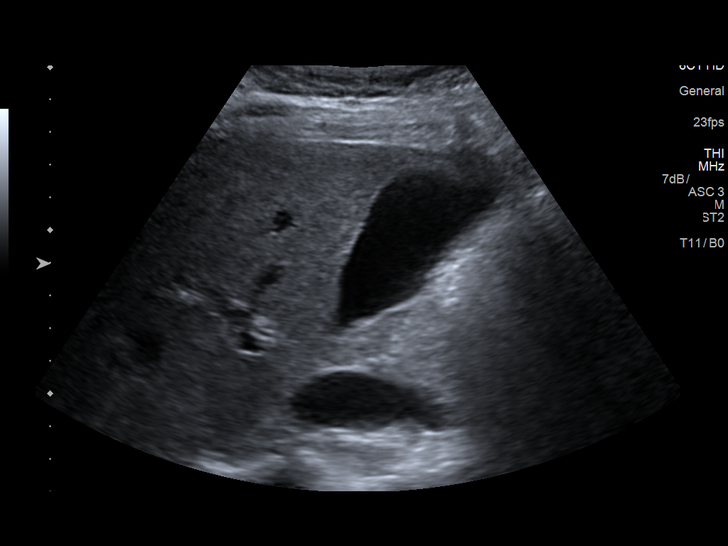
[im 6/72]
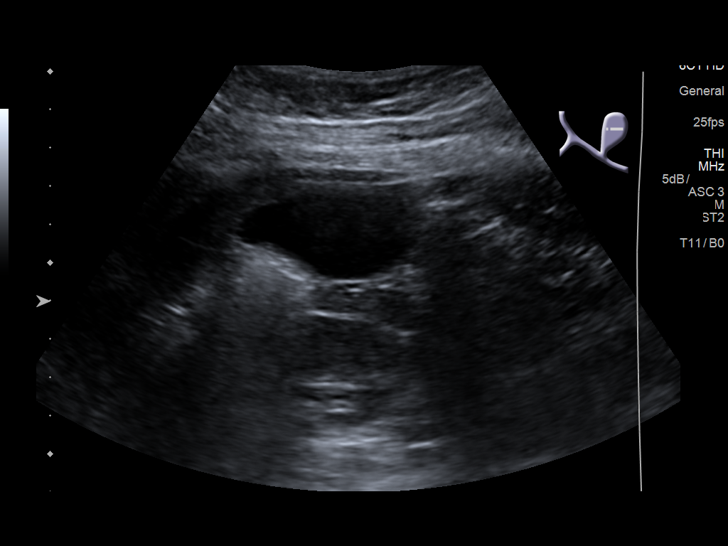
[im 12/72]
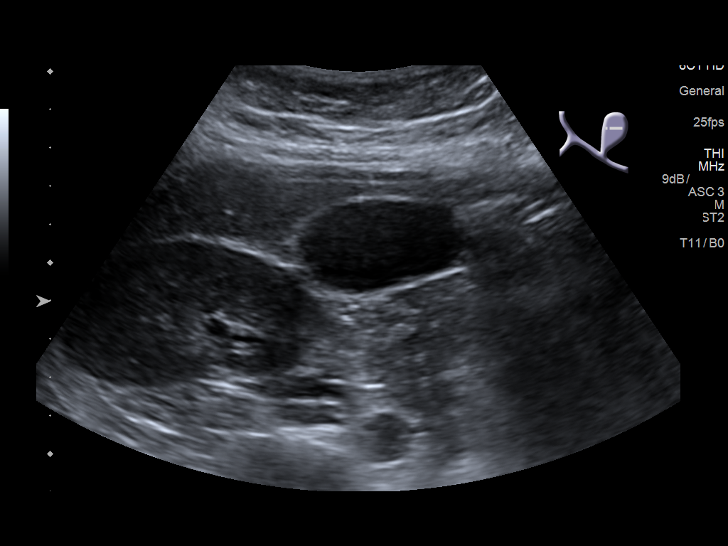
[im 18/72]
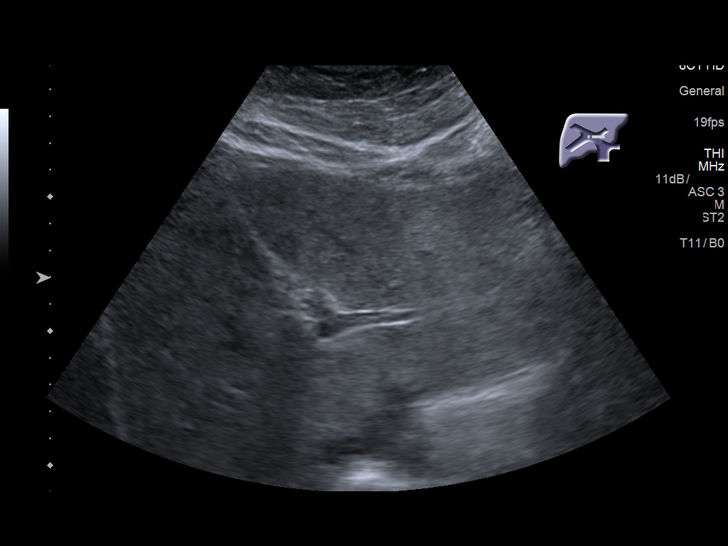
[im 24/72]
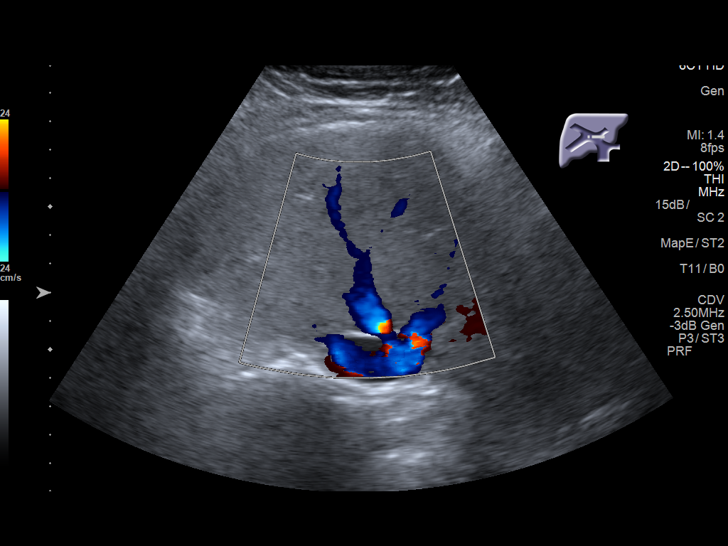
[im 27/72]
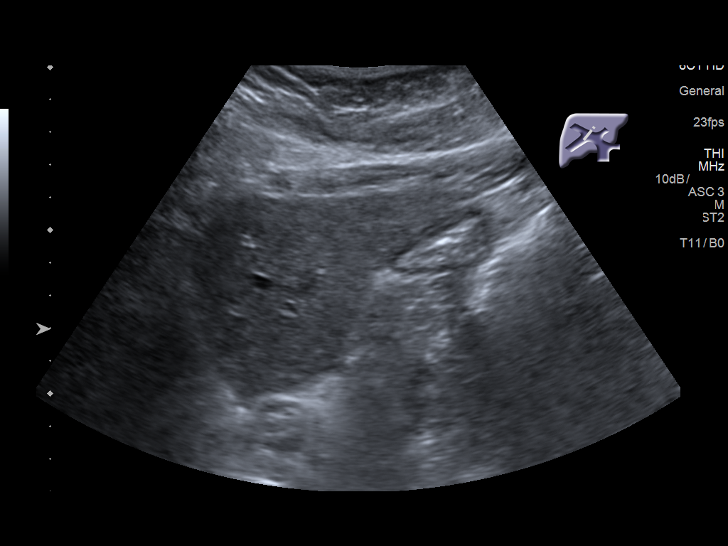
[im 33/72]
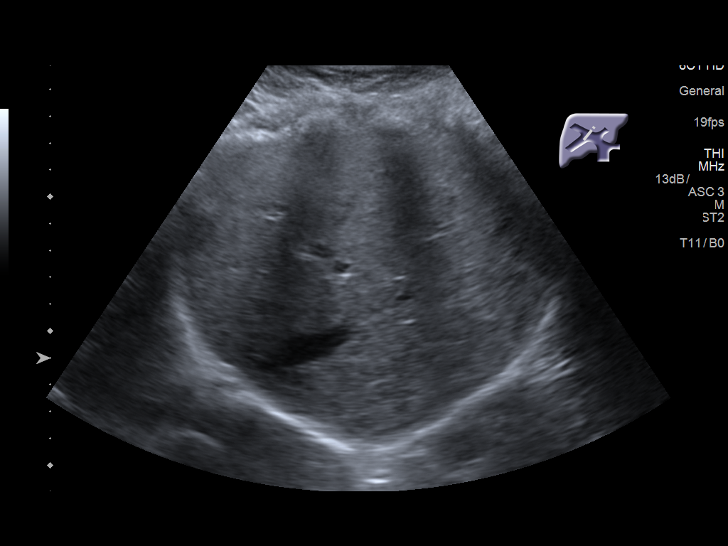
[im 39/72]
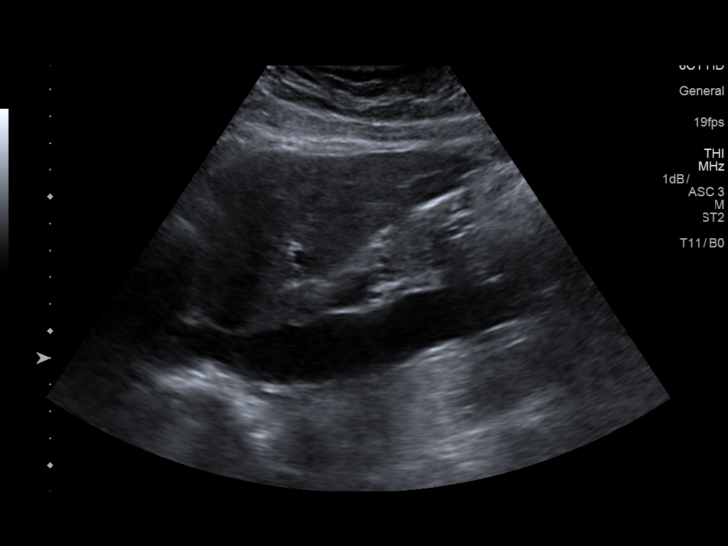
[im 45/72]
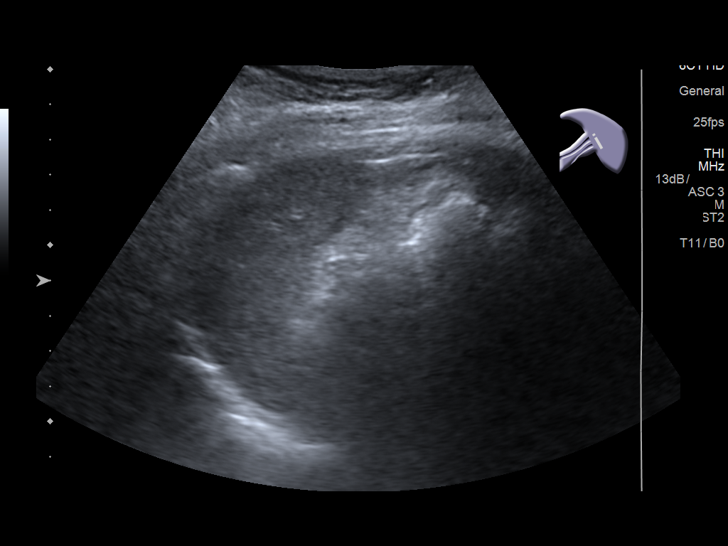
[im 48/72]
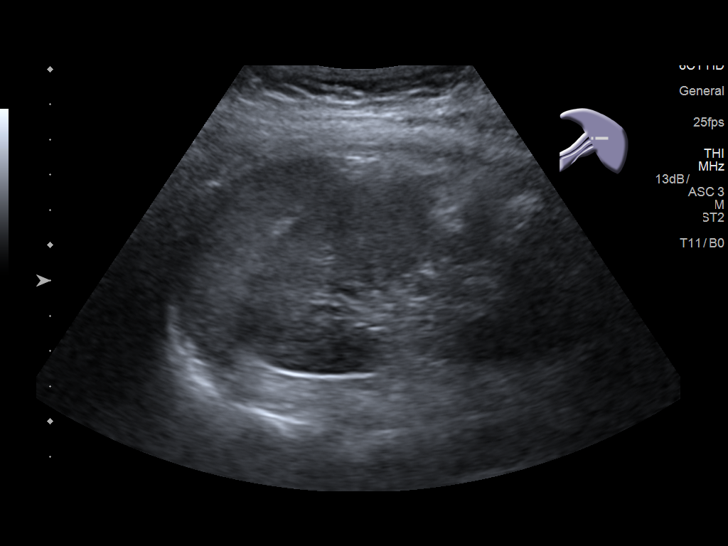
[im 54/72]
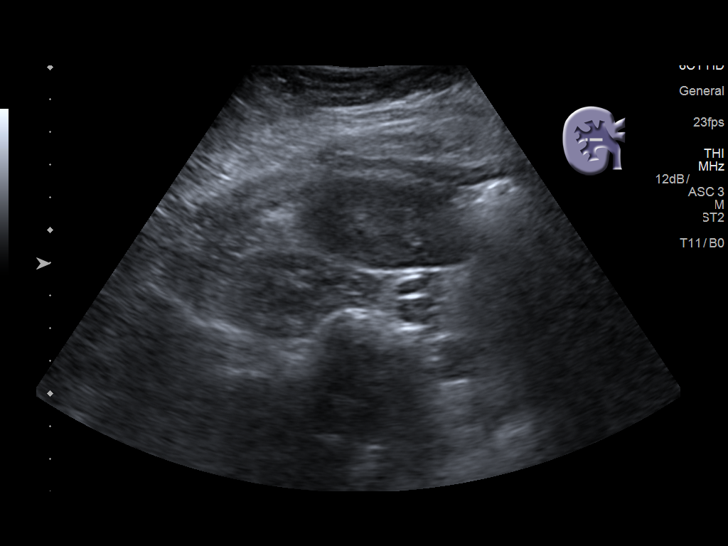
[im 60/72]
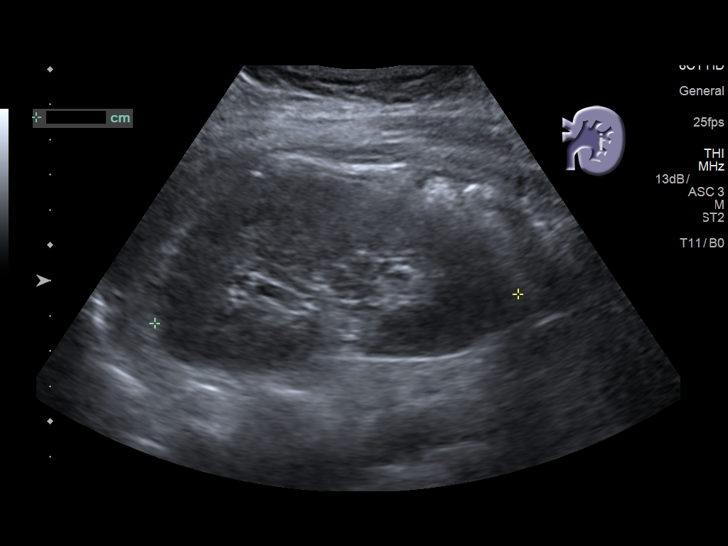
[im 66/72]
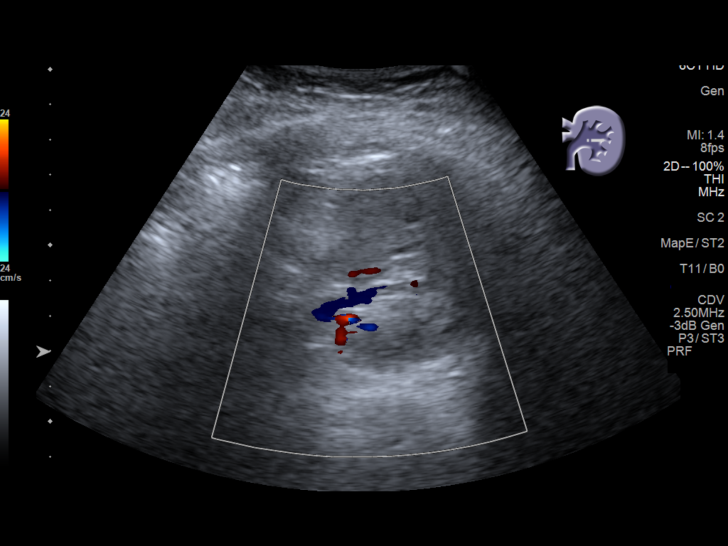
[im 72/72]
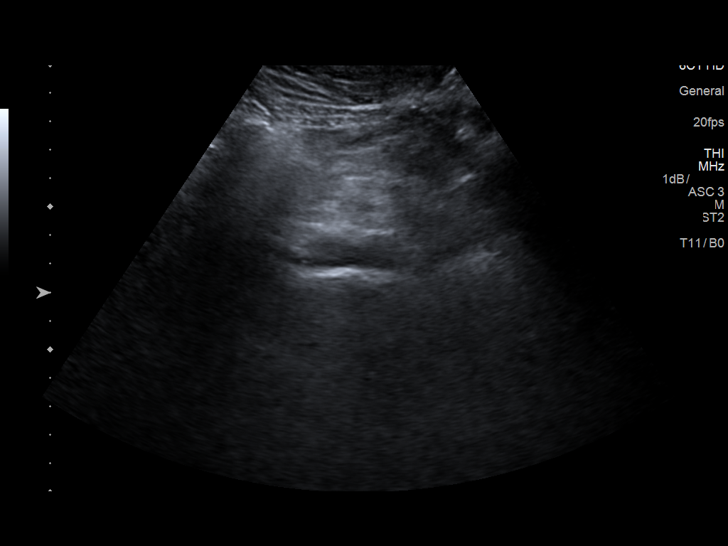

[14 of 25 positions shown; findings below may reference images not displayed]

FINDINGS: Gallbladder: No gallstones or wall thickening visualized. No
sonographic Murphy sign noted by sonographer.

Common bile duct: Diameter: 2.6 mm

Liver: No focal lesion identified. Within normal limits in
parenchymal echogenicity.

IVC: No abnormality visualized.

Pancreas: Visualized portion unremarkable.

Spleen: Size and appearance within normal limits.

Right Kidney: Length: 10.1 cm. Echogenicity within normal limits. No
mass or hydronephrosis visualized.

Left Kidney: Length: 10.3 cm. Echogenicity within normal limits. No
mass or hydronephrosis visualized.

Abdominal aorta: No aneurysm visualized.

Other findings: None.
IMPRESSION: Sonographically normal liver. No mass lesion. Negative for
cholelithiasis.

## 2015-03-31 ENCOUNTER — Telehealth: Payer: Self-pay | Admitting: Pulmonary Disease

## 2015-03-31 NOTE — Telephone Encounter (Signed)
Called and spoke to pt. Pt stated she no longer needs our assistance because she thought she was out of her albuterol hfa, pt states she actually has one more left. Pt denies needing anything at this time. Will sign off.

## 2015-05-02 ENCOUNTER — Ambulatory Visit (HOSPITAL_COMMUNITY)
Admission: RE | Admit: 2015-05-02 | Discharge: 2015-05-02 | Disposition: A | Discharge status: Home / Self Care | Payer: BLUE CROSS/BLUE SHIELD | Source: Ambulatory Visit | Attending: Obstetrics & Gynecology | Admitting: Obstetrics & Gynecology

## 2015-05-02 DIAGNOSIS — Z1231 Encounter for screening mammogram for malignant neoplasm of breast: Secondary | ICD-10-CM

## 2015-05-02 IMAGING — MG MM DIGITAL SCREENING BILAT
6 series · 6 of 6 positions shown · non-contrast
Comparison: Previous exam(s).

CLINICAL DATA: Screening.

EXAM:
DIGITAL SCREENING BILATERAL MAMMOGRAM WITH CAD

[R CC]
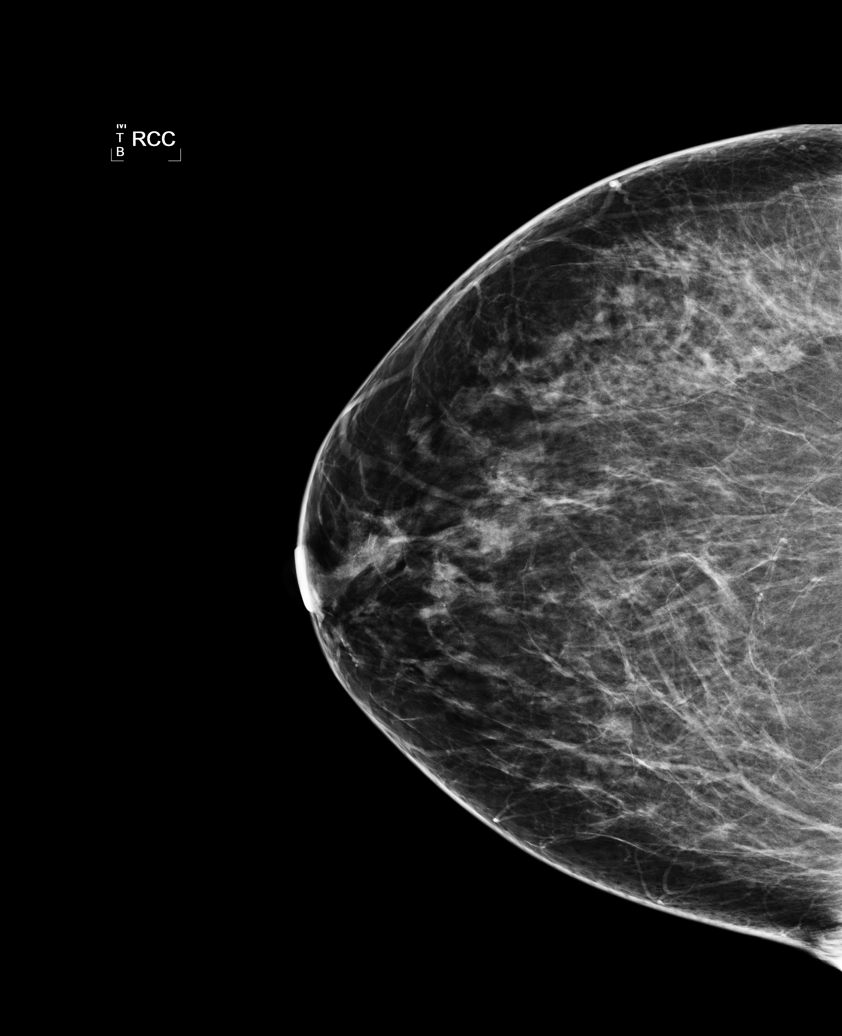

[R MLO (1 of 2)]
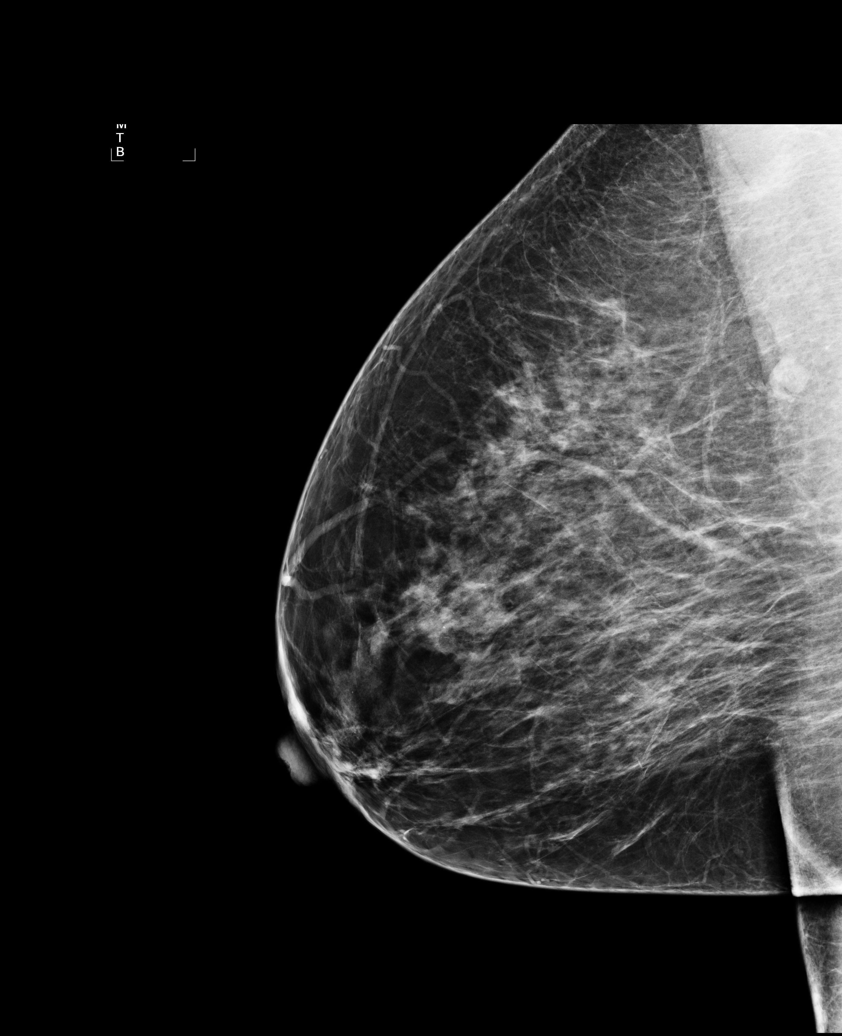

[L CC]
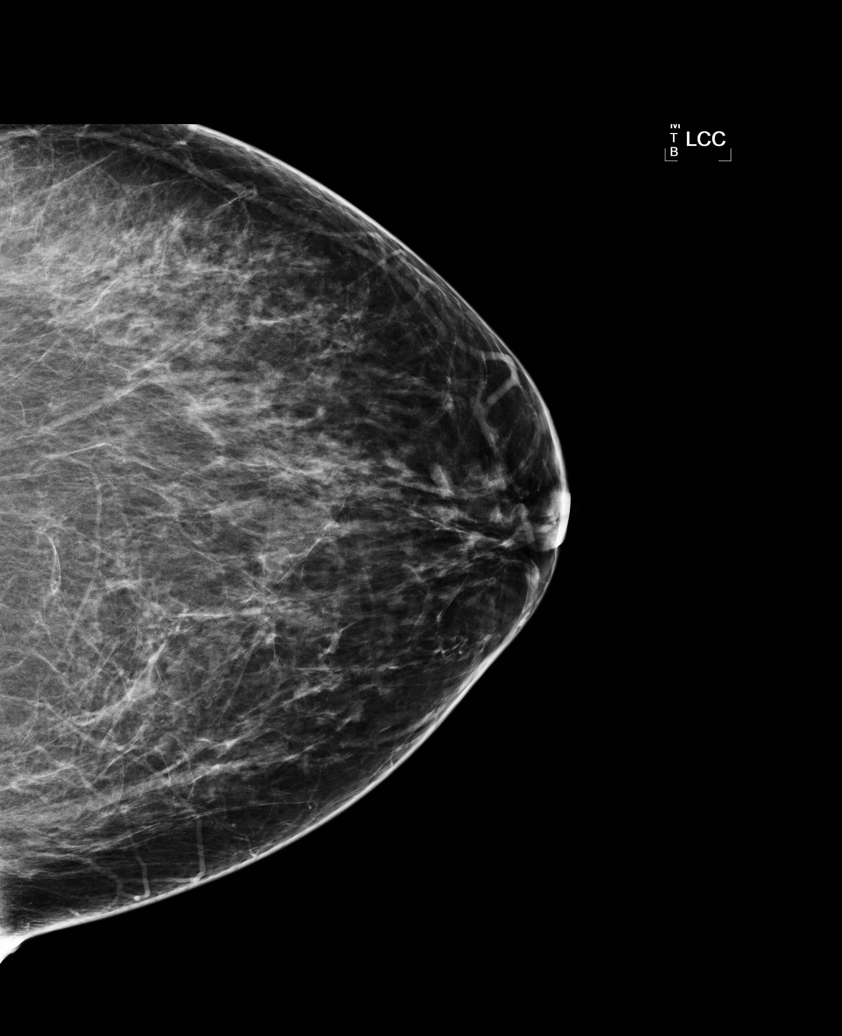

[L MLO]
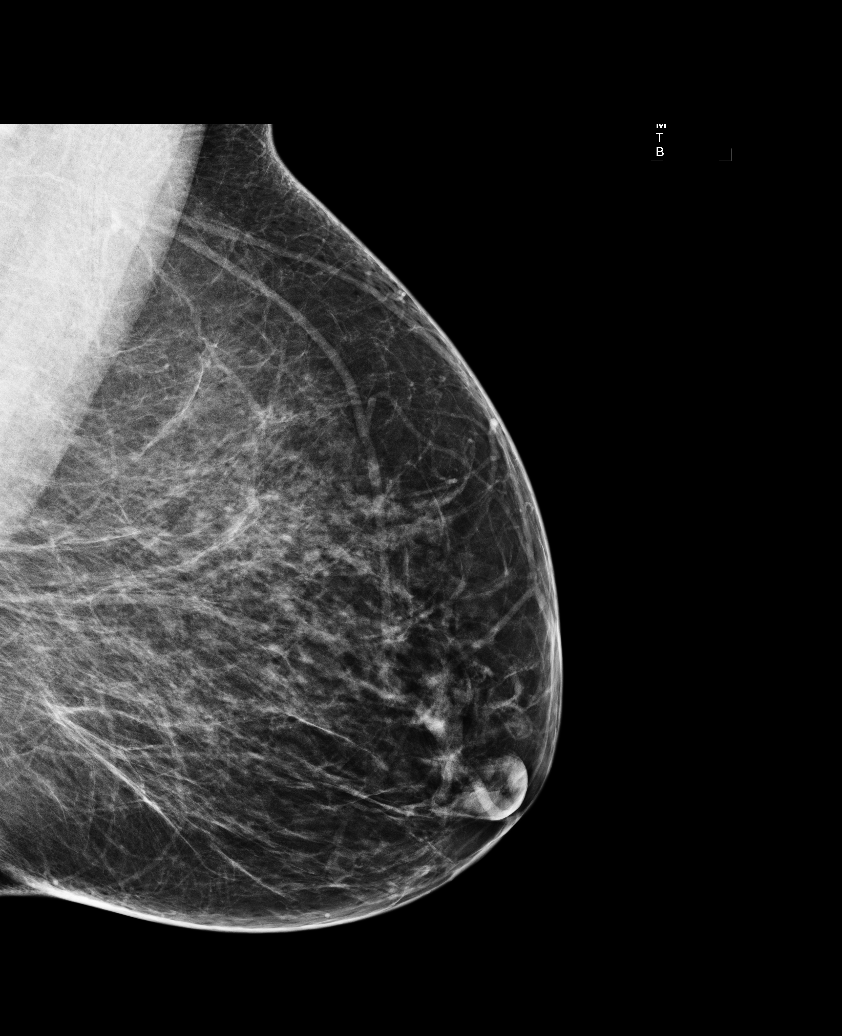

[L XCCL]
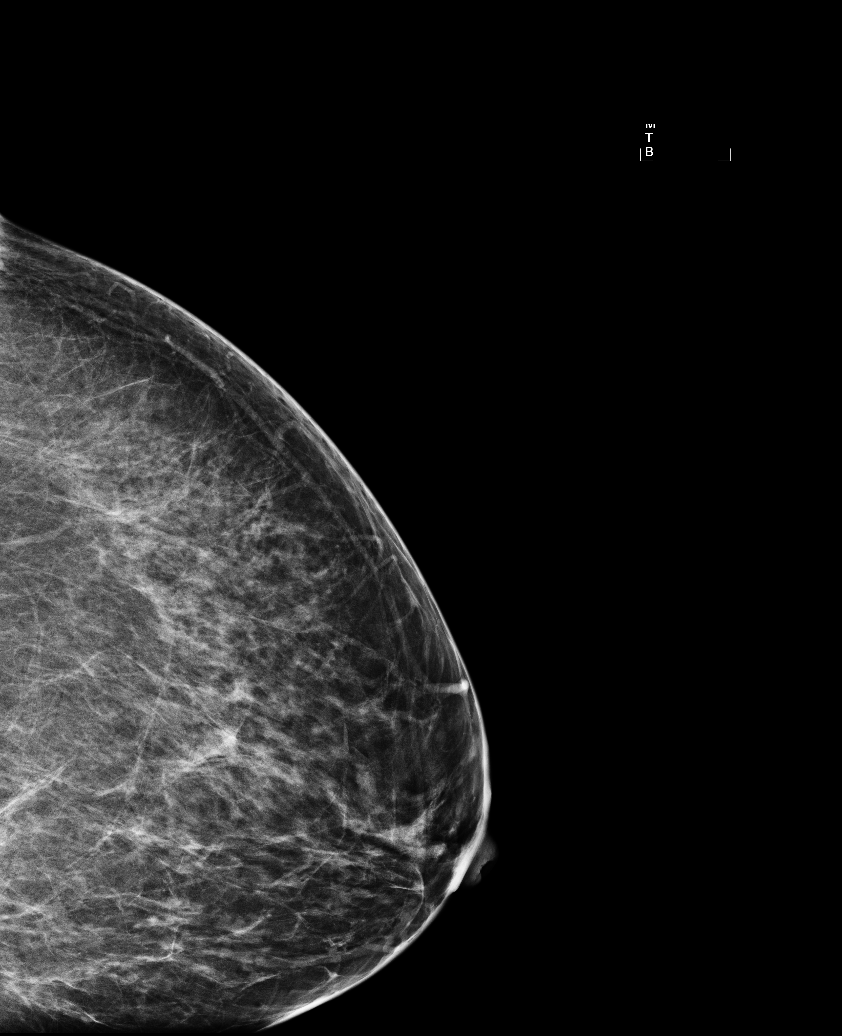

[R MLO (2 of 2)]
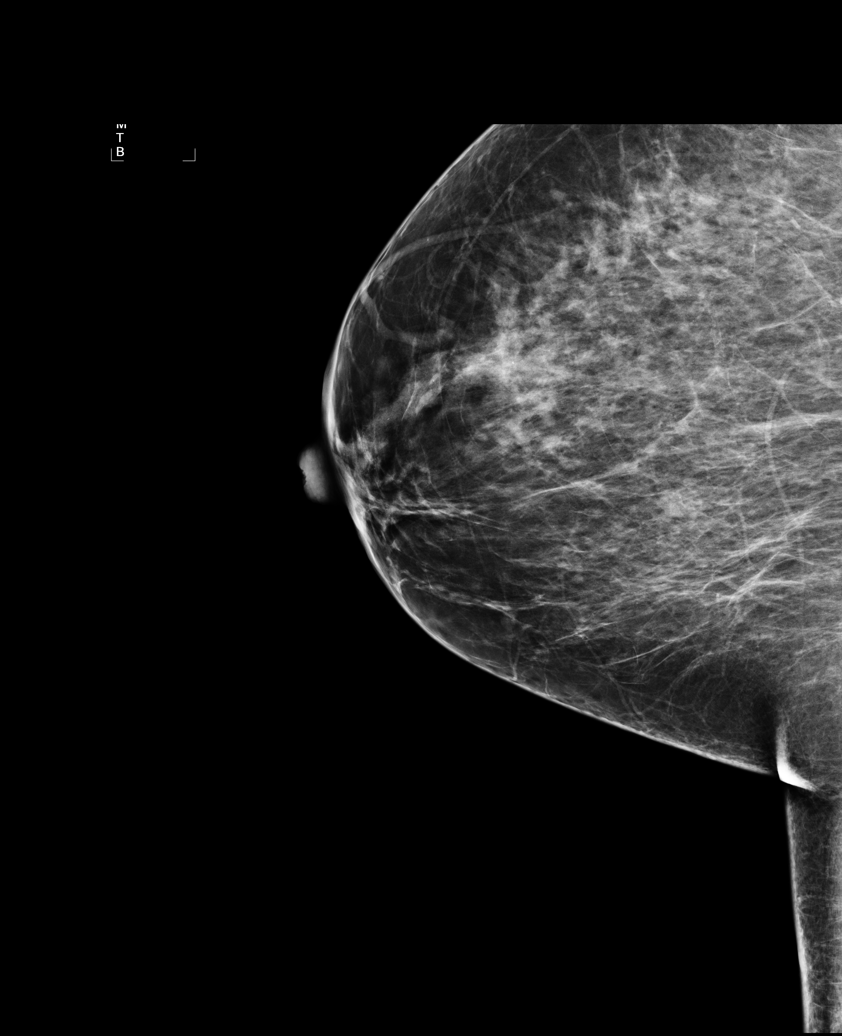

[6 of 6 positions shown; findings below may reference images not displayed]

ACR Breast Density Category b: There are scattered areas of
fibroglandular density.
FINDINGS: In the right breast, a possible mass warrants further evaluation. In
the left breast, no findings suspicious for malignancy. Images were
processed with CAD.
IMPRESSION: Further evaluation is suggested for possible mass in the right
breast.

RECOMMENDATION:
Diagnostic mammogram and possibly ultrasound of the right breast.
(Code:[DA])

The patient will be contacted regarding the findings, and additional
imaging will be scheduled.

BI-RADS CATEGORY  0: Incomplete. Need additional imaging evaluation
and/or prior mammograms for comparison.

## 2015-05-07 ENCOUNTER — Other Ambulatory Visit (HOSPITAL_COMMUNITY): Payer: Self-pay | Admitting: Obstetrics & Gynecology

## 2015-05-07 DIAGNOSIS — R928 Other abnormal and inconclusive findings on diagnostic imaging of breast: Secondary | ICD-10-CM

## 2015-05-15 ENCOUNTER — Ambulatory Visit
Admission: RE | Admit: 2015-05-15 | Discharge: 2015-05-15 | Disposition: A | Discharge status: Home / Self Care | Payer: BLUE CROSS/BLUE SHIELD | Source: Ambulatory Visit | Attending: Obstetrics & Gynecology | Admitting: Obstetrics & Gynecology

## 2015-05-15 DIAGNOSIS — R928 Other abnormal and inconclusive findings on diagnostic imaging of breast: Secondary | ICD-10-CM

## 2015-05-15 IMAGING — MG MM DIAG BREAST TOMO UNI RIGHT
6 series · 6 of 18 positions shown · non-contrast
Comparison: Previous exam(s).

CLINICAL DATA: Screening recall for a right breast mass.

EXAM:
DIGITAL DIAGNOSTIC RIGHT MAMMOGRAM WITH 3D TOMOSYNTHESIS AND CAD

[R MLO]
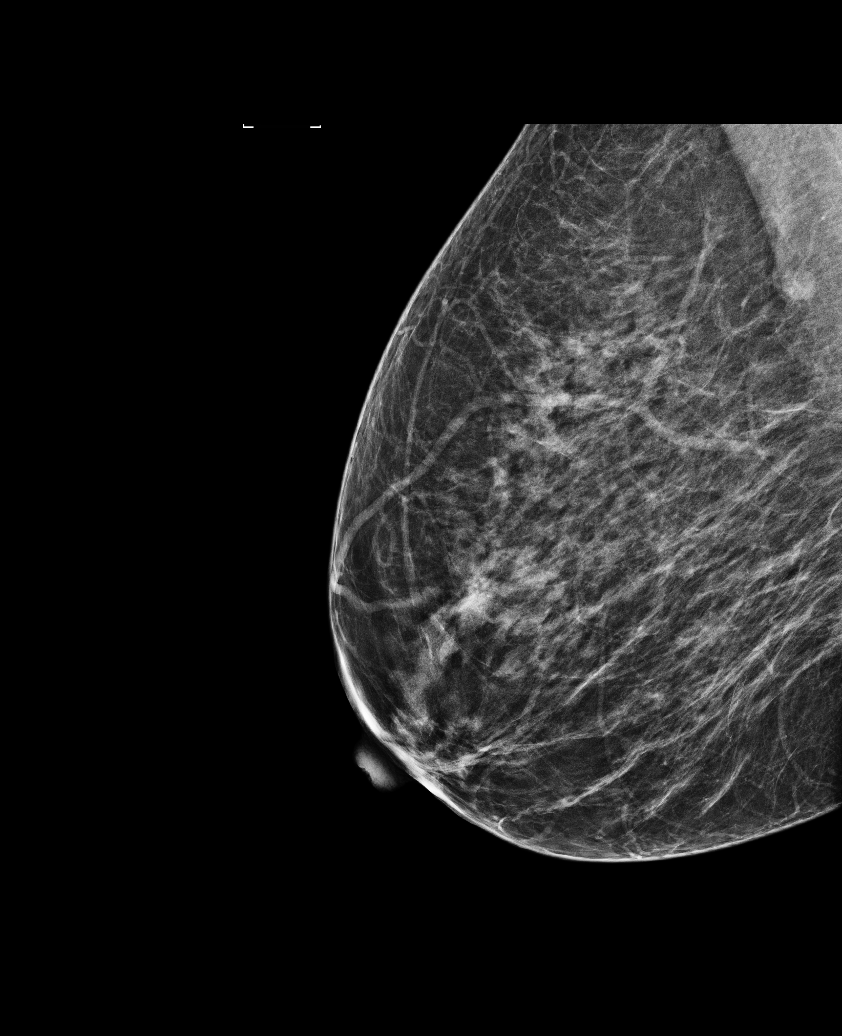

[R ML]
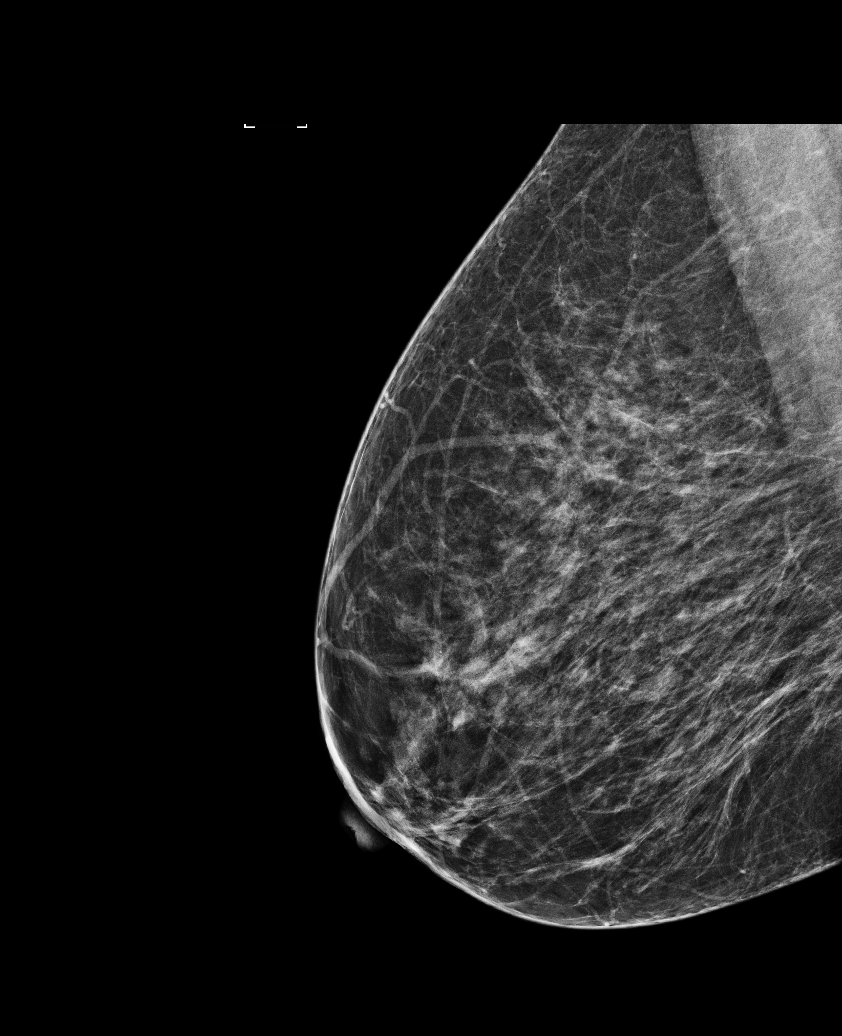

[R XCCL]
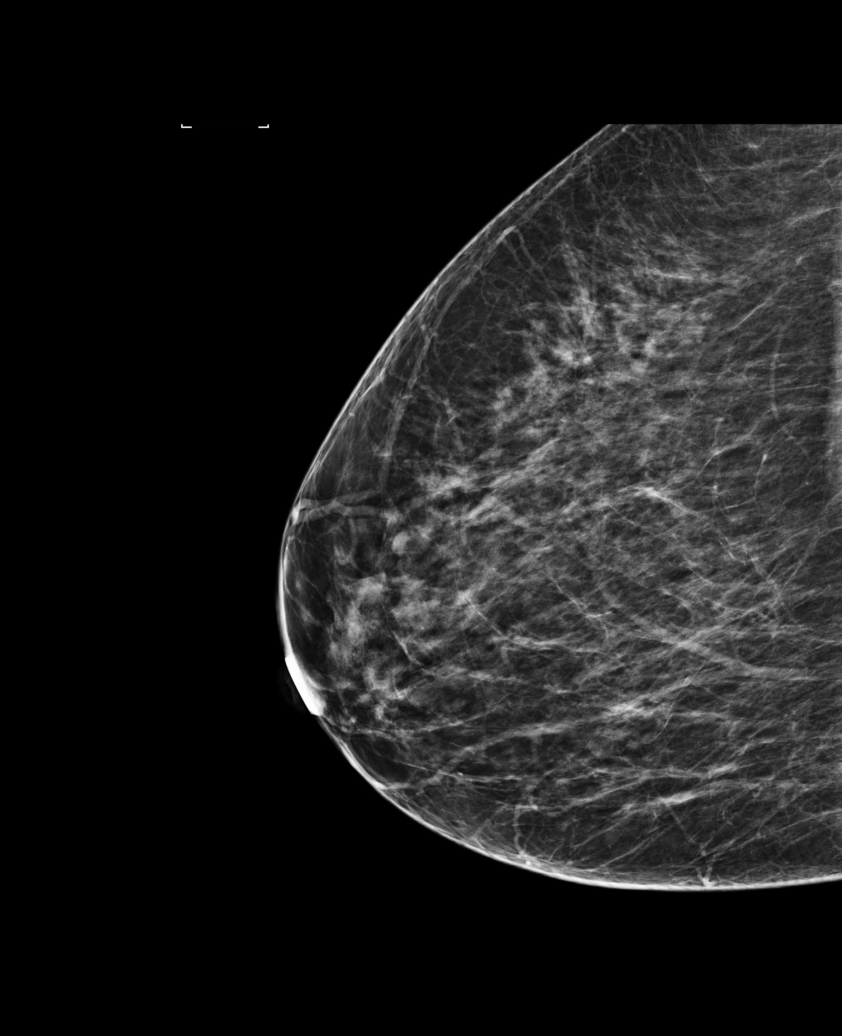

[R ML tomo · tomo slice 31/61.0]
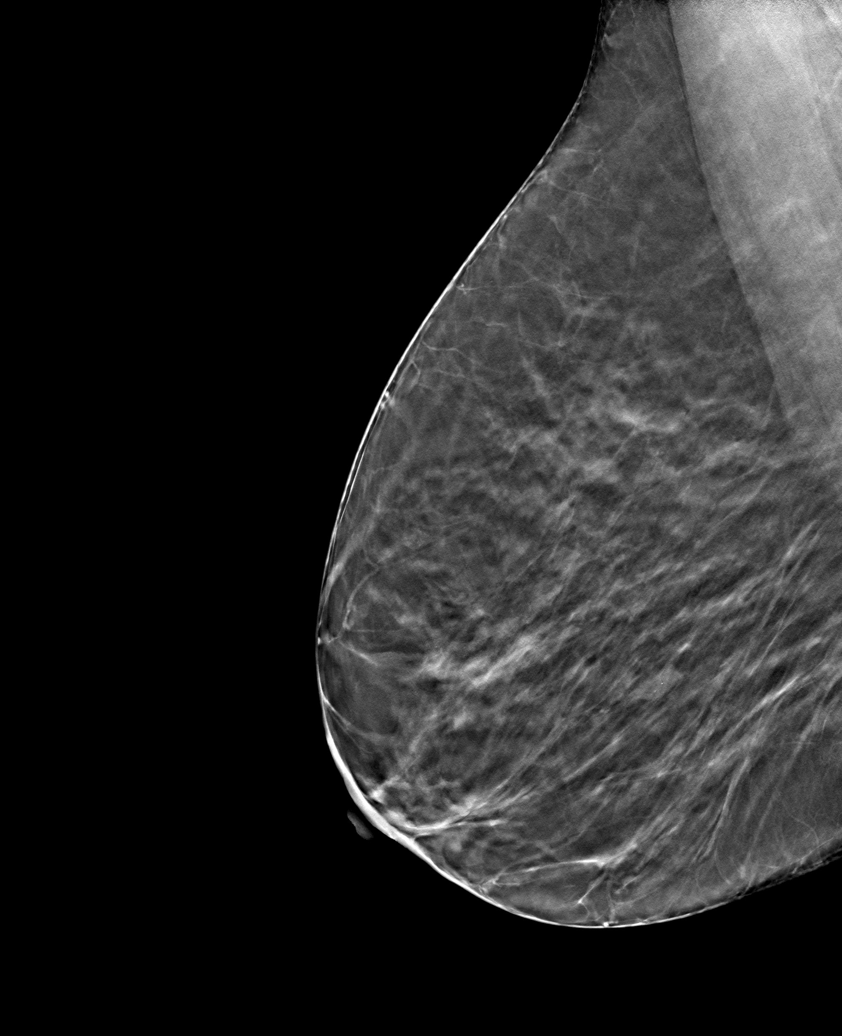

[R MLO tomo · tomo slice 31/62.0]
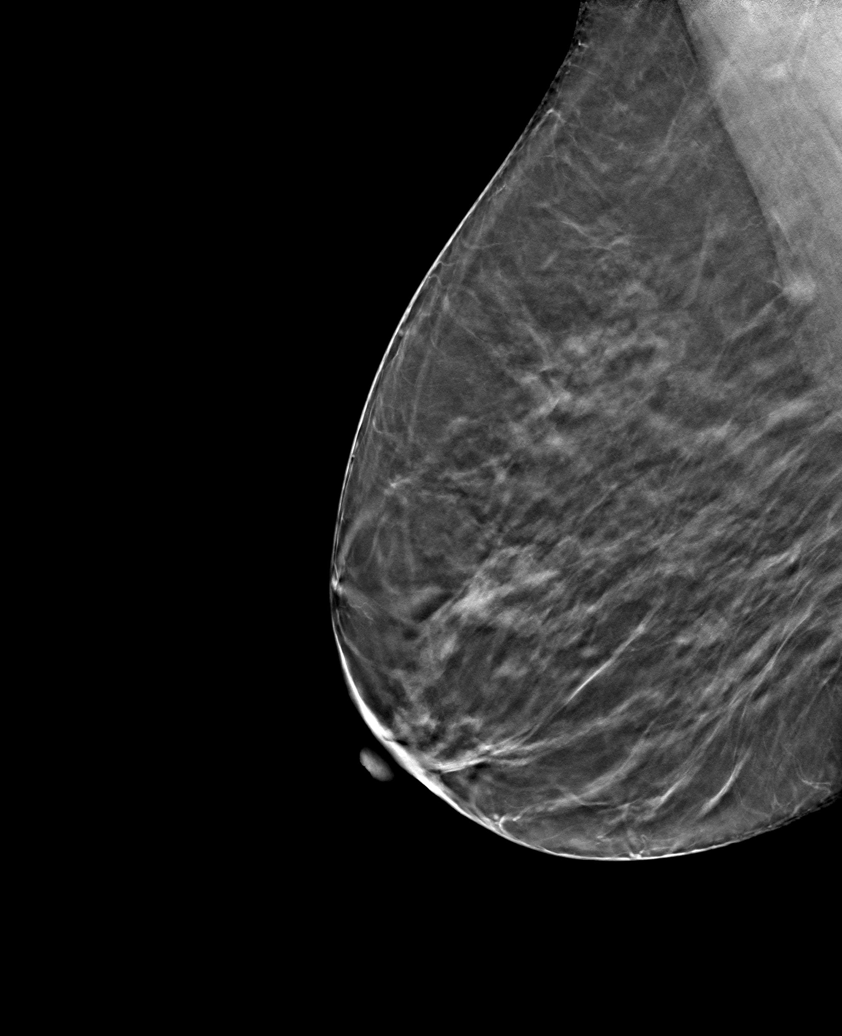

[R XCCL tomo · tomo slice 29/58.0]
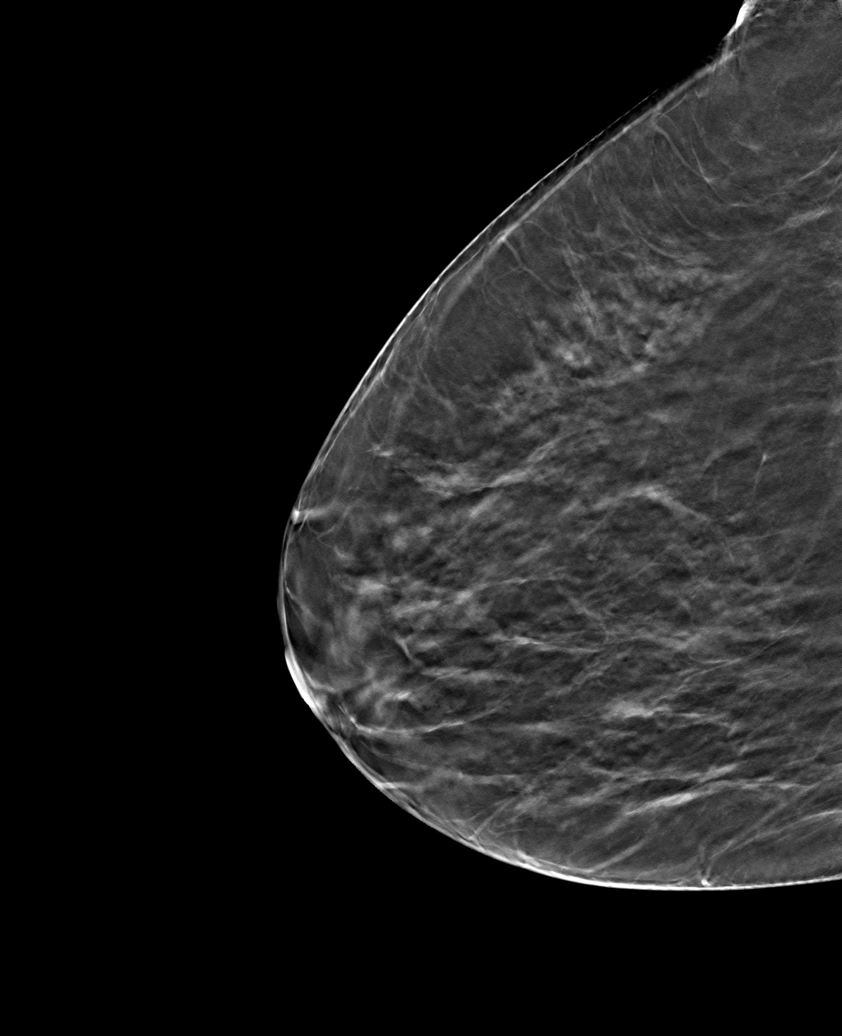

[6 of 18 positions shown; findings below may reference images not displayed]

ACR Breast Density Category c: The breast tissue is heterogeneously
dense, which may obscure small masses.
FINDINGS: CC, MLO, and ML tomosynthesis was performed of the right breast.
Initially questioned possible mass seen in the superior far
posterior right breast on the MLO view only persists on the
additional imaging and appears to contain internal fat density
suggestive of an intramammary lymph node.

Mammographic images were processed with CAD.

Physical examination of the outer right breast does not reveal any
palpable masses.

Targeted ultrasound of the right breast was performed demonstrating
an intramammary lymph node of normal morphology containing an
echogenic/fatty hila at the [DATE] position 12 cm from the nipple
measuring 0.3 x 0.9 cm. This corresponds with mammography findings.
IMPRESSION: Benign appearing intramammary lymph node in the right breast. There
is no mammographic evidence of malignancy in the right breast.

RECOMMENDATION:
Screening mammogram in one year.(Code:[C0])

I have discussed the findings and recommendations with the patient.
Results were also provided in writing at the conclusion of the
visit. If applicable, a reminder letter will be sent to the patient
regarding the next appointment.

BI-RADS CATEGORY  2: Benign.

## 2015-05-15 IMAGING — US US BREAST LTD UNI RIGHT INC AXILLA
1 series · 5 of 5 positions shown · non-contrast
Comparison: Previous exam(s).

CLINICAL DATA: Screening recall for a right breast mass.

EXAM:
DIGITAL DIAGNOSTIC RIGHT MAMMOGRAM WITH 3D TOMOSYNTHESIS AND CAD

[Series 1: us breast ltd uni right inc axilla · 0.05mm/px · 5 of 5 slices shown]
[im 1/5]
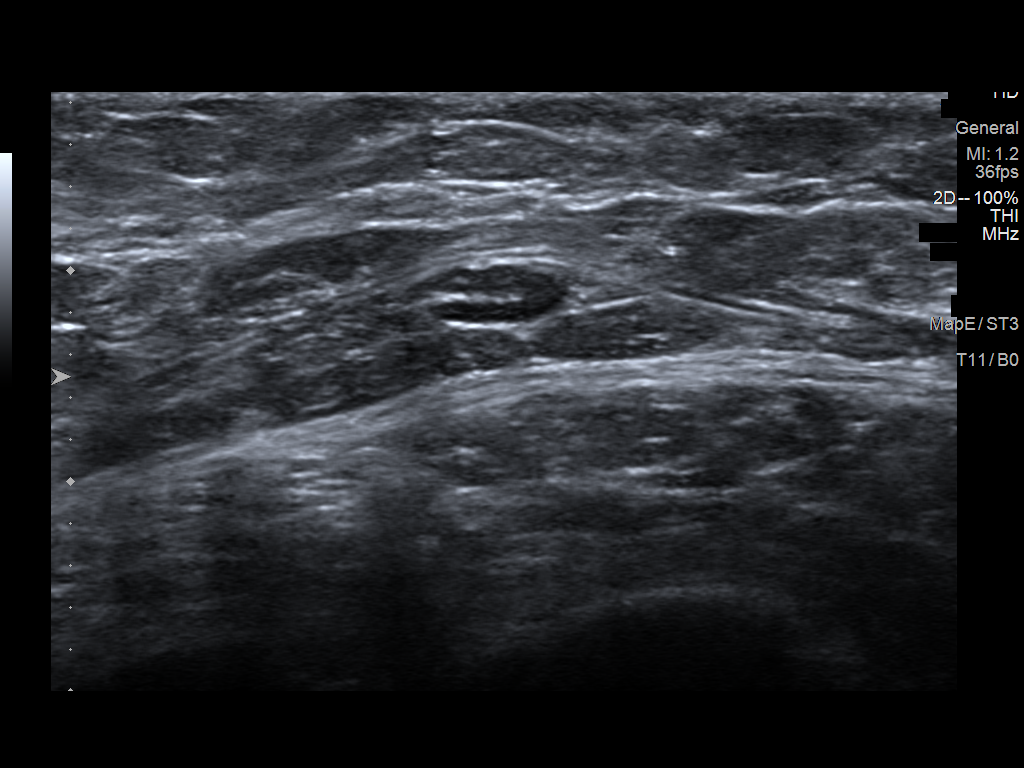
[im 2/5]
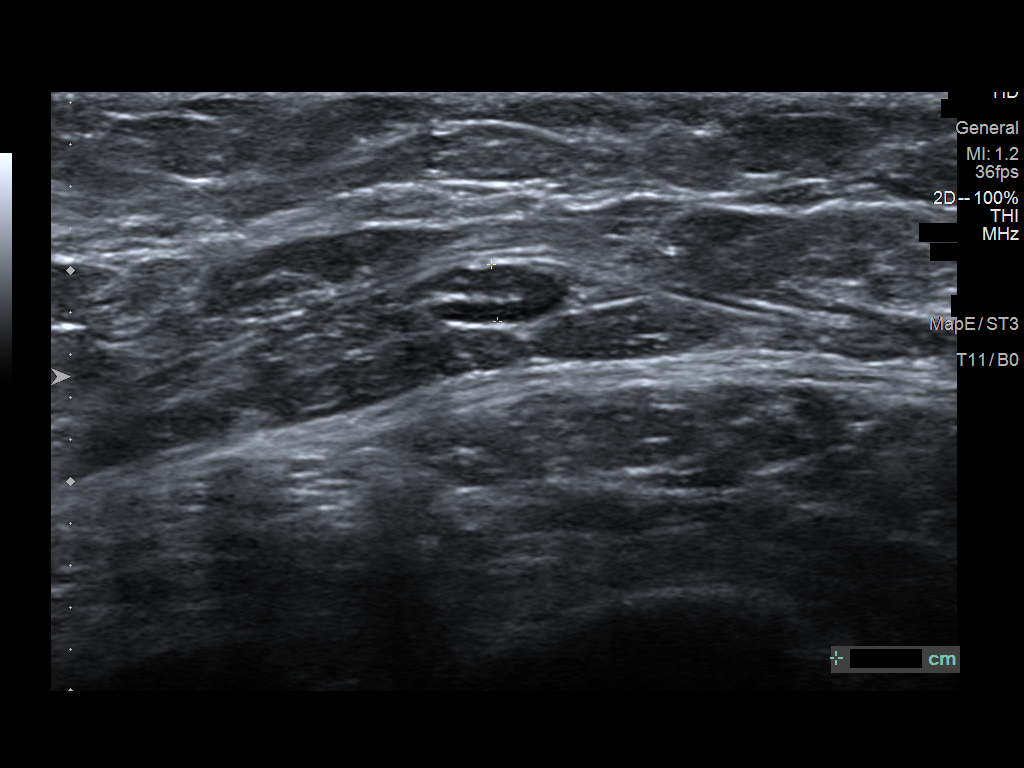
[im 3/5]
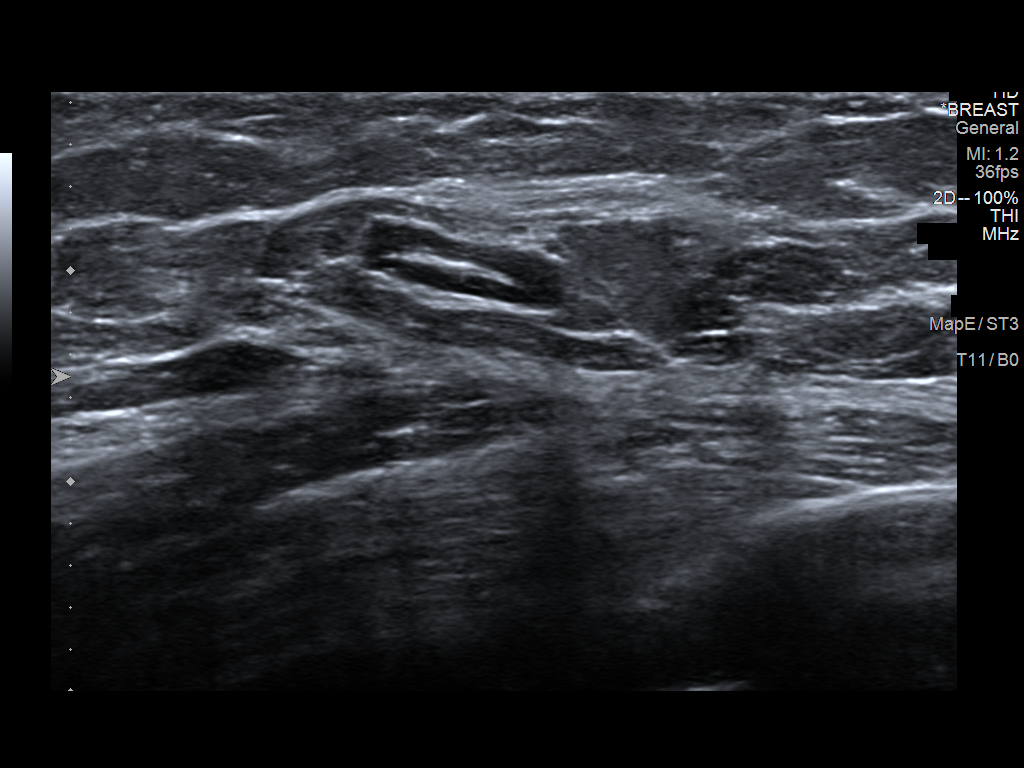
[im 4/5]
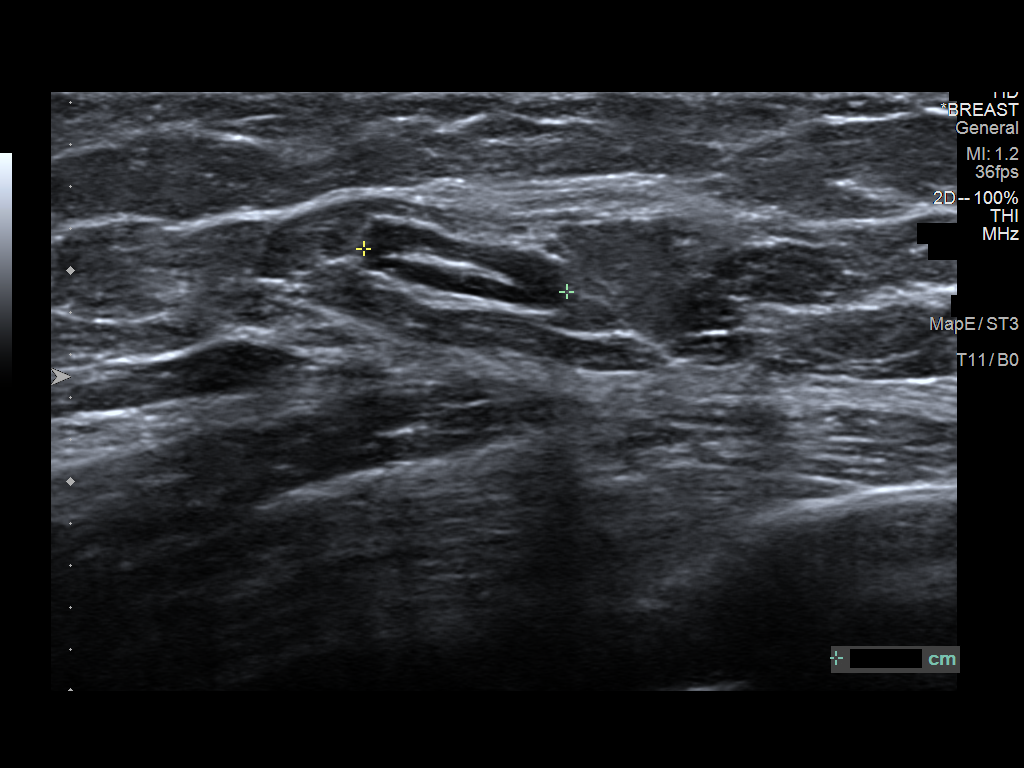
[im 5/5]
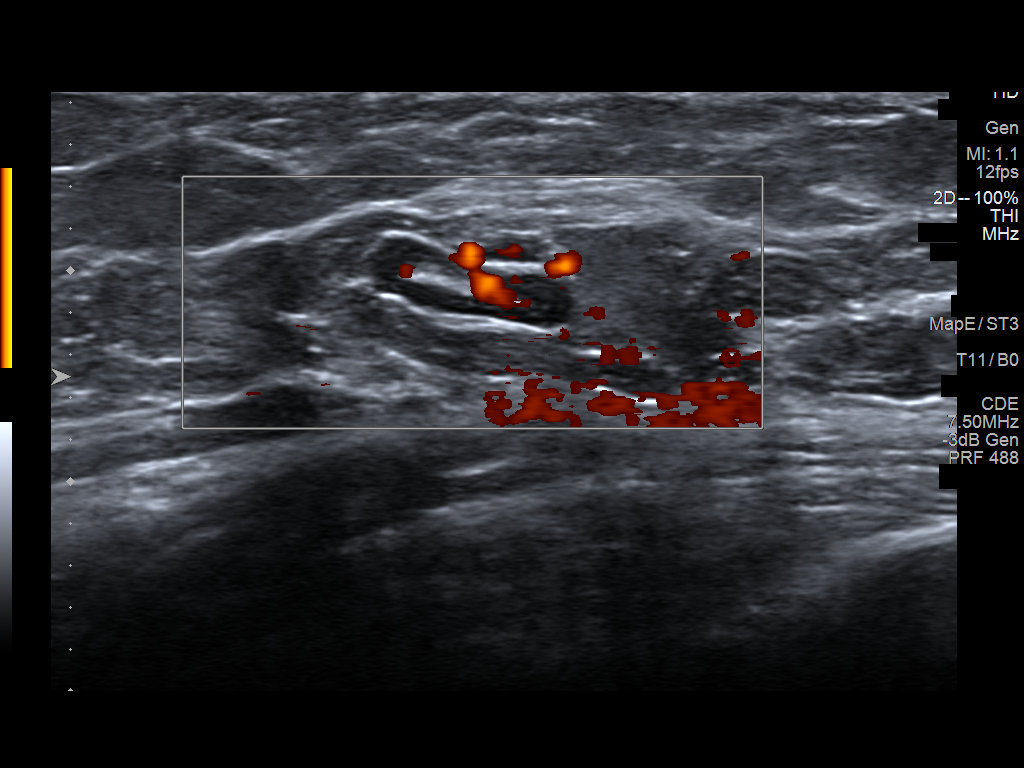

[5 of 5 positions shown; findings below may reference images not displayed]

ACR Breast Density Category c: The breast tissue is heterogeneously
dense, which may obscure small masses.
FINDINGS: CC, MLO, and ML tomosynthesis was performed of the right breast.
Initially questioned possible mass seen in the superior far
posterior right breast on the MLO view only persists on the
additional imaging and appears to contain internal fat density
suggestive of an intramammary lymph node.

Mammographic images were processed with CAD.

Physical examination of the outer right breast does not reveal any
palpable masses.

Targeted ultrasound of the right breast was performed demonstrating
an intramammary lymph node of normal morphology containing an
echogenic/fatty hila at the [DATE] position 12 cm from the nipple
measuring 0.3 x 0.9 cm. This corresponds with mammography findings.
IMPRESSION: Benign appearing intramammary lymph node in the right breast. There
is no mammographic evidence of malignancy in the right breast.

RECOMMENDATION:
Screening mammogram in one year.(Code:[C0])

I have discussed the findings and recommendations with the patient.
Results were also provided in writing at the conclusion of the
visit. If applicable, a reminder letter will be sent to the patient
regarding the next appointment.

BI-RADS CATEGORY  2: Benign.

## 2015-06-30 ENCOUNTER — Other Ambulatory Visit: Payer: Self-pay | Admitting: Internal Medicine

## 2015-06-30 ENCOUNTER — Other Ambulatory Visit (INDEPENDENT_AMBULATORY_CARE_PROVIDER_SITE_OTHER): Payer: BLUE CROSS/BLUE SHIELD

## 2015-06-30 ENCOUNTER — Ambulatory Visit (INDEPENDENT_AMBULATORY_CARE_PROVIDER_SITE_OTHER): Payer: BLUE CROSS/BLUE SHIELD | Admitting: Internal Medicine

## 2015-06-30 ENCOUNTER — Encounter: Payer: Self-pay | Admitting: Internal Medicine

## 2015-06-30 VITALS — BP 128/78 | HR 90 | Temp 97.1°F | Resp 16 | Ht 67.0 in | Wt 182.0 lb

## 2015-06-30 DIAGNOSIS — Z23 Encounter for immunization: Secondary | ICD-10-CM

## 2015-06-30 DIAGNOSIS — Z0000 Encounter for general adult medical examination without abnormal findings: Secondary | ICD-10-CM | POA: Diagnosis not present

## 2015-06-30 DIAGNOSIS — R7303 Prediabetes: Secondary | ICD-10-CM

## 2015-06-30 DIAGNOSIS — B180 Chronic viral hepatitis B with delta-agent: Secondary | ICD-10-CM

## 2015-06-30 DIAGNOSIS — L298 Other pruritus: Secondary | ICD-10-CM | POA: Diagnosis not present

## 2015-06-30 DIAGNOSIS — N898 Other specified noninflammatory disorders of vagina: Secondary | ICD-10-CM | POA: Insufficient documentation

## 2015-06-30 LAB — CBC WITH DIFFERENTIAL/PLATELET
Basophils Absolute: 0 10*3/uL (ref 0.0–0.1)
Basophils Relative: 0.9 % (ref 0.0–3.0)
EOS ABS: 0.1 10*3/uL (ref 0.0–0.7)
EOS PCT: 1.4 % (ref 0.0–5.0)
HEMATOCRIT: 39.3 % (ref 36.0–46.0)
HEMOGLOBIN: 12.9 g/dL (ref 12.0–15.0)
Lymphocytes Relative: 34.3 % (ref 12.0–46.0)
Lymphs Abs: 1.8 10*3/uL (ref 0.7–4.0)
MCHC: 33 g/dL (ref 30.0–36.0)
MCV: 80.3 fl (ref 78.0–100.0)
MONO ABS: 0.8 10*3/uL (ref 0.1–1.0)
MONOS PCT: 15.5 % — AB (ref 3.0–12.0)
Neutro Abs: 2.4 10*3/uL (ref 1.4–7.7)
Neutrophils Relative %: 47.9 % (ref 43.0–77.0)
Platelets: 280 10*3/uL (ref 150.0–400.0)
RBC: 4.89 Mil/uL (ref 3.87–5.11)
RDW: 14.7 % (ref 11.5–15.5)
WBC: 5.1 10*3/uL (ref 4.0–10.5)

## 2015-06-30 LAB — HEPATITIS B CORE ANTIBODY, TOTAL: Hep B Core Total Ab: REACTIVE — AB

## 2015-06-30 LAB — HEPATITIS A ANTIBODY, TOTAL: HEP A TOTAL AB: REACTIVE — AB

## 2015-06-30 LAB — LIPID PANEL
CHOL/HDL RATIO: 4
CHOLESTEROL: 201 mg/dL — AB (ref 0–200)
HDL: 53.2 mg/dL (ref >39.00)
LDL CALC: 136 mg/dL — AB (ref 0–99)
NONHDL: 147.97
Triglycerides: 58 mg/dL (ref 0.0–149.0)
VLDL: 11.6 mg/dL (ref 0.0–40.0)

## 2015-06-30 LAB — COMPREHENSIVE METABOLIC PANEL
ALBUMIN: 4.1 g/dL (ref 3.5–5.2)
ALK PHOS: 63 U/L (ref 39–117)
ALT: 17 U/L (ref 0–35)
AST: 23 U/L (ref 0–37)
BUN: 14 mg/dL (ref 6–23)
CO2: 25 mEq/L (ref 19–32)
Calcium: 9.5 mg/dL (ref 8.4–10.5)
Chloride: 105 mEq/L (ref 96–112)
Creatinine, Ser: 0.95 mg/dL (ref 0.40–1.20)
GFR: 67.15 mL/min (ref >60.00)
Glucose, Bld: 80 mg/dL (ref 70–99)
POTASSIUM: 3.7 meq/L (ref 3.5–5.1)
SODIUM: 137 meq/L (ref 135–145)
TOTAL PROTEIN: 7.8 g/dL (ref 6.0–8.3)
Total Bilirubin: 0.3 mg/dL (ref 0.2–1.2)

## 2015-06-30 LAB — TSH: TSH: 1.43 u[IU]/mL (ref 0.35–4.50)

## 2015-06-30 LAB — HEPATITIS B SURFACE ANTIBODY,QUALITATIVE: HEP B S AB: NEGATIVE

## 2015-06-30 LAB — HEPATITIS C ANTIBODY: HCV AB: NEGATIVE

## 2015-06-30 LAB — HEMOGLOBIN A1C: HEMOGLOBIN A1C: 5.7 % (ref 4.6–6.5)

## 2015-06-30 MED ORDER — SALINE SPRAY 0.65 % NA SOLN: 2.0000 | NASAL | Status: DC | PRN

## 2015-06-30 NOTE — Progress Notes (Signed)
Subjective:  Patient ID: [name], female    DOB: 1971-01-30  Age: 44 y.o. MRN: [id number]  CC: Annual Exam   HPI Jaelene Weckwerth presents for a CPX - she complains of persistent vaginal itching and needs a f/up with GYN.  Outpatient Prescriptions Prior to Visit  Medication Sig Dispense Refill  . acetaminophen (TYLENOL) 325 MG tablet Take 325 mg by mouth daily as needed for moderate pain. For pain    . albuterol (PROVENTIL HFA;VENTOLIN HFA) 108 (90 BASE) MCG/ACT inhaler Inhale 2 puffs into the lungs daily as needed for shortness of breath. For shortness of breath or wheezing 1 Inhaler 5  . fluticasone (FLONASE) 50 MCG/ACT nasal spray Place 2 sprays into the nose 2 (two) times a week.    . rivaroxaban (XARELTO) 20 MG TABS tablet Take 1 tablet (20 mg total) by mouth daily. TAKE 1 TABLET (20 MG TOTAL) BY MOUTH EVERY MORNING. 30 tablet 11  . HYDROcodone-acetaminophen (NORCO/VICODIN) 5-325 MG per tablet Take 1 tablet by mouth every 6 (six) hours as needed for moderate pain. 15 tablet 0  . LORazepam (ATIVAN) 1 MG tablet Take 1 tablet (1 mg total) by mouth every 4 (four) hours as needed for anxiety. 15 tablet 0  . metroNIDAZOLE (FLAGYL) 500 MG tablet Take 1 tablet (500 mg total) by mouth 2 (two) times daily. 14 tablet 0  . Multiple Vitamin (MULTIVITAMIN WITH MINERALS) TABS Take 1 tablet by mouth daily. Centrum     No facility-administered medications prior to visit.    ROS Review of Systems  Constitutional: Negative.  Negative for fever, chills, diaphoresis, appetite change and fatigue.  HENT: Negative.   Eyes: Negative.   Respiratory: Negative.  Negative for cough, choking, chest tightness, shortness of breath and stridor.   Cardiovascular: Negative.  Negative for chest pain, palpitations and leg swelling.  Gastrointestinal: Negative.  Negative for nausea, vomiting, abdominal pain, diarrhea, constipation and blood in stool.  Endocrine: Negative.  Negative for polyphagia and  polyuria.  Genitourinary: Negative.  Negative for dysuria, urgency, frequency, flank pain, vaginal bleeding, vaginal discharge, difficulty urinating, vaginal pain and pelvic pain.  Musculoskeletal: Negative.   Skin: Negative.   Allergic/Immunologic: Negative.   Neurological: Negative.   Hematological: Negative.  Negative for adenopathy. Does not bruise/bleed easily.  Psychiatric/Behavioral: Negative.     Objective:  BP 128/78 mmHg  Pulse 90  Temp(Src) 97.1 F (36.2 C) (Oral)  Ht 5' 7"  (1.702 m)  Wt 182 lb (82.555 kg)  BMI 28.50 kg/m2  SpO2 98%  BP Readings from Last 3 Encounters:  06/30/15 128/78  01/17/15 116/79  01/16/15 115/72    Wt Readings from Last 3 Encounters:  06/30/15 182 lb (82.555 kg)  01/17/15 165 lb 4.8 oz (74.98 kg)  01/16/15 170 lb (77.111 kg)    Physical Exam  Constitutional: She is oriented to person, place, and time. She appears well-developed and well-nourished. No distress.  HENT:  Mouth/Throat: Oropharynx is clear and moist. No oropharyngeal exudate.  Eyes: Conjunctivae are normal. Right eye exhibits no discharge. Left eye exhibits no discharge. No scleral icterus.  Neck: Normal range of motion. Neck supple. No JVD present. No tracheal deviation present. No thyromegaly present.  Cardiovascular: Normal rate, regular rhythm, normal heart sounds and intact distal pulses.  Exam reveals no gallop and no friction rub.   No murmur heard. Pulmonary/Chest: Effort normal and breath sounds normal. No stridor. No respiratory distress. She has no wheezes. She has no rales. She exhibits no tenderness.  Abdominal:  Soft. Bowel sounds are normal. She exhibits no distension and no mass. There is no tenderness. There is no rebound and no guarding.  Musculoskeletal: Normal range of motion. She exhibits no edema or tenderness.  Lymphadenopathy:    She has no cervical adenopathy.  Neurological: She is oriented to person, place, and time.  Skin: Skin is warm and dry. No  rash noted. She is not diaphoretic. No erythema. No pallor.  Vitals reviewed.   Lab Results  Component Value Date   WBC 5.1 06/30/2015   HGB 12.9 06/30/2015   HCT 39.3 06/30/2015   PLT 280.0 06/30/2015   GLUCOSE 80 06/30/2015   CHOL 201* 06/30/2015   TRIG 58.0 06/30/2015   HDL 53.20 06/30/2015   LDLCALC 136* 06/30/2015   ALT 17 06/30/2015   AST 23 06/30/2015   NA 137 06/30/2015   K 3.7 06/30/2015   CL 105 06/30/2015   CREATININE 0.95 06/30/2015   BUN 14 06/30/2015   CO2 25 06/30/2015   TSH 1.43 06/30/2015   INR 2.80 03/20/2012   HGBA1C 5.7 06/30/2015    US Breast [place] Axilla  05/15/2015  CLINICAL DATA:  Screening recall for a right breast mass. EXAM: DIGITAL DIAGNOSTIC RIGHT MAMMOGRAM WITH 3D TOMOSYNTHESIS AND CAD COMPARISON:  Previous exam(s). ACR Breast Density Category c: The breast tissue is heterogeneously dense, which may obscure small masses. FINDINGS: CC, MLO, and ML tomosynthesis was performed of the right breast. Initially questioned possible mass seen in the superior far posterior right breast on the MLO view only persists on the additional imaging and appears to contain internal fat density suggestive of an intramammary lymph node. Mammographic images were processed with CAD. Physical examination of the outer right breast does not reveal any palpable masses. Targeted ultrasound of the right breast was performed demonstrating an intramammary lymph node of normal morphology containing an echogenic/fatty hila at the 9:30 position 12 cm from the nipple measuring 0.3 x 0.9 cm. This corresponds with mammography findings. IMPRESSION: Benign appearing intramammary lymph node in the right breast. There is no mammographic evidence of malignancy in the right breast. RECOMMENDATION: Screening mammogram in one year.(Code:SM-B-01Y) I have discussed the findings and recommendations with the patient. Results were also provided in writing at the conclusion of the visit. If  applicable, a reminder letter will be sent to the patient regarding the next appointment. BI-RADS CATEGORY  2: Benign. Electronically Signed   By: Everlean Alstrom M.D.   On: 05/15/2015 13:49   Mm Diag Breast Tomo Uni Right  05/15/2015  CLINICAL DATA:  Screening recall for a right breast mass. EXAM: DIGITAL DIAGNOSTIC RIGHT MAMMOGRAM WITH 3D TOMOSYNTHESIS AND CAD COMPARISON:  Previous exam(s). ACR Breast Density Category c: The breast tissue is heterogeneously dense, which may obscure small masses. FINDINGS: CC, MLO, and ML tomosynthesis was performed of the right breast. Initially questioned possible mass seen in the superior far posterior right breast on the MLO view only persists on the additional imaging and appears to contain internal fat density suggestive of an intramammary lymph node. Mammographic images were processed with CAD. Physical examination of the outer right breast does not reveal any palpable masses. Targeted ultrasound of the right breast was performed demonstrating an intramammary lymph node of normal morphology containing an echogenic/fatty hila at the 9:30 position 12 cm from the nipple measuring 0.3 x 0.9 cm. This corresponds with mammography findings. IMPRESSION: Benign appearing intramammary lymph node in the right breast. There is no mammographic evidence of malignancy  in the right breast. RECOMMENDATION: Screening mammogram in one year.(Code:SM-B-01Y) I have discussed the findings and recommendations with the patient. Results were also provided in writing at the conclusion of the visit. If applicable, a reminder letter will be sent to the patient regarding the next appointment. BI-RADS CATEGORY  2: Benign. Electronically Signed   By: Everlean Alstrom M.D.   On: 05/15/2015 13:49    Assessment & Plan:   Meggin was seen today for annual exam.  Diagnoses and all orders for this visit:  Encounter for immunization  Chronic viral hepatitis B without coma and with delta agent  ([place])- there is no recent info about this infection. Her LFT's are normal. I will check a Hep DNA - PCR to see if there is active infection, will also check for Hep A exposure and for Hep C infection. -     Hepatitis A antibody, total; Future -     Hepatitis B core antibody, total; Future -     Hepatitis B surface antibody; Future -     Hepatitis C antibody; Future -     Hepatitis B DNA, ultraquantitative, PCR; Future  Preventative health care- exam done, vaccines were reviewed and updated, Mammogram and PAP are UTD, labs ordered and wil be reviewed. -     Lipid panel; Future -     Comprehensive metabolic panel; Future -     CBC with Differential/Platelet; Future -     TSH; Future  Prediabetes- her blood sugars are well controlled, no meds are needed at this time -     Hemoglobin A1c; Future  Vaginal itching -     Ambulatory referral to Gynecology  Other orders -     sodium chloride (OCEAN) 0.65 % SOLN nasal spray; Place 2 sprays into the nose as needed for congestion. For dry nose -     Flu Vaccine QUAD 36+ mos IM   I have discontinued Ms. Bohnet's multivitamin with minerals, LORazepam, HYDROcodone-acetaminophen, and metroNIDAZOLE. I have also changed her sodium chloride. Additionally, I am having her maintain her acetaminophen, fluticasone, rivaroxaban, and albuterol.  Meds ordered this encounter  Medications  . sodium chloride (OCEAN) 0.65 % SOLN nasal spray    Sig: Place 2 sprays into the nose as needed for congestion. For dry nose    Dispense:  15 mL    Refill:  3     Follow-up: Return in about 3 months (around 09/30/2015).  Scarlette Calico, MD

## 2015-06-30 NOTE — Patient Instructions (Signed)
Preventive Care for Adults, Female A healthy lifestyle and preventive care can promote health and wellness. Preventive health guidelines for women include the following key practices.  A routine yearly physical is a good way to check with your health care provider about your health and preventive screening. It is a chance to share any concerns and updates on your health and to receive a thorough exam.  Visit your dentist for a routine exam and preventive care every 6 months. Brush your teeth twice a day and floss once a day. Good oral hygiene prevents tooth decay and gum disease.  The frequency of eye exams is based on your age, health, family medical history, use of contact lenses, and other factors. Follow your health care provider's recommendations for frequency of eye exams.  Eat a healthy diet. Foods like vegetables, fruits, whole grains, low-fat dairy products, and lean protein foods contain the nutrients you need without too many calories. Decrease your intake of foods high in solid fats, added sugars, and salt. Eat the right amount of calories for you.Get information about a proper diet from your health care provider, if necessary.  Regular physical exercise is one of the most important things you can do for your health. Most adults should get at least 150 minutes of moderate-intensity exercise (any activity that increases your heart rate and causes you to sweat) each week. In addition, most adults need muscle-strengthening exercises on 2 or more days a week.  Maintain a healthy weight. The body mass index (BMI) is a screening tool to identify possible weight problems. It provides an estimate of body fat based on height and weight. Your health care provider can find your BMI and can help you achieve or maintain a healthy weight.For adults 20 years and older:  A BMI below 18.5 is considered underweight.  A BMI of 18.5 to 24.9 is normal.  A BMI of 25 to 29.9 is considered overweight.  A  BMI of 30 and above is considered obese.  Maintain normal blood lipids and cholesterol levels by exercising and minimizing your intake of saturated fat. Eat a balanced diet with plenty of fruit and vegetables. Blood tests for lipids and cholesterol should begin at age 45 and be repeated every 5 years. If your lipid or cholesterol levels are high, you are over 50, or you are at high risk for heart disease, you may need your cholesterol levels checked more frequently.Ongoing high lipid and cholesterol levels should be treated with medicines if diet and exercise are not working.  If you smoke, find out from your health care provider how to quit. If you do not use tobacco, do not start.  Lung cancer screening is recommended for adults aged 45-80 years who are at high risk for developing lung cancer because of a history of smoking. A yearly low-dose CT scan of the lungs is recommended for people who have at least a 30-pack-year history of smoking and are a current smoker or have quit within the past 15 years. A pack year of smoking is smoking an average of 1 pack of cigarettes a day for 1 year (for example: 1 pack a day for 30 years or 2 packs a day for 15 years). Yearly screening should continue until the smoker has stopped smoking for at least 15 years. Yearly screening should be stopped for people who develop a health problem that would prevent them from having lung cancer treatment.  If you are pregnant, do not drink alcohol. If you are  breastfeeding, be very cautious about drinking alcohol. If you are not pregnant and choose to drink alcohol, do not have more than 1 drink per day. One drink is considered to be 12 ounces (355 mL) of beer, 5 ounces (148 mL) of wine, or 1.5 ounces (44 mL) of liquor.  Avoid use of street drugs. Do not share needles with anyone. Ask for help if you need support or instructions about stopping the use of drugs.  High blood pressure causes heart disease and increases the risk  of stroke. Your blood pressure should be checked at least every 1 to 2 years. Ongoing high blood pressure should be treated with medicines if weight loss and exercise do not work.  If you are 55-79 years old, ask your health care provider if you should take aspirin to prevent strokes.  Diabetes screening is done by taking a blood sample to check your blood glucose level after you have not eaten for a certain period of time (fasting). If you are not overweight and you do not have risk factors for diabetes, you should be screened once every 3 years starting at age 45. If you are overweight or obese and you are 40-70 years of age, you should be screened for diabetes every year as part of your cardiovascular risk assessment.  Breast cancer screening is essential preventive care for women. You should practice "breast self-awareness." This means understanding the normal appearance and feel of your breasts and may include breast self-examination. Any changes detected, no matter how small, should be reported to a health care provider. Women in their 20s and 30s should have a clinical breast exam (CBE) by a health care provider as part of a regular health exam every 1 to 3 years. After age 40, women should have a CBE every year. Starting at age 40, women should consider having a mammogram (breast X-ray test) every year. Women who have a family history of breast cancer should talk to their health care provider about genetic screening. Women at a high risk of breast cancer should talk to their health care providers about having an MRI and a mammogram every year.  Breast cancer gene (BRCA)-related cancer risk assessment is recommended for women who have family members with BRCA-related cancers. BRCA-related cancers include breast, ovarian, tubal, and peritoneal cancers. Having family members with these cancers may be associated with an increased risk for harmful changes (mutations) in the breast cancer genes BRCA1 and  BRCA2. Results of the assessment will determine the need for genetic counseling and BRCA1 and BRCA2 testing.  Your health care provider may recommend that you be screened regularly for cancer of the pelvic organs (ovaries, uterus, and vagina). This screening involves a pelvic examination, including checking for microscopic changes to the surface of your cervix (Pap test). You may be encouraged to have this screening done every 3 years, beginning at age 21.  For women ages 30-65, health care providers may recommend pelvic exams and Pap testing every 3 years, or they may recommend the Pap and pelvic exam, combined with testing for human papilloma virus (HPV), every 5 years. Some types of HPV increase your risk of cervical cancer. Testing for HPV may also be done on women of any age with unclear Pap test results.  Other health care providers may not recommend any screening for nonpregnant women who are considered low risk for pelvic cancer and who do not have symptoms. Ask your health care provider if a screening pelvic exam is right for   you.  If you have had past treatment for cervical cancer or a condition that could lead to cancer, you need Pap tests and screening for cancer for at least 20 years after your treatment. If Pap tests have been discontinued, your risk factors (such as having a new sexual partner) need to be reassessed to determine if screening should resume. Some women have medical problems that increase the chance of getting cervical cancer. In these cases, your health care provider may recommend more frequent screening and Pap tests.  Colorectal cancer can be detected and often prevented. Most routine colorectal cancer screening begins at the age of 50 years and continues through age 75 years. However, your health care provider may recommend screening at an earlier age if you have risk factors for colon cancer. On a yearly basis, your health care provider may provide home test kits to check  for hidden blood in the stool. Use of a small camera at the end of a tube, to directly examine the colon (sigmoidoscopy or colonoscopy), can detect the earliest forms of colorectal cancer. Talk to your health care provider about this at age 50, when routine screening begins. Direct exam of the colon should be repeated every 5-10 years through age 75 years, unless early forms of precancerous polyps or small growths are found.  People who are at an increased risk for hepatitis B should be screened for this virus. You are considered at high risk for hepatitis B if:  You were born in a country where hepatitis B occurs often. Talk with your health care provider about which countries are considered high risk.  Your parents were born in a high-risk country and you have not received a shot to protect against hepatitis B (hepatitis B vaccine).  You have HIV or AIDS.  You use needles to inject street drugs.  You live with, or have sex with, someone who has hepatitis B.  You get hemodialysis treatment.  You take certain medicines for conditions like cancer, organ transplantation, and autoimmune conditions.  Hepatitis C blood testing is recommended for all people born from 1945 through 1965 and any individual with known risks for hepatitis C.  Practice safe sex. Use condoms and avoid high-risk sexual practices to reduce the spread of sexually transmitted infections (STIs). STIs include gonorrhea, chlamydia, syphilis, trichomonas, herpes, HPV, and human immunodeficiency virus (HIV). Herpes, HIV, and HPV are viral illnesses that have no cure. They can result in disability, cancer, and death.  You should be screened for sexually transmitted illnesses (STIs) including gonorrhea and chlamydia if:  You are sexually active and are younger than 24 years.  You are older than 24 years and your health care provider tells you that you are at risk for this type of infection.  Your sexual activity has changed  since you were last screened and you are at an increased risk for chlamydia or gonorrhea. Ask your health care provider if you are at risk.  If you are at risk of being infected with HIV, it is recommended that you take a prescription medicine daily to prevent HIV infection. This is called preexposure prophylaxis (PrEP). You are considered at risk if:  You are sexually active and do not regularly use condoms or know the HIV status of your partner(s).  You take drugs by injection.  You are sexually active with a partner who has HIV.  Talk with your health care provider about whether you are at high risk of being infected with HIV. If   you choose to begin PrEP, you should first be tested for HIV. You should then be tested every 3 months for as long as you are taking PrEP.  Osteoporosis is a disease in which the bones lose minerals and strength with aging. This can result in serious bone fractures or breaks. The risk of osteoporosis can be identified using a bone density scan. Women ages 67 years and over and women at risk for fractures or osteoporosis should discuss screening with their health care providers. Ask your health care provider whether you should take a calcium supplement or vitamin D to reduce the rate of osteoporosis.  Menopause can be associated with physical symptoms and risks. Hormone replacement therapy is available to decrease symptoms and risks. You should talk to your health care provider about whether hormone replacement therapy is right for you.  Use sunscreen. Apply sunscreen liberally and repeatedly throughout the day. You should seek shade when your shadow is shorter than you. Protect yourself by wearing long sleeves, pants, a wide-brimmed hat, and sunglasses year round, whenever you are outdoors.  Once a month, do a whole body skin exam, using a mirror to look at the skin on your back. Tell your health care provider of new moles, moles that have irregular borders, moles that  are larger than a pencil eraser, or moles that have changed in shape or color.  Stay current with required vaccines (immunizations).  Influenza vaccine. All adults should be immunized every year.  Tetanus, diphtheria, and acellular pertussis (Td, Tdap) vaccine. Pregnant women should receive 1 dose of Tdap vaccine during each pregnancy. The dose should be obtained regardless of the length of time since the last dose. Immunization is preferred during the 27th-36th week of gestation. An adult who has not previously received Tdap or who does not know her vaccine status should receive 1 dose of Tdap. This initial dose should be followed by tetanus and diphtheria toxoids (Td) booster doses every 10 years. Adults with an unknown or incomplete history of completing a 3-dose immunization series with Td-containing vaccines should begin or complete a primary immunization series including a Tdap dose. Adults should receive a Td booster every 10 years.  Varicella vaccine. An adult without evidence of immunity to varicella should receive 2 doses or a second dose if she has previously received 1 dose. Pregnant females who do not have evidence of immunity should receive the first dose after pregnancy. This first dose should be obtained before leaving the health care facility. The second dose should be obtained 4-8 weeks after the first dose.  Human papillomavirus (HPV) vaccine. Females aged 13-26 years who have not received the vaccine previously should obtain the 3-dose series. The vaccine is not recommended for use in pregnant females. However, pregnancy testing is not needed before receiving a dose. If a female is found to be pregnant after receiving a dose, no treatment is needed. In that case, the remaining doses should be delayed until after the pregnancy. Immunization is recommended for any person with an immunocompromised condition through the age of 61 years if she did not get any or all doses earlier. During the  3-dose series, the second dose should be obtained 4-8 weeks after the first dose. The third dose should be obtained 24 weeks after the first dose and 16 weeks after the second dose.  Zoster vaccine. One dose is recommended for adults aged 30 years or older unless certain conditions are present.  Measles, mumps, and rubella (MMR) vaccine. Adults born  before 1957 generally are considered immune to measles and mumps. Adults born in 1957 or later should have 1 or more doses of MMR vaccine unless there is a contraindication to the vaccine or there is laboratory evidence of immunity to each of the three diseases. A routine second dose of MMR vaccine should be obtained at least 28 days after the first dose for students attending postsecondary schools, health care workers, or international travelers. People who received inactivated measles vaccine or an unknown type of measles vaccine during 1963-1967 should receive 2 doses of MMR vaccine. People who received inactivated mumps vaccine or an unknown type of mumps vaccine before 1979 and are at high risk for mumps infection should consider immunization with 2 doses of MMR vaccine. For females of childbearing age, rubella immunity should be determined. If there is no evidence of immunity, females who are not pregnant should be vaccinated. If there is no evidence of immunity, females who are pregnant should delay immunization until after pregnancy. Unvaccinated health care workers born before 1957 who lack laboratory evidence of measles, mumps, or rubella immunity or laboratory confirmation of disease should consider measles and mumps immunization with 2 doses of MMR vaccine or rubella immunization with 1 dose of MMR vaccine.  Pneumococcal 13-valent conjugate (PCV13) vaccine. When indicated, a person who is uncertain of his immunization history and has no record of immunization should receive the PCV13 vaccine. All adults 65 years of age and older should receive this  vaccine. An adult aged 19 years or older who has certain medical conditions and has not been previously immunized should receive 1 dose of PCV13 vaccine. This PCV13 should be followed with a dose of pneumococcal polysaccharide (PPSV23) vaccine. Adults who are at high risk for pneumococcal disease should obtain the PPSV23 vaccine at least 8 weeks after the dose of PCV13 vaccine. Adults older than 46 years of age who have normal immune system function should obtain the PPSV23 vaccine dose at least 1 year after the dose of PCV13 vaccine.  Pneumococcal polysaccharide (PPSV23) vaccine. When PCV13 is also indicated, PCV13 should be obtained first. All adults aged 65 years and older should be immunized. An adult younger than age 65 years who has certain medical conditions should be immunized. Any person who resides in a nursing home or long-term care facility should be immunized. An adult smoker should be immunized. People with an immunocompromised condition and certain other conditions should receive both PCV13 and PPSV23 vaccines. People with human immunodeficiency virus (HIV) infection should be immunized as soon as possible after diagnosis. Immunization during chemotherapy or radiation therapy should be avoided. Routine use of PPSV23 vaccine is not recommended for American Indians, [place] Natives, or people younger than 65 years unless there are medical conditions that require PPSV23 vaccine. When indicated, people who have unknown immunization and have no record of immunization should receive PPSV23 vaccine. One-time revaccination 5 years after the first dose of PPSV23 is recommended for people aged 19-64 years who have chronic kidney failure, nephrotic syndrome, asplenia, or immunocompromised conditions. People who received 1-2 doses of PPSV23 before age 65 years should receive another dose of PPSV23 vaccine at age 65 years or later if at least 5 years have passed since the previous dose. Doses of PPSV23 are not  needed for people immunized with PPSV23 at or after age 65 years.  Meningococcal vaccine. Adults with asplenia or persistent complement component deficiencies should receive 2 doses of quadrivalent meningococcal conjugate (MenACWY-D) vaccine. The doses should be obtained   at least 2 months apart. Microbiologists working with certain meningococcal bacteria, [place] recruits, people at risk during an outbreak, and people who travel to or live in countries with a high rate of meningitis should be immunized. A first-year college student up through age 34 years who is living in a residence hall should receive a dose if she did not receive a dose on or after her 16th birthday. Adults who have certain high-risk conditions should receive one or more doses of vaccine.  Hepatitis A vaccine. Adults who wish to be protected from this disease, have certain high-risk conditions, work with hepatitis A-infected animals, work in hepatitis A research labs, or travel to or work in countries with a high rate of hepatitis A should be immunized. Adults who were previously unvaccinated and who anticipate close contact with an international adoptee during the first 60 days after arrival in the [place] from a country with a high rate of hepatitis A should be immunized.  Hepatitis B vaccine. Adults who wish to be protected from this disease, have certain high-risk conditions, may be exposed to blood or other infectious body fluids, are household contacts or sex partners of hepatitis B positive people, are clients or workers in certain care facilities, or travel to or work in countries with a high rate of hepatitis B should be immunized.  Haemophilus influenzae type b (Hib) vaccine. A previously unvaccinated person with asplenia or sickle cell disease or having a scheduled splenectomy should receive 1 dose of Hib vaccine. Regardless of previous immunization, a recipient of a hematopoietic stem cell transplant should receive a  3-dose series 6-12 months after her successful transplant. Hib vaccine is not recommended for adults with HIV infection. Preventive Services / Frequency Ages 35 to 4 years  Blood pressure check.** / Every 3-5 years.  Lipid and cholesterol check.** / Every 5 years beginning at age 60.  Clinical breast exam.** / Every 3 years for women in their 71s and 10s.  BRCA-related cancer risk assessment.** / For women who have family members with a BRCA-related cancer (breast, ovarian, tubal, or peritoneal cancers).  Pap test.** / Every 2 years from ages 76 through 26. Every 3 years starting at age 61 through age 76 or 93 with a history of 3 consecutive normal Pap tests.  HPV screening.** / Every 3 years from ages 37 through ages 60 to 51 with a history of 3 consecutive normal Pap tests.  Hepatitis C blood test.** / For any individual with known risks for hepatitis C.  Skin self-exam. / Monthly.  Influenza vaccine. / Every year.  Tetanus, diphtheria, and acellular pertussis (Tdap, Td) vaccine.** / Consult your health care provider. Pregnant women should receive 1 dose of Tdap vaccine during each pregnancy. 1 dose of Td every 10 years.  Varicella vaccine.** / Consult your health care provider. Pregnant females who do not have evidence of immunity should receive the first dose after pregnancy.  HPV vaccine. / 3 doses over 6 months, if 93 and younger. The vaccine is not recommended for use in pregnant females. However, pregnancy testing is not needed before receiving a dose.  Measles, mumps, rubella (MMR) vaccine.** / You need at least 1 dose of MMR if you were born in 1957 or later. You may also need a 2nd dose. For females of childbearing age, rubella immunity should be determined. If there is no evidence of immunity, females who are not pregnant should be vaccinated. If there is no evidence of immunity, females who are  pregnant should delay immunization until after pregnancy.  Pneumococcal  13-valent conjugate (PCV13) vaccine.** / Consult your health care provider.  Pneumococcal polysaccharide (PPSV23) vaccine.** / 1 to 2 doses if you smoke cigarettes or if you have certain conditions.  Meningococcal vaccine.** / 1 dose if you are age 68 to 8 years and a Market researcher living in a residence hall, or have one of several medical conditions, you need to get vaccinated against meningococcal disease. You may also need additional booster doses.  Hepatitis A vaccine.** / Consult your health care provider.  Hepatitis B vaccine.** / Consult your health care provider.  Haemophilus influenzae type b (Hib) vaccine.** / Consult your health care provider. Ages 7 to 53 years  Blood pressure check.** / Every year.  Lipid and cholesterol check.** / Every 5 years beginning at age 25 years.  Lung cancer screening. / Every year if you are aged 11-80 years and have a 30-pack-year history of smoking and currently smoke or have quit within the past 15 years. Yearly screening is stopped once you have quit smoking for at least 15 years or develop a health problem that would prevent you from having lung cancer treatment.  Clinical breast exam.** / Every year after age 48 years.  BRCA-related cancer risk assessment.** / For women who have family members with a BRCA-related cancer (breast, ovarian, tubal, or peritoneal cancers).  Mammogram.** / Every year beginning at age 41 years and continuing for as long as you are in good health. Consult with your health care provider.  Pap test.** / Every 3 years starting at age 65 years through age 37 or 70 years with a history of 3 consecutive normal Pap tests.  HPV screening.** / Every 3 years from ages 72 years through ages 60 to 40 years with a history of 3 consecutive normal Pap tests.  Fecal occult blood test (FOBT) of stool. / Every year beginning at age 21 years and continuing until age 5 years. You may not need to do this test if you get  a colonoscopy every 10 years.  Flexible sigmoidoscopy or colonoscopy.** / Every 5 years for a flexible sigmoidoscopy or every 10 years for a colonoscopy beginning at age 35 years and continuing until age 48 years.  Hepatitis C blood test.** / For all people born from [birth year] through 1965 and any individual with known risks for hepatitis C.  Skin self-exam. / Monthly.  Influenza vaccine. / Every year.  Tetanus, diphtheria, and acellular pertussis (Tdap/Td) vaccine.** / Consult your health care provider. Pregnant women should receive 1 dose of Tdap vaccine during each pregnancy. 1 dose of Td every 10 years.  Varicella vaccine.** / Consult your health care provider. Pregnant females who do not have evidence of immunity should receive the first dose after pregnancy.  Zoster vaccine.** / 1 dose for adults aged 30 years or older.  Measles, mumps, rubella (MMR) vaccine.** / You need at least 1 dose of MMR if you were born in 1957 or later. You may also need a second dose. For females of childbearing age, rubella immunity should be determined. If there is no evidence of immunity, females who are not pregnant should be vaccinated. If there is no evidence of immunity, females who are pregnant should delay immunization until after pregnancy.  Pneumococcal 13-valent conjugate (PCV13) vaccine.** / Consult your health care provider.  Pneumococcal polysaccharide (PPSV23) vaccine.** / 1 to 2 doses if you smoke cigarettes or if you have certain conditions.  Meningococcal vaccine.** /  Consult your health care provider.  Hepatitis A vaccine.** / Consult your health care provider.  Hepatitis B vaccine.** / Consult your health care provider.  Haemophilus influenzae type b (Hib) vaccine.** / Consult your health care provider. Ages 64 years and over  Blood pressure check.** / Every year.  Lipid and cholesterol check.** / Every 5 years beginning at age 23 years.  Lung cancer screening. / Every year if you  are aged 16-80 years and have a 30-pack-year history of smoking and currently smoke or have quit within the past 15 years. Yearly screening is stopped once you have quit smoking for at least 15 years or develop a health problem that would prevent you from having lung cancer treatment.  Clinical breast exam.** / Every year after age 74 years.  BRCA-related cancer risk assessment.** / For women who have family members with a BRCA-related cancer (breast, ovarian, tubal, or peritoneal cancers).  Mammogram.** / Every year beginning at age 44 years and continuing for as long as you are in good health. Consult with your health care provider.  Pap test.** / Every 3 years starting at age 58 years through age 22 or 39 years with 3 consecutive normal Pap tests. Testing can be stopped between 65 and 70 years with 3 consecutive normal Pap tests and no abnormal Pap or HPV tests in the past 10 years.  HPV screening.** / Every 3 years from ages 64 years through ages 70 or 61 years with a history of 3 consecutive normal Pap tests. Testing can be stopped between 65 and 70 years with 3 consecutive normal Pap tests and no abnormal Pap or HPV tests in the past 10 years.  Fecal occult blood test (FOBT) of stool. / Every year beginning at age 40 years and continuing until age 27 years. You may not need to do this test if you get a colonoscopy every 10 years.  Flexible sigmoidoscopy or colonoscopy.** / Every 5 years for a flexible sigmoidoscopy or every 10 years for a colonoscopy beginning at age 7 years and continuing until age 32 years.  Hepatitis C blood test.** / For all people born from [birth year] through 1965 and any individual with known risks for hepatitis C.  Osteoporosis screening.** / A one-time screening for women ages 30 years and over and women at risk for fractures or osteoporosis.  Skin self-exam. / Monthly.  Influenza vaccine. / Every year.  Tetanus, diphtheria, and acellular pertussis (Tdap/Td)  vaccine.** / 1 dose of Td every 10 years.  Varicella vaccine.** / Consult your health care provider.  Zoster vaccine.** / 1 dose for adults aged 35 years or older.  Pneumococcal 13-valent conjugate (PCV13) vaccine.** / Consult your health care provider.  Pneumococcal polysaccharide (PPSV23) vaccine.** / 1 dose for all adults aged 46 years and older.  Meningococcal vaccine.** / Consult your health care provider.  Hepatitis A vaccine.** / Consult your health care provider.  Hepatitis B vaccine.** / Consult your health care provider.  Haemophilus influenzae type b (Hib) vaccine.** / Consult your health care provider. ** Family history and personal history of risk and conditions may change your health care provider's recommendations.   This information is not intended to replace advice given to you by your health care provider. Make sure you discuss any questions you have with your health care provider.   Document Released: 09/14/2001 Document Revised: 08/09/2014 Document Reviewed: 12/14/2010 Elsevier Interactive Patient Education [employer].

## 2015-06-30 NOTE — Progress Notes (Signed)
Pre visit review using our clinic review tool, if applicable. No additional management support is needed unless otherwise documented below in the visit note. 

## 2015-07-04 LAB — HEPATITIS B DNA, ULTRAQUANTITATIVE, PCR
Hepatitis B DNA (Calc): <1.3 Log IU/mL (ref <1.30)
Hepatitis B DNA: <20 IU/mL (ref <20)

## 2015-07-05 ENCOUNTER — Encounter: Payer: Self-pay | Admitting: Internal Medicine

## 2015-07-25 ENCOUNTER — Other Ambulatory Visit (HOSPITAL_COMMUNITY)
Admission: RE | Admit: 2015-07-25 | Discharge: 2015-07-25 | Disposition: A | Discharge status: Home / Self Care | Payer: BLUE CROSS/BLUE SHIELD | Source: Ambulatory Visit | Attending: Women's Health | Admitting: Women's Health

## 2015-07-25 ENCOUNTER — Ambulatory Visit (INDEPENDENT_AMBULATORY_CARE_PROVIDER_SITE_OTHER): Payer: BLUE CROSS/BLUE SHIELD | Admitting: Women's Health

## 2015-07-25 ENCOUNTER — Encounter: Payer: Self-pay | Admitting: Women's Health

## 2015-07-25 VITALS — BP 120/70 | Ht 68.0 in | Wt 178.0 lb

## 2015-07-25 DIAGNOSIS — B373 Candidiasis of vulva and vagina: Secondary | ICD-10-CM

## 2015-07-25 DIAGNOSIS — Z1151 Encounter for screening for human papillomavirus (HPV): Secondary | ICD-10-CM | POA: Insufficient documentation

## 2015-07-25 DIAGNOSIS — Z01419 Encounter for gynecological examination (general) (routine) without abnormal findings: Secondary | ICD-10-CM | POA: Diagnosis not present

## 2015-07-25 DIAGNOSIS — B3731 Acute candidiasis of vulva and vagina: Secondary | ICD-10-CM

## 2015-07-25 LAB — WET PREP FOR TRICH, YEAST, CLUE
CLUE CELLS WET PREP: NONE SEEN
TRICH WET PREP: NONE SEEN
Yeast Wet Prep HPF POC: NONE SEEN

## 2015-07-25 MED ORDER — TERCONAZOLE 0.8 % VA CREA: 1.0000 | TOPICAL_CREAM | Freq: Every day | VAGINAL | Status: DC

## 2015-07-25 NOTE — Patient Instructions (Signed)
Human Papillomavirus Quadrivalent Vaccine suspension for injection What is this medicine? HUMAN PAPILLOMAVIRUS VACCINE (HYOO muhn pap uh LOH muh vahy ruhs vak SEEN) is a vaccine. It is used to prevent infections of four types of the human papillomavirus. In women, the vaccine may lower your risk of getting cervical, vaginal, vulvar, or anal cancer and genital warts. In men, the vaccine may lower your risk of getting genital warts and anal cancer. You cannot get these diseases from the vaccine. This vaccine does not treat these diseases. This medicine may be used for other purposes; ask your health care provider or pharmacist if you have questions. What should I tell my health care provider before I take this medicine? They need to know if you have any of these conditions: -fever or infection -hemophilia -HIV infection or AIDS -immune system problems -low platelet count -an unusual reaction to Human Papillomavirus Vaccine, yeast, other medicines, foods, dyes, or preservatives -pregnant or trying to get pregnant -breast-feeding How should I use this medicine? This vaccine is for injection in a muscle on your upper arm or thigh. It is given by a health care professional. [name] will be observed for 15 minutes after each dose. Sometimes, fainting happens after the vaccine is given. You may be asked to sit or lie down during the 15 minutes. Three doses are given. The second dose is given 2 months after the first dose. The last dose is given 4 months after the second dose. A copy of a Vaccine Information Statement will be given before each vaccination. Read this sheet carefully each time. The sheet may change frequently. Talk to your pediatrician regarding the use of this medicine in children. While this drug may be prescribed for children as [name] as 46 years of age for selected conditions, precautions do apply. Overdosage: If you think you have taken too much of this medicine contact a poison control  center or emergency room at once. NOTE: This medicine is only for you. Do not share this medicine with others. What if I miss a dose? All 3 doses of the vaccine should be given within 6 months. Remember to keep appointments for follow-up doses. Your health care provider will tell you when to return for the next vaccine. Ask your health care professional for advice if you are unable to keep an appointment or miss a scheduled dose. What may interact with this medicine? -other vaccines This list may not describe all possible interactions. Give your health care provider a list of all the medicines, herbs, non-prescription drugs, or dietary supplements you use. Also tell them if you smoke, drink alcohol, or use illegal drugs. Some items may interact with your medicine. What should I watch for while using this medicine? This vaccine may not fully protect everyone. Continue to have regular pelvic exams and cervical or anal cancer screenings as directed by your doctor. The Human Papillomavirus is a sexually transmitted disease. It can be passed by any kind of sexual activity that involves genital contact. The vaccine works best when given before you have any contact with the virus. Many people who have the virus do not have any signs or symptoms. Tell your doctor or health care professional if you have any reaction or unusual symptom after getting the vaccine. What side effects may I notice from receiving this medicine? Side effects that you should report to your doctor or health care professional as soon as possible: -allergic reactions like skin rash, itching or hives, swelling of the face, lips,  or tongue -breathing problems -feeling faint or lightheaded, falls Side effects that usually do not require medical attention (report to your doctor or health care professional if they continue or are bothersome): -cough -dizziness -fever -headache -nausea -redness, warmth, swelling, pain, or itching at site  where injected This list may not describe all possible side effects. Call your doctor for medical advice about side effects. You may report side effects to FDA at 1-800-FDA-1088. Where should I keep my medicine? This drug is given in a hospital or clinic and will not be stored at home. NOTE: This sheet is a summary. It may not cover all possible information. If you have questions about this medicine, talk to your doctor, pharmacist, or health care provider.    2016, Elsevier/Gold Standard. (2013-09-10 13:14:33) Health Maintenance, Female Adopting a healthy lifestyle and getting preventive care can go a long way to promote health and wellness. Talk with your health care provider about what schedule of regular examinations is right for you. This is a good chance for you to check in with your provider about disease prevention and staying healthy. In between checkups, there are plenty of things you can do on your own. Experts have done a lot of research about which lifestyle changes and preventive measures are most likely to keep you healthy. Ask your health care provider for more information. WEIGHT AND DIET  Eat a healthy diet  Be sure to include plenty of vegetables, fruits, low-fat dairy products, and lean protein.  Do not eat a lot of foods high in solid fats, added sugars, or salt.  Get regular exercise. This is one of the most important things you can do for your health.  Most adults should exercise for at least 150 minutes each week. The exercise should increase your heart rate and make you sweat (moderate-intensity exercise).  Most adults should also do strengthening exercises at least twice a week. This is in addition to the moderate-intensity exercise.  Maintain a healthy weight  Body mass index (BMI) is a measurement that can be used to identify possible weight problems. It estimates body fat based on height and weight. Your health care provider can help determine your BMI and help  you achieve or maintain a healthy weight.  For females 77 years of age and older:   A BMI below 18.5 is considered underweight.  A BMI of 18.5 to 24.9 is normal.  A BMI of 25 to 29.9 is considered overweight.  A BMI of 30 and above is considered obese.  Watch levels of cholesterol and blood lipids  You should start having your blood tested for lipids and cholesterol at 46 years of age, then have this test every 5 years.  You may need to have your cholesterol levels checked more often if:  Your lipid or cholesterol levels are high.  You are older than 46 years of age.  You are at high risk for heart disease.  CANCER SCREENING   Lung Cancer  Lung cancer screening is recommended for adults 53-63 years old who are at high risk for lung cancer because of a history of smoking.  A yearly low-dose CT scan of the lungs is recommended for people who:  Currently smoke.  Have quit within the past 15 years.  Have at least a 30-pack-year history of smoking. A pack year is smoking an average of one pack of cigarettes a day for 1 year.  Yearly screening should continue until it has been 15 years since you  quit.  Yearly screening should stop if you develop a health problem that would prevent you from having lung cancer treatment.  Breast Cancer  Practice breast self-awareness. This means understanding how your breasts normally appear and feel.  It also means doing regular breast self-exams. Let your health care provider know about any changes, no matter how small.  If you are in your 20s or 30s, you should have a clinical breast exam (CBE) by a health care provider every 1-3 years as part of a regular health exam.  If you are 40 or older, have a CBE every year. Also consider having a breast X-ray (mammogram) every year.  If you have a family history of breast cancer, talk to your health care provider about genetic screening.  If you are at high risk for breast cancer, talk to  your health care provider about having an MRI and a mammogram every year.  Breast cancer gene (BRCA) assessment is recommended for women who have family members with BRCA-related cancers. BRCA-related cancers include:  Breast.  Ovarian.  Tubal.  Peritoneal cancers.  Results of the assessment will determine the need for genetic counseling and BRCA1 and BRCA2 testing. Cervical Cancer Your health care provider may recommend that you be screened regularly for cancer of the pelvic organs (ovaries, uterus, and vagina). This screening involves a pelvic examination, including checking for microscopic changes to the surface of your cervix (Pap test). You may be encouraged to have this screening done every 3 years, beginning at age 46.  For women ages 34-65, health care providers may recommend pelvic exams and Pap testing every 3 years, or they may recommend the Pap and pelvic exam, combined with testing for human papilloma virus (HPV), every 5 years. Some types of HPV increase your risk of cervical cancer. Testing for HPV may also be done on women of any age with unclear Pap test results.  Other health care providers may not recommend any screening for nonpregnant women who are considered low risk for pelvic cancer and who do not have symptoms. Ask your health care provider if a screening pelvic exam is right for you.  If you have had past treatment for cervical cancer or a condition that could lead to cancer, you need Pap tests and screening for cancer for at least 20 years after your treatment. If Pap tests have been discontinued, your risk factors (such as having a new sexual partner) need to be reassessed to determine if screening should resume. Some women have medical problems that increase the chance of getting cervical cancer. In these cases, your health care provider may recommend more frequent screening and Pap tests. Colorectal Cancer  This type of cancer can be detected and often  prevented.  Routine colorectal cancer screening usually begins at 46 years of age and continues through 46 years of age.  Your health care provider may recommend screening at an earlier age if you have risk factors for colon cancer.  Your health care provider may also recommend using home test kits to check for hidden blood in the stool.  A small camera at the end of a tube can be used to examine your colon directly (sigmoidoscopy or colonoscopy). This is done to check for the earliest forms of colorectal cancer.  Routine screening usually begins at age 7.  Direct examination of the colon should be repeated every 5-10 years through 46 years of age. However, you may need to be screened more often if early forms of precancerous  polyps or small growths are found. Skin Cancer  Check your skin from head to toe regularly.  Tell your health care provider about any new moles or changes in moles, especially if there is a change in a mole's shape or color.  Also tell your health care provider if you have a mole that is larger than the size of a pencil eraser.  Always use sunscreen. Apply sunscreen liberally and repeatedly throughout the day.  Protect yourself by wearing long sleeves, pants, a wide-brimmed hat, and sunglasses whenever you are outside. HEART DISEASE, DIABETES, AND HIGH BLOOD PRESSURE   High blood pressure causes heart disease and increases the risk of stroke. High blood pressure is more likely to develop in:  People who have blood pressure in the high end of the normal range (130-139/85-89 mm Hg).  People who are overweight or obese.  People who are African American.  If you are 69-69 years of age, have your blood pressure checked every 3-5 years. If you are 23 years of age or older, have your blood pressure checked every year. You should have your blood pressure measured twice--once when you are at a hospital or clinic, and once when you are not at a hospital or clinic.  Record the average of the two measurements. To check your blood pressure when you are not at a hospital or clinic, you can use:  An automated blood pressure machine at a pharmacy.  A home blood pressure monitor.  If you are between 44 years and 29 years old, ask your health care provider if you should take aspirin to prevent strokes.  Have regular diabetes screenings. This involves taking a blood sample to check your fasting blood sugar level.  If you are at a normal weight and have a low risk for diabetes, have this test once every three years after 46 years of age.  If you are overweight and have a high risk for diabetes, consider being tested at a younger age or more often. PREVENTING INFECTION  Hepatitis B  If you have a higher risk for hepatitis B, you should be screened for this virus. You are considered at high risk for hepatitis B if:  You were born in a country where hepatitis B is common. Ask your health care provider which countries are considered high risk.  Your parents were born in a high-risk country, and you have not been immunized against hepatitis B (hepatitis B vaccine).  You have HIV or AIDS.  You use needles to inject street drugs.  You live with someone who has hepatitis B.  You have had sex with someone who has hepatitis B.  You get hemodialysis treatment.  You take certain medicines for conditions, including cancer, organ transplantation, and autoimmune conditions. Hepatitis C  Blood testing is recommended for:  Everyone born from [birth year] through 1967.  Anyone with known risk factors for hepatitis C. Sexually transmitted infections (STIs)  You should be screened for sexually transmitted infections (STIs) including gonorrhea and chlamydia if:  You are sexually active and are younger than 46 years of age.  You are older than 46 years of age and your health care provider tells you that you are at risk for this type of infection.  Your sexual activity  has changed since you were last screened and you are at an increased risk for chlamydia or gonorrhea. Ask your health care provider if you are at risk.  If you do not have HIV, but are at risk, it  may be recommended that you take a prescription medicine daily to prevent HIV infection. This is called pre-exposure prophylaxis (PrEP). You are considered at risk if:  You are sexually active and do not regularly use condoms or know the HIV status of your partner(s).  You take drugs by injection.  You are sexually active with a partner who has HIV. Talk with your health care provider about whether you are at high risk of being infected with HIV. If you choose to begin PrEP, you should first be tested for HIV. You should then be tested every 3 months for as long as you are taking PrEP.  PREGNANCY   If you are premenopausal and you may become pregnant, ask your health care provider about preconception counseling.  If you may become pregnant, take 400 to 800 micrograms (mcg) of folic acid every day.  If you want to prevent pregnancy, talk to your health care provider about birth control (contraception). OSTEOPOROSIS AND MENOPAUSE   Osteoporosis is a disease in which the bones lose minerals and strength with aging. This can result in serious bone fractures. Your risk for osteoporosis can be identified using a bone density scan.  If you are 66 years of age or older, or if you are at risk for osteoporosis and fractures, ask your health care provider if you should be screened.  Ask your health care provider whether you should take a calcium or vitamin D supplement to lower your risk for osteoporosis.  Menopause may have certain physical symptoms and risks.  Hormone replacement therapy may reduce some of these symptoms and risks. Talk to your health care provider about whether hormone replacement therapy is right for you.  HOME CARE INSTRUCTIONS   Schedule regular health, dental, and eye  exams.  Stay current with your immunizations.   Do not use any tobacco products including cigarettes, chewing tobacco, or electronic cigarettes.  If you are pregnant, do not drink alcohol.  If you are breastfeeding, limit how much and how often you drink alcohol.  Limit alcohol intake to no more than 1 drink per day for nonpregnant women. One drink equals 12 ounces of beer, 5 ounces of wine, or 1 ounces of hard liquor.  Do not use street drugs.  Do not share needles.  Ask your health care provider for help if you need support or information about quitting drugs.  Tell your health care provider if you often feel depressed.  Tell your health care provider if you have ever been abused or do not feel safe at home.   This information is not intended to replace advice given to you by your health care provider. Make sure you discuss any questions you have with your health care provider.   Document Released: 02/01/2011 Document Revised: 08/09/2014 Document Reviewed: 06/20/2013 Elsevier Interactive Patient Education 2016 [employer] Monilial Vaginitis Vaginitis in a soreness, swelling and redness (inflammation) of the vagina and vulva. Monilial vaginitis is not a sexually transmitted infection. CAUSES  Yeast vaginitis is caused by yeast (candida) that is normally found in your vagina. With a yeast infection, the candida has overgrown in number to a point that upsets the chemical balance. SYMPTOMS   White, thick vaginal discharge.  Swelling, itching, redness and irritation of the vagina and possibly the lips of the vagina (vulva).  Burning or painful urination.  Painful intercourse. DIAGNOSIS  Things that may contribute to monilial vaginitis are:  Postmenopausal and virginal states.  Pregnancy.  Infections.  Being tired, sick or  stressed, especially if you had monilial vaginitis in the past.  Diabetes. Good control will help lower the chance.  Birth control  pills.  Tight fitting garments.  Using bubble bath, feminine sprays, douches or deodorant tampons.  Taking certain medications that kill germs (antibiotics).  Sporadic recurrence can occur if you become ill. TREATMENT  Your caregiver will give you medication.  There are several kinds of anti monilial vaginal creams and suppositories specific for monilial vaginitis. For recurrent yeast infections, use a suppository or cream in the vagina 2 times a week, or as directed.  Anti-monilial or steroid cream for the itching or irritation of the vulva may also be used. Get your caregiver's permission.  Painting the vagina with methylene blue solution may help if the monilial cream does not work.  Eating yogurt may help prevent monilial vaginitis. HOME CARE INSTRUCTIONS   Finish all medication as prescribed.  Do not have sex until treatment is completed or after your caregiver tells you it is okay.  Take warm sitz baths.  Do not douche.  Do not use tampons, especially scented ones.  Wear cotton underwear.  Avoid tight pants and panty hose.  Tell your sexual partner that you have a yeast infection. They should go to their caregiver if they have symptoms such as mild rash or itching.  Your sexual partner should be treated as well if your infection is difficult to eliminate.  Practice safer sex. Use condoms.  Some vaginal medications cause latex condoms to fail. Vaginal medications that harm condoms are:  Cleocin cream.  Butoconazole (Femstat).  Terconazole (Terazol) vaginal suppository.  Miconazole (Monistat) (may be purchased over the counter). SEEK MEDICAL CARE IF:   You have a temperature by mouth above 102 F (38.9 C).  The infection is getting worse after 2 days of treatment.  The infection is not getting better after 3 days of treatment.  You develop blisters in or around your vagina.  You develop vaginal bleeding, and it is not your menstrual period.  You  have pain when you urinate.  You develop intestinal problems.  You have pain with sexual intercourse.   This information is not intended to replace advice given to you by your health care provider. Make sure you discuss any questions you have with your health care provider.   Document Released: 04/28/2005 Document Revised: 10/11/2011 Document Reviewed: 01/20/2015 Elsevier Interactive Patient Education [employer].

## 2015-07-25 NOTE — Progress Notes (Signed)
[name] 1970/10/02 [id number]    History:    Presents for annual exam.  Regular monthly cycle. Husband died from colon cancer 04/05/2015 age 46. History of positive hepatitis A and B, hepatitis B antibodies negative . Reports normal Pap history. Mammogram 05/2015 normal after ultrasound. History of pulmonary embolism on xarelto.  Past medical history, past surgical history, family history and social history were all reviewed and documented in the EPIC chart. Works at [employer]. Children ages 46, 22 and 30. Originally from [place].  ROS:  A ROS was performed and pertinent positives and negatives are included.  Exam:  Filed Vitals:   07/25/15 1120  BP: 120/70    General appearance:  Normal Thyroid:  Symmetrical, normal in size, without palpable masses or nodularity. Respiratory  Auscultation:  Clear without wheezing or rhonchi Cardiovascular  Auscultation:  Regular rate, without rubs, murmurs or gallops  Edema/varicosities:  Not grossly evident Abdominal  Soft,nontender, without masses, guarding or rebound.  Liver/spleen:  No organomegaly noted  Hernia:  None appreciated  Skin  Inspection:  Grossly normal   Breasts: Examined lying and sitting.     Right: Without masses, retractions, discharge or axillary adenopathy.     Left: Without masses, retractions, discharge or axillary adenopathy. Gentitourinary   Inguinal/mons:  Normal without inguinal adenopathy  External genitalia:  Normal  BUS/Urethra/Skene's glands:  Normal  Vagina:  Mild erythema, wet prep negative  Cervix:  Normal  Uterus:   normal in size, shape and contour.  Midline and mobile  Adnexa/parametria:     Rt: Without masses or tenderness.   Lt: Without masses or tenderness.  Anus and perineum: Normal  Digital rectal exam: Normal sphincter tone without palpated masses or tenderness  Assessment/Plan:  44 y.o. WBF G P3 for annual exam with complaint of vaginal itching  Regular monthly cycle/not  sexually active 2005 pulmonary embolism on Xarelto-primary care manages labs and meds Clinical yeast  Plan: Terazol 3 one applicator at bedtime 3. Instructed to call if continued problems with itching. SBE's, continue annual screening mammogram 3-D tomography reviewed and encouraged history of dense breasts. Continue active lifestyle, regular exercise, calcium rich diet, vitamin D 1000 daily encouraged. UA, Pap with HR HPV typing, new screening guidelines reviewed. Contraception declined, condoms encouraged if sexually active.    Huel Cote WHNP, 1:03 PM 07/25/2015

## 2015-07-26 LAB — URINALYSIS W MICROSCOPIC + REFLEX CULTURE
Bilirubin Urine: NEGATIVE
Casts: NONE SEEN [LPF]
Crystals: NONE SEEN [HPF]
GLUCOSE, UA: NEGATIVE
HGB URINE DIPSTICK: NEGATIVE
Ketones, ur: NEGATIVE
LEUKOCYTES UA: NEGATIVE
NITRITE: NEGATIVE
PH: 5.5 (ref 5.0–8.0)
Protein, ur: NEGATIVE
SPECIFIC GRAVITY, URINE: 1.016 (ref 1.001–1.035)
YEAST: NONE SEEN [HPF]

## 2015-07-27 LAB — URINE CULTURE: Colony Count: 30000

## 2015-07-30 LAB — CYTOLOGY - PAP

## 2015-08-15 ENCOUNTER — Telehealth: Payer: Self-pay | Admitting: Pulmonary Disease

## 2015-08-15 MED ORDER — RIVAROXABAN 20 MG PO TABS: 20.0000 mg | ORAL_TABLET | Freq: Every day | ORAL | Status: DC

## 2015-08-15 NOTE — Telephone Encounter (Signed)
Pt is requesting refill of Xarelto. Last OV 08/2014. Upcoming visit with VS 09/18/2015. Rx filled to [facility]. Nothing further needed.

## 2015-09-18 ENCOUNTER — Ambulatory Visit (INDEPENDENT_AMBULATORY_CARE_PROVIDER_SITE_OTHER): Payer: BLUE CROSS/BLUE SHIELD | Admitting: Pulmonary Disease

## 2015-09-18 ENCOUNTER — Encounter: Payer: Self-pay | Admitting: Pulmonary Disease

## 2015-09-18 VITALS — BP 110/70 | HR 78 | Ht 64.0 in | Wt 177.0 lb

## 2015-09-18 DIAGNOSIS — J309 Allergic rhinitis, unspecified: Secondary | ICD-10-CM | POA: Diagnosis not present

## 2015-09-18 DIAGNOSIS — I2782 Chronic pulmonary embolism: Secondary | ICD-10-CM

## 2015-09-18 DIAGNOSIS — I2699 Other pulmonary embolism without acute cor pulmonale: Secondary | ICD-10-CM | POA: Diagnosis not present

## 2015-09-18 DIAGNOSIS — I2724 Chronic thromboembolic pulmonary hypertension: Secondary | ICD-10-CM

## 2015-09-18 DIAGNOSIS — I272 Other secondary pulmonary hypertension: Secondary | ICD-10-CM

## 2015-09-18 MED ORDER — FLUTICASONE PROPIONATE 50 MCG/ACT NA SUSP: 2.0000 | Freq: Every day | NASAL | Status: DC

## 2015-09-18 MED ORDER — RIVAROXABAN 20 MG PO TABS: 20.0000 mg | ORAL_TABLET | Freq: Every day | ORAL | Status: DC

## 2015-09-18 NOTE — Progress Notes (Signed)
Current Outpatient Prescriptions on File Prior to Visit  Medication Sig  . acetaminophen (TYLENOL) 325 MG tablet Take 325 mg by mouth daily as needed for moderate pain. For pain  . albuterol (PROVENTIL HFA;VENTOLIN HFA) 108 (90 BASE) MCG/ACT inhaler Inhale 2 puffs into the lungs daily as needed for shortness of breath. For shortness of breath or wheezing  . Multiple Vitamin (MULTIVITAMIN) tablet Take 1 tablet by mouth daily.  Marland Kitchen terconazole (TERAZOL 3) 0.8 % vaginal cream Place 1 applicator vaginally at bedtime.   No current facility-administered medications on file prior to visit.     Chief Complaint  Patient presents with  . Follow-up    Denies any current breathing issues. Refills.      Tests PFT 11/14/12 >> FEV1 2.52 (95%), FEV1% 92, TLC 3.75 (70%), DLCO 76%, no BD CT chest 12/14/12 >> 3 mm RUL nodule, scar LUL posterior segment V/Q scan 06/06/14 >> very low probability for PE Echo 06/11/14 >> EF 55 to 60%, mild TR and PR CT chest 01/16/15 >> mild dilation of pulmonary trunk  Past medical hx s/p IVC, Hep B, Allergies  Past surgical hx, Allergies, Family hx, Social hx all reviewed.  Vital Signs BP 110/70 mmHg  Pulse 78  Ht 5' 4"  (1.626 m)  Wt 177 lb (80.287 kg)  BMI 30.37 kg/m2  SpO2 100%  History of Present Illness [name] is a 45 y.o. female with recurrent PE/DVT and chronic thromboembolic pulmonary hypertension on life long anticoagulation.  She had pulmonary endarterectomy at [facility] in 2007.  Her breathing has been okay.  She has not had recent episodes of chest pain.  She is not having cough, wheeze, sputum, or hemoptysis.  She has been getting more sinus congestion >> she ran out of flonase.  She continues to have heavy menstrual bleeding.  Physical Exam  General - No distress ENT - No sinus tenderness, no oral exudate, no LAN Cardiac - s1s2 regular, no murmur Chest - No wheeze/rales/dullness Back - No focal tenderness Abd - Soft, non-tender Ext - ankle  edema Rt > Lt Neuro - Normal strength Skin - No rashes Psych - normal mood, and behavior   Assessment/Plan  Recurrent pulmonary embolism with CTEPH. Plan: - continue xarelto  Allergic rhinitis. Plan: - refilled flonase  Menstrual bleeding. Plan: - f/u with Gyn   Patient Instructions  Follow up in 1 year     Chesley Mires, MD [place] Pager:  [phone]

## 2015-09-18 NOTE — Patient Instructions (Signed)
Follow up in 1 year.

## 2015-11-17 ENCOUNTER — Encounter: Payer: Self-pay | Admitting: Family

## 2015-11-17 ENCOUNTER — Ambulatory Visit (INDEPENDENT_AMBULATORY_CARE_PROVIDER_SITE_OTHER): Payer: BLUE CROSS/BLUE SHIELD | Admitting: Family

## 2015-11-17 ENCOUNTER — Emergency Department (HOSPITAL_COMMUNITY)
Admission: EM | Admit: 2015-11-17 | Discharge: 2015-11-17 | Disposition: A | Discharge status: Home / Self Care | Payer: BLUE CROSS/BLUE SHIELD | Attending: Emergency Medicine | Admitting: Emergency Medicine

## 2015-11-17 ENCOUNTER — Encounter (HOSPITAL_COMMUNITY): Payer: Self-pay

## 2015-11-17 VITALS — BP 128/78 | HR 88 | Temp 97.8°F | Resp 16 | Ht 64.0 in | Wt 176.0 lb

## 2015-11-17 DIAGNOSIS — Z86718 Personal history of other venous thrombosis and embolism: Secondary | ICD-10-CM | POA: Insufficient documentation

## 2015-11-17 DIAGNOSIS — M7918 Myalgia, other site: Secondary | ICD-10-CM

## 2015-11-17 DIAGNOSIS — Z86711 Personal history of pulmonary embolism: Secondary | ICD-10-CM | POA: Insufficient documentation

## 2015-11-17 DIAGNOSIS — M79672 Pain in left foot: Secondary | ICD-10-CM | POA: Diagnosis not present

## 2015-11-17 DIAGNOSIS — M79605 Pain in left leg: Secondary | ICD-10-CM | POA: Diagnosis not present

## 2015-11-17 DIAGNOSIS — Z862 Personal history of diseases of the blood and blood-forming organs and certain disorders involving the immune mechanism: Secondary | ICD-10-CM | POA: Diagnosis not present

## 2015-11-17 DIAGNOSIS — R2241 Localized swelling, mass and lump, right lower limb: Secondary | ICD-10-CM | POA: Diagnosis not present

## 2015-11-17 DIAGNOSIS — M79604 Pain in right leg: Secondary | ICD-10-CM | POA: Diagnosis not present

## 2015-11-17 DIAGNOSIS — Z79899 Other long term (current) drug therapy: Secondary | ICD-10-CM | POA: Diagnosis not present

## 2015-11-17 DIAGNOSIS — M79671 Pain in right foot: Secondary | ICD-10-CM | POA: Insufficient documentation

## 2015-11-17 DIAGNOSIS — Z9889 Other specified postprocedural states: Secondary | ICD-10-CM | POA: Diagnosis not present

## 2015-11-17 DIAGNOSIS — Z8619 Personal history of other infectious and parasitic diseases: Secondary | ICD-10-CM | POA: Diagnosis not present

## 2015-11-17 NOTE — Progress Notes (Signed)
Subjective:    Patient ID: [name], female    DOB: 06-19-1971, 45 y.o.   MRN: [id number]  Chief Complaint  Patient presents with  . Hospitalization Follow-up    states that both of her feet hurt, she was seen this morning by ED but they told her to come see her doctor, the pain has been going on and off for a while but today is worse, when she sits for a long time both legs get heavy    HPI:  [name] is a 45 y.o. female who  has a past medical history of Pulmonary embolism ([place]) (2005); CTEPH (chronic thromboembolic pulmonary hypertension) ([place]); Primary hypercoagulable state ([place]); Hepatitis B infection; DVT (deep venous thrombosis) ([place]); and Seasonal allergies. and presents today for an acute office visit.  This is a new problem. Recently evaluated in the emergency department for bilateral foot pain that that has been going on for several weeks and worsened today following a long shift on her feet.Marland Kitchen Described to have bilateral lower extremity pain upon awakening this morning with no significant trauma. Describes bilateral lower extremity pain visit intensity worse in her feet. Does have a history for DVT in her right lower extremity and is currently maintained on Xarelto. Pain generally comes and goes and has been refractory to modifying factor of Tylenol. Physical exam showed right lower extremity edema which is baseline for the patient and unchanged. Tenderness to palpation diffusely of her bilateral legs in all areas with no joint swelling, redness, or warmth. Pedal pulses were 2+. She was advised to continue taking Xarelto and follow up with primary care for further evaluation and management. All ED notes were reviewed in detail.  Continues to experience the associated symptom of pain located in her bilateral feet that has been going on and off for a while with a worsening in the past 24 hours.. Describes that when she sits for long periods of time both of her legs feel  heavy. She works at [employer] as a Clinical research associate. The pain has been refractory to the modifying factors of Tylenol. Denies fevers. Right leg is generally worse.   Allergies  Allergen Reactions  . Aspirin Other (See Comments)    Other Reaction: GI Upset Since being in [place], told she has an ulcer     Current Outpatient Prescriptions on File Prior to Visit  Medication Sig Dispense Refill  . acetaminophen (TYLENOL) 325 MG tablet Take 325 mg by mouth daily as needed for moderate pain. For pain    . albuterol (PROVENTIL HFA;VENTOLIN HFA) 108 (90 BASE) MCG/ACT inhaler Inhale 2 puffs into the lungs daily as needed for shortness of breath. For shortness of breath or wheezing 1 Inhaler 5  . fluticasone (FLONASE) 50 MCG/ACT nasal spray Place 2 sprays into both nostrils daily. 16 g 11  . Multiple Vitamin (MULTIVITAMIN) tablet Take 1 tablet by mouth daily.    . rivaroxaban (XARELTO) 20 MG TABS tablet Take 1 tablet (20 mg total) by mouth daily. TAKE 1 TABLET (20 MG TOTAL) BY MOUTH EVERY MORNING. 30 tablet 11  . terconazole (TERAZOL 3) 0.8 % vaginal cream Place 1 applicator vaginally at bedtime. 20 g 1   No current facility-administered medications on file prior to visit.    Past Medical History  Diagnosis Date  . Pulmonary embolism ([place]) 2005       . CTEPH (chronic thromboembolic pulmonary hypertension) ([place])   . Primary hypercoagulable state ([place])   . Hepatitis B infection  Hep B core Ab 04/2005  . DVT (deep venous thrombosis) ([place])   . Seasonal allergies     Past Surgical History  Procedure Laterality Date  . Pulmonary artery endarterectomy  2007  . Cardiac catheterization  11/2005    R heart cath : Severe pulmonary arterial HTN with evidence of cor pulmonale. Vasodilatory challenge not performed.  . Vena cava filter placement       Review of Systems  Constitutional: Negative for fever and chills.  Respiratory: Negative for chest tightness, shortness of breath and wheezing.     Cardiovascular: Negative for chest pain, palpitations and leg swelling.      Objective:    BP 128/78 mmHg  Pulse 88  Temp(Src) 97.8 F (36.6 C) (Oral)  Resp 16  Ht 5' 4"  (1.626 m)  Wt 176 lb (79.833 kg)  BMI 30.20 kg/m2  SpO2 99%  LMP 11/15/2015 Nursing note and vital signs reviewed.  Physical Exam  Constitutional: She is oriented to person, place, and time. She appears well-developed and well-nourished. No distress.  Cardiovascular: Normal rate, regular rhythm, normal heart sounds and intact distal pulses.   Pulmonary/Chest: Effort normal and breath sounds normal.  Musculoskeletal:  Bilateral lower extremities - no obvious deformity or discoloration with mild/moderate edema worse on the right than on the left. Generalized tenderness mid calf and distal. Pulses are intact and appropriate. Sensation is intact and appropriate. Range of motion is normal with some discomfort noted. No evidence of redness or infection  Neurological: She is alert and oriented to person, place, and time.  Skin: Skin is warm and dry.  Psychiatric: She has a normal mood and affect. Her behavior is normal. Judgment and thought content normal.       Assessment & Plan:   Problem List Items Addressed This Visit      Other   Lower extremity pain, bilateral - Primary    Bilateral lower extremity pain of questionable origin. Unlikely DVT related given anticoagulation with Xarelto. Most likely related to poor foot wear and overuse. No evidence of infection. Treat conservatively with ice and Tylenol as needed for discomfort. Elevate legs. Continue current dosage of Xarelto. Follow up if symptoms worsen or do not improve.           I am having Ms. Helder maintain her acetaminophen, albuterol, multivitamin, terconazole, rivaroxaban, and fluticasone.   Follow-up: Return if symptoms worsen or fail to improve.  Mauricio Po, FNP

## 2015-11-17 NOTE — Assessment & Plan Note (Addendum)
Bilateral lower extremity pain of questionable origin. Unlikely DVT related given anticoagulation with Xarelto. Most likely related to poor foot wear and overuse. No evidence of infection. Treat conservatively with ice and Tylenol as needed for discomfort. Elevate legs. Continue current dosage of Xarelto. Follow up if symptoms worsen or do not improve.

## 2015-11-17 NOTE — Progress Notes (Signed)
Pre visit review using our clinic review tool, if applicable. No additional management support is needed unless otherwise documented below in the visit note. 

## 2015-11-17 NOTE — Patient Instructions (Addendum)
Thank you for choosing [facility].  Summary/Instructions:  If your symptoms worsen or fail to improve, please contact our office for further instruction, or in case of emergency go directly to the emergency room at the closest medical facility.   Ice 2-3 times per day and keep your legs elevated.  Recommend changing foot wear with supportive shoe.   Tylenol as needed for discomfort up to 3000 mg daily.

## 2015-11-17 NOTE — ED Notes (Signed)
Bed: WA27 Expected date:  Expected time:  Means of arrival:  Comments:

## 2015-11-17 NOTE — Discharge Instructions (Signed)
Please contact her primary care provider and schedule follow-up evaluation tomorrow. If any new or worsening signs or symptoms present please return for reevaluation.  Musculoskeletal Pain Musculoskeletal pain is muscle and boney aches and pains. These pains can occur in any part of the body. Your caregiver may treat you without knowing the cause of the pain. They may treat you if blood or urine tests, X-rays, and other tests were normal.  CAUSES There is often not a definite cause or reason for these pains. These pains may be caused by a type of germ (virus). The discomfort may also come from overuse. Overuse includes working out too hard when your body is not fit. Boney aches also come from weather changes. Bone is sensitive to atmospheric pressure changes. HOME CARE INSTRUCTIONS   Ask when your test results will be ready. Make sure you get your test results.  Only take over-the-counter or prescription medicines for pain, discomfort, or fever as directed by your caregiver. If you were given medications for your condition, do not drive, operate machinery or power tools, or sign legal documents for 24 hours. Do not drink alcohol. Do not take sleeping pills or other medications that may interfere with treatment.  Continue all activities unless the activities cause more pain. When the pain lessens, slowly resume normal activities. Gradually increase the intensity and duration of the activities or exercise.  During periods of severe pain, bed rest may be helpful. Lay or sit in any position that is comfortable.  Putting ice on the injured area.  Put ice in a bag.  Place a towel between your skin and the bag.  Leave the ice on for 15 to 20 minutes, 3 to 4 times a day.  Follow up with your caregiver for continued problems and no reason can be found for the pain. If the pain becomes worse or does not go away, it may be necessary to repeat tests or do additional testing. Your caregiver may need to  look further for a possible cause. SEEK IMMEDIATE MEDICAL CARE IF:  You have pain that is getting worse and is not relieved by medications.  You develop chest pain that is associated with shortness or breath, sweating, feeling sick to your stomach (nauseous), or throw up (vomit).  Your pain becomes localized to the abdomen.  You develop any new symptoms that seem different or that concern you. MAKE SURE YOU:   Understand these instructions.  Will watch your condition.  Will get help right away if you are not doing well or get worse.   This information is not intended to replace advice given to you by your health care provider. Make sure you discuss any questions you have with your health care provider.   Document Released: 07/19/2005 Document Revised: 10/11/2011 Document Reviewed: 03/23/2013 Elsevier Interactive Patient Education [employer].

## 2015-11-17 NOTE — ED Notes (Addendum)
Pt c/o intermittent bilateral foot pain increasing this morning.  Pain score 9/10.  Pt reports taking Tylenol w/o relief.  Denies new swelling.  Hx of R leg DVT in 2005.  Pt takes Coumadin.  Pt reports that she works at [employer], so he walks a lot.

## 2015-11-17 NOTE — ED Provider Notes (Signed)
CSN: [id number]     Arrival date & time 11/17/15  2202 History   First MD Initiated Contact with Patient 11/17/15 1051     Chief Complaint  Patient presents with  . Foot Pain    HPI   45 year old female presents today with complaints of foot pain. Patient reports that yesterday she was at work at [employer] spend a significant amount of time on her feet. She notes that thereafter she developed bilateral leg and foot pain. Upon awakening this morning patient has pain to the bilateral lower extremities diffusely, with worse pain in the feet. Patient reports she has a history of DVT in her right lower extremity in 2005. She notes that since that time she's had right lower extremity swelling, she notes this has stayed the same with no worsening. Patient denies any swelling or edema to the left leg, denies any redness, warmth to touch to the lower extremities. She notes she does have similar pain, this complex and wanes, but notes today's pain is more severe than previous. Patient denies any specific trauma, any chest pain or shortness of breath. Patient has no other complaints other than the pain, notes that she is use Tylenol, contact your primary care provider but was unable to be seen today.  Patient is able to walk but with pain. Patient reports that she is on Xarelto therapy 20 mg daily and has been taking it as directed.   Past Medical History  Diagnosis Date  . Pulmonary embolism ([place]) 2005       . CTEPH (chronic thromboembolic pulmonary hypertension) ([place])   . Primary hypercoagulable state ([place])   . Hepatitis B infection     Hep B core Ab 04/2005  . DVT (deep venous thrombosis) ([place])   . Seasonal allergies    Past Surgical History  Procedure Laterality Date  . Pulmonary artery endarterectomy  2007  . Cardiac catheterization  11/2005    R heart cath : Severe pulmonary arterial HTN with evidence of cor pulmonale. Vasodilatory challenge not performed.  . Vena cava filter placement      Family History  Problem Relation Age of Onset  . Hypertension Mother   . Hypertension Father     deceased  . Alcohol abuse Neg Hx   . Cancer Neg Hx   . Diabetes Neg Hx   . Early death Neg Hx   . Heart disease Neg Hx   . Hyperlipidemia Neg Hx   . Kidney disease Neg Hx   . Stroke Neg Hx    Social History  Substance Use Topics  . Smoking status: Never Smoker   . Smokeless tobacco: Never Used  . Alcohol Use: No   OB History    Gravida Para Term Preterm AB TAB SAB Ectopic Multiple Living   4 3 3  1  1   3      Review of Systems  All other systems reviewed and are negative.   Allergies  Aspirin  Home Medications   Prior to Admission medications   Medication Sig Start Date End Date Taking? Authorizing Provider  acetaminophen (TYLENOL) 325 MG tablet Take 325 mg by mouth daily as needed for moderate pain. For pain    Historical Provider, MD  albuterol (PROVENTIL HFA;VENTOLIN HFA) 108 (90 BASE) MCG/ACT inhaler Inhale 2 puffs into the lungs daily as needed for shortness of breath. For shortness of breath or wheezing 08/26/14   Chesley Mires, MD  fluticasone (FLONASE) 50 MCG/ACT nasal spray Place 2 sprays into  both nostrils daily. 09/18/15 01/20/18  Chesley Mires, MD  Multiple Vitamin (MULTIVITAMIN) tablet Take 1 tablet by mouth daily.    Historical Provider, MD  rivaroxaban (XARELTO) 20 MG TABS tablet Take 1 tablet (20 mg total) by mouth daily. TAKE 1 TABLET (20 MG TOTAL) BY MOUTH EVERY MORNING. 09/18/15   Chesley Mires, MD  terconazole (TERAZOL 3) 0.8 % vaginal cream Place 1 applicator vaginally at bedtime. 07/25/15   Huel Cote, NP   BP 141/93 mmHg  Pulse 85  Temp(Src) 97.8 F (36.6 C) (Oral)  Resp 16  SpO2 100%  LMP 11/15/2015   Physical Exam  Constitutional: She is oriented to person, place, and time. She appears well-developed and well-nourished.  HENT:  Head: Normocephalic and atraumatic.  Eyes: Conjunctivae are normal. Pupils are equal, round, and reactive to light.  Right eye exhibits no discharge. Left eye exhibits no discharge. No scleral icterus.  Neck: Normal range of motion. No JVD present. No tracheal deviation present.  Pulmonary/Chest: Effort normal. No stridor.  Musculoskeletal:  Right lower extremity edema, this is baseline for patient, unchanged. No swelling or edema noted to the left lower extremity or foot. Patient has reported tenderness to palpation diffusely over bilateral legs in all areas, patient has pain to even light sensation. She has full active range of motion, no swelling of the joints, no redness, warmth, pedal pulses 2+, patient intermittently with antalgic gait  Neurological: She is alert and oriented to person, place, and time. Coordination normal.  Psychiatric: She has a normal mood and affect. Her behavior is normal. Judgment and thought content normal.  Nursing note and vitals reviewed.   ED Course  Procedures (including critical care time) Labs Review Labs Reviewed - No data to display  Imaging Review No results found. I have personally reviewed and evaluated these images and lab results as part of my medical decision-making.   EKG Interpretation None      MDM   Final diagnoses:  Musculoskeletal pain    Labs:  Imaging:  Consults:  Therapeutics:  Discharge Meds:   Assessment/Plan: 45 year old female presents today with bilateral leg and foot pain. She does have a history of DVT in the right lower extremity, she denies any change in swelling or edema. Patient has no swelling or edema to the left leg, she is taking her Xarelto as directed. Agents pain is diffuse, to even light palpation. Patient reports that she was at work walking a lot yesterday, as is likely the source of patient's discomfort. Patient reports that this happens from time to time, but today was more severe. She has no infectious etiology on today's exam, requesting note for work. Patient is artery on Xarelto, no indication for ultrasound of  the swollen right leg. She denies any chest pain, shortness of breath, or any other signs or symptoms of pulmonary embolus. No swelling or edema to the left likely would indicate DVT in this leg. Patient will be discharged home with instructions to continue using Xarelto, continue Tylenol, and follow with her primary care provider for reevaluation and further management. Patient is given strict return precautions, she verbalized understanding and agreement to today's plan had no further questions or concerns at time of discharge.        Okey Regal, PA-C 11/17/15 [place], MD 11/17/15 [phone]

## 2015-12-04 ENCOUNTER — Encounter: Payer: Self-pay | Admitting: Internal Medicine

## 2015-12-04 ENCOUNTER — Ambulatory Visit (INDEPENDENT_AMBULATORY_CARE_PROVIDER_SITE_OTHER): Payer: BLUE CROSS/BLUE SHIELD | Admitting: Internal Medicine

## 2015-12-04 VITALS — BP 118/82 | HR 97 | Temp 98.8°F | Resp 18 | Ht 64.0 in | Wt 173.1 lb

## 2015-12-04 DIAGNOSIS — L03319 Cellulitis of trunk, unspecified: Secondary | ICD-10-CM | POA: Diagnosis not present

## 2015-12-04 DIAGNOSIS — L02219 Cutaneous abscess of trunk, unspecified: Secondary | ICD-10-CM

## 2015-12-04 MED ORDER — PROMETHAZINE HCL 12.5 MG PO TABS: 12.5000 mg | ORAL_TABLET | Freq: Four times a day (QID) | ORAL | Status: DC | PRN

## 2015-12-04 MED ORDER — AMOXICILLIN-POT CLAVULANATE 875-125 MG PO TABS: 1.0000 | ORAL_TABLET | Freq: Two times a day (BID) | ORAL | Status: AC

## 2015-12-04 MED ORDER — OXYCODONE HCL 5 MG PO TABS
5.0000 mg | ORAL_TABLET | ORAL | Status: DC | PRN
Start: 2015-12-04 — End: 2015-12-18

## 2015-12-04 NOTE — Patient Instructions (Signed)
Incision and Drainage Incision and drainage is a procedure in which a sac-like structure (cystic structure) is opened and drained. The area to be drained usually contains material such as pus, fluid, or blood.  LET YOUR CAREGIVER KNOW ABOUT:   Allergies to medicine.  Medicines taken, including vitamins, herbs, eyedrops, over-the-counter medicines, and creams.  Use of steroids (by mouth or creams).  Previous problems with anesthetics or numbing medicines.  History of bleeding problems or blood clots.  Previous surgery.  Other health problems, including diabetes and kidney problems.  Possibility of pregnancy, if this applies. RISKS AND COMPLICATIONS  Pain.  Bleeding.  Scarring.  Infection. BEFORE THE PROCEDURE  You may need to have an ultrasound or other imaging tests to see how large or deep your cystic structure is. Blood tests may also be used to determine if you have an infection or how severe the infection is. You may need to have a tetanus shot. PROCEDURE  The affected area is cleaned with a cleaning fluid. The cyst area will then be numbed with a medicine (local anesthetic). A small incision will be made in the cystic structure. A syringe or catheter may be used to drain the contents of the cystic structure, or the contents may be squeezed out. The area will then be flushed with a cleansing solution. After cleansing the area, it is often gently packed with a gauze or another wound dressing. Once it is packed, it will be covered with gauze and tape or some other type of wound dressing. AFTER THE PROCEDURE   Often, you will be allowed to go home right after the procedure.  You may be given antibiotic medicine to prevent or heal an infection.  If the area was packed with gauze or some other wound dressing, you will likely need to come back in 1 to 2 days to get it removed.  The area should heal in about 14 days.   This information is not intended to replace advice given  to you by your health care provider. Make sure you discuss any questions you have with your health care provider.   Document Released: 01/12/2001 Document Revised: 01/18/2012 Document Reviewed: 09/13/2011 Elsevier Interactive Patient Education [employer].

## 2015-12-04 NOTE — Progress Notes (Signed)
Pre visit review using our clinic review tool, if applicable. No additional management support is needed unless otherwise documented below in the visit note. 

## 2015-12-05 ENCOUNTER — Other Ambulatory Visit: Payer: BLUE CROSS/BLUE SHIELD

## 2015-12-05 DIAGNOSIS — L03319 Cellulitis of trunk, unspecified: Principal | ICD-10-CM

## 2015-12-05 DIAGNOSIS — L02219 Cutaneous abscess of trunk, unspecified: Secondary | ICD-10-CM

## 2015-12-05 NOTE — Progress Notes (Signed)
Subjective:  Patient ID: [name], female    DOB: [birth year]  Age: 45 y.o. MRN: [id number]  CC: Acute Visit and Abscess   HPI [name] presents for The complaint of a painful, enlarging cyst on her left mid back over the last week. She tells me she has had a cyst there before that has been drained. The area feels red and swollen. She denies fever, chills, lymphadenopathy, rash, nausea, vomiting, abdominal pain.  Outpatient Prescriptions Prior to Visit  Medication Sig Dispense Refill  . acetaminophen (TYLENOL) 325 MG tablet Take 325 mg by mouth daily as needed for moderate pain. For pain    . albuterol (PROVENTIL HFA;VENTOLIN HFA) 108 (90 BASE) MCG/ACT inhaler Inhale 2 puffs into the lungs daily as needed for shortness of breath. For shortness of breath or wheezing 1 Inhaler 5  . fluticasone (FLONASE) 50 MCG/ACT nasal spray Place 2 sprays into both nostrils daily. 16 g 11  . Multiple Vitamin (MULTIVITAMIN) tablet Take 1 tablet by mouth daily.    . rivaroxaban (XARELTO) 20 MG TABS tablet Take 1 tablet (20 mg total) by mouth daily. TAKE 1 TABLET (20 MG TOTAL) BY MOUTH EVERY MORNING. 30 tablet 11  . terconazole (TERAZOL 3) 0.8 % vaginal cream Place 1 applicator vaginally at bedtime. (Patient not taking: Reported on 12/04/2015) 20 g 1   No facility-administered medications prior to visit.    ROS Review of Systems  Constitutional: Negative for fever, chills, diaphoresis, appetite change and fatigue.  HENT: Negative.   Eyes: Negative.   Respiratory: Negative.  Negative for cough, choking, shortness of breath and stridor.   Cardiovascular: Negative.  Negative for chest pain, palpitations and leg swelling.  Gastrointestinal: Negative.  Negative for nausea, vomiting, abdominal pain and diarrhea.  Endocrine: Negative.   Genitourinary: Negative.   Musculoskeletal: Negative.   Skin: Negative.   Allergic/Immunologic: Negative.   Neurological: Negative.   Hematological: Negative.   Negative for adenopathy. Does not bruise/bleed easily.  Psychiatric/Behavioral: Negative.     Objective:  BP 118/82 mmHg  Pulse 97  Temp(Src) 98.8 F (37.1 C) (Oral)  Resp 18  Ht 5' 4"  (1.626 m)  Wt 173 lb 1.9 oz (78.527 kg)  BMI 29.70 kg/m2  SpO2 97%  LMP 11/15/2015  BP Readings from Last 3 Encounters:  12/04/15 118/82  11/17/15 128/78  11/17/15 141/93    Wt Readings from Last 3 Encounters:  12/04/15 173 lb 1.9 oz (78.527 kg)  11/17/15 176 lb (79.833 kg)  09/18/15 177 lb (80.287 kg)    Physical Exam  Constitutional: She is oriented to person, place, and time. She appears well-developed and well-nourished.  Non-toxic appearance. She does not have a sickly appearance. She does not appear ill. She appears distressed.  She is grimacing with pain  HENT:  Mouth/Throat: Oropharynx is clear and moist. No oropharyngeal exudate.  Eyes: Conjunctivae are normal. Right eye exhibits no discharge. Left eye exhibits no discharge. No scleral icterus.  Neck: Normal range of motion. Neck supple. No JVD present. No tracheal deviation present. No thyromegaly present.  Cardiovascular: Normal rate, regular rhythm, normal heart sounds and intact distal pulses.  Exam reveals no gallop and no friction rub.   No murmur heard. Pulmonary/Chest: Effort normal and breath sounds normal. No stridor. No respiratory distress. She has no wheezes. She has no rales. She exhibits no tenderness.  Abdominal: Soft. Bowel sounds are normal. She exhibits no distension and no mass. There is no tenderness. There is no rebound and no guarding.  Musculoskeletal: Normal range of motion. She exhibits no edema or tenderness.       Back:  Area was cleaned with Betadine and prepped and draped in sterile fashion, local anesthesia was obtained with 3 mL of 2% lido w epi. A 6 mm punch incision was made and a large quantity of necrotic cyst with copious amounts of foul-smelling but not purulent exudate was immediately released.  The cavity was explored with Q-tips and hydrogen peroxide and several deep loculations and tracts were disrupted and irrigated. She did experience some discomfort with this but overall tolerated it well. There is very minimal blood loss. The cavity was packed iodoform and a dressing was applied. Hemostasis was obtained.  Lymphadenopathy:    She has no cervical adenopathy.  Neurological: She is oriented to person, place, and time.  Skin: Skin is warm. No rash noted. She is not diaphoretic. There is erythema. No pallor.  Vitals reviewed.   Lab Results  Component Value Date   WBC 5.1 06/30/2015   HGB 12.9 06/30/2015   HCT 39.3 06/30/2015   PLT 280.0 06/30/2015   GLUCOSE 80 06/30/2015   CHOL 201* 06/30/2015   TRIG 58.0 06/30/2015   HDL 53.20 06/30/2015   LDLCALC 136* 06/30/2015   ALT 17 06/30/2015   AST 23 06/30/2015   NA 137 06/30/2015   K 3.7 06/30/2015   CL 105 06/30/2015   CREATININE 0.95 06/30/2015   BUN 14 06/30/2015   CO2 25 06/30/2015   TSH 1.43 06/30/2015   INR 2.80 03/20/2012   HGBA1C 5.7 06/30/2015    No results found.  Assessment & Plan:   [name] was seen today for acute visit and abscess.  Diagnoses and all orders for this visit:  Cellulitis and abscess of trunk- incision and drainage and packing were successfully completed, she appears to have a strep or anaerobic infection so will treat with Augmentin, the exudate was not purulent so I don't think she has a staph infection, culture is pending, will adjust the antibiotic regimen based on culture results. She will return in 2 days for packing removal and reevaluation. She will take oxycodone as needed for pain and she will use Phenergan as needed for any medication side effects such as nausea or vomiting. -     Wound culture; Future -     amoxicillin-clavulanate (AUGMENTIN) 875-125 MG tablet; Take 1 tablet by mouth 2 (two) times daily. -     oxyCODONE (OXY IR/ROXICODONE) 5 MG immediate release tablet; Take 1  tablet (5 mg total) by mouth every 4 (four) hours as needed for severe pain. -     promethazine (PHENERGAN) 12.5 MG tablet; Take 1 tablet (12.5 mg total) by mouth every 6 (six) hours as needed for nausea or vomiting.   I have discontinued [name]'s terconazole. I am also having her start on amoxicillin-clavulanate, oxyCODONE, and promethazine. Additionally, I am having her maintain her acetaminophen, albuterol, multivitamin, rivaroxaban, and fluticasone.  Meds ordered this encounter  Medications  . amoxicillin-clavulanate (AUGMENTIN) 875-125 MG tablet    Sig: Take 1 tablet by mouth 2 (two) times daily.    Dispense:  20 tablet    Refill:  0  . oxyCODONE (OXY IR/ROXICODONE) 5 MG immediate release tablet    Sig: Take 1 tablet (5 mg total) by mouth every 4 (four) hours as needed for severe pain.    Dispense:  30 tablet    Refill:  0  . promethazine (PHENERGAN) 12.5 MG tablet    Sig:  Take 1 tablet (12.5 mg total) by mouth every 6 (six) hours as needed for nausea or vomiting.    Dispense:  30 tablet    Refill:  0     Follow-up: Return in about 2 days (around 12/06/2015).  Scarlette Calico, MD

## 2015-12-06 ENCOUNTER — Encounter: Payer: Self-pay | Admitting: Family Medicine

## 2015-12-06 ENCOUNTER — Ambulatory Visit (INDEPENDENT_AMBULATORY_CARE_PROVIDER_SITE_OTHER): Payer: BLUE CROSS/BLUE SHIELD | Admitting: Family Medicine

## 2015-12-06 VITALS — BP 128/80 | Temp 98.3°F | Ht 64.0 in | Wt 175.0 lb

## 2015-12-06 DIAGNOSIS — L03319 Cellulitis of trunk, unspecified: Secondary | ICD-10-CM

## 2015-12-06 DIAGNOSIS — L02219 Cutaneous abscess of trunk, unspecified: Secondary | ICD-10-CM | POA: Diagnosis not present

## 2015-12-06 NOTE — Progress Notes (Signed)
Pre visit review using our clinic review tool, if applicable. No additional management support is needed unless otherwise documented below in the visit note. 

## 2015-12-06 NOTE — Patient Instructions (Addendum)
Use gaze and a non stick bandage and tape to dress wound as needed Continue the antibiotic as directed Pain medicine as needed  If fever or suddenly worse symptoms- let us know and go to the ED Follow up with your regular doctor (Dr Ronnald Ramp)  on Monday for re check

## 2015-12-06 NOTE — Assessment & Plan Note (Addendum)
Left mid back - abscess is softer and only minimally red / but still tender  Packing removed from small incision followed by some scant liquid drainage , did not re pack wound  Thick gauze dressing applied and covered- and taped on with relief  Wound care discussed  She has someone at home to re dress  Adv to continue abx and pain med  F/u Monday for wound re check  Wound cx is still pending

## 2015-12-06 NOTE — Progress Notes (Signed)
Subjective:    Patient ID: [name], female    DOB: Mar 18, 1971, 45 y.o.   MRN: [id number]  HPI Here with an abscess on her back  Needs packing removed today No drainage from packing that she can tell  Has taken augmentin and pain medication  Has not needed nausea medication   No nausea No fever  Not a lot of change in symptoms except mildly less painful   Patient Active Problem List   Diagnosis Date Noted  . Cellulitis and abscess of trunk 12/06/2015  . Lower extremity pain, bilateral 11/17/2015  . Prediabetes 06/30/2015  . Allergic rhinitis 03/11/2012  . Antiphospholipid syndrome ([place]) 05/24/2011  . Hepatitis B infection   . CTEPH (chronic thromboembolic pulmonary hypertension) ([place]) 10/05/2010  . Long term current use of anticoagulant 10/05/2010  . Preventative health care 09/28/2010   Past Medical History  Diagnosis Date  . Pulmonary embolism ([place]) 2005       . CTEPH (chronic thromboembolic pulmonary hypertension) ([place])   . Primary hypercoagulable state ([place])   . Hepatitis B infection     Hep B core Ab 04/2005  . DVT (deep venous thrombosis) ([place])   . Seasonal allergies    Past Surgical History  Procedure Laterality Date  . Pulmonary artery endarterectomy  2007  . Cardiac catheterization  11/2005    R heart cath : Severe pulmonary arterial HTN with evidence of cor pulmonale. Vasodilatory challenge not performed.  Lynelle Smoke cava filter placement     Social History  Substance Use Topics  . Smoking status: Never Smoker   . Smokeless tobacco: Never Used  . Alcohol Use: No   Family History  Problem Relation Age of Onset  . Hypertension Mother   . Hypertension Father     deceased  . Alcohol abuse Neg Hx   . Cancer Neg Hx   . Diabetes Neg Hx   . Early death Neg Hx   . Heart disease Neg Hx   . Hyperlipidemia Neg Hx   . Kidney disease Neg Hx   . Stroke Neg Hx    Allergies  Allergen Reactions  . Aspirin Other (See Comments)    Other Reaction: GI  Upset Since being in [place], told she has an ulcer   Current Outpatient Prescriptions on File Prior to Visit  Medication Sig Dispense Refill  . acetaminophen (TYLENOL) 325 MG tablet Take 325 mg by mouth daily as needed for moderate pain. For pain    . albuterol (PROVENTIL HFA;VENTOLIN HFA) 108 (90 BASE) MCG/ACT inhaler Inhale 2 puffs into the lungs daily as needed for shortness of breath. For shortness of breath or wheezing 1 Inhaler 5  . amoxicillin-clavulanate (AUGMENTIN) 875-125 MG tablet Take 1 tablet by mouth 2 (two) times daily. 20 tablet 0  . fluticasone (FLONASE) 50 MCG/ACT nasal spray Place 2 sprays into both nostrils daily. 16 g 11  . Multiple Vitamin (MULTIVITAMIN) tablet Take 1 tablet by mouth daily.    Marland Kitchen oxyCODONE (OXY IR/ROXICODONE) 5 MG immediate release tablet Take 1 tablet (5 mg total) by mouth every 4 (four) hours as needed for severe pain. 30 tablet 0  . rivaroxaban (XARELTO) 20 MG TABS tablet Take 1 tablet (20 mg total) by mouth daily. TAKE 1 TABLET (20 MG TOTAL) BY MOUTH EVERY MORNING. 30 tablet 11  . promethazine (PHENERGAN) 12.5 MG tablet Take 1 tablet (12.5 mg total) by mouth every 6 (six) hours as needed for nausea or vomiting. (Patient not taking: Reported on 12/06/2015)  30 tablet 0   No current facility-administered medications on file prior to visit.    Review of Systems     Objective:   Physical Exam  Constitutional: She appears well-developed and well-nourished. No distress.  overwt and well appearing   Eyes: Conjunctivae and EOM are normal. Pupils are equal, round, and reactive to light.  Cardiovascular: Normal rate and regular rhythm.   Skin: Skin is warm and dry.  Abscess on L mid back - oval and soft with minimal erythema (no streaking) 3-4 cm of packing removed from small incision  Small amt of white drainage and blood present on packing material  Area is sensitive/tender Flushed/cleaned and dried  Bulky gauze dressing applied -asked pt to monitor for  drainage and change qd and prn    Psychiatric: She has a normal mood and affect.          Assessment & Plan:   Problem List Items Addressed This Visit      Other   Cellulitis and abscess of trunk - Primary    Left mid back - abscess is softer and only minimally red / but still tender  Packing removed from small incision followed by some scant liquid drainage , did not re pack wound  Thick gauze dressing applied and covered- and taped on with relief  Wound care discussed  She has someone at home to re dress  Adv to continue abx and pain med  F/u Monday for wound re check  Wound cx is still pending

## 2015-12-08 ENCOUNTER — Encounter: Payer: Self-pay | Admitting: Internal Medicine

## 2015-12-08 ENCOUNTER — Other Ambulatory Visit (INDEPENDENT_AMBULATORY_CARE_PROVIDER_SITE_OTHER): Payer: BLUE CROSS/BLUE SHIELD

## 2015-12-08 ENCOUNTER — Ambulatory Visit (INDEPENDENT_AMBULATORY_CARE_PROVIDER_SITE_OTHER): Payer: BLUE CROSS/BLUE SHIELD | Admitting: Internal Medicine

## 2015-12-08 VITALS — BP 120/60 | HR 79 | Temp 98.4°F | Wt 175.0 lb

## 2015-12-08 DIAGNOSIS — D6861 Antiphospholipid syndrome: Secondary | ICD-10-CM

## 2015-12-08 DIAGNOSIS — R7303 Prediabetes: Secondary | ICD-10-CM | POA: Diagnosis not present

## 2015-12-08 DIAGNOSIS — L02219 Cutaneous abscess of trunk, unspecified: Secondary | ICD-10-CM

## 2015-12-08 DIAGNOSIS — L03319 Cellulitis of trunk, unspecified: Secondary | ICD-10-CM

## 2015-12-08 LAB — BASIC METABOLIC PANEL
BUN: 10 mg/dL (ref 6–23)
CALCIUM: 9.5 mg/dL (ref 8.4–10.5)
CHLORIDE: 103 meq/L (ref 96–112)
CO2: 27 meq/L (ref 19–32)
CREATININE: 0.88 mg/dL (ref 0.40–1.20)
GFR: 73.21 mL/min (ref >60.00)
GLUCOSE: 98 mg/dL (ref 70–99)
Potassium: 3.5 mEq/L (ref 3.5–5.1)
SODIUM: 136 meq/L (ref 135–145)

## 2015-12-08 LAB — CBC WITH DIFFERENTIAL/PLATELET
BASOS ABS: 0.1 10*3/uL (ref 0.0–0.1)
Basophils Relative: 1.3 % (ref 0.0–3.0)
EOS PCT: 2.4 % (ref 0.0–5.0)
Eosinophils Absolute: 0.1 10*3/uL (ref 0.0–0.7)
HCT: 33.2 % — ABNORMAL LOW (ref 36.0–46.0)
HEMOGLOBIN: 10.7 g/dL — AB (ref 12.0–15.0)
Lymphocytes Relative: 39 % (ref 12.0–46.0)
Lymphs Abs: 1.9 10*3/uL (ref 0.7–4.0)
MCHC: 32.1 g/dL (ref 30.0–36.0)
MCV: 72.3 fl — AB (ref 78.0–100.0)
MONOS PCT: 14.5 % — AB (ref 3.0–12.0)
Monocytes Absolute: 0.7 10*3/uL (ref 0.1–1.0)
Neutro Abs: 2.1 10*3/uL (ref 1.4–7.7)
Neutrophils Relative %: 42.8 % — ABNORMAL LOW (ref 43.0–77.0)
Platelets: 332 10*3/uL (ref 150.0–400.0)
RBC: 4.59 Mil/uL (ref 3.87–5.11)
RDW: 18 % — ABNORMAL HIGH (ref 11.5–15.5)
WBC: 4.9 10*3/uL (ref 4.0–10.5)

## 2015-12-08 NOTE — Assessment & Plan Note (Signed)
BMET 

## 2015-12-08 NOTE — Progress Notes (Signed)
   Subjective:  Patient ID: [name], female    DOB: Sep 17, 1970  Age: 45 y.o. MRN: [id number]  CC: No chief complaint on file.   HPI [name] presents for the abscess f/u. A little better... She is leaving for [place] this week  Outpatient Prescriptions Prior to Visit  Medication Sig Dispense Refill  . acetaminophen (TYLENOL) 325 MG tablet Take 325 mg by mouth daily as needed for moderate pain. For pain    . albuterol (PROVENTIL HFA;VENTOLIN HFA) 108 (90 BASE) MCG/ACT inhaler Inhale 2 puffs into the lungs daily as needed for shortness of breath. For shortness of breath or wheezing 1 Inhaler 5  . amoxicillin-clavulanate (AUGMENTIN) 875-125 MG tablet Take 1 tablet by mouth 2 (two) times daily. 20 tablet 0  . fluticasone (FLONASE) 50 MCG/ACT nasal spray Place 2 sprays into both nostrils daily. 16 g 11  . Multiple Vitamin (MULTIVITAMIN) tablet Take 1 tablet by mouth daily.    Marland Kitchen oxyCODONE (OXY IR/ROXICODONE) 5 MG immediate release tablet Take 1 tablet (5 mg total) by mouth every 4 (four) hours as needed for severe pain. 30 tablet 0  . promethazine (PHENERGAN) 12.5 MG tablet Take 1 tablet (12.5 mg total) by mouth every 6 (six) hours as needed for nausea or vomiting. 30 tablet 0  . rivaroxaban (XARELTO) 20 MG TABS tablet Take 1 tablet (20 mg total) by mouth daily. TAKE 1 TABLET (20 MG TOTAL) BY MOUTH EVERY MORNING. 30 tablet 11   No facility-administered medications prior to visit.    ROS Review of Systems  Constitutional: Negative for fever and diaphoresis.  Respiratory: Negative for shortness of breath.   Skin: Positive for wound.    Objective:  BP 120/60 mmHg  Pulse 79  Temp(Src) 98.4 F (36.9 C) (Oral)  Wt 175 lb (79.379 kg)  SpO2 99%  LMP 11/15/2015  BP Readings from Last 3 Encounters:  12/08/15 120/60  12/06/15 128/80  12/04/15 118/82    Wt Readings from Last 3 Encounters:  12/08/15 175 lb (79.379 kg)  12/06/15 175 lb (79.379 kg)  12/04/15 173 lb 1.9 oz  (78.527 kg)    Physical Exam  Constitutional: No distress.  Neurological: Coordination normal.  Skin: There is erythema.  3 mm wound opening on L post chest wall Firm infiltrate 4x7 cm underneath - tender; erythema present over the swelling  Lab Results  Component Value Date   WBC 5.1 06/30/2015   HGB 12.9 06/30/2015   HCT 39.3 06/30/2015   PLT 280.0 06/30/2015   GLUCOSE 80 06/30/2015   CHOL 201* 06/30/2015   TRIG 58.0 06/30/2015   HDL 53.20 06/30/2015   LDLCALC 136* 06/30/2015   ALT 17 06/30/2015   AST 23 06/30/2015   NA 137 06/30/2015   K 3.7 06/30/2015   CL 105 06/30/2015   CREATININE 0.95 06/30/2015   BUN 14 06/30/2015   CO2 25 06/30/2015   TSH 1.43 06/30/2015   INR 2.80 03/20/2012   HGBA1C 5.7 06/30/2015    No results found.  Assessment & Plan:   Diagnoses and all orders for this visit:  Cellulitis and abscess of trunk   I am having [name] maintain her acetaminophen, albuterol, multivitamin, rivaroxaban, fluticasone, amoxicillin-clavulanate, oxyCODONE, and promethazine.  No orders of the defined types were placed in this encounter.     Follow-up: Return for a follow-up visit.  Walker Kehr, MD

## 2015-12-08 NOTE — Assessment & Plan Note (Signed)
On Xarelto 

## 2015-12-08 NOTE — Progress Notes (Signed)
Pre visit review using our clinic review tool, if applicable. No additional management support is needed unless otherwise documented below in the visit note. 

## 2015-12-08 NOTE — Assessment & Plan Note (Addendum)
5/17 L post thor spine abscess, s/p I&D Slow healing process Cx(-) I suggested to repeat I&D of her abscess today - the pt refused Wound was dressed. CBC ordered

## 2015-12-09 ENCOUNTER — Telehealth: Payer: Self-pay | Admitting: Internal Medicine

## 2015-12-09 ENCOUNTER — Telehealth: Payer: Self-pay

## 2015-12-09 LAB — WOUND CULTURE
GRAM STAIN: NONE SEEN
Organism ID, Bacteria: NO GROWTH

## 2015-12-09 NOTE — Telephone Encounter (Signed)
Please follow back up with in regards to labs

## 2015-12-09 NOTE — Telephone Encounter (Signed)
No change in abx.  I would do another I&D if not better Thx

## 2015-12-09 NOTE — Telephone Encounter (Signed)
Pt wound CX has resulted. It was preliminary during OV with you on 5/8. Does she need additional course of abx? PCP will be out of office and pt is going out of country soon. Please advise

## 2015-12-09 NOTE — Telephone Encounter (Signed)
Did notify of md response and scheduled follow up with Dr. Ronnald Ramp. Please follow up with patient to make sure she does not have any more questions.

## 2015-12-10 NOTE — Telephone Encounter (Signed)
LMVOM to call the ofice back. Verifying pt will make it ot f/u appt with PCP on 5/18

## 2015-12-18 ENCOUNTER — Encounter: Payer: Self-pay | Admitting: Internal Medicine

## 2015-12-18 ENCOUNTER — Ambulatory Visit (INDEPENDENT_AMBULATORY_CARE_PROVIDER_SITE_OTHER): Payer: BLUE CROSS/BLUE SHIELD | Admitting: Internal Medicine

## 2015-12-18 ENCOUNTER — Encounter (INDEPENDENT_AMBULATORY_CARE_PROVIDER_SITE_OTHER): Payer: BLUE CROSS/BLUE SHIELD

## 2015-12-18 VITALS — BP 120/82 | HR 87 | Temp 100.0°F | Ht 64.0 in | Wt 177.0 lb

## 2015-12-18 DIAGNOSIS — L03319 Cellulitis of trunk, unspecified: Secondary | ICD-10-CM

## 2015-12-18 DIAGNOSIS — B181 Chronic viral hepatitis B without delta-agent: Secondary | ICD-10-CM | POA: Diagnosis not present

## 2015-12-18 DIAGNOSIS — L02219 Cutaneous abscess of trunk, unspecified: Secondary | ICD-10-CM | POA: Diagnosis not present

## 2015-12-18 DIAGNOSIS — D509 Iron deficiency anemia, unspecified: Secondary | ICD-10-CM

## 2015-12-18 DIAGNOSIS — D539 Nutritional anemia, unspecified: Secondary | ICD-10-CM | POA: Diagnosis not present

## 2015-12-18 LAB — CBC WITH DIFFERENTIAL/PLATELET
BASOS PCT: 1.9 % (ref 0.0–3.0)
Basophils Absolute: 0.1 10*3/uL (ref 0.0–0.1)
EOS PCT: 1.1 % (ref 0.0–5.0)
Eosinophils Absolute: 0.1 10*3/uL (ref 0.0–0.7)
HCT: 31 % — ABNORMAL LOW (ref 36.0–46.0)
Hemoglobin: 10.2 g/dL — ABNORMAL LOW (ref 12.0–15.0)
LYMPHS ABS: 1.6 10*3/uL (ref 0.7–4.0)
Lymphocytes Relative: 34.4 % (ref 12.0–46.0)
MCHC: 32.8 g/dL (ref 30.0–36.0)
MCV: 71 fl — AB (ref 78.0–100.0)
MONO ABS: 0.5 10*3/uL (ref 0.1–1.0)
MONOS PCT: 11.9 % (ref 3.0–12.0)
NEUTROS PCT: 50.7 % (ref 43.0–77.0)
Neutro Abs: 2.3 10*3/uL (ref 1.4–7.7)
Platelets: 416 10*3/uL — ABNORMAL HIGH (ref 150.0–400.0)
RBC: 4.37 Mil/uL (ref 3.87–5.11)
RDW: 17.8 % — AB (ref 11.5–15.5)
WBC: 4.6 10*3/uL (ref 4.0–10.5)

## 2015-12-18 LAB — FERRITIN: Ferritin: 3.9 ng/mL — ABNORMAL LOW (ref 10.0–291.0)

## 2015-12-18 LAB — IBC PANEL
Iron: 21 ug/dL — ABNORMAL LOW (ref 42–145)
SATURATION RATIOS: 3.7 % — AB (ref 20.0–50.0)
Transferrin: 401 mg/dL — ABNORMAL HIGH (ref 212.0–360.0)

## 2015-12-18 LAB — VITAMIN B12: VITAMIN B 12: 861 pg/mL (ref 211–911)

## 2015-12-18 LAB — FOLATE

## 2015-12-18 LAB — HCG, QUANTITATIVE, PREGNANCY: QUANTITATIVE HCG: 0.49 m[IU]/mL

## 2015-12-18 MED ORDER — FERRALET 90 90-1 MG PO TABS: 1.0000 | ORAL_TABLET | Freq: Every day | ORAL | Status: DC

## 2015-12-18 MED ORDER — AMOXICILLIN-POT CLAVULANATE 875-125 MG PO TABS: 1.0000 | ORAL_TABLET | Freq: Two times a day (BID) | ORAL | Status: DC

## 2015-12-18 NOTE — Progress Notes (Signed)
Pre visit review using our clinic review tool, if applicable. No additional management support is needed unless otherwise documented below in the visit note. 

## 2015-12-18 NOTE — Progress Notes (Signed)
Subjective:  Patient ID: [name], female    DOB: 11/18/1970  Age: 45 y.o. MRN: [id number]  CC: Anemia and Abscess   HPI [name] presents for a recheck of an abscess on her left mid back. She tells me that it is improving but has not totally resolved. She is also concerned about her recent lab tests that showed she is anemic. She does admit to having menstrual cycles that last 5 days and for 3 of those days she has heavy bleeding. She saw her gynecologist about 5 months ago and because she is hypercoagulable they did not offer any treatment options to reduce her menstruation flow.  Outpatient Prescriptions Prior to Visit  Medication Sig Dispense Refill  . acetaminophen (TYLENOL) 325 MG tablet Take 325 mg by mouth daily as needed for moderate pain. For pain    . albuterol (PROVENTIL HFA;VENTOLIN HFA) 108 (90 BASE) MCG/ACT inhaler Inhale 2 puffs into the lungs daily as needed for shortness of breath. For shortness of breath or wheezing 1 Inhaler 5  . fluticasone (FLONASE) 50 MCG/ACT nasal spray Place 2 sprays into both nostrils daily. 16 g 11  . Multiple Vitamin (MULTIVITAMIN) tablet Take 1 tablet by mouth daily.    . rivaroxaban (XARELTO) 20 MG TABS tablet Take 1 tablet (20 mg total) by mouth daily. TAKE 1 TABLET (20 MG TOTAL) BY MOUTH EVERY MORNING. 30 tablet 11  . oxyCODONE (OXY IR/ROXICODONE) 5 MG immediate release tablet Take 1 tablet (5 mg total) by mouth every 4 (four) hours as needed for severe pain. 30 tablet 0  . promethazine (PHENERGAN) 12.5 MG tablet Take 1 tablet (12.5 mg total) by mouth every 6 (six) hours as needed for nausea or vomiting. 30 tablet 0   No facility-administered medications prior to visit.    ROS Review of Systems  Constitutional: Positive for fatigue. Negative for fever, chills, diaphoresis and appetite change.  HENT: Negative.   Eyes: Negative.   Respiratory: Negative.  Negative for cough, choking, chest tightness, shortness of breath and  stridor.   Cardiovascular: Negative.  Negative for chest pain, palpitations and leg swelling.  Gastrointestinal: Negative.  Negative for nausea, vomiting, diarrhea, constipation and blood in stool.  Endocrine: Negative.   Genitourinary: Positive for vaginal bleeding.  Musculoskeletal: Negative.  Negative for myalgias, back pain, joint swelling and arthralgias.  Skin: Negative.  Negative for color change and rash.  Allergic/Immunologic: Negative.   Neurological: Negative.  Negative for dizziness, tremors, light-headedness and numbness.  Hematological: Negative.  Negative for adenopathy. Does not bruise/bleed easily.  Psychiatric/Behavioral: Negative.     Objective:  BP 120/82 mmHg  Pulse 87  Temp(Src) 100 F (37.8 C) (Oral)  Ht 5' 4"  (1.626 m)  Wt 177 lb (80.287 kg)  BMI 30.37 kg/m2  SpO2 97%  LMP 12/08/2015  BP Readings from Last 3 Encounters:  12/18/15 120/82  12/08/15 120/60  12/06/15 128/80    Wt Readings from Last 3 Encounters:  12/18/15 177 lb (80.287 kg)  12/08/15 175 lb (79.379 kg)  12/06/15 175 lb (79.379 kg)    Physical Exam  Constitutional: She is oriented to person, place, and time. She appears well-developed and well-nourished. No distress.  HENT:  Mouth/Throat: Oropharynx is clear and moist. No oropharyngeal exudate.  Eyes: Conjunctivae are normal. Right eye exhibits no discharge. Left eye exhibits no discharge. No scleral icterus.  Neck: Normal range of motion. Neck supple. No JVD present. No tracheal deviation present. No thyromegaly present.  Cardiovascular: Normal rate, regular rhythm,  normal heart sounds and intact distal pulses.  Exam reveals no gallop and no friction rub.   No murmur heard. Pulmonary/Chest: Effort normal and breath sounds normal. No stridor. No respiratory distress. She has no wheezes. She has no rales.   She exhibits no tenderness.  The opening was explored and additional contents of necrotic EIC were removed. There was no  additional exudate. The previous culture showed some gram-negative rods so another culture was sent. I will continue Augmentin for now in the event that she has persistent infection/cellulitis. She was not willing for me to do another incision and drainage with packing of the area.  Abdominal: Soft. Bowel sounds are normal. She exhibits no distension and no mass. There is no tenderness. There is no rebound and no guarding.  Musculoskeletal: Normal range of motion. She exhibits no edema or tenderness.  Lymphadenopathy:    She has no cervical adenopathy.  Neurological: She is oriented to person, place, and time.  Skin: Skin is warm and dry. No rash noted. She is not diaphoretic. No erythema. No pallor.  Vitals reviewed.   Lab Results  Component Value Date   WBC 4.6 12/18/2015   HGB 10.2* 12/18/2015   HCT 31.0* 12/18/2015   PLT 416.0* 12/18/2015   GLUCOSE 98 12/08/2015   CHOL 201* 06/30/2015   TRIG 58.0 06/30/2015   HDL 53.20 06/30/2015   LDLCALC 136* 06/30/2015   ALT 17 06/30/2015   AST 23 06/30/2015   NA 136 12/08/2015   K 3.5 12/08/2015   CL 103 12/08/2015   CREATININE 0.88 12/08/2015   BUN 10 12/08/2015   CO2 27 12/08/2015   TSH 1.43 06/30/2015   INR 2.80 03/20/2012   HGBA1C 5.7 06/30/2015    No results found.  Assessment & Plan:   [name] was seen today for anemia and abscess.  Diagnoses and all orders for this visit:  Cellulitis and abscess of trunk- Additional contents of necrotic cyst was removed today, she is not willing to undergo another incision and drainage with iodoform packing. Her previous culture showed gram-negative rods and I'm not convinced that the infection has been adequately treated so asked her take another course of Augmentin. Culture today was obtained. -     Wound culture; Future -     hCG, quantitative, pregnancy; Future -     amoxicillin-clavulanate (AUGMENTIN) 875-125 MG tablet; Take 1 tablet by mouth 2 (two) times daily.  Chronic viral  hepatitis B without delta agent and without coma ([place])- we'll monitor her liver enzymes and her hepatitis B DNA PCR, if it is above 2000 and she has chronically elevated liver enzymes and may consider referral for treatment of chronic hepatitis B. Will also screen for [facility] with an ultrasound of the liver and alpha-fetoprotein monitoring -     AFP tumor marker; Future -     Hepatitis B DNA, ultraquantitative, PCR; Future -     hCG, quantitative, pregnancy; Future -     US Abdomen Complete; Future -     Hepatic function panel; Future  Deficiency anemia- she remains anemic and has a low iron level, I've asked her to start iron replacement therapy. -     CBC with Differential/Platelet; Future -     hCG, quantitative, pregnancy; Future -     IBC panel; Future -     Folate; Future -     Ferritin; Future -     Vitamin B12; Future -     Discontinue: Fe Cbn-Fe Gluc-FA-B12-C-DSS [facility]  90) 90-1 MG TABS; Take 1 tablet by mouth daily.  Iron deficiency anemia- she will work with her gynecologist to get her heavy menses under control, for now will start iron replacement therapy. -     Discontinue: Fe Cbn-Fe Gluc-FA-B12-C-DSS (FERRALET 90) 90-1 MG TABS; Take 1 tablet by mouth daily. -     ferrous sulfate 325 (65 FE) MG tablet; Take 1 tablet (325 mg total) by mouth daily with breakfast.   I have discontinued [name]'s oxyCODONE, promethazine, and FERRALET 90. I am also having her start on amoxicillin-clavulanate and ferrous sulfate. Additionally, I am having her maintain her acetaminophen, albuterol, multivitamin, rivaroxaban, and fluticasone.  Meds ordered this encounter  Medications  . amoxicillin-clavulanate (AUGMENTIN) 875-125 MG tablet    Sig: Take 1 tablet by mouth 2 (two) times daily.    Dispense:  20 tablet    Refill:  0  . DISCONTD: Fe Cbn-Fe Gluc-FA-B12-C-DSS (FERRALET 90) 90-1 MG TABS    Sig: Take 1 tablet by mouth daily.    Dispense:  30 each    Refill:  11  . ferrous sulfate 325  (65 FE) MG tablet    Sig: Take 1 tablet (325 mg total) by mouth daily with breakfast.    Dispense:  60 tablet    Refill:  5     Follow-up: Return in about 2 weeks (around 01/01/2016).  Scarlette Calico, MD

## 2015-12-18 NOTE — Patient Instructions (Signed)

## 2015-12-19 ENCOUNTER — Telehealth: Payer: Self-pay | Admitting: Internal Medicine

## 2015-12-19 LAB — AFP TUMOR MARKER: AFP-Tumor Marker: 3.9 ng/mL (ref <6.1)

## 2015-12-19 NOTE — Telephone Encounter (Signed)
Please call pt back in regards to why she is having an US done

## 2015-12-19 NOTE — Telephone Encounter (Signed)
Pt informed

## 2015-12-21 LAB — WOUND CULTURE
GRAM STAIN: NONE SEEN
Gram Stain: NONE SEEN
Organism ID, Bacteria: NO GROWTH

## 2015-12-22 ENCOUNTER — Telehealth: Payer: Self-pay

## 2015-12-22 LAB — HEPATITIS B DNA, ULTRAQUANTITATIVE, PCR: Hepatitis B DNA: <20 IU/mL (ref <20)

## 2015-12-22 MED ORDER — FERROUS SULFATE 325 (65 FE) MG PO TABS: 325.0000 mg | ORAL_TABLET | Freq: Every day | ORAL | Status: DC

## 2015-12-22 NOTE — Telephone Encounter (Signed)
I assume this is relation to the iron supplement. I sent a generic option to her pharmacy. Please ask her to come back in and go to the lab to check her liver enzymes as it relates to her hepatitis B infection.

## 2015-12-22 NOTE — Telephone Encounter (Signed)
Pt informed this morning. She will be in on Wednesday.

## 2015-12-22 NOTE — Telephone Encounter (Signed)
Pt stated that she went to pick up the new medication and the price is too expensive.

## 2015-12-24 ENCOUNTER — Other Ambulatory Visit (INDEPENDENT_AMBULATORY_CARE_PROVIDER_SITE_OTHER): Payer: BLUE CROSS/BLUE SHIELD

## 2015-12-24 DIAGNOSIS — B181 Chronic viral hepatitis B without delta-agent: Secondary | ICD-10-CM

## 2015-12-24 LAB — HEPATIC FUNCTION PANEL
ALBUMIN: 4.1 g/dL (ref 3.5–5.2)
ALK PHOS: 63 U/L (ref 39–117)
ALT: 13 U/L (ref 0–35)
AST: 19 U/L (ref 0–37)
Bilirubin, Direct: 0.1 mg/dL (ref 0.0–0.3)
TOTAL PROTEIN: 7.6 g/dL (ref 6.0–8.3)
Total Bilirubin: 0.5 mg/dL (ref 0.2–1.2)

## 2015-12-26 ENCOUNTER — Ambulatory Visit (HOSPITAL_COMMUNITY)
Admission: RE | Admit: 2015-12-26 | Discharge: 2015-12-26 | Disposition: A | Discharge status: Home / Self Care | Payer: BLUE CROSS/BLUE SHIELD | Source: Ambulatory Visit | Attending: Internal Medicine | Admitting: Internal Medicine

## 2015-12-26 DIAGNOSIS — B181 Chronic viral hepatitis B without delta-agent: Secondary | ICD-10-CM | POA: Diagnosis present

## 2016-01-01 ENCOUNTER — Ambulatory Visit (INDEPENDENT_AMBULATORY_CARE_PROVIDER_SITE_OTHER): Payer: BLUE CROSS/BLUE SHIELD | Admitting: Internal Medicine

## 2016-01-01 ENCOUNTER — Encounter: Payer: Self-pay | Admitting: Internal Medicine

## 2016-01-01 VITALS — BP 118/80 | HR 86 | Temp 98.7°F | Resp 16 | Ht 64.0 in | Wt 179.0 lb

## 2016-01-01 DIAGNOSIS — D509 Iron deficiency anemia, unspecified: Secondary | ICD-10-CM

## 2016-01-01 DIAGNOSIS — L03319 Cellulitis of trunk, unspecified: Secondary | ICD-10-CM | POA: Diagnosis not present

## 2016-01-01 DIAGNOSIS — L02219 Cutaneous abscess of trunk, unspecified: Secondary | ICD-10-CM | POA: Diagnosis not present

## 2016-01-01 DIAGNOSIS — B169 Acute hepatitis B without delta-agent and without hepatic coma: Secondary | ICD-10-CM | POA: Diagnosis not present

## 2016-01-01 DIAGNOSIS — B1910 Unspecified viral hepatitis B without hepatic coma: Secondary | ICD-10-CM

## 2016-01-01 NOTE — Progress Notes (Signed)
Subjective:  Patient ID: [name], female    DOB: Dec 02, 1970  Age: 45 y.o. MRN: [id number]  CC: Anemia   HPI [name] presents for follow-up on anemia and to recheck the cyst on her left back. She underwent incision and drainage a few weeks ago for what appeared to be an EIC on her left back. She tells me that her symptoms have resolved and there is no pain, redness, swelling, or drainage from the area.  She also tells me that she is started taking her iron replacement therapy and denies any side effects such as constipation or abdominal pain.  Outpatient Prescriptions Prior to Visit  Medication Sig Dispense Refill  . acetaminophen (TYLENOL) 325 MG tablet Take 325 mg by mouth daily as needed for moderate pain. For pain    . albuterol (PROVENTIL HFA;VENTOLIN HFA) 108 (90 BASE) MCG/ACT inhaler Inhale 2 puffs into the lungs daily as needed for shortness of breath. For shortness of breath or wheezing 1 Inhaler 5  . amoxicillin-clavulanate (AUGMENTIN) 875-125 MG tablet Take 1 tablet by mouth 2 (two) times daily. 20 tablet 0  . ferrous sulfate 325 (65 FE) MG tablet Take 1 tablet (325 mg total) by mouth daily with breakfast. 60 tablet 5  . fluticasone (FLONASE) 50 MCG/ACT nasal spray Place 2 sprays into both nostrils daily. 16 g 11  . Multiple Vitamin (MULTIVITAMIN) tablet Take 1 tablet by mouth daily.    . rivaroxaban (XARELTO) 20 MG TABS tablet Take 1 tablet (20 mg total) by mouth daily. TAKE 1 TABLET (20 MG TOTAL) BY MOUTH EVERY MORNING. 30 tablet 11   No facility-administered medications prior to visit.    ROS Review of Systems  Constitutional: Positive for fatigue. Negative for fever, chills, diaphoresis and appetite change.  HENT: Negative.   Eyes: Negative.  Negative for visual disturbance.  Respiratory: Negative.  Negative for cough, choking, chest tightness, shortness of breath and stridor.   Cardiovascular: Negative.  Negative for chest pain, palpitations and leg  swelling.  Gastrointestinal: Negative.  Negative for nausea, vomiting, abdominal pain, diarrhea and constipation.  Endocrine: Negative.   Genitourinary: Negative.  Negative for difficulty urinating.  Musculoskeletal: Negative.  Negative for myalgias, back pain, arthralgias and neck pain.  Skin: Negative.  Negative for color change and pallor.  Allergic/Immunologic: Negative.   Neurological: Negative.  Negative for dizziness, tremors and numbness.  Hematological: Negative.  Negative for adenopathy. Does not bruise/bleed easily.  Psychiatric/Behavioral: Negative.     Objective:  BP 118/80 mmHg  Pulse 86  Temp(Src) 98.7 F (37.1 C) (Oral)  Resp 16  Ht 5' 4"  (1.626 m)  Wt 179 lb (81.194 kg)  BMI 30.71 kg/m2  SpO2 98%  LMP 12/08/2015  BP Readings from Last 3 Encounters:  01/01/16 118/80  12/18/15 120/82  12/08/15 120/60    Wt Readings from Last 3 Encounters:  01/01/16 179 lb (81.194 kg)  12/18/15 177 lb (80.287 kg)  12/08/15 175 lb (79.379 kg)    Physical Exam  Constitutional: She is oriented to person, place, and time. No distress.  HENT:  Head: Normocephalic and atraumatic.  Mouth/Throat: Oropharynx is clear and moist. No oropharyngeal exudate.  Eyes: Conjunctivae are normal. Right eye exhibits no discharge. Left eye exhibits no discharge. No scleral icterus.  Neck: Normal range of motion. Neck supple. No JVD present. No tracheal deviation present. No thyromegaly present.  Cardiovascular: Normal rate, regular rhythm, normal heart sounds and intact distal pulses.  Exam reveals no gallop and no friction rub.  No murmur heard. Pulmonary/Chest: Effort normal and breath sounds normal. No stridor. No respiratory distress. She has no wheezes. She has no rales. She exhibits no tenderness.  Abdominal: Soft. Bowel sounds are normal. She exhibits no distension and no mass. There is no tenderness. There is no rebound and no guarding.  Musculoskeletal: Normal range of motion. She  exhibits no edema or tenderness.       Arms: Lymphadenopathy:    She has no cervical adenopathy.  Neurological: She is oriented to person, place, and time.  Skin: Skin is warm and dry. No rash noted. She is not diaphoretic. No erythema. No pallor.  Vitals reviewed.   Lab Results  Component Value Date   WBC 4.6 12/18/2015   HGB 10.2* 12/18/2015   HCT 31.0* 12/18/2015   PLT 416.0* 12/18/2015   GLUCOSE 98 12/08/2015   CHOL 201* 06/30/2015   TRIG 58.0 06/30/2015   HDL 53.20 06/30/2015   LDLCALC 136* 06/30/2015   ALT 13 12/24/2015   AST 19 12/24/2015   NA 136 12/08/2015   K 3.5 12/08/2015   CL 103 12/08/2015   CREATININE 0.88 12/08/2015   BUN 10 12/08/2015   CO2 27 12/08/2015   TSH 1.43 06/30/2015   INR 2.80 03/20/2012   HGBA1C 5.7 06/30/2015    US Abdomen Complete  12/26/2015  CLINICAL DATA:  Chronic viral hepatitis-B without delta agent and without coma. Screen for hepatocellular carcinoma. EXAM: ABDOMEN ULTRASOUND COMPLETE COMPARISON:  CT abdomen pelvis 04/18/2014 FINDINGS: Gallbladder: No gallstones or wall thickening visualized. No sonographic Murphy sign noted by sonographer. Common bile duct: Diameter: 2.6 mm Liver: No focal lesion identified. Within normal limits in parenchymal echogenicity. IVC: No abnormality visualized. Pancreas: Visualized portion unremarkable. Spleen: Size and appearance within normal limits. Right Kidney: Length: 10.1 cm. Echogenicity within normal limits. No mass or hydronephrosis visualized. Left Kidney: Length: 10.3 cm. Echogenicity within normal limits. No mass or hydronephrosis visualized. Abdominal aorta: No aneurysm visualized. Other findings: None. IMPRESSION: Sonographically normal liver. No mass lesion. Negative for cholelithiasis. Electronically Signed   By: Franchot Gallo M.D.   On: 12/26/2015 13:14    Assessment & Plan:   [name] was seen today for anemia.  Diagnoses and all orders for this visit:  Hepatitis B virus infection,  unspecified chronicity- recent liver enzymes were normal, recent hepatitis B PCR was negative, liver ultrasound is normal, her hepatitis B infection infection has resolved with no evidence of hepatocellular carcinoma.  Iron deficiency anemia- she will continue iron replacement therapy.  Cellulitis and abscess of trunk- this has improved/resolved.   I am having [name] maintain her acetaminophen, albuterol, multivitamin, rivaroxaban, fluticasone, amoxicillin-clavulanate, ferrous sulfate, oxyCODONE, and promethazine.  Meds ordered this encounter  Medications  . oxyCODONE (OXY IR/ROXICODONE) 5 MG immediate release tablet    Sig:   . promethazine (PHENERGAN) 12.5 MG tablet    Sig:      Follow-up: Return in about 3 months (around 04/02/2016).  Scarlette Calico, MD

## 2016-01-01 NOTE — Progress Notes (Signed)
Pre visit review using our clinic review tool, if applicable. No additional management support is needed unless otherwise documented below in the visit note. 

## 2016-01-01 NOTE — Patient Instructions (Signed)
Iron Deficiency Anemia, Adult Anemia is a condition in which there are less red blood cells or hemoglobin in the blood than normal. Hemoglobin is the part of red blood cells that carries oxygen. Iron deficiency anemia is anemia caused by too little iron. It is the most common type of anemia. It may leave you tired and short of breath. CAUSES   Lack of iron in the diet.  Poor absorption of iron, as seen with intestinal disorders.  Intestinal bleeding.  Heavy periods. SIGNS AND SYMPTOMS  Mild anemia may not be noticeable. Symptoms may include:  Fatigue.  Headache.  Pale skin.  Weakness.  Tiredness.  Shortness of breath.  Dizziness.  Cold hands and feet.  Fast or irregular heartbeat. DIAGNOSIS  Diagnosis requires a thorough evaluation and physical exam by your health care provider. Blood tests are generally used to confirm iron deficiency anemia. Additional tests may be done to find the underlying cause of your anemia. These may include:  Testing for blood in the stool (fecal occult blood test).  A procedure to see inside the colon and rectum (colonoscopy).  A procedure to see inside the esophagus and stomach (endoscopy). TREATMENT  Iron deficiency anemia is treated by correcting the cause of the deficiency. Treatment may involve:  Adding iron-rich foods to your diet.  Taking iron supplements. Pregnant or breastfeeding women need to take extra iron because their normal diet usually does not provide the required amount.  Taking vitamins. Vitamin C improves the absorption of iron. Your health care provider may recommend that you take your iron tablets with a glass of orange juice or vitamin C supplement.  Medicines to make heavy menstrual flow lighter.  Surgery. HOME CARE INSTRUCTIONS   Take iron as directed by your health care provider.  If you cannot tolerate taking iron supplements by mouth, talk to your health care provider about taking them through a vein  (intravenously) or an injection into a muscle.  For the best iron absorption, iron supplements should be taken on an empty stomach. If you cannot tolerate them on an empty stomach, you may need to take them with food.  Do not drink milk or take antacids at the same time as your iron supplements. Milk and antacids may interfere with the absorption of iron.  Iron supplements can cause constipation. Make sure to include fiber in your diet to prevent constipation. A stool softener may also be recommended.  Take vitamins as directed by your health care provider.  Eat a diet rich in iron. Foods high in iron include liver, lean beef, whole-grain bread, eggs, dried fruit, and dark green leafy vegetables. SEEK IMMEDIATE MEDICAL CARE IF:   You faint. If this happens, do not drive. Call your local emergency services (911 in [place] if no other help is available.  You have chest pain.  You feel nauseous or vomit.  You have severe or increased shortness of breath with activity.  You feel weak.  You have a rapid heartbeat.  You have unexplained sweating.  You become light-headed when getting up from a chair or bed. MAKE SURE YOU:   Understand these instructions.  Will watch your condition.  Will get help right away if you are not doing well or get worse.   This information is not intended to replace advice given to you by your health care provider. Make sure you discuss any questions you have with your health care provider.   Document Released: 07/16/2000 Document Revised: 08/09/2014 Document Reviewed: 03/26/2013 [employer]vier   [employer] Patient Education 2016 [employer]  

## 2016-04-29 ENCOUNTER — Other Ambulatory Visit: Payer: Self-pay | Admitting: Internal Medicine

## 2016-04-29 DIAGNOSIS — Z1231 Encounter for screening mammogram for malignant neoplasm of breast: Secondary | ICD-10-CM

## 2016-05-05 ENCOUNTER — Ambulatory Visit (INDEPENDENT_AMBULATORY_CARE_PROVIDER_SITE_OTHER): Payer: BLUE CROSS/BLUE SHIELD | Admitting: Internal Medicine

## 2016-05-05 ENCOUNTER — Other Ambulatory Visit (INDEPENDENT_AMBULATORY_CARE_PROVIDER_SITE_OTHER): Payer: BLUE CROSS/BLUE SHIELD

## 2016-05-05 ENCOUNTER — Encounter: Payer: Self-pay | Admitting: Internal Medicine

## 2016-05-05 VITALS — BP 126/86 | HR 82 | Temp 98.6°F | Resp 16 | Ht 64.0 in | Wt 184.0 lb

## 2016-05-05 DIAGNOSIS — Z23 Encounter for immunization: Secondary | ICD-10-CM | POA: Diagnosis not present

## 2016-05-05 DIAGNOSIS — D500 Iron deficiency anemia secondary to blood loss (chronic): Secondary | ICD-10-CM

## 2016-05-05 DIAGNOSIS — Z7901 Long term (current) use of anticoagulants: Secondary | ICD-10-CM

## 2016-05-05 DIAGNOSIS — R7303 Prediabetes: Secondary | ICD-10-CM | POA: Diagnosis not present

## 2016-05-05 DIAGNOSIS — D6861 Antiphospholipid syndrome: Secondary | ICD-10-CM

## 2016-05-05 LAB — CBC WITH DIFFERENTIAL/PLATELET
BASOS ABS: 0 10*3/uL (ref 0.0–0.1)
Basophils Relative: 0.8 % (ref 0.0–3.0)
EOS PCT: 1 % (ref 0.0–5.0)
Eosinophils Absolute: 0 10*3/uL (ref 0.0–0.7)
HCT: 41 % (ref 36.0–46.0)
HEMOGLOBIN: 14.2 g/dL (ref 12.0–15.0)
Lymphocytes Relative: 37.1 % (ref 12.0–46.0)
Lymphs Abs: 1.8 10*3/uL (ref 0.7–4.0)
MCHC: 34.6 g/dL (ref 30.0–36.0)
MCV: 86.8 fl (ref 78.0–100.0)
MONOS PCT: 13 % — AB (ref 3.0–12.0)
Monocytes Absolute: 0.6 10*3/uL (ref 0.1–1.0)
NEUTROS PCT: 48.1 % (ref 43.0–77.0)
Neutro Abs: 2.3 10*3/uL (ref 1.4–7.7)
Platelets: 238 10*3/uL (ref 150.0–400.0)
RBC: 4.72 Mil/uL (ref 3.87–5.11)
RDW: 14.1 % (ref 11.5–15.5)
WBC: 4.8 10*3/uL (ref 4.0–10.5)

## 2016-05-05 LAB — COMPREHENSIVE METABOLIC PANEL
ALT: 12 U/L (ref 0–35)
AST: 17 U/L (ref 0–37)
Albumin: 3.8 g/dL (ref 3.5–5.2)
Alkaline Phosphatase: 50 U/L (ref 39–117)
BUN: 11 mg/dL (ref 6–23)
CHLORIDE: 101 meq/L (ref 96–112)
CO2: 26 meq/L (ref 19–32)
CREATININE: 0.96 mg/dL (ref 0.40–1.20)
Calcium: 9.3 mg/dL (ref 8.4–10.5)
GFR: 66.1 mL/min (ref >60.00)
GLUCOSE: 170 mg/dL — AB (ref 70–99)
POTASSIUM: 3.6 meq/L (ref 3.5–5.1)
SODIUM: 137 meq/L (ref 135–145)
Total Bilirubin: 0.8 mg/dL (ref 0.2–1.2)
Total Protein: 7.7 g/dL (ref 6.0–8.3)

## 2016-05-05 LAB — IBC PANEL
IRON: 105 ug/dL (ref 42–145)
SATURATION RATIOS: 26.4 % (ref 20.0–50.0)
TRANSFERRIN: 284 mg/dL (ref 212.0–360.0)

## 2016-05-05 LAB — FERRITIN: FERRITIN: 28.8 ng/mL (ref 10.0–291.0)

## 2016-05-05 NOTE — Patient Instructions (Signed)
Iron Deficiency Anemia, Adult Anemia is a condition in which there are less red blood cells or hemoglobin in the blood than normal. Hemoglobin is the part of red blood cells that carries oxygen. Iron deficiency anemia is anemia caused by too little iron. It is the most common type of anemia. It may leave you tired and short of breath. CAUSES   Lack of iron in the diet.  Poor absorption of iron, as seen with intestinal disorders.  Intestinal bleeding.  Heavy periods. SIGNS AND SYMPTOMS  Mild anemia may not be noticeable. Symptoms may include:  Fatigue.  Headache.  Pale skin.  Weakness.  Tiredness.  Shortness of breath.  Dizziness.  Cold hands and feet.  Fast or irregular heartbeat. DIAGNOSIS  Diagnosis requires a thorough evaluation and physical exam by your health care provider. Blood tests are generally used to confirm iron deficiency anemia. Additional tests may be done to find the underlying cause of your anemia. These may include:  Testing for blood in the stool (fecal occult blood test).  A procedure to see inside the colon and rectum (colonoscopy).  A procedure to see inside the esophagus and stomach (endoscopy). TREATMENT  Iron deficiency anemia is treated by correcting the cause of the deficiency. Treatment may involve:  Adding iron-rich foods to your diet.  Taking iron supplements. Pregnant or breastfeeding women need to take extra iron because their normal diet usually does not provide the required amount.  Taking vitamins. Vitamin C improves the absorption of iron. Your health care provider may recommend that you take your iron tablets with a glass of orange juice or vitamin C supplement.  Medicines to make heavy menstrual flow lighter.  Surgery. HOME CARE INSTRUCTIONS   Take iron as directed by your health care provider.  If you cannot tolerate taking iron supplements by mouth, talk to your health care provider about taking them through a vein  (intravenously) or an injection into a muscle.  For the best iron absorption, iron supplements should be taken on an empty stomach. If you cannot tolerate them on an empty stomach, you may need to take them with food.  Do not drink milk or take antacids at the same time as your iron supplements. Milk and antacids may interfere with the absorption of iron.  Iron supplements can cause constipation. Make sure to include fiber in your diet to prevent constipation. A stool softener may also be recommended.  Take vitamins as directed by your health care provider.  Eat a diet rich in iron. Foods high in iron include liver, lean beef, whole-grain bread, eggs, dried fruit, and dark green leafy vegetables. SEEK IMMEDIATE MEDICAL CARE IF:   You faint. If this happens, do not drive. Call your local emergency services (911 in [place] if no other help is available.  You have chest pain.  You feel nauseous or vomit.  You have severe or increased shortness of breath with activity.  You feel weak.  You have a rapid heartbeat.  You have unexplained sweating.  You become light-headed when getting up from a chair or bed. MAKE SURE YOU:   Understand these instructions.  Will watch your condition.  Will get help right away if you are not doing well or get worse.   This information is not intended to replace advice given to you by your health care provider. Make sure you discuss any questions you have with your health care provider.   Document Released: 07/16/2000 Document Revised: 08/09/2014 Document Reviewed: 03/26/2013 [employer]vier   [employer] Patient Education 2016 [employer]  

## 2016-05-05 NOTE — Progress Notes (Signed)
Pre visit review using our clinic review tool, if applicable. No additional management support is needed unless otherwise documented below in the visit note. 

## 2016-05-05 NOTE — Progress Notes (Signed)
Subjective:  Patient ID: [name], female    DOB: 09/04/1970  Age: 45 y.o. MRN: [id number]  CC: Anemia   HPI [name] presents for follow-up on iron deficiency anemia. Her last menstrual cycle was about 3 weeks ago and only lasted 4 days light bleeding. She is taking the iron replacement therapy and tolerating it well. She denies fatigue, shortness of breath, weakness, or abdominal pain.  Outpatient Medications Prior to Visit  Medication Sig Dispense Refill  . albuterol (PROVENTIL HFA;VENTOLIN HFA) 108 (90 BASE) MCG/ACT inhaler Inhale 2 puffs into the lungs daily as needed for shortness of breath. For shortness of breath or wheezing 1 Inhaler 5  . ferrous sulfate 325 (65 FE) MG tablet Take 1 tablet (325 mg total) by mouth daily with breakfast. 60 tablet 5  . fluticasone (FLONASE) 50 MCG/ACT nasal spray Place 2 sprays into both nostrils daily. 16 g 11  . rivaroxaban (XARELTO) 20 MG TABS tablet Take 1 tablet (20 mg total) by mouth daily. TAKE 1 TABLET (20 MG TOTAL) BY MOUTH EVERY MORNING. 30 tablet 11  . acetaminophen (TYLENOL) 325 MG tablet Take 325 mg by mouth daily as needed for moderate pain. For pain    . amoxicillin-clavulanate (AUGMENTIN) 875-125 MG tablet Take 1 tablet by mouth 2 (two) times daily. 20 tablet 0  . Multiple Vitamin (MULTIVITAMIN) tablet Take 1 tablet by mouth daily.    Marland Kitchen oxyCODONE (OXY IR/ROXICODONE) 5 MG immediate release tablet     . promethazine (PHENERGAN) 12.5 MG tablet      No facility-administered medications prior to visit.     ROS Review of Systems  Constitutional: Negative.  Negative for activity change, appetite change, diaphoresis, fatigue and fever.  HENT: Negative.  Negative for trouble swallowing.   Eyes: Negative for visual disturbance.  Respiratory: Negative.  Negative for cough, choking, chest tightness, shortness of breath and stridor.   Cardiovascular: Negative.  Negative for chest pain, palpitations and leg swelling.    Gastrointestinal: Negative.  Negative for abdominal pain, blood in stool, constipation, diarrhea, nausea and vomiting.  Endocrine: Negative for polydipsia, polyphagia and polyuria.  Genitourinary: Negative.  Negative for dysuria, flank pain, menstrual problem, vaginal discharge and vaginal pain.  Musculoskeletal: Negative.   Skin: Negative.  Negative for color change and rash.  Allergic/Immunologic: Negative.   Neurological: Negative.   Hematological: Negative.  Negative for adenopathy. Does not bruise/bleed easily.  Psychiatric/Behavioral: Negative.     Objective:  BP 126/86 (BP Location: Left Arm, Patient Position: Sitting, Cuff Size: Normal)   Pulse 82   Temp 98.6 F (37 C) (Oral)   Resp 16   Ht 5' 4"  (1.626 m)   Wt 184 lb (83.5 kg)   LMP 04/14/2016   SpO2 98%   BMI 31.58 kg/m   BP Readings from Last 3 Encounters:  05/05/16 126/86  01/01/16 118/80  12/18/15 120/82    Wt Readings from Last 3 Encounters:  05/05/16 184 lb (83.5 kg)  01/01/16 179 lb (81.2 kg)  12/18/15 177 lb (80.3 kg)    Physical Exam  Constitutional: She is oriented to person, place, and time. No distress.  HENT:  Mouth/Throat: Oropharynx is clear and moist. No oropharyngeal exudate.  Eyes: Conjunctivae are normal. Left eye exhibits no discharge. No scleral icterus.  Neck: Normal range of motion. Neck supple. No JVD present. No tracheal deviation present. No thyromegaly present.  Cardiovascular: Normal rate, regular rhythm, normal heart sounds and intact distal pulses.  Exam reveals no gallop and no  friction rub.   No murmur heard. Pulmonary/Chest: Effort normal and breath sounds normal. No stridor. No respiratory distress. She has no wheezes. She has no rales. She exhibits no tenderness.  Abdominal: Bowel sounds are normal. She exhibits no distension and no mass. There is no tenderness. There is no rebound and no guarding.  Musculoskeletal: Normal range of motion. She exhibits no edema, tenderness or  deformity.  Lymphadenopathy:    She has no cervical adenopathy.  Neurological: She is oriented to person, place, and time.  Skin: Skin is warm and dry. No rash noted. She is not diaphoretic. No erythema. No pallor.  Vitals reviewed.   Lab Results  Component Value Date   WBC 4.8 05/05/2016   HGB 14.2 05/05/2016   HCT 41.0 05/05/2016   PLT 238.0 05/05/2016   GLUCOSE 170 (H) 05/05/2016   CHOL 201 (H) 06/30/2015   TRIG 58.0 06/30/2015   HDL 53.20 06/30/2015   LDLCALC 136 (H) 06/30/2015   ALT 12 05/05/2016   AST 17 05/05/2016   NA 137 05/05/2016   K 3.6 05/05/2016   CL 101 05/05/2016   CREATININE 0.96 05/05/2016   BUN 11 05/05/2016   CO2 26 05/05/2016   TSH 1.43 06/30/2015   INR 2.80 03/20/2012   HGBA1C 5.7 06/30/2015    US Abdomen Complete  Result Date: 12/26/2015 CLINICAL DATA:  Chronic viral hepatitis-B without delta agent and without coma. Screen for hepatocellular carcinoma. EXAM: ABDOMEN ULTRASOUND COMPLETE COMPARISON:  CT abdomen pelvis 04/18/2014 FINDINGS: Gallbladder: No gallstones or wall thickening visualized. No sonographic Murphy sign noted by sonographer. Common bile duct: Diameter: 2.6 mm Liver: No focal lesion identified. Within normal limits in parenchymal echogenicity. IVC: No abnormality visualized. Pancreas: Visualized portion unremarkable. Spleen: Size and appearance within normal limits. Right Kidney: Length: 10.1 cm. Echogenicity within normal limits. No mass or hydronephrosis visualized. Left Kidney: Length: 10.3 cm. Echogenicity within normal limits. No mass or hydronephrosis visualized. Abdominal aorta: No aneurysm visualized. Other findings: None. IMPRESSION: Sonographically normal liver. No mass lesion. Negative for cholelithiasis. Electronically Signed   By: Franchot Gallo M.D.   On: 12/26/2015 13:14    Assessment & Plan:   [name] was seen today for anemia.  Diagnoses and all orders for this visit:  Iron deficiency anemia due to chronic blood  loss- her H&H are normal now and her iron level is normal, will continue iron replacement therapy. -     CBC with Differential/Platelet; Future -     IBC panel; Future -     Comprehensive metabolic panel; Future -     Ferritin; Future  Need for prophylactic vaccination and inoculation against influenza -     Flu Vaccine QUAD 36+ mos IM  Antiphospholipid syndrome ([place])- will continue Xarelto to prevent TE events -     Comprehensive metabolic panel; Future  Long term current use of anticoagulant- Her creatinine clearance is 76 mL/m so will continue Xarelto at 20 mg a day. -     Comprehensive metabolic panel; Future   I have discontinued [name]'s acetaminophen, multivitamin, amoxicillin-clavulanate, oxyCODONE, and promethazine. I am also having her maintain her albuterol, rivaroxaban, fluticasone, and ferrous sulfate.  No orders of the defined types were placed in this encounter.    Follow-up: Return in about 6 months (around 11/03/2016).  Scarlette Calico, MD

## 2016-05-06 NOTE — Assessment & Plan Note (Signed)
Her blood sugar is elevated at 170. I've asked her return for an A1c check.

## 2016-05-12 ENCOUNTER — Ambulatory Visit
Admission: RE | Admit: 2016-05-12 | Discharge: 2016-05-12 | Disposition: A | Discharge status: Home / Self Care | Payer: BLUE CROSS/BLUE SHIELD | Source: Ambulatory Visit | Attending: Internal Medicine | Admitting: Internal Medicine

## 2016-05-12 DIAGNOSIS — Z1231 Encounter for screening mammogram for malignant neoplasm of breast: Secondary | ICD-10-CM

## 2016-05-16 LAB — HM MAMMOGRAPHY

## 2016-08-26 ENCOUNTER — Other Ambulatory Visit: Payer: Self-pay | Admitting: Pulmonary Disease

## 2016-09-24 ENCOUNTER — Telehealth: Payer: Self-pay | Admitting: Internal Medicine

## 2016-09-24 ENCOUNTER — Ambulatory Visit (INDEPENDENT_AMBULATORY_CARE_PROVIDER_SITE_OTHER): Payer: BLUE CROSS/BLUE SHIELD | Admitting: Family Medicine

## 2016-09-24 ENCOUNTER — Encounter: Payer: Self-pay | Admitting: Family Medicine

## 2016-09-24 VITALS — BP 124/80 | HR 82 | Temp 97.8°F | Resp 12 | Ht 64.0 in | Wt 183.4 lb

## 2016-09-24 DIAGNOSIS — M791 Myalgia, unspecified site: Secondary | ICD-10-CM

## 2016-09-24 DIAGNOSIS — M26623 Arthralgia of bilateral temporomandibular joint: Secondary | ICD-10-CM | POA: Diagnosis not present

## 2016-09-24 DIAGNOSIS — J309 Allergic rhinitis, unspecified: Secondary | ICD-10-CM | POA: Diagnosis not present

## 2016-09-24 MED ORDER — TRAMADOL HCL 50 MG PO TABS: 50.0000 mg | ORAL_TABLET | Freq: Three times a day (TID) | ORAL | 0 refills | Status: AC | PRN

## 2016-09-24 NOTE — Progress Notes (Signed)
Pre visit review using our clinic review tool, if applicable. No additional management support is needed unless otherwise documented below in the visit note. 

## 2016-09-24 NOTE — Patient Instructions (Signed)
Ms.Janyla Nakama I have seen you today for an acute visit.  A few things to remember from today's visit:   Allergic rhinitis, unspecified chronicity, unspecified seasonality, unspecified trigger  Bilateral temporomandibular joint pain  Myalgia   ? Viral illness.  Continue Tylenol and start Tramadol.  Rest and plenty of fluids.   Temporomandibular Joint Syndrome Temporomandibular joint (TMJ) syndrome is a condition that affects the joints between your jaw and your skull. The TMJs are located near your ears and allow your jaw to open and close. These joints and the nearby muscles are involved in all movements of the jaw. People with TMJ syndrome have pain in the area of these joints and muscles. Chewing, biting, or other movements of the jaw can be difficult or painful. TMJ syndrome can be caused by various things. In many cases, the condition is mild and goes away within a few weeks. For some people, the condition can become a long-term problem. What are the causes? Possible causes of TMJ syndrome include:  Grinding your teeth or clenching your jaw. Some people do this when they are under stress.  Arthritis.  Injury to the jaw.  Head or neck injury.  Teeth or dentures that are not aligned well. In some cases, the cause of TMJ syndrome may not be known. What are the signs or symptoms? The most common symptom is an aching pain on the side of the head in the area of the TMJ. Other symptoms may include:  Pain when moving your jaw, such as when chewing or biting.  Being unable to open your jaw all the way.  Making a clicking sound when you open your mouth.  Headache.  Earache.  Neck or shoulder pain. How is this diagnosed? Diagnosis can usually be made based on your symptoms, your medical history, and a physical exam. Your health care provider may check the range of motion of your jaw. Imaging tests, such as X-rays or an MRI, are sometimes done. You may need to see  your dentist to determine if your teeth and jaw are lined up correctly. How is this treated? TMJ syndrome often goes away on its own. If treatment is needed, the options may include:  Eating soft foods and applying ice or heat.  Medicines to relieve pain or inflammation.  Medicines to relax the muscles.  A splint, bite plate, or mouthpiece to prevent teeth grinding or jaw clenching.  Relaxation techniques or counseling to help reduce stress.  Transcutaneous electrical nerve stimulation (TENS). This helps to relieve pain by applying an electrical current through the skin.  Acupuncture. This is sometimes helpful to relieve pain.  Jaw surgery. This is rarely needed. Follow these instructions at home:  Take medicines only as directed by your health care provider.  Eat a soft diet if you are having trouble chewing.  Apply ice to the painful area.  Put ice in a plastic bag.  Place a towel between your skin and the bag.  Leave the ice on for 20 minutes, 2-3 times a day.  Apply a warm compress to the painful area as directed.  Massage your jaw area and perform any jaw stretching exercises as recommended by your health care provider.  If you were given a mouthpiece or bite plate, wear it as directed.  Avoid foods that require a lot of chewing. Do not chew gum.  Keep all follow-up visits as directed by your health care provider. This is important. Contact a health care provider if:  You  are having trouble eating.  You have new or worsening symptoms. Get help right away if:  Your jaw locks open or closed. This information is not intended to replace advice given to you by your health care provider. Make sure you discuss any questions you have with your health care provider. Document Released: 04/13/2001 Document Revised: 03/18/2016 Document Reviewed: 02/21/2014 Elsevier Interactive Patient Education  2017 [employer].       Medications prescribed today are intended  for short period of time and will not be refill upon request, a follow up appointment might be necessary to discuss continuation of of treatment if appropriate.     In general please monitor for signs of worsening symptoms and seek immediate medical attention if any concerning.  If symptoms are not resolved in a few days/weeks you should schedule a follow up appointment with your doctor, before if needed.  Please be sure you have an appointment already scheduled with your PCP before you leave today.

## 2016-09-24 NOTE — Telephone Encounter (Signed)
Patient Name: [name] DOB: Apr 16, 1971 Initial Comment Having pain in ear, chest and back Nurse Assessment Nurse: Ronnald Ramp, RN, Miranda Date/Time (Eastern Time): 09/24/2016 8:17:19 AM Confirm and document reason for call. If symptomatic, describe symptoms. ---Caller states she has has body aches (including chest and back), ear pain, decreased appetite for 1 week. She denies any runny nose, congestion, or cough. Does the patient have any new or worsening symptoms? ---Yes Will a triage be completed? ---Yes Related visit to physician within the last 2 weeks? ---No Does the PT have any chronic conditions? (i.e. diabetes, asthma, etc.) ---Yes List chronic conditions. ---Hx of blood clots, Allergies Is the patient pregnant or possibly pregnant? (Ask all females between the ages of 64-55) ---No Is this a behavioral health or substance abuse call? ---No Guidelines Guideline Title Affirmed Question Affirmed Notes Muscle Aches and Body Pain [1] MODERATE pain (e.g., interferes with normal activities) AND [2] present > 3 days Final Disposition User See PCP When Office is Open (within 3 days) Ronnald Ramp, RN, Miranda Comments No appt available today with PCP or at primary office. Caller did not want to wait and requested an appt for today. Appt scheduled at [facility] with Dr. Martinique at [place] Disagree/Comply: Comply

## 2016-09-24 NOTE — Progress Notes (Signed)
HPI:   ACUTE VISIT:  Chief Complaint  Patient presents with  . Generalized Body Aches  . Ear Pain    [name] is a 46 y.o. female, who is here today complaining of a week of body aches, earache,fatigue,nasal congestion, and rhinorrhea.  Earache, bilateral, she points to TMJ. Exacerbated by palpation, chewing, and by opening mouth. Alleviated with rest. Pain is moderate, no radiated. C/O "hurting all over", she denies Hx of fibromyalgia.  Denies recent trauma or prior Hx of TMJ pain.  She is not sure about fever. She denies sore throat,cough,abdominal pain, nausea,vomiting,or urinary symptoms.  She has Hx of PE and states that she is "always" afraid it will happen again, she is on Xarelto. She denies LE edema, erythema,recent long travel,palpitations,dyspnea, chest pain, or wheezing.  Hx of allergic rhinitis.  No sick contact or recent travel.  She has not taken OTC analgesics today, she took Tylenol yesterday. Allergic to Aspirin.   Review of Systems  Constitutional: Positive for appetite change and fatigue.  HENT: Positive for congestion and rhinorrhea. Negative for ear pain, mouth sores, sinus pressure, sneezing, sore throat, trouble swallowing and voice change.   Eyes: Negative for discharge, redness and itching.  Respiratory: Negative for cough, shortness of breath and wheezing.   Cardiovascular: Negative for palpitations and leg swelling.  Gastrointestinal: Negative for abdominal pain, diarrhea, nausea and vomiting.  Musculoskeletal: Positive for arthralgias and myalgias. Negative for joint swelling and neck stiffness.  Skin: Negative for color change and rash.  Allergic/Immunologic: Positive for environmental allergies.  Neurological: Positive for headaches (fronto temporal, bilateral). Negative for syncope, weakness and numbness.  Hematological: Negative for adenopathy. Does not bruise/bleed easily.  Psychiatric/Behavioral: Negative for  confusion. The patient is nervous/anxious.       Current Outpatient Prescriptions on File Prior to Visit  Medication Sig Dispense Refill  . albuterol (PROVENTIL HFA;VENTOLIN HFA) 108 (90 BASE) MCG/ACT inhaler Inhale 2 puffs into the lungs daily as needed for shortness of breath. For shortness of breath or wheezing 1 Inhaler 5  . ferrous sulfate 325 (65 FE) MG tablet Take 1 tablet (325 mg total) by mouth daily with breakfast. 60 tablet 5  . fluticasone (FLONASE) 50 MCG/ACT nasal spray Place 2 sprays into both nostrils daily. 16 g 11  . rivaroxaban (XARELTO) 20 MG TABS tablet Take 1 tablet (20 mg total) by mouth daily. TAKE 1 TABLET (20 MG TOTAL) BY MOUTH EVERY MORNING. 30 tablet 11  . XARELTO 20 MG TABS tablet TAKE ONE TABLET BY MOUTH ONCE DAILY IN THE MORNING 30 tablet 2   No current facility-administered medications on file prior to visit.      Past Medical History:  Diagnosis Date  . CTEPH (chronic thromboembolic pulmonary hypertension)   . DVT (deep venous thrombosis) ([place])   . Hepatitis B infection    Hep B core Ab 04/2005  . Primary hypercoagulable state ([place])   . Pulmonary embolism ([place]) 2005      . Seasonal allergies    Allergies  Allergen Reactions  . Aspirin Other (See Comments)    Other Reaction: GI Upset Since being in [place], told she has an ulcer    Social History   Social History  . Marital status: Married    Spouse name: N/A  . Number of children: 3  . Years of education: N/A   Occupational History  .  Unemployed   Social History Main Topics  . Smoking status: Never Smoker  . Smokeless tobacco:  Never Used  . Alcohol use No  . Drug use: No  . Sexual activity: Not Currently   Other Topics Concern  . None   Social History Narrative   Pt lives in [place], braids hair for a living.     Vitals:   09/24/16 1044  BP: 124/80  Pulse: 82  Resp: 12  Temp: 97.8 F (36.6 C)  O2 sat 99% at RA. Body mass index is 31.48 kg/m.   Physical Exam    Constitutional: She is oriented to person, place, and time. She appears well-developed. She does not appear ill. No distress.  HENT:  Head: Atraumatic.  Right Ear: Tympanic membrane, external ear and ear canal normal.  Left Ear: Tympanic membrane, external ear and ear canal normal.  Nose: Rhinorrhea present. Right sinus exhibits no maxillary sinus tenderness and no frontal sinus tenderness. Left sinus exhibits no maxillary sinus tenderness and no frontal sinus tenderness.  Mouth/Throat: Oropharynx is clear and moist and mucous membranes are normal.  Tenderness upon palpation of TMJ bilateral, no crepitus,no limitation of ROM but movement elicits pain. No deformity,local edema or erythema appreciated.   Eyes: Conjunctivae and EOM are normal.  Neck: No muscular tenderness present.  Cardiovascular: Normal rate and regular rhythm.   No murmur heard. Respiratory: Effort normal and breath sounds normal. No stridor. No respiratory distress. Tenderness: costocondral joints bilateral.  Musculoskeletal: She exhibits no edema or tenderness.       Cervical back: She exhibits no tenderness and no bony tenderness.       Thoracic back: She exhibits no tenderness.  Lymphadenopathy:       Head (right side): No submandibular, no preauricular and no posterior auricular adenopathy present.       Head (left side): No submandibular, no preauricular and no posterior auricular adenopathy present.    She has no cervical adenopathy.  Neurological: She is alert and oriented to person, place, and time. She has normal strength. No cranial nerve deficit. Coordination and gait normal.  Skin: Skin is warm. No rash noted. No erythema.  Psychiatric: Her speech is normal. Her mood appears anxious.  Well groomed, good eye contact.      ASSESSMENT AND PLAN:   [name] was seen today for generalized body aches and ear pain.  Diagnoses and all orders for this visit:  Bilateral temporomandibular joint pain  On  examination no deformity or limitation of movement, so I do not think imaging is needed today. Soft diet and TMJ treatment recommendations given (handout). F/U as needed.  -     traMADol (ULTRAM) 50 MG tablet; Take 1 tablet (50 mg total) by mouth every 8 (eight) hours as needed.  Allergic rhinitis, unspecified chronicity, unspecified seasonality, unspecified trigger  Some of symptoms reported today could be related to allergies. Flonase nasal spray and nasal irrigations daily. OTC antihistaminic may also help.   Myalgia  ? Viral etiology. Monitor for new symptoms. Tramadol side effects discussed. Instructed about warning signs.  F/U with PCP in 1-2 weeks,before if needed.   -     traMADol (ULTRAM) 50 MG tablet; Take 1 tablet (50 mg total) by mouth every 8 (eight) hours as needed.     Return in about 7 days (around 10/01/2016) for myalgias with PCP.   -[name] was advised to return or notify a doctor immediately if symptoms suddenly get worsen or new concerns arise.       Colston Pyle G. Martinique, MD  [facility]. [place] office.

## 2016-10-21 ENCOUNTER — Ambulatory Visit (INDEPENDENT_AMBULATORY_CARE_PROVIDER_SITE_OTHER): Payer: BLUE CROSS/BLUE SHIELD | Admitting: Pulmonary Disease

## 2016-10-21 ENCOUNTER — Other Ambulatory Visit: Payer: BLUE CROSS/BLUE SHIELD

## 2016-10-21 ENCOUNTER — Other Ambulatory Visit (INDEPENDENT_AMBULATORY_CARE_PROVIDER_SITE_OTHER): Payer: BLUE CROSS/BLUE SHIELD

## 2016-10-21 ENCOUNTER — Encounter: Payer: Self-pay | Admitting: Pulmonary Disease

## 2016-10-21 VITALS — BP 102/79 | HR 79 | Ht 64.0 in | Wt 184.0 lb

## 2016-10-21 DIAGNOSIS — I2724 Chronic thromboembolic pulmonary hypertension: Secondary | ICD-10-CM

## 2016-10-21 LAB — COMPREHENSIVE METABOLIC PANEL
ALT: 13 U/L (ref 0–35)
AST: 19 U/L (ref 0–37)
Albumin: 4 g/dL (ref 3.5–5.2)
Alkaline Phosphatase: 59 U/L (ref 39–117)
BUN: 13 mg/dL (ref 6–23)
CHLORIDE: 104 meq/L (ref 96–112)
CO2: 29 mEq/L (ref 19–32)
CREATININE: 0.9 mg/dL (ref 0.40–1.20)
Calcium: 9.7 mg/dL (ref 8.4–10.5)
GFR: 71.07 mL/min (ref >60.00)
Glucose, Bld: 100 mg/dL — ABNORMAL HIGH (ref 70–99)
POTASSIUM: 3.8 meq/L (ref 3.5–5.1)
SODIUM: 138 meq/L (ref 135–145)
Total Bilirubin: 0.4 mg/dL (ref 0.2–1.2)
Total Protein: 7.5 g/dL (ref 6.0–8.3)

## 2016-10-21 LAB — CBC
HCT: 41.6 % (ref 36.0–46.0)
Hemoglobin: 14 g/dL (ref 12.0–15.0)
MCHC: 33.6 g/dL (ref 30.0–36.0)
MCV: 88.5 fl (ref 78.0–100.0)
PLATELETS: 287 10*3/uL (ref 150.0–400.0)
RBC: 4.7 Mil/uL (ref 3.87–5.11)
RDW: 13.6 % (ref 11.5–15.5)
WBC: 5 10*3/uL (ref 4.0–10.5)

## 2016-10-21 LAB — D-DIMER, QUANTITATIVE (NOT AT ARMC): D DIMER QUANT: 0.61 ug{FEU}/mL — AB (ref <0.50)

## 2016-10-21 LAB — IRON AND TIBC
%SAT: 13 % (ref 11–50)
Iron: 47 ug/dL (ref 40–190)
TIBC: 367 ug/dL (ref 250–450)
UIBC: 320 ug/dL (ref 125–400)

## 2016-10-21 LAB — VITAMIN B12: Vitamin B-12: 1153 pg/mL — ABNORMAL HIGH (ref 211–911)

## 2016-10-21 LAB — FERRITIN: Ferritin: 25 ng/mL (ref 10.0–291.0)

## 2016-10-21 LAB — FOLATE

## 2016-10-21 MED ORDER — RIVAROXABAN 20 MG PO TABS: 20.0000 mg | ORAL_TABLET | Freq: Every day | ORAL | 5 refills | Status: DC

## 2016-10-21 NOTE — Progress Notes (Signed)
Current Outpatient Prescriptions on File Prior to Visit  Medication Sig  . albuterol (PROVENTIL HFA;VENTOLIN HFA) 108 (90 BASE) MCG/ACT inhaler Inhale 2 puffs into the lungs daily as needed for shortness of breath. For shortness of breath or wheezing  . ferrous sulfate 325 (65 FE) MG tablet Take 1 tablet (325 mg total) by mouth daily with breakfast.  . fluticasone (FLONASE) 50 MCG/ACT nasal spray Place 2 sprays into both nostrils daily.   No current facility-administered medications on file prior to visit.      Chief Complaint  Patient presents with  . Follow-up    Pt denies any breathing issues at this time. Needs refill of Xarelto     Pulmonary tests V/Q scan 03/18/06 >> multiple b/l large mismatched segmental perfusion defects from chronic PE PFT 11/14/12 >> FEV1 2.52 (95%), FEV1% 92, TLC 3.75 (70%), DLCO 76%, no BD CT chest 12/14/12 >> 3 mm RUL nodule, scar LUL posterior segment V/Q scan 06/06/14 >> very low probability for PE CT chest 01/16/15 >> mild dilation of pulmonary trunk  Cardiac tests Echo 06/11/14 >> EF 55 to 60%, mild TR and PR  Past medical history s/p IVC, Hep B, Allergies  Past surgical history, Family history, Social history, Allergies reviewed  Vital Signs BP 102/79 (BP Location: Left Arm, Cuff Size: Normal)   Pulse 79   Ht 5' 4"  (1.626 m)   Wt 184 lb (83.5 kg)   SpO2 99%   BMI 31.58 kg/m   History of Present Illness [name] is a 46 y.o. female with recurrent PE/DVT and chronic thromboembolic pulmonary hypertension on life long anticoagulation.  She had pulmonary endarterectomy at [facility] in 2007.  She has been doing well except for feeling low energy level after working.  She denies headache, blurred vision, chest pain, palpitations, dyspnea, cough, wheeze, abdominal pain, vaginal bleeding, melena, skin rash, joint pain, or fever.  She gets ankle swelling sometimes.  Physical Exam  General - pleasant Eyes - pupils reactive ENT - no sinus  tenderness Cardiac - regular, no murmur Chest - no wheeze Back - no tenderness Abd - soft, non tender Ext - no edema Neuro - normal strength Skin - no rashes Psych - normal mood  Discussion: She has hx of CTEPH on long term anticoagulation since 2007.  She is questioning whether she needs to continue anticoagulation.  Assessment/Plan  Recurrent pulmonary embolism with CTEPH. - continue xarelto - check labs, including D dimer - had extensive discussion about benefits/risks of continuing anticoagulation  Low energy level. - check labs   Patient Instructions  Lab tests today  Follow up in 1 year   Time spent 32 minutes  Chesley Mires, MD [place] [facility] Pager:  [phone] 10/21/2016, 9:43 AM

## 2016-10-21 NOTE — Patient Instructions (Signed)
Lab tests today  Follow up in 1 year

## 2016-10-22 ENCOUNTER — Telehealth: Payer: Self-pay | Admitting: Pulmonary Disease

## 2016-10-22 NOTE — Telephone Encounter (Signed)
Called and spoke with pt and she stated that she got the message from VS and she is wanting to know if VS will give her rx for energy.  She stated that she asked him before and he told her to wait for her results to come back.  VS please advise. Thanks  Allergies  Allergen Reactions  . Aspirin Other (See Comments)    Other Reaction: GI Upset Since being in [place], told she has an ulcer

## 2016-10-22 NOTE — Telephone Encounter (Signed)
CMP Latest Ref Rng & Units 10/21/2016 05/05/2016 12/24/2015  Glucose 70 - 99 mg/dL 100(H) 170(H) -  BUN 6 - 23 mg/dL 13 11 -  Creatinine 0.40 - 1.20 mg/dL 0.90 0.96 -  Sodium 135 - 145 mEq/L 138 137 -  Potassium 3.5 - 5.1 mEq/L 3.8 3.6 -  Chloride 96 - 112 mEq/L 104 101 -  CO2 19 - 32 mEq/L 29 26 -  Calcium 8.4 - 10.5 mg/dL 9.7 9.3 -  Total Protein 6.0 - 8.3 g/dL 7.5 7.7 7.6  Total Bilirubin 0.2 - 1.2 mg/dL 0.4 0.8 0.5  Alkaline Phos 39 - 117 U/L 59 50 63  AST 0 - 37 U/L 19 17 19   ALT 0 - 35 U/L 13 12 13     CBC Latest Ref Rng & Units 10/21/2016 05/05/2016 12/18/2015  WBC 4.0 - 10.5 K/uL 5.0 4.8 4.6  Hemoglobin 12.0 - 15.0 g/dL 14.0 14.2 10.2(L)  Hematocrit 36.0 - 46.0 % 41.6 41.0 31.0(L)  Platelets 150.0 - 400.0 K/uL 287.0 238.0 416.0(H)    Iron/TIBC/Ferritin/ %Sat    Component Value Date/Time   IRON 47 10/21/2016 0940   TIBC 367 10/21/2016 0940   FERRITIN 25.0 10/21/2016 0940   IRONPCTSAT 13 10/21/2016 0940    Lab Results  Component Value Date   DDIMER 0.61 (H) 10/21/2016    Left message explaining that D dimer is elevated.  This is a marker that she could be at higher risk of recurrent thromboembolic disease (PE/DVT) and should continue on xarelto.  Advised her to call back if she has questions.

## 2016-10-25 NOTE — Telephone Encounter (Signed)
VS please advise. thanks

## 2016-11-02 NOTE — Telephone Encounter (Signed)
Please advise 

## 2016-11-08 NOTE — Telephone Encounter (Signed)
Left message with pt advising that she should d/w her PCP to have further assessment of cause for her low energy before starting any medications.  Advised her to call back if she has further questions.

## 2016-11-08 NOTE — Telephone Encounter (Signed)
VS please advise.  thanks

## 2016-12-15 ENCOUNTER — Encounter: Payer: Self-pay | Admitting: Gynecology

## 2016-12-15 ENCOUNTER — Emergency Department (HOSPITAL_COMMUNITY): Payer: Worker's Compensation

## 2016-12-15 ENCOUNTER — Emergency Department (HOSPITAL_COMMUNITY)
Admission: EM | Admit: 2016-12-15 | Discharge: 2016-12-15 | Disposition: A | Discharge status: Home / Self Care | Payer: Worker's Compensation | Attending: Emergency Medicine | Admitting: Emergency Medicine

## 2016-12-15 ENCOUNTER — Encounter (HOSPITAL_COMMUNITY): Payer: Self-pay | Admitting: Emergency Medicine

## 2016-12-15 DIAGNOSIS — Z7901 Long term (current) use of anticoagulants: Secondary | ICD-10-CM | POA: Diagnosis not present

## 2016-12-15 DIAGNOSIS — S39012A Strain of muscle, fascia and tendon of lower back, initial encounter: Secondary | ICD-10-CM

## 2016-12-15 DIAGNOSIS — Z79899 Other long term (current) drug therapy: Secondary | ICD-10-CM | POA: Diagnosis not present

## 2016-12-15 DIAGNOSIS — Y929 Unspecified place or not applicable: Secondary | ICD-10-CM | POA: Diagnosis not present

## 2016-12-15 DIAGNOSIS — Y939 Activity, unspecified: Secondary | ICD-10-CM | POA: Insufficient documentation

## 2016-12-15 DIAGNOSIS — X500XXA Overexertion from strenuous movement or load, initial encounter: Secondary | ICD-10-CM | POA: Diagnosis not present

## 2016-12-15 DIAGNOSIS — S3992XA Unspecified injury of lower back, initial encounter: Secondary | ICD-10-CM | POA: Diagnosis present

## 2016-12-15 DIAGNOSIS — Y990 Civilian activity done for income or pay: Secondary | ICD-10-CM | POA: Diagnosis not present

## 2016-12-15 IMAGING — CR DG LUMBAR SPINE 2-3V
3 series · 3 of 3 positions shown · non-contrast
Comparison: Reformats of the lumbar spine from a prior CT dated
[DATE]

CLINICAL DATA: Lumbar pain after lifting a heavy box at work today.

EXAM:
LUMBAR SPINE - 2-3 VIEW

[t lumbar spine ap]
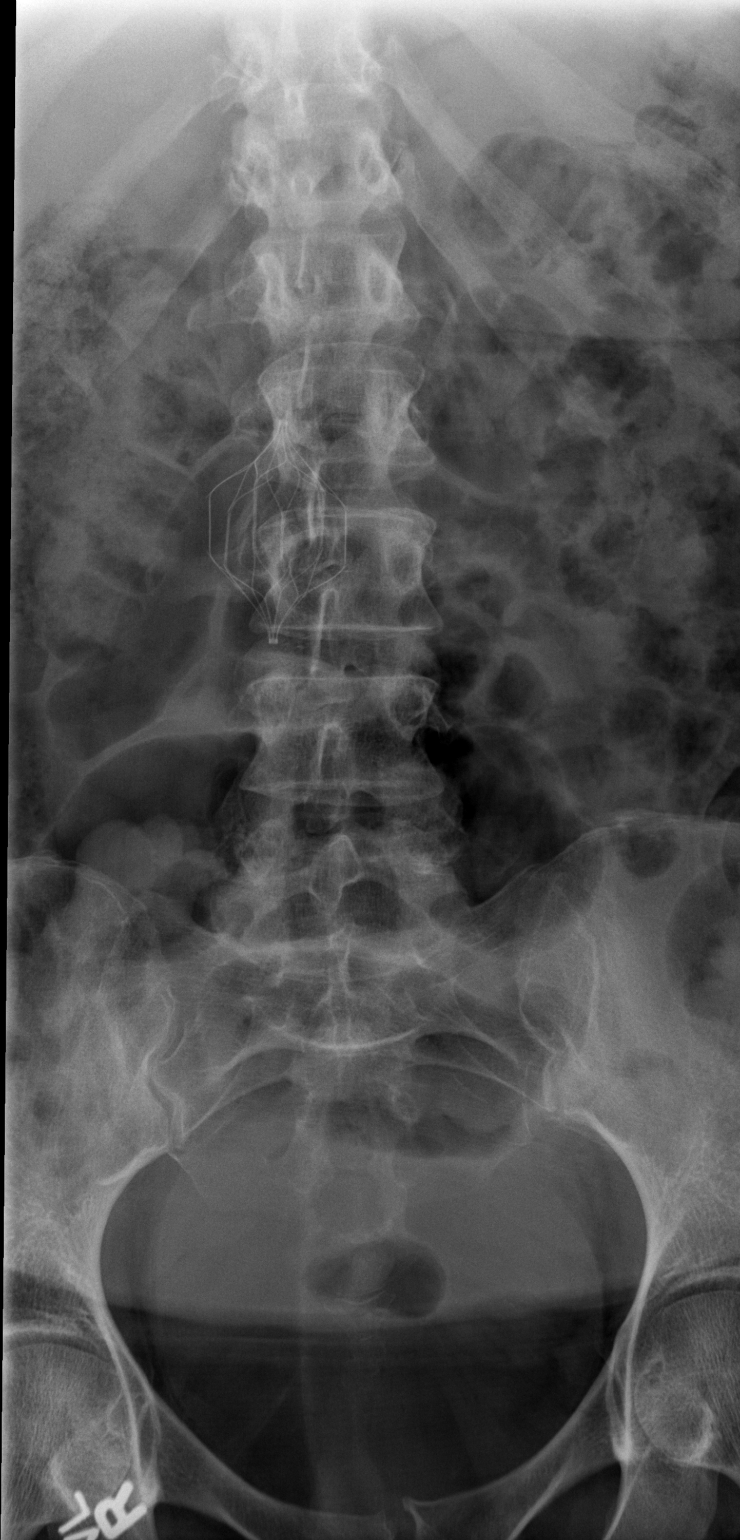

[t lumbar spine lat]
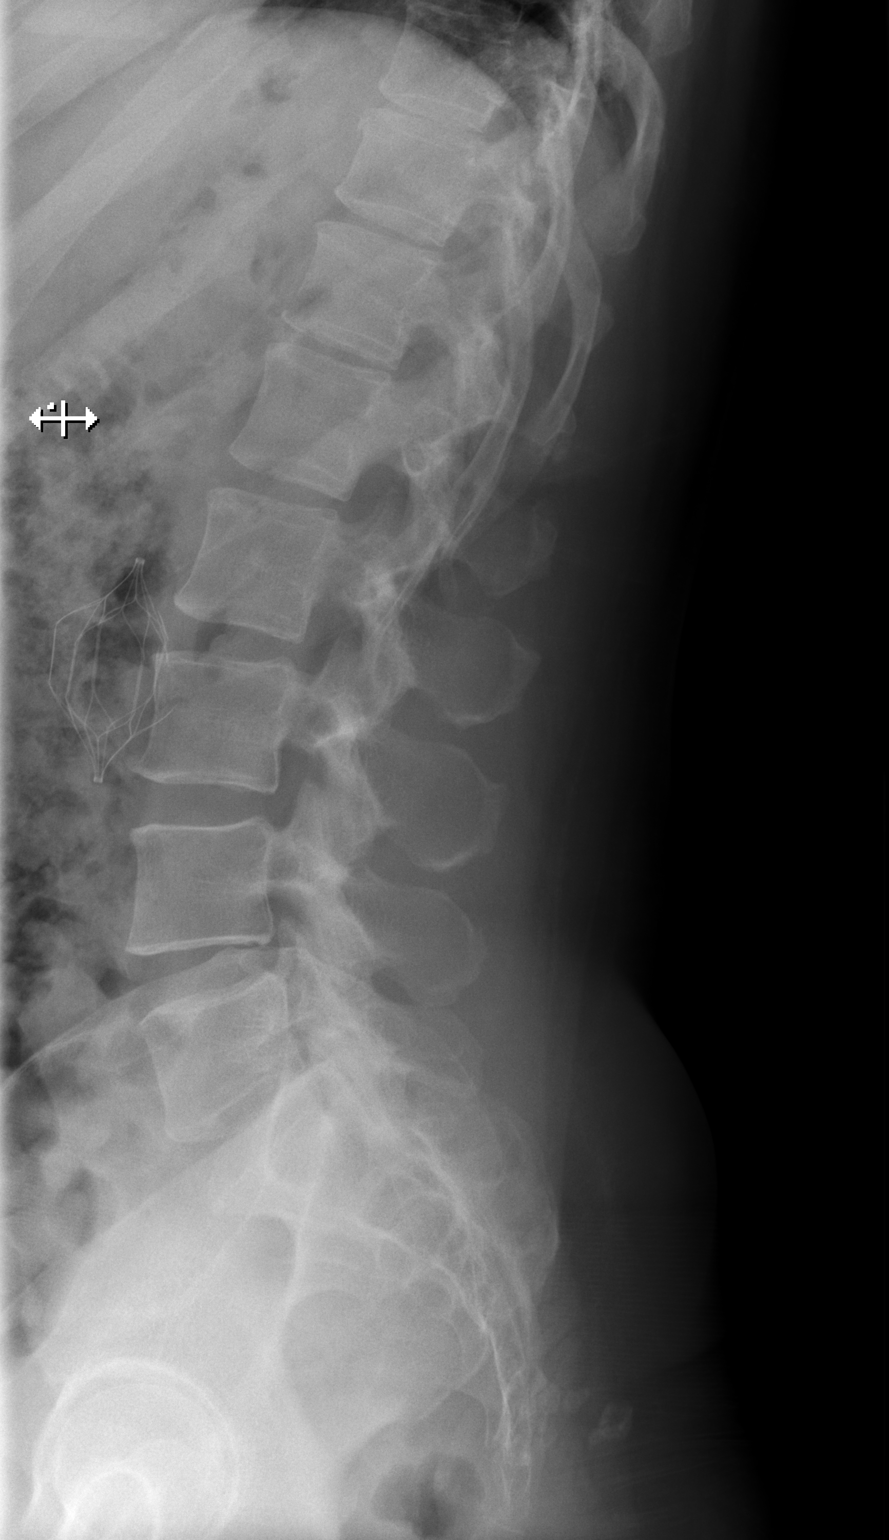

[t lumbar l-5 s-1 spot]
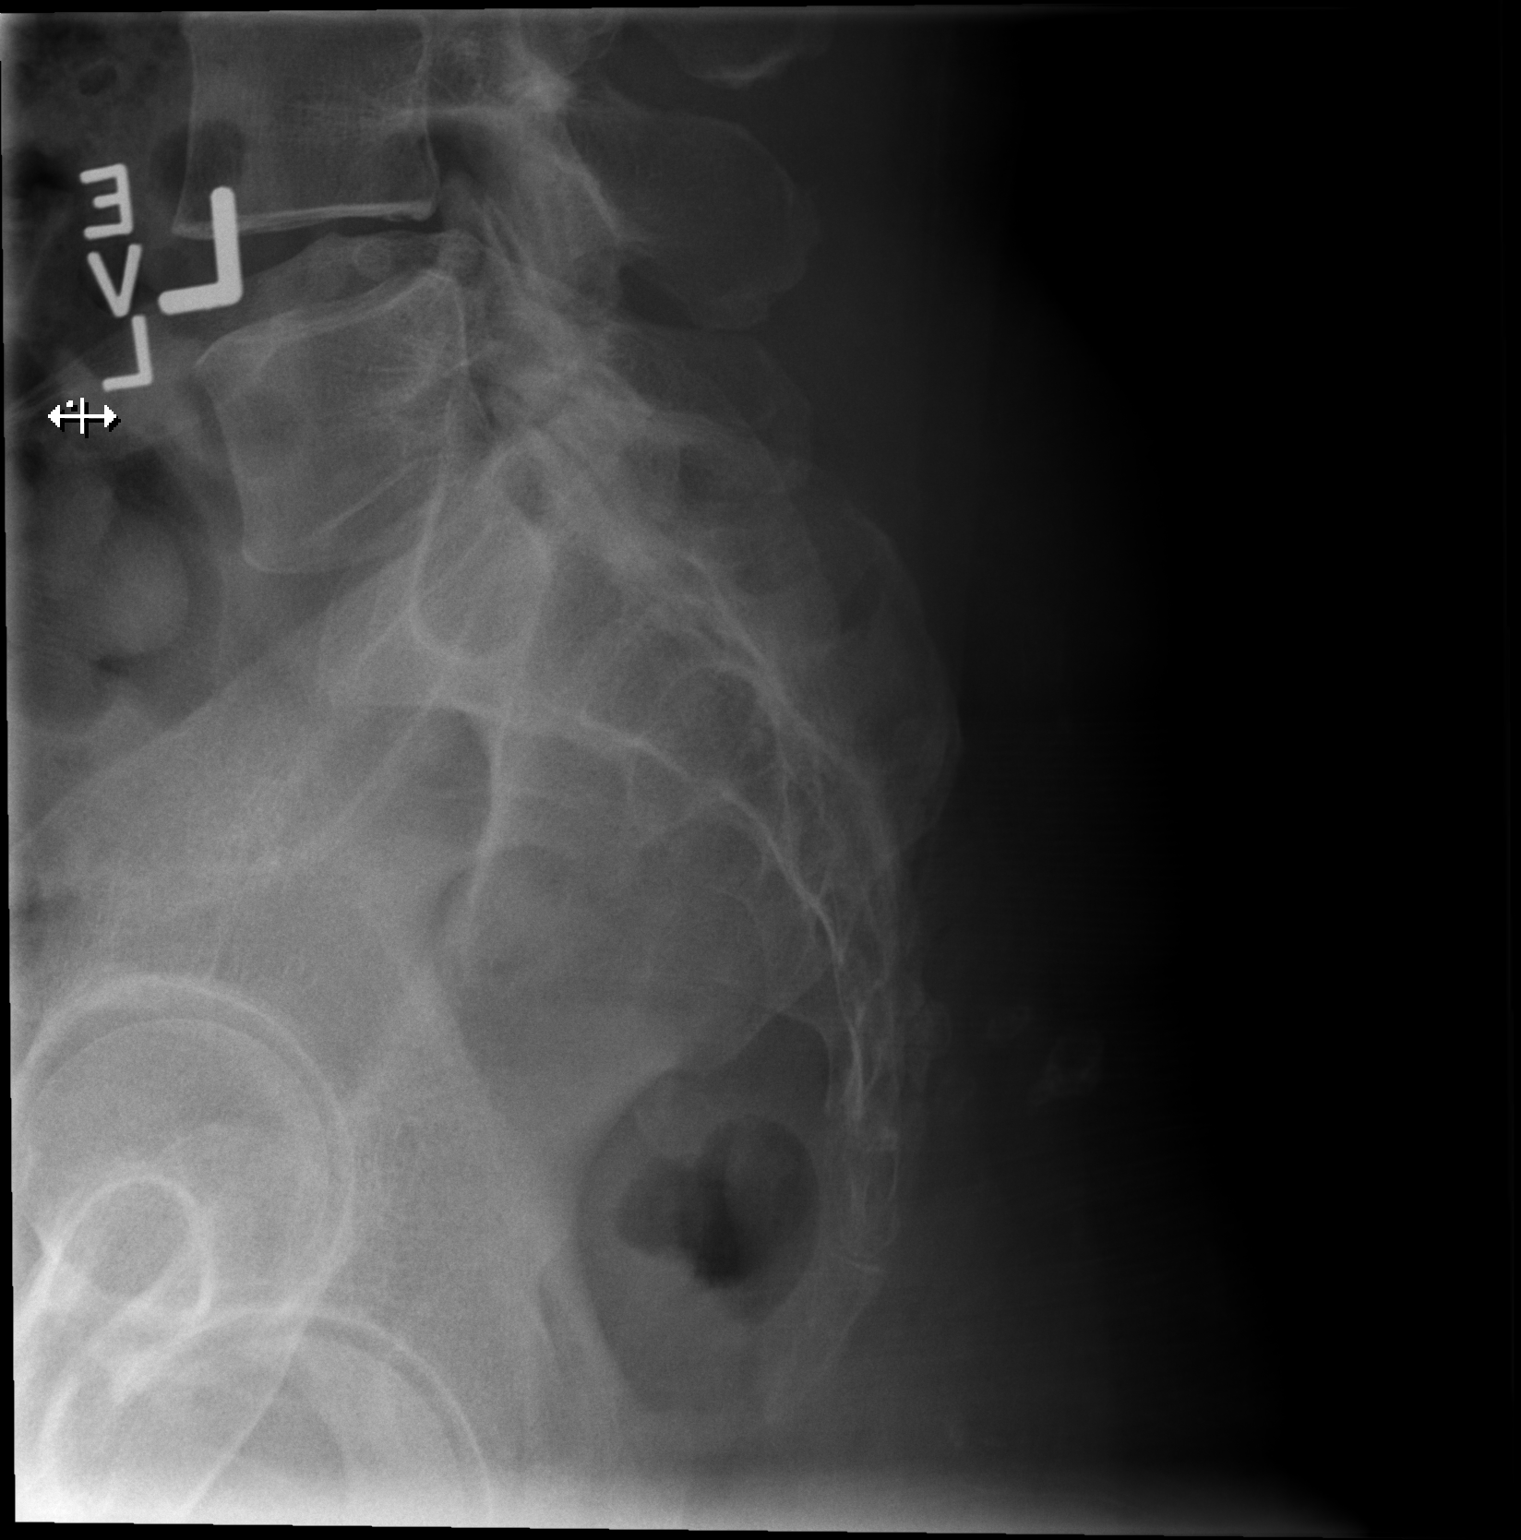

[3 of 3 positions shown; findings below may reference images not displayed]

FINDINGS: There is no evidence of lumbar spine fracture. Normal lumbar
segmentation. Alignment is normal. Intervertebral disc spaces are
maintained. IVC filter is noted to the right of the lumbar spine
spanning L2 through L3.
IMPRESSION: Stable appearance of the lumbar spine without acute fracture,
malalignment nor bone destruction. Disc spaces are maintained.

## 2016-12-15 MED ORDER — HYDROCODONE-ACETAMINOPHEN 5-325 MG PO TABS
1.0000 | ORAL_TABLET | Freq: Once | ORAL | Status: AC
  Administered 2016-12-15: 1 via ORAL
  Filled 2016-12-15: qty 1

## 2016-12-15 MED ORDER — METHOCARBAMOL 500 MG PO TABS
500.0000 mg | ORAL_TABLET | Freq: Once | ORAL | Status: AC
  Administered 2016-12-15: 500 mg via ORAL
  Filled 2016-12-15: qty 1

## 2016-12-15 MED ORDER — ACETAMINOPHEN 500 MG PO TABS: 1000.0000 mg | ORAL_TABLET | Freq: Three times a day (TID) | ORAL | 0 refills | Status: AC

## 2016-12-15 MED ORDER — METHOCARBAMOL 500 MG PO TABS: 500.0000 mg | ORAL_TABLET | Freq: Two times a day (BID) | ORAL | 0 refills | Status: DC

## 2016-12-15 NOTE — ED Provider Notes (Signed)
[facility] DEPT Provider Note   CSN: [id number] Arrival date & time: 12/15/16  1802     History   Chief Complaint Chief Complaint  Patient presents with  . Back Pain    HPI [name] is a 46 y.o. female.  The history is provided by the patient.  Back Pain   This is a new problem. The current episode started 1 to 2 hours ago. The problem occurs constantly. The problem has not changed since onset.The pain is associated with lifting heavy objects. The pain is present in the lumbar spine. The quality of the pain is described as aching and cramping. The pain does not radiate. The pain is severe. The symptoms are aggravated by bending, twisting and certain positions. Pertinent negatives include no fever, no numbness, no bowel incontinence, no bladder incontinence and no paresthesias. She has tried nothing for the symptoms.  All other systems are reviewed and are negative for acute change except as noted in the HPI   Past Medical History:  Diagnosis Date  . CTEPH (chronic thromboembolic pulmonary hypertension) ([place])   . DVT (deep venous thrombosis) ([place])   . Hepatitis B infection    Hep B core Ab 04/2005  . Primary hypercoagulable state ([place])   . Pulmonary embolism ([place]) 2005      . Seasonal allergies     Patient Active Problem List   Diagnosis Date Noted  . Iron deficiency anemia 12/18/2015  . Lower extremity pain, bilateral 11/17/2015  . Prediabetes 06/30/2015  . Allergic rhinitis 03/11/2012  . Antiphospholipid syndrome ([place]) 05/24/2011  . Hepatitis B infection   . CTEPH (chronic thromboembolic pulmonary hypertension) ([place]) 10/05/2010  . Long term current use of anticoagulant 10/05/2010  . Preventative health care 09/28/2010    Past Surgical History:  Procedure Laterality Date  . CARDIAC CATHETERIZATION  11/2005   R heart cath : Severe pulmonary arterial HTN with evidence of cor pulmonale. Vasodilatory challenge not performed.  . Pulmonary artery  endarterectomy  2007  . VENA CAVA FILTER PLACEMENT      OB History    Gravida Para Term Preterm AB Living   4 3 3   1 3    SAB TAB Ectopic Multiple Live Births   1               Home Medications    Prior to Admission medications   Medication Sig Start Date End Date Taking? Authorizing Provider  albuterol (PROVENTIL HFA;VENTOLIN HFA) 108 (90 BASE) MCG/ACT inhaler Inhale 2 puffs into the lungs daily as needed for shortness of breath. For shortness of breath or wheezing 08/26/14  Yes Chesley Mires, MD  ferrous sulfate 325 (65 FE) MG tablet Take 1 tablet (325 mg total) by mouth daily with breakfast. 12/22/15  Yes Janith Lima, MD  fluticasone (FLONASE) 50 MCG/ACT nasal spray Place 2 sprays into both nostrils daily. 09/18/15 01/20/18 Yes Chesley Mires, MD  rivaroxaban (XARELTO) 20 MG TABS tablet Take 1 tablet (20 mg total) by mouth daily with supper. 10/21/16  Yes Chesley Mires, MD  acetaminophen (TYLENOL) 500 MG tablet Take 2 tablets (1,000 mg total) by mouth every 8 (eight) hours. Do not take more than 4000 mg of acetaminophen (Tylenol) in a 24-hour period. Please note that other medicines that you may be prescribed may have Tylenol as well. 12/15/16 12/20/16  Fatima Blank, MD  methocarbamol (ROBAXIN) 500 MG tablet Take 1 tablet (500 mg total) by mouth 2 (two) times daily. 12/15/16   Cardama,  Grayce Sessions, MD    Family History Family History  Problem Relation Age of Onset  . Hypertension Mother   . Hypertension Father        deceased  . Alcohol abuse Neg Hx   . Cancer Neg Hx   . Diabetes Neg Hx   . Early death Neg Hx   . Heart disease Neg Hx   . Hyperlipidemia Neg Hx   . Kidney disease Neg Hx   . Stroke Neg Hx     Social History Social History  Substance Use Topics  . Smoking status: Never Smoker  . Smokeless tobacco: Never Used  . Alcohol use No     Allergies   Aspirin   Review of Systems Review of Systems  Constitutional: Negative for fever.    Gastrointestinal: Negative for bowel incontinence.  Genitourinary: Negative for bladder incontinence.  Musculoskeletal: Positive for back pain.  Neurological: Negative for numbness and paresthesias.     Physical Exam Updated Vital Signs BP 139/77 (BP Location: Right Arm)   Pulse 75   Temp 97.6 F (36.4 C) (Oral)   Resp 18   Ht 5' 4"  (1.626 m)   Wt 184 lb (83.5 kg)   SpO2 100%   BMI 31.58 kg/m   Physical Exam  Constitutional: She is oriented to person, place, and time. She appears well-developed and well-nourished. No distress.  HENT:  Head: Normocephalic and atraumatic.  Nose: Nose normal.  Eyes: Conjunctivae and EOM are normal. Pupils are equal, round, and reactive to light. Right eye exhibits no discharge. Left eye exhibits no discharge. No scleral icterus.  Neck: Normal range of motion. Neck supple.  Cardiovascular: Normal rate and regular rhythm.  Exam reveals no gallop and no friction rub.   No murmur heard. Pulmonary/Chest: Effort normal and breath sounds normal. No stridor. No respiratory distress. She has no rales.  Abdominal: Soft. She exhibits no distension. There is no tenderness.  Musculoskeletal: She exhibits no edema or tenderness.  Neurological: She is alert and oriented to person, place, and time.  Spine Exam: Strength: 5/5 throughout LE bilaterally (hip flexion/extension, adduction/abduction; knee flexion/extension; foot dorsiflexion/plantarflexion, inversion/eversion; great toe inversion) Sensation: Intact to light touch in proximal and distal LE bilaterally Reflexes: 2+ quadriceps and achilles reflexes  Skin: Skin is warm and dry. No rash noted. She is not diaphoretic. No erythema.  Psychiatric: She has a normal mood and affect.  Vitals reviewed.    ED Treatments / Results  Labs (all labs ordered are listed, but only abnormal results are displayed) Labs Reviewed - No data to display  EKG  EKG Interpretation None       Radiology Dg Lumbar  Spine 2-3 Views  Result Date: 12/15/2016 CLINICAL DATA:  Lumbar pain after lifting a heavy box at work today. EXAM: LUMBAR SPINE - 2-3 VIEW COMPARISON:  Reformats of the lumbar spine from a prior CT dated 04/18/2014 FINDINGS: There is no evidence of lumbar spine fracture. Normal lumbar segmentation. Alignment is normal. Intervertebral disc spaces are maintained. IVC filter is noted to the right of the lumbar spine spanning L2 through L3. IMPRESSION: Stable appearance of the lumbar spine without acute fracture, malalignment nor bone destruction. Disc spaces are maintained. Electronically Signed   By: Ashley Royalty M.D.   On: 12/15/2016 19:42    Procedures Procedures (including critical care time)  Medications Ordered in ED Medications  HYDROcodone-acetaminophen (NORCO/VICODIN) 5-325 MG per tablet 1 tablet (1 tablet Oral Given 12/15/16 1851)  methocarbamol (ROBAXIN) tablet 500 mg (  500 mg Oral Given 12/15/16 1851)     Initial Impression / Assessment and Plan / ED Course  I have reviewed the triage vital signs and the nursing notes.  Pertinent labs & imaging results that were available during my care of the patient were reviewed by me and considered in my medical decision making (see chart for details).     46 y.o. female presents with back pain in lumbar area for 3 hours without signs of radicular pain. No acute traumatic onset. No red flag symptoms of fever, weight loss, saddle anesthesia, weakness, fecal/urinary incontinence or urinary retention.   Plain film without evidence of acute fracture or bony lesion.  Suspect MSK etiology. No indication for imaging emergently. Patient was recommended to take short course of scheduled tylenol and engage in early mobility as definitive treatment. Return precautions discussed for worsening or new concerning symptoms.   We'll provide patient with a prescription for muscle relaxers. Also discussed additional symptomatic treatment such as heat/ice therapy,  topical medication, massage therapy, stretching.  The patient is safe for discharge with strict return precautions.  Final Clinical Impressions(s) / ED Diagnoses   Final diagnoses:  Strain of lumbar region, initial encounter   Disposition: Discharge  Condition: Good  I have discussed the results, Dx and Tx plan with the patient who expressed understanding and agree(s) with the plan. Discharge instructions discussed at great length. The patient was given strict return precautions who verbalized understanding of the instructions. No further questions at time of discharge.    New Prescriptions   ACETAMINOPHEN (TYLENOL) 500 MG TABLET    Take 2 tablets (1,000 mg total) by mouth every 8 (eight) hours. Do not take more than 4000 mg of acetaminophen (Tylenol) in a 24-hour period. Please note that other medicines that you may be prescribed may have Tylenol as well.   METHOCARBAMOL (ROBAXIN) 500 MG TABLET    Take 1 tablet (500 mg total) by mouth 2 (two) times daily.    Follow Up: Janith Lima, MD [address] [id number] [phone]  Schedule an appointment as soon as possible for a visit  As needed      Cardama, Grayce Sessions, MD 12/15/16 2007

## 2016-12-15 NOTE — ED Triage Notes (Signed)
Per [facility] pt from [facility], was lifting a heavy box and felt a sharp pain in her lower back. On arrival EMS states pt was already in a wheelchair and stated she could not ambulate to stretcher. Pt denies any previous injury to back.

## 2017-02-01 ENCOUNTER — Telehealth: Payer: Self-pay | Admitting: Pulmonary Disease

## 2017-02-01 NOTE — Telephone Encounter (Signed)
[id number] x1 

## 2017-02-03 MED ORDER — ALBUTEROL SULFATE HFA 108 (90 BASE) MCG/ACT IN AERS: 2.0000 | INHALATION_SPRAY | Freq: Every day | RESPIRATORY_TRACT | 5 refills | Status: DC | PRN

## 2017-02-03 NOTE — Telephone Encounter (Signed)
Called and spoke with pt and she is aware of refill that has been sent to the pharmacy.

## 2017-04-06 ENCOUNTER — Ambulatory Visit (INDEPENDENT_AMBULATORY_CARE_PROVIDER_SITE_OTHER)
Admission: RE | Admit: 2017-04-06 | Discharge: 2017-04-06 | Disposition: A | Discharge status: Home / Self Care | Payer: BLUE CROSS/BLUE SHIELD | Source: Ambulatory Visit | Attending: Family | Admitting: Family

## 2017-04-06 ENCOUNTER — Encounter: Payer: Self-pay | Admitting: Family

## 2017-04-06 ENCOUNTER — Ambulatory Visit (INDEPENDENT_AMBULATORY_CARE_PROVIDER_SITE_OTHER): Payer: BLUE CROSS/BLUE SHIELD | Admitting: Family

## 2017-04-06 VITALS — BP 120/78 | HR 88 | Temp 98.4°F | Resp 22 | Ht 64.0 in | Wt 194.0 lb

## 2017-04-06 DIAGNOSIS — R05 Cough: Secondary | ICD-10-CM

## 2017-04-06 DIAGNOSIS — R059 Cough, unspecified: Secondary | ICD-10-CM

## 2017-04-06 IMAGING — DX DG CHEST 2V
2 series · 2 of 2 positions shown · non-contrast
Comparison: Chest radiograph [DATE] chest CT [DATE]

CLINICAL DATA: Cough and congestion with chest pain

EXAM:
CHEST  2 VIEW

[chest pa]
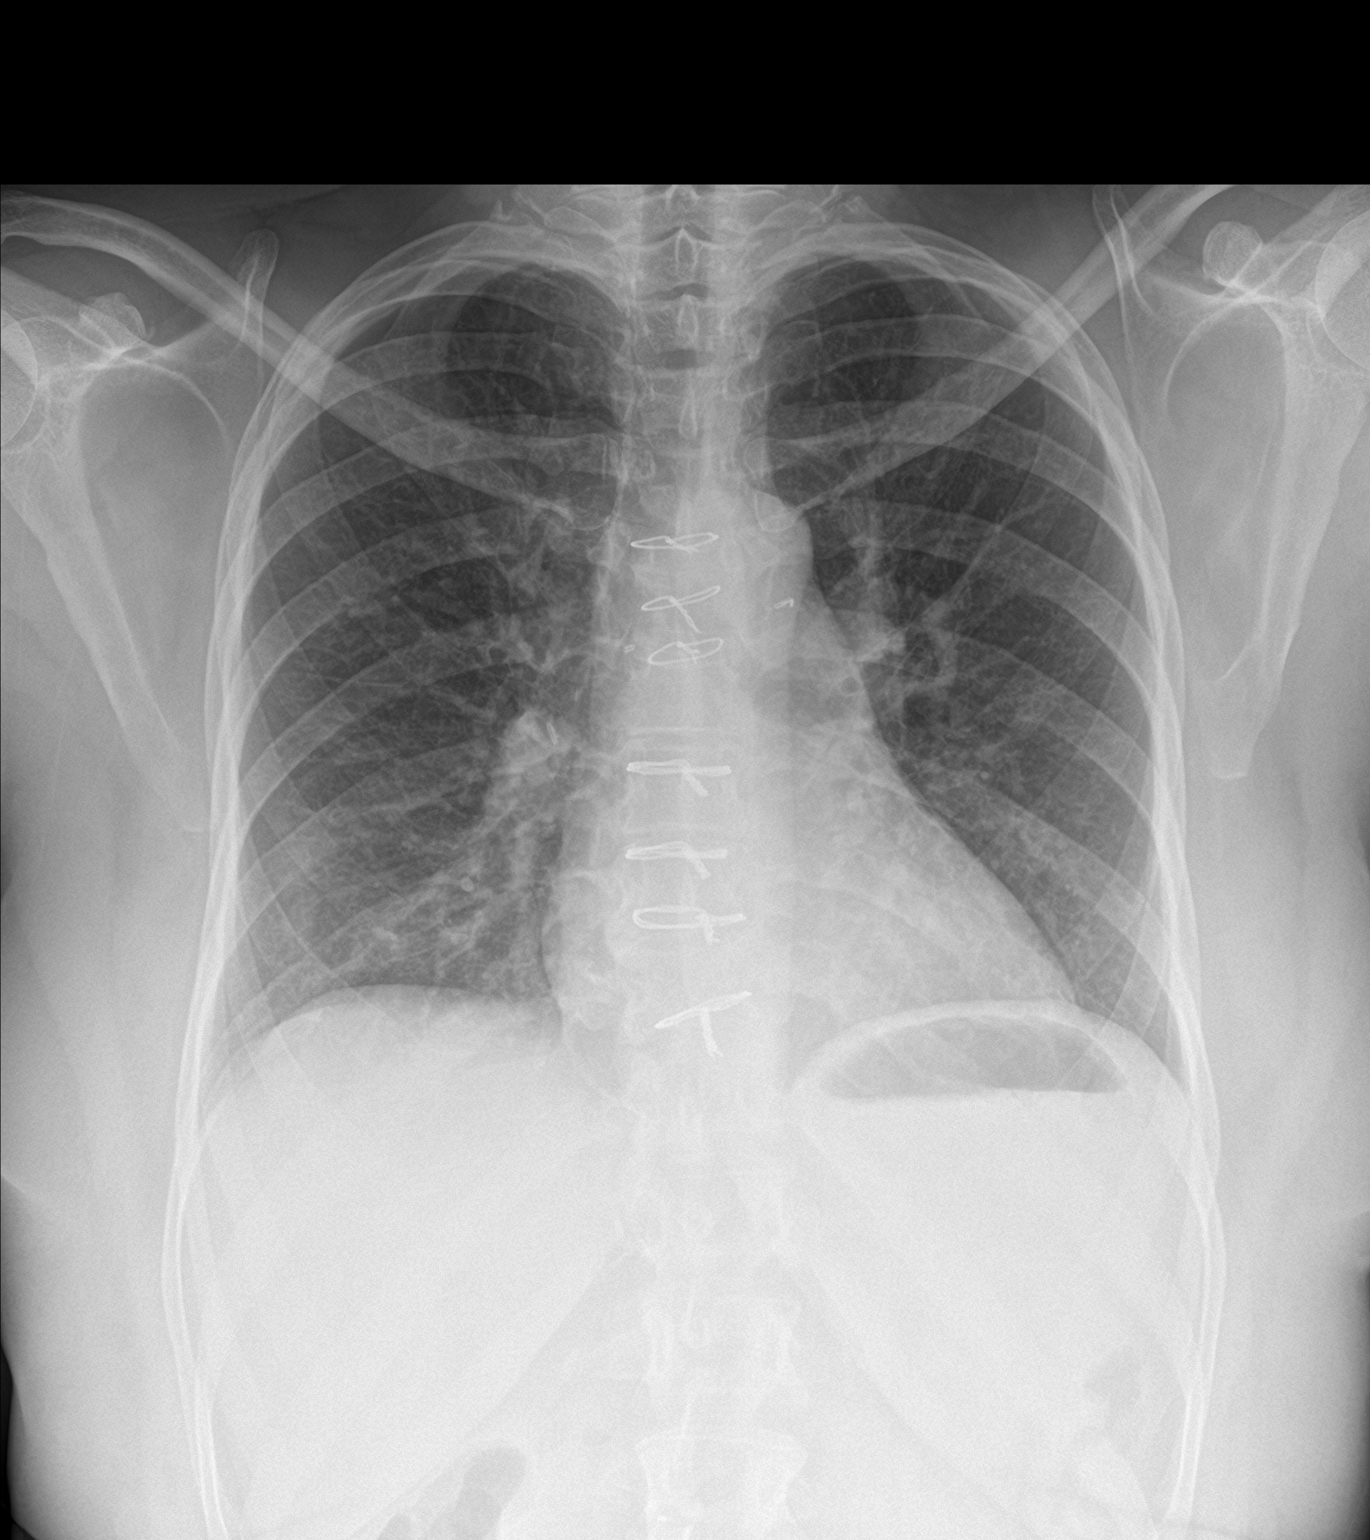

[chest lat]
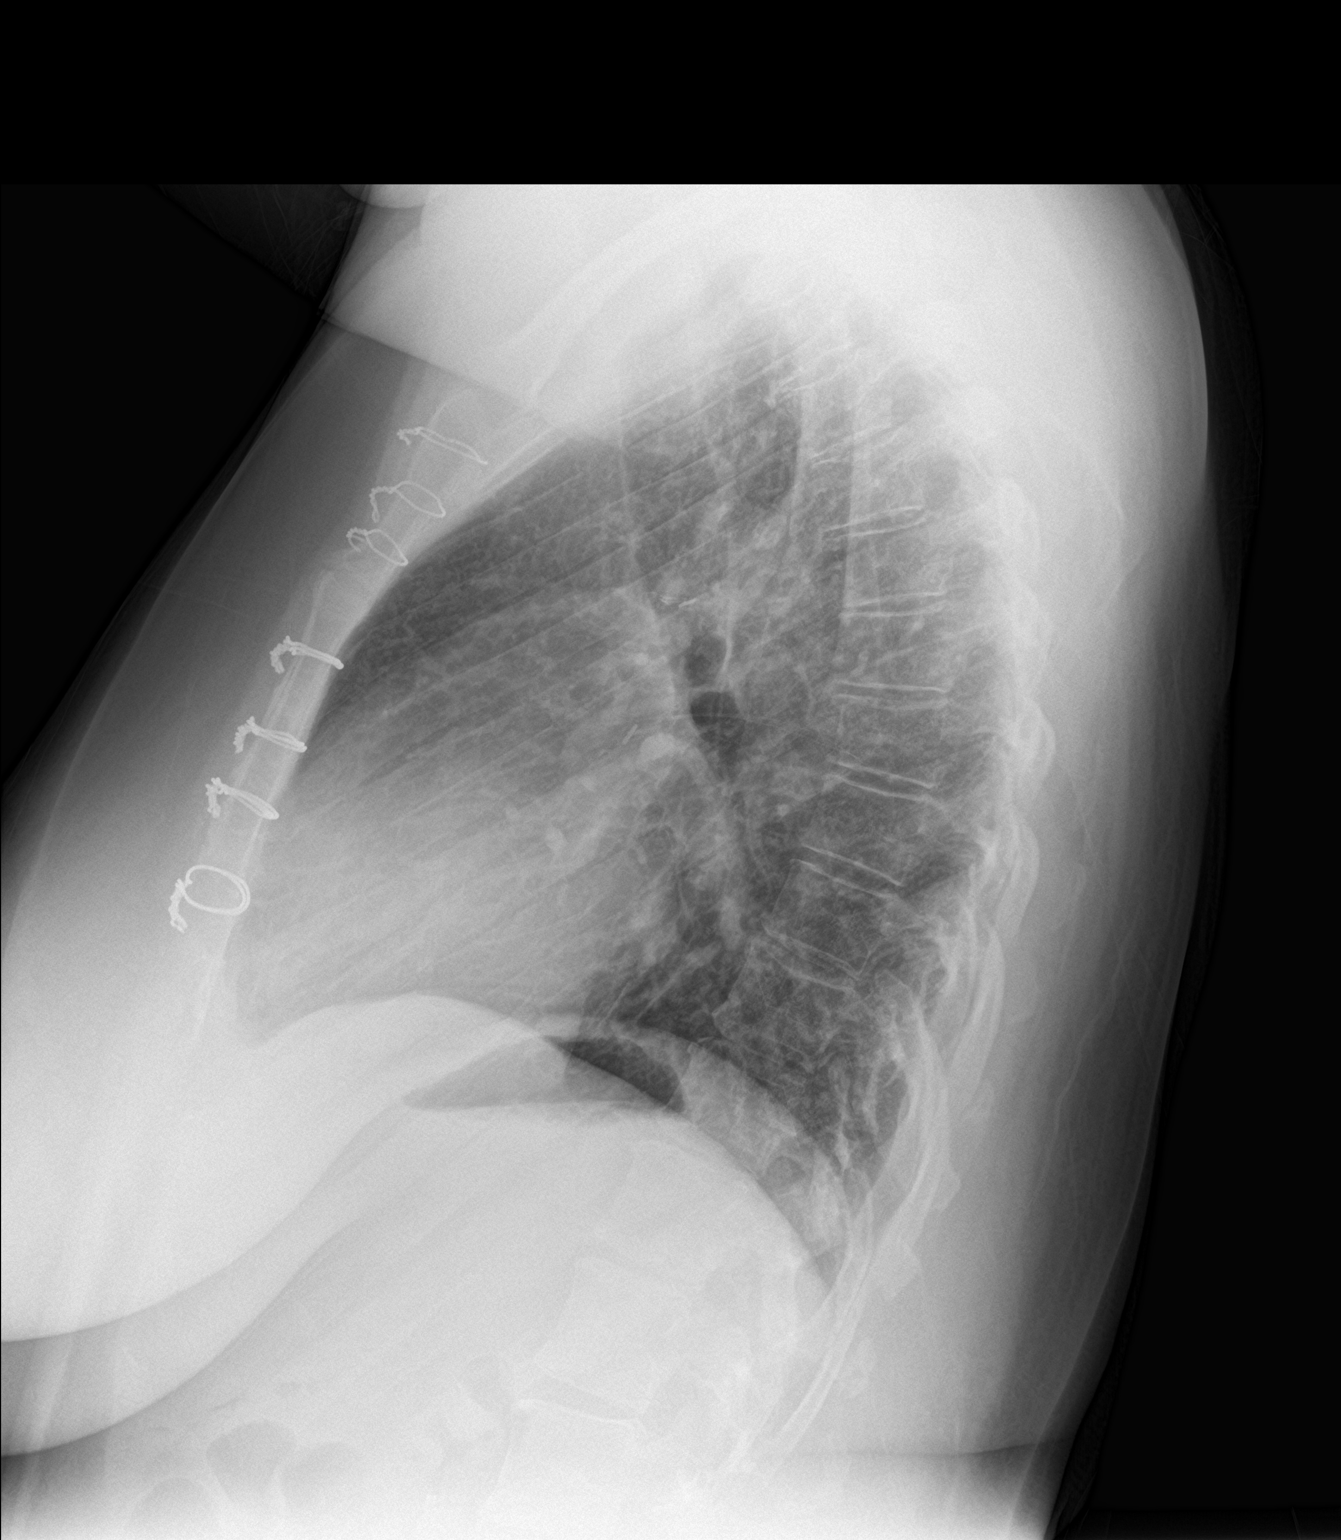

[2 of 2 positions shown; findings below may reference images not displayed]

FINDINGS: There is no edema or consolidation. The heart size and pulmonary
vascularity are normal. No adenopathy. Patient is status post median
sternotomy. No bone lesions. No pneumothorax.
IMPRESSION: No edema or consolidation.

## 2017-04-06 MED ORDER — AZITHROMYCIN 250 MG PO TABS: ORAL_TABLET | ORAL | 0 refills | Status: DC

## 2017-04-06 MED ORDER — PROMETHAZINE-CODEINE 6.25-10 MG/5ML PO SYRP: 5.0000 mL | ORAL_SOLUTION | Freq: Four times a day (QID) | ORAL | 0 refills | Status: DC | PRN

## 2017-04-06 NOTE — Assessment & Plan Note (Signed)
New onset with symptoms and exam concerning for bronchitis or pneumonia. Does not appear to be consistent with Dengue or malaria given recent travel to [place]. Obtain chest x-ray. Start azithromycin and promethazine-codeine. Continue OTC medications as needed for symptom relief and supportive care. Follow up pending x-ray results or if symptoms worsen or do not improve.

## 2017-04-06 NOTE — Patient Instructions (Signed)
Thank you for choosing [facility].  SUMMARY AND INSTRUCTIONS:  Please start the azithromycin.  We will check your chest x-ray today.  Rest and plenty of fluids.  Promethazine with codeine as needed for cough - may make you sleepy or drowsy.  Medication:  Your prescription(s) have been submitted to your pharmacy or been printed and provided for you. Please take as directed and contact our office if you believe you are having problem(s) with the medication(s) or have any questions.  Imaging / Radiology:  Please stop by radiology on the basement level of the building for your x-rays. Your results will be released to [facility] (or called to you) after review, usually within 72 hours after test completion. If any treatments or changes are necessary, you will be notified at that same time.  Follow up:  If your symptoms worsen or fail to improve, please contact our office for further instruction, or in case of emergency go directly to the emergency room at the closest medical facility.    General Recommendations:    Please drink plenty of fluids.  Get plenty of rest   Sleep in humidified air  Use saline nasal sprays  Netti pot   OTC Medications:  Decongestants - helps relieve congestion   Flonase (generic fluticasone) or Nasacort (generic triamcinolone) - please make sure to use the "cross-over" technique at a 45 degree angle towards the opposite eye as opposed to straight up the nasal passageway.   Sudafed (generic pseudoephedrine - Note this is the one that is available behind the pharmacy counter); Products with phenylephrine (-PE) may also be used but is often not as effective as pseudoephedrine.   If you have HIGH BLOOD PRESSURE - Coricidin HBP; AVOID any product that is -D as this contains pseudoephedrine which may increase your blood pressure.  Afrin (oxymetazoline) every 6-8 hours for up to 3 days.   Allergies - helps relieve runny nose, itchy eyes and  sneezing   Claritin (generic loratidine), Allegra (fexofenidine), or Zyrtec (generic cyrterizine) for runny nose. These medications should not cause drowsiness.  Note - Benadryl (generic diphenhydramine) may be used however may cause drowsiness  Cough -   Delsym or Robitussin (generic dextromethorphan)  Expectorants - helps loosen mucus to ease removal   Mucinex (generic guaifenesin) as directed on the package.  Headaches / General Aches   Tylenol (generic acetaminophen) - DO NOT EXCEED 3 grams (3,000 mg) in a 24 hour time period  Advil/Motrin (generic ibuprofen)   Sore Throat -   Salt water gargle   Chloraseptic (generic benzocaine) spray or lozenges / Sucrets (generic dyclonine)

## 2017-04-06 NOTE — Progress Notes (Signed)
Subjective:    Patient ID: [name], female    DOB: 06-02-1971, 46 y.o.   MRN: [id number]  Chief Complaint  Patient presents with  . Cough    cough that she states has some blood coming up with it, weakness, sore throat, x2 weeks    HPI:  [name] is a 46 y.o. female who  has a past medical history of CTEPH (chronic thromboembolic pulmonary hypertension) ([place]); DVT (deep venous thrombosis) ([place]); Hepatitis B infection; Primary hypercoagulable state ([employer]); Pulmonary embolism ([place]) (2005); and Seasonal allergies. and presents today for an acute office visit.  This is a new problem. Associated symptoms of sore throat, cough, congestion, and weakness have been going on for about 2 weeks. Denies fevers but feels like her body is warm outside. Cough is described as productive with some blood on occasion. Just recently returned from a trip from [place]. Modifying factors including cough syrup which did not help very much. Course of the symptoms has stayed about the same.   Allergies  Allergen Reactions  . Aspirin Other (See Comments)    Other Reaction: GI Upset Other Reaction: GI Upset Since being in [place], told she has an ulcer      Outpatient Medications Prior to Visit  Medication Sig Dispense Refill  . albuterol (PROVENTIL HFA;VENTOLIN HFA) 108 (90 Base) MCG/ACT inhaler Inhale 2 puffs into the lungs daily as needed for shortness of breath. For shortness of breath or wheezing 1 Inhaler 5  . fluticasone (FLONASE) 50 MCG/ACT nasal spray Place 2 sprays into both nostrils daily. 16 g 11  . rivaroxaban (XARELTO) 20 MG TABS tablet Take 1 tablet (20 mg total) by mouth daily with supper. 30 tablet 5  . ferrous sulfate 325 (65 FE) MG tablet Take 1 tablet (325 mg total) by mouth daily with breakfast. 60 tablet 5  . methocarbamol (ROBAXIN) 500 MG tablet Take 1 tablet (500 mg total) by mouth 2 (two) times daily. 20 tablet 0   No facility-administered medications prior to  visit.      Past Medical History:  Diagnosis Date  . CTEPH (chronic thromboembolic pulmonary hypertension) ([place])   . DVT (deep venous thrombosis) ([place])   . Hepatitis B infection    Hep B core Ab 04/2005  . Primary hypercoagulable state ([place])   . Pulmonary embolism ([place]) 2005      . Seasonal allergies       Review of Systems  Constitutional: Positive for fatigue. Negative for chills and fever.  HENT: Positive for congestion, ear pain and sore throat. Negative for sinus pain and sinus pressure.   Respiratory: Positive for cough. Negative for chest tightness, shortness of breath and wheezing.   Cardiovascular: Negative for chest pain, palpitations and leg swelling.  Neurological: Positive for headaches.      Objective:    BP 120/78 (BP Location: Left Arm, Patient Position: Sitting, Cuff Size: Large)   Pulse 88   Temp 98.4 F (36.9 C) (Oral)   Resp (!) 22   Ht 5' 4"  (1.626 m)   Wt 194 lb (88 kg)   SpO2 98%   BMI 33.30 kg/m  Nursing note and vital signs reviewed.  Physical Exam  Constitutional: She is oriented to person, place, and time. She appears well-developed and well-nourished. She appears ill. No distress.  HENT:  Right Ear: Hearing, tympanic membrane, external ear and ear canal normal.  Left Ear: Hearing, tympanic membrane, external ear and ear canal normal.  Nose: Nose  normal.  Mouth/Throat: Uvula is midline. No tonsillar abscesses.  Tonsilar edema and with small amount of exudate on bilateral tonsils.   Cardiovascular: Normal rate, regular rhythm, normal heart sounds and intact distal pulses.   Pulmonary/Chest: Effort normal and breath sounds normal.  Neurological: She is alert and oriented to person, place, and time.  Skin: Skin is warm and dry.  Psychiatric: She has a normal mood and affect. Her behavior is normal. Judgment and thought content normal.       Assessment & Plan:   Problem List Items Addressed This Visit      Other   Cough - Primary     New onset with symptoms and exam concerning for bronchitis or pneumonia. Does not appear to be consistent with Dengue or malaria given recent travel to [place]. Obtain chest x-ray. Start azithromycin and promethazine-codeine. Continue OTC medications as needed for symptom relief and supportive care. Follow up pending x-ray results or if symptoms worsen or do not improve.       Relevant Orders   DG Chest 2 View       I have discontinued Ms. Zakarian's ferrous sulfate and methocarbamol. I am also having her start on azithromycin. Additionally, I am having her maintain her fluticasone, rivaroxaban, albuterol, and promethazine-codeine.   Meds ordered this encounter  Medications  . azithromycin (ZITHROMAX) 250 MG tablet    Sig: Take 2 tablets by mouth for 1 day then 1 tablet by mouth daily for 4 days.    Dispense:  6 tablet    Refill:  0    Order Specific Question:   Supervising Provider    Answer:   Pricilla Holm A [[id number]]  . DISCONTD: promethazine-codeine (PHENERGAN WITH CODEINE) 6.25-10 MG/5ML syrup    Sig: Take 5 mLs by mouth every 6 (six) hours as needed.    Dispense:  180 mL    Refill:  0    Order Specific Question:   Supervising Provider    Answer:   Pricilla Holm A [[id number]]  . promethazine-codeine (PHENERGAN WITH CODEINE) 6.25-10 MG/5ML syrup    Sig: Take 5 mLs by mouth every 6 (six) hours as needed.    Dispense:  180 mL    Refill:  0    Order Specific Question:   Supervising Provider    Answer:   Pricilla Holm A [[id number]]     Follow-up: Return if symptoms worsen or fail to improve.  Mauricio Po, FNP

## 2017-04-08 ENCOUNTER — Telehealth: Payer: Self-pay | Admitting: Internal Medicine

## 2017-04-08 NOTE — Telephone Encounter (Signed)
Pt returned your call regarding the x-ray results. I gave her Greg's response. She expressed understanding and did not have any questions at this time.

## 2017-04-08 NOTE — Telephone Encounter (Signed)
Noted  

## 2017-04-13 ENCOUNTER — Other Ambulatory Visit: Payer: Self-pay | Admitting: Internal Medicine

## 2017-04-13 DIAGNOSIS — Z1231 Encounter for screening mammogram for malignant neoplasm of breast: Secondary | ICD-10-CM

## 2017-05-19 ENCOUNTER — Ambulatory Visit
Admission: RE | Admit: 2017-05-19 | Discharge: 2017-05-19 | Disposition: A | Discharge status: Home / Self Care | Payer: BLUE CROSS/BLUE SHIELD | Source: Ambulatory Visit | Attending: Internal Medicine | Admitting: Internal Medicine

## 2017-05-19 ENCOUNTER — Ambulatory Visit: Payer: Self-pay

## 2017-05-19 DIAGNOSIS — Z1231 Encounter for screening mammogram for malignant neoplasm of breast: Secondary | ICD-10-CM

## 2017-05-19 LAB — HM MAMMOGRAPHY

## 2017-05-19 IMAGING — MG DIGITAL SCREENING BILATERAL MAMMOGRAM WITH CAD
4 series · 4 of 4 positions shown · non-contrast
Comparison: Previous exam(s).

CLINICAL DATA: Screening.

EXAM:
DIGITAL SCREENING BILATERAL MAMMOGRAM WITH CAD

[R MLO]
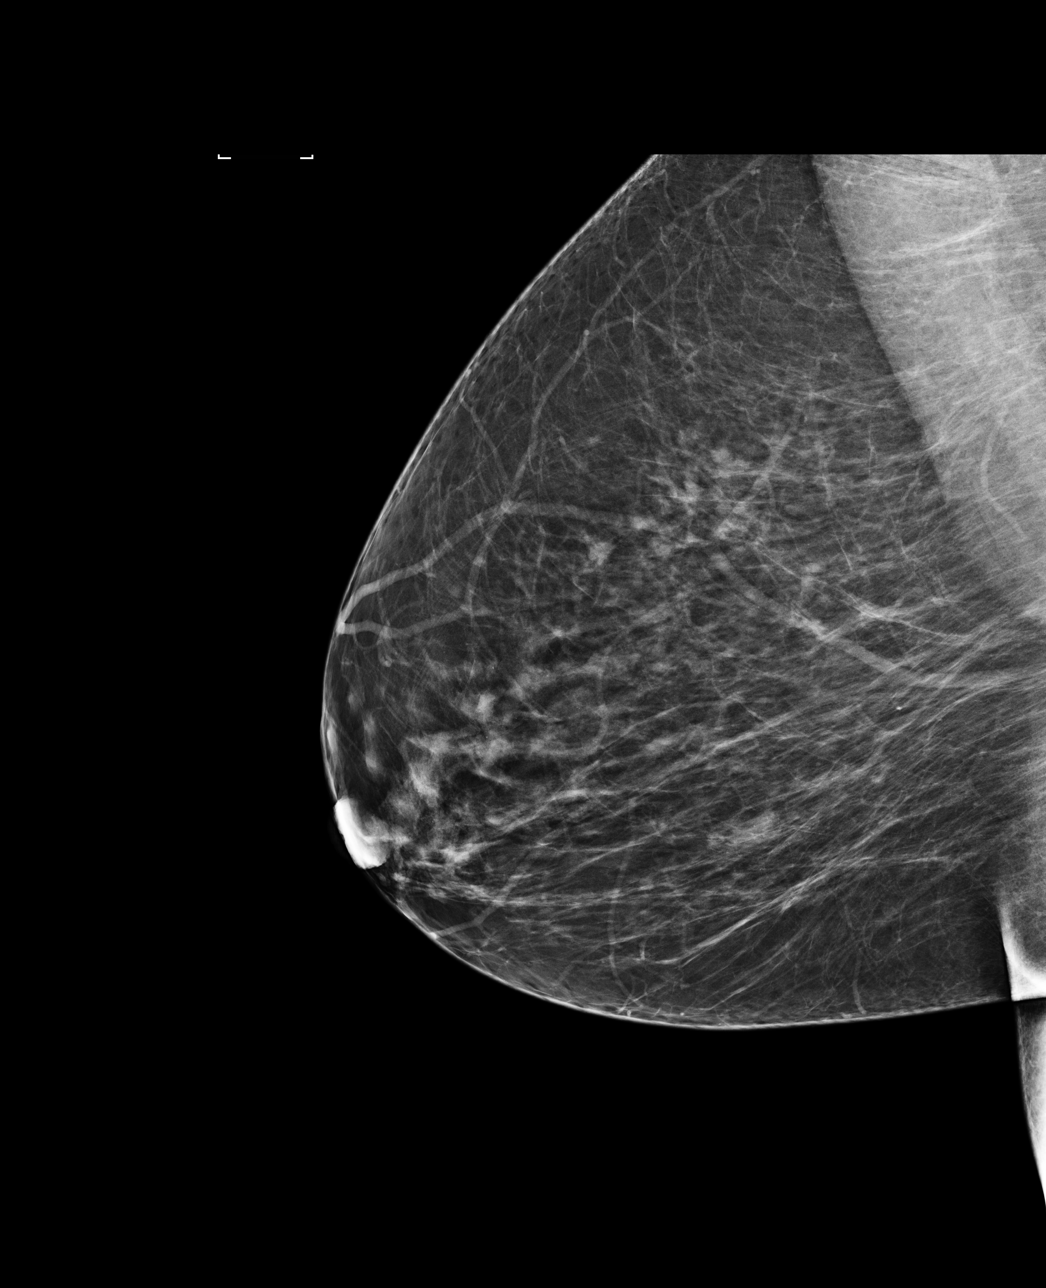

[L MLO]
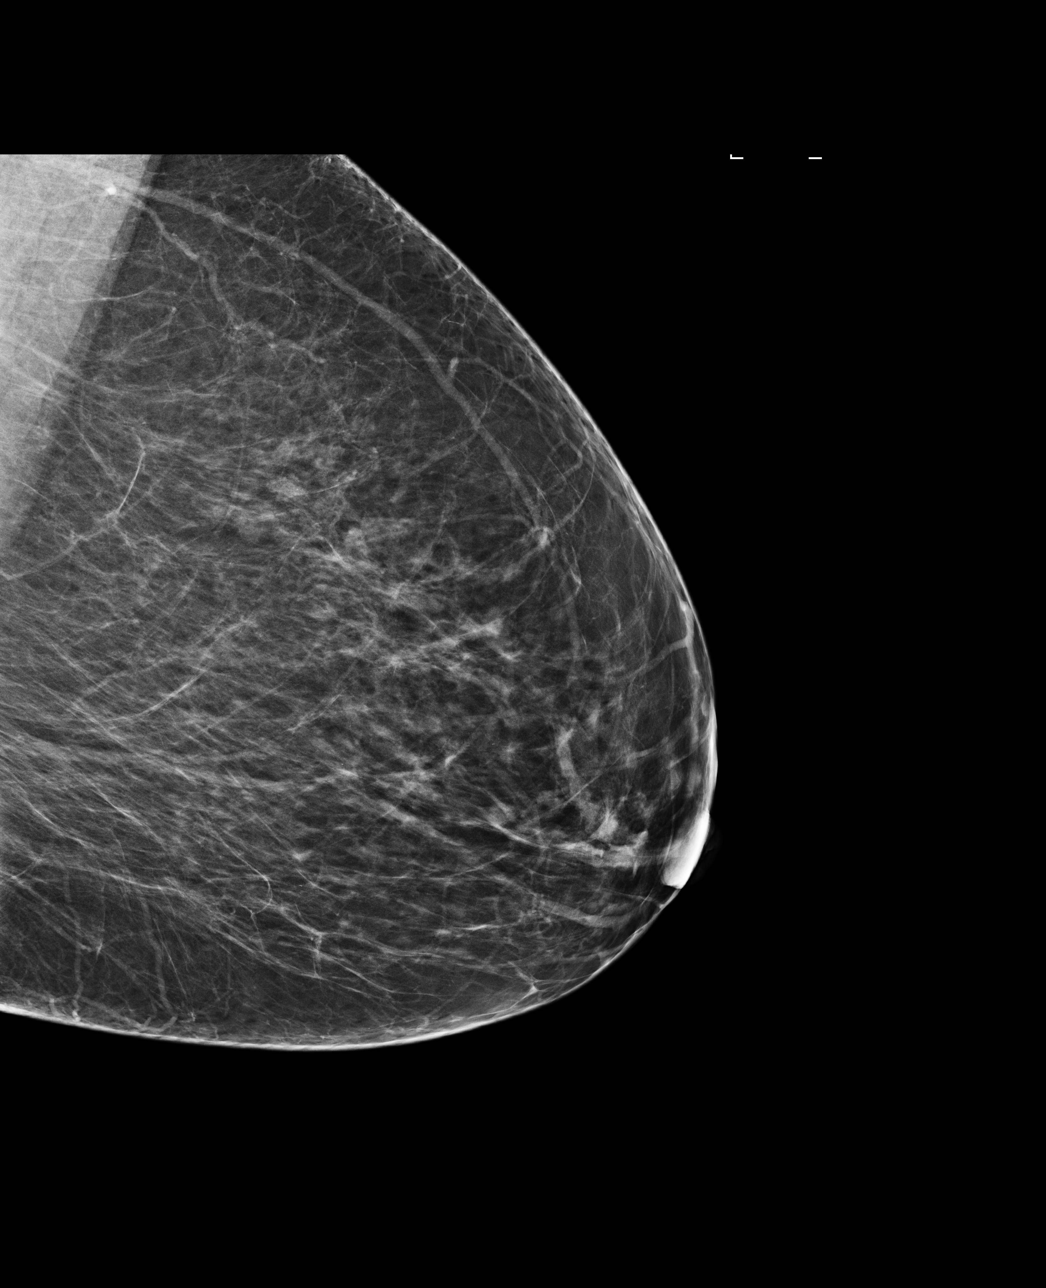

[R CC]
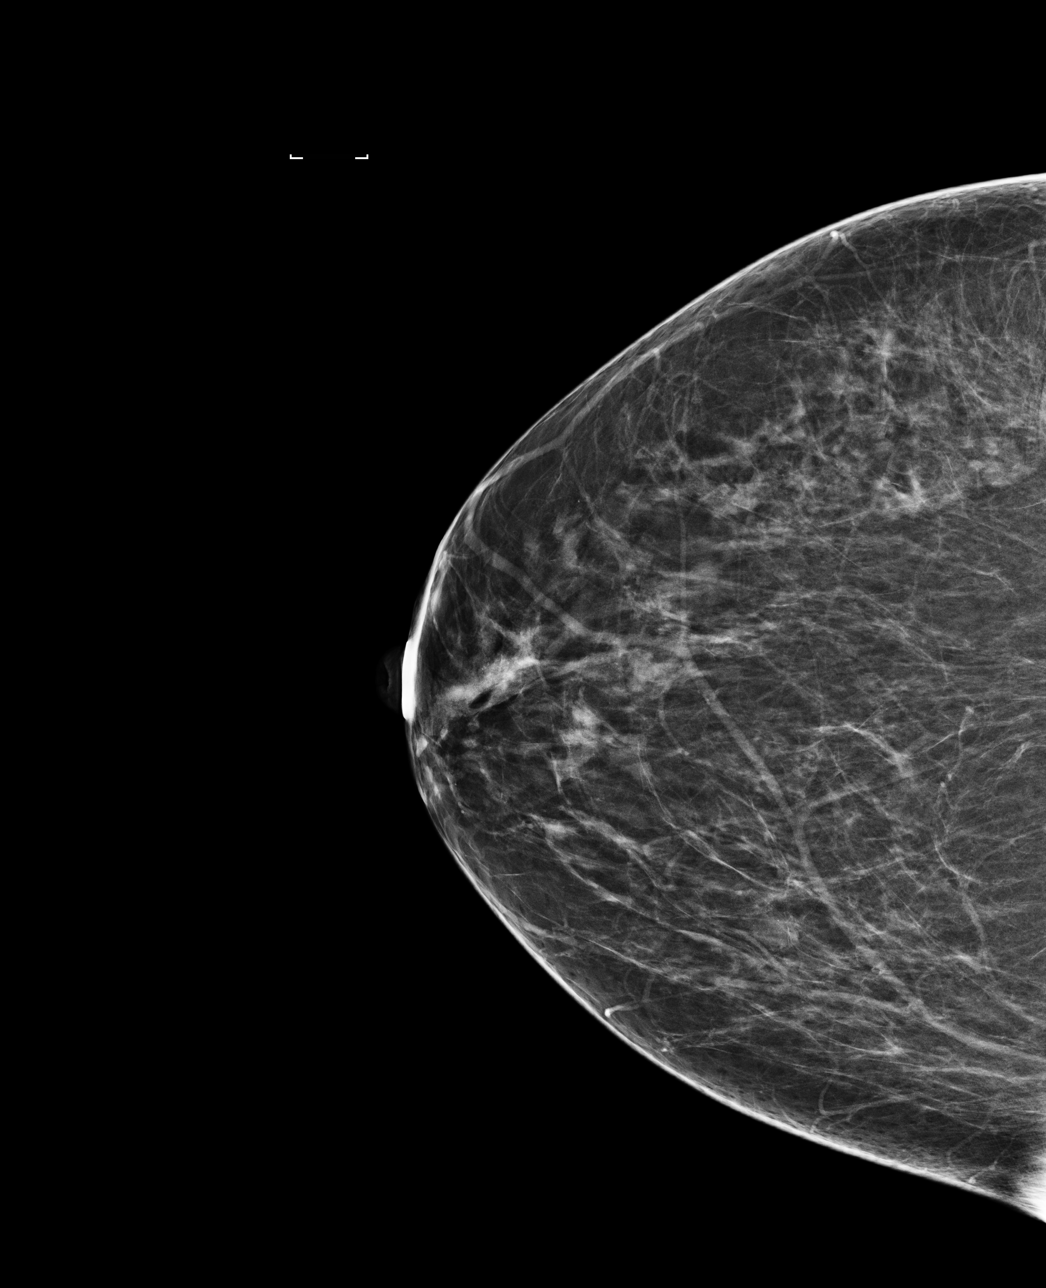

[L CC]
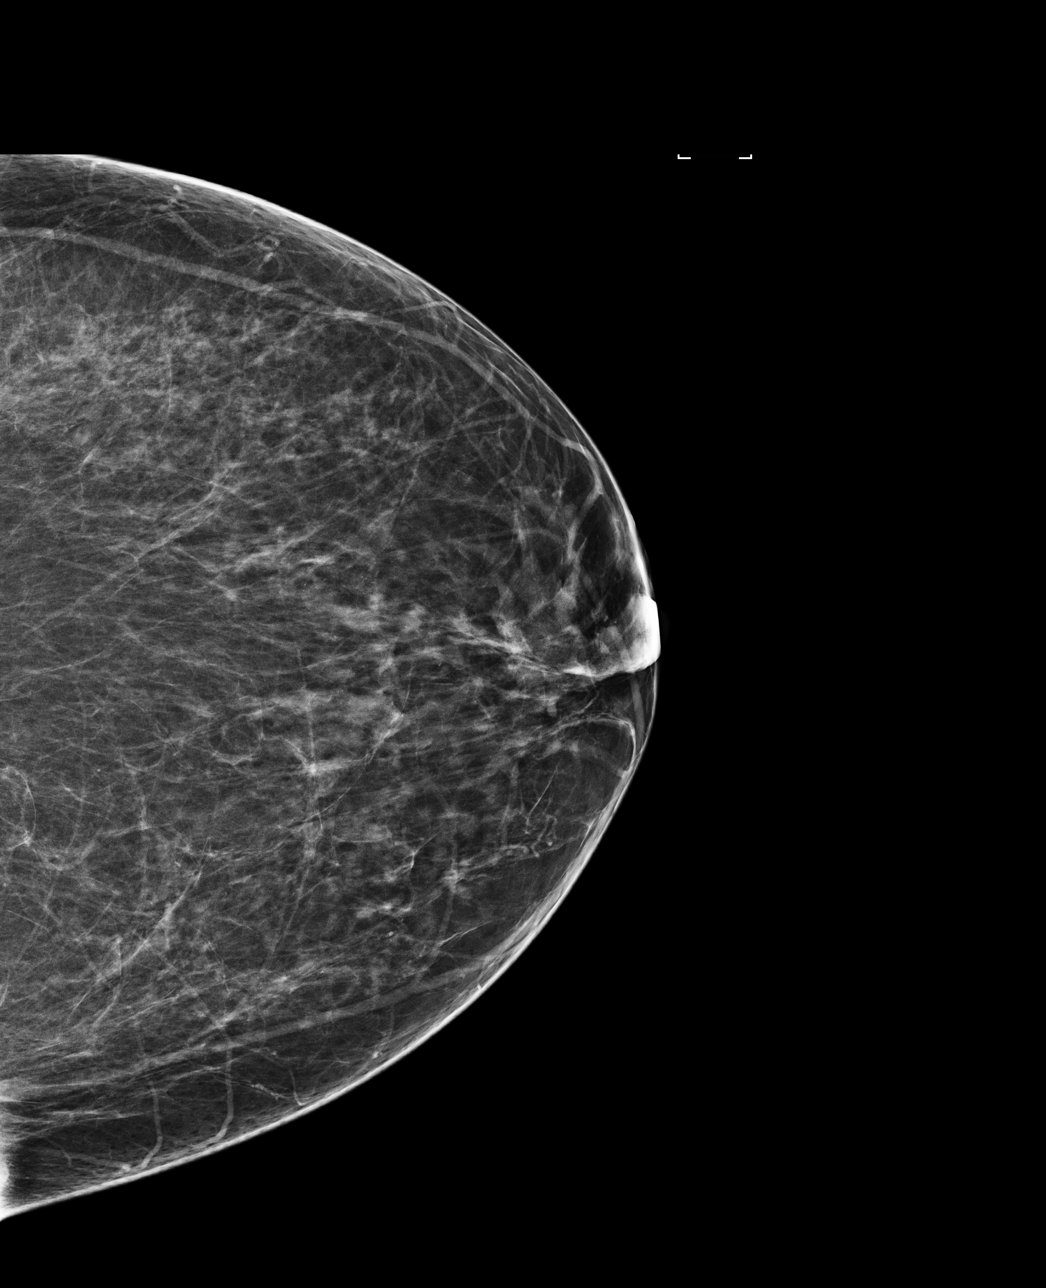

[4 of 4 positions shown; findings below may reference images not displayed]

ACR Breast Density Category b: There are scattered areas of
fibroglandular density.
FINDINGS: There are no findings suspicious for malignancy. Images were
processed with CAD.
IMPRESSION: No mammographic evidence of malignancy. A result letter of this
screening mammogram will be mailed directly to the patient.

RECOMMENDATION:
Screening mammogram in one year. (Code:[US])

BI-RADS CATEGORY  1: Negative.

## 2017-06-06 ENCOUNTER — Ambulatory Visit: Payer: Self-pay

## 2017-07-15 ENCOUNTER — Other Ambulatory Visit: Payer: Self-pay | Admitting: Pulmonary Disease

## 2017-09-30 ENCOUNTER — Ambulatory Visit: Payer: Self-pay | Admitting: Obstetrics & Gynecology

## 2017-10-10 ENCOUNTER — Encounter: Payer: Self-pay | Admitting: Pulmonary Disease

## 2017-10-10 ENCOUNTER — Other Ambulatory Visit (INDEPENDENT_AMBULATORY_CARE_PROVIDER_SITE_OTHER): Payer: BLUE CROSS/BLUE SHIELD

## 2017-10-10 ENCOUNTER — Ambulatory Visit: Payer: BLUE CROSS/BLUE SHIELD | Admitting: Pulmonary Disease

## 2017-10-10 VITALS — BP 110/70 | HR 83 | Ht 63.0 in | Wt 175.0 lb

## 2017-10-10 DIAGNOSIS — I2724 Chronic thromboembolic pulmonary hypertension: Secondary | ICD-10-CM | POA: Diagnosis not present

## 2017-10-10 LAB — COMPREHENSIVE METABOLIC PANEL
ALBUMIN: 4 g/dL (ref 3.5–5.2)
ALK PHOS: 63 U/L (ref 39–117)
ALT: 12 U/L (ref 0–35)
AST: 18 U/L (ref 0–37)
BUN: 7 mg/dL (ref 6–23)
CALCIUM: 10 mg/dL (ref 8.4–10.5)
CO2: 29 mEq/L (ref 19–32)
CREATININE: 0.87 mg/dL (ref 0.40–1.20)
Chloride: 102 mEq/L (ref 96–112)
GFR: 73.6 mL/min (ref >60.00)
Glucose, Bld: 105 mg/dL — ABNORMAL HIGH (ref 70–99)
Potassium: 3.9 mEq/L (ref 3.5–5.1)
Sodium: 137 mEq/L (ref 135–145)
TOTAL PROTEIN: 7.9 g/dL (ref 6.0–8.3)
Total Bilirubin: 0.5 mg/dL (ref 0.2–1.2)

## 2017-10-10 LAB — CBC
HEMATOCRIT: 41.3 % (ref 36.0–46.0)
Hemoglobin: 13.8 g/dL (ref 12.0–15.0)
MCHC: 33.4 g/dL (ref 30.0–36.0)
MCV: 84.6 fl (ref 78.0–100.0)
PLATELETS: 261 10*3/uL (ref 150.0–400.0)
RBC: 4.88 Mil/uL (ref 3.87–5.11)
RDW: 15.5 % (ref 11.5–15.5)
WBC: 4.5 10*3/uL (ref 4.0–10.5)

## 2017-10-10 MED ORDER — RIVAROXABAN 20 MG PO TABS: ORAL_TABLET | ORAL | 5 refills | Status: DC

## 2017-10-10 NOTE — Patient Instructions (Signed)
Lab tests today  Follow up in 1 year

## 2017-10-10 NOTE — Progress Notes (Signed)
[facility]  Chief Complaint  Patient presents with  . Follow-up    Pt states doing well overall. Pt is needing refill on Xarelto.     Vital signs: BP 110/70 (BP Location: Left Arm, Cuff Size: Normal)   Pulse 83   Ht 5' 3"  (1.6 m)   Wt 175 lb (79.4 kg)   SpO2 100%   BMI 31.00 kg/m   History of Present Illness: [name] is a 47 y.o. female with recurrent PE/DVT and chronic thromboembolic pulmonary hypertension on life long anticoagulation.  She had pulmonary endarterectomy at [facility] in 2007.  She remains on xarelto.  She gets heavy bleeding with menses.  Otherwise no bleeding concerns.  Denies chest pain, dyspnea, of swelling.  Had back injury at work and was needing pain meds.  Improved now, but still gets intermittent flare ups.   Physical Exam:  General - pleasant Eyes - pupils reactive ENT - no sinus tenderness, no oral exudate, no LAN Cardiac - regular, no murmur Chest - no wheeze, rales Abd - soft, non tender Ext - no edema Skin - no rashes Neuro - normal strength Psych - normal mood  Assessment/Plan:  Recurrent pulmonary embolism with CTEPH. - discussed with her risk/benefit of continuing anticoagulation and that benefit is more at this time, but could change if there is concern latter about risk of bleeding - she is tolerating xarelto well otherwise, and will continue this - repeat CMET, CBC today   Patient Instructions  Lab tests today  Follow up in 1 year    Chesley Mires, MD [place] 10/10/2017, 9:40 AM Pager:  [phone]  Flow Sheet  Pulmonary tests: V/Q scan 03/18/06 >> multiple b/l large mismatched segmental perfusion defects from chronic PE PFT 11/14/12 >> FEV1 2.52 (95%), FEV1% 92, TLC 3.75 (70%), DLCO 76%, no BD CT chest 12/14/12 >> 3 mm RUL nodule, scar LUL posterior segment V/Q scan 06/06/14 >> very low probability for PE CT chest 01/16/15 >> mild dilation of pulmonary  trunk  Cardiac tests Echo 06/11/14 >> EF 55 to 60%, mild TR and PR  Past Medical History: She  has a past medical history of CTEPH (chronic thromboembolic pulmonary hypertension) ([place]), DVT (deep venous thrombosis) ([place]), Hepatitis B infection, Primary hypercoagulable state ([place]), Pulmonary embolism ([place]) (2005), and Seasonal allergies.  Past Surgical History: She  has a past surgical history that includes Pulmonary artery endarterectomy (2007); Cardiac catheterization (11/2005); and Vena cava filter placement.  Family History: Her family history includes Hypertension in her father and mother.  Social History: She  reports that  has never smoked. she has never used smokeless tobacco. She reports that she does not drink alcohol or use drugs.  Medications: Allergies as of 10/10/2017      Reactions   Aspirin Other (See Comments)   Other Reaction: GI Upset Other Reaction: GI Upset Since being in [place], told she has an ulcer      Medication List        Accurate as of 10/10/17  9:40 AM. Always use your most recent med list.          albuterol 108 (90 Base) MCG/ACT inhaler Commonly known as:  PROVENTIL HFA;VENTOLIN HFA Inhale 2 puffs into the lungs daily as needed for shortness of breath. For shortness of breath or wheezing   fluticasone 50 MCG/ACT nasal spray Commonly known as:  FLONASE Place 2 sprays into both nostrils daily.   promethazine-codeine 6.25-10 MG/5ML syrup  Commonly known as:  PHENERGAN with CODEINE Take 5 mLs by mouth every 6 (six) hours as needed.   rivaroxaban 20 MG Tabs tablet Commonly known as:  XARELTO TAKE 1 TABLET BY MOUTH ONCE DAILY IN THE MORNING

## 2017-10-11 ENCOUNTER — Telehealth: Payer: Self-pay | Admitting: Pulmonary Disease

## 2017-10-11 NOTE — Telephone Encounter (Signed)
CMP Latest Ref Rng & Units 10/10/2017 10/21/2016 05/05/2016  Glucose 70 - 99 mg/dL 105(H) 100(H) 170(H)  BUN 6 - 23 mg/dL 7 13 11   Creatinine 0.40 - 1.20 mg/dL 0.87 0.90 0.96  Sodium 135 - 145 mEq/L 137 138 137  Potassium 3.5 - 5.1 mEq/L 3.9 3.8 3.6  Chloride 96 - 112 mEq/L 102 104 101  CO2 19 - 32 mEq/L 29 29 26   Calcium 8.4 - 10.5 mg/dL 10.0 9.7 9.3  Total Protein 6.0 - 8.3 g/dL 7.9 7.5 7.7  Total Bilirubin 0.2 - 1.2 mg/dL 0.5 0.4 0.8  Alkaline Phos 39 - 117 U/L 63 59 50  AST 0 - 37 U/L 18 19 17   ALT 0 - 35 U/L 12 13 12     CBC Latest Ref Rng & Units 10/10/2017 10/21/2016 05/05/2016  WBC 4.0 - 10.5 K/uL 4.5 5.0 4.8  Hemoglobin 12.0 - 15.0 g/dL 13.8 14.0 14.2  Hematocrit 36.0 - 46.0 % 41.3 41.6 41.0  Platelets 150.0 - 400.0 K/uL 261.0 287.0 238.0     Please let her know that her lab tests were normal.

## 2017-10-13 NOTE — Telephone Encounter (Signed)
Called and spoke with patient regarding results.  Informed the patient of results and recommendations today. Pt verbalized understanding and denied any questions or concerns at this time.  Nothing further needed.  

## 2017-10-21 ENCOUNTER — Encounter: Payer: Self-pay | Admitting: Obstetrics & Gynecology

## 2017-10-21 ENCOUNTER — Telehealth: Payer: Self-pay | Admitting: *Deleted

## 2017-10-21 ENCOUNTER — Ambulatory Visit: Payer: BLUE CROSS/BLUE SHIELD | Admitting: Obstetrics & Gynecology

## 2017-10-21 VITALS — BP 126/84 | HR 80 | Ht 64.0 in | Wt 176.5 lb

## 2017-10-21 DIAGNOSIS — Z01419 Encounter for gynecological examination (general) (routine) without abnormal findings: Secondary | ICD-10-CM

## 2017-10-21 DIAGNOSIS — Z124 Encounter for screening for malignant neoplasm of cervix: Secondary | ICD-10-CM

## 2017-10-21 DIAGNOSIS — Z01411 Encounter for gynecological examination (general) (routine) with abnormal findings: Secondary | ICD-10-CM

## 2017-10-21 DIAGNOSIS — R102 Pelvic and perineal pain: Secondary | ICD-10-CM

## 2017-10-21 DIAGNOSIS — Z1151 Encounter for screening for human papillomavirus (HPV): Secondary | ICD-10-CM

## 2017-10-21 LAB — HM PAP SMEAR

## 2017-10-21 NOTE — Progress Notes (Signed)
Subjective:    [name] is a 47 y.o.  Widowed P3 (29, 69, and 33 yo kids) female who presents for an annual exam. The patient has no complaints today except that sometimes she feel "pressure" and "heaviness" in her pelvis.  The patient is not currently sexually active. GYN screening history: last pap: was normal. The patient wears seatbelts: yes. The patient participates in regular exercise: no. Has the patient ever been transfused or tattooed?: no. The patient reports that there is not domestic violence in her life.   Menstrual History: OB History    Gravida  4   Para  3   Term  3   Preterm      AB  1   Living  3     SAB  1   TAB      Ectopic      Multiple      Live Births              Menarche age: 38 Patient's last menstrual period was 09/28/2017 (exact date).    The following portions of the patient's history were reviewed and updated as appropriate: allergies, current medications, past family history, past medical history, past social history, past surgical history and problem list.  Review of Systems Pertinent items are noted in HPI.   Works at [employer] on [address]- no breast/gyn/colon cancer Widowed since 2016, abstinent since then Mammogram 10/18 Periods are monthly, but not always on time Denies hot flashes   Objective:    BP 126/84   Pulse 80   Ht 5' 4"  (1.626 m)   Wt 80.1 kg (176 lb 8 oz)   LMP 09/28/2017 (Exact Date)   BMI 30.30 kg/m   General Appearance:    Alert, cooperative, no distress, appears stated age  Head:    Normocephalic, without obvious abnormality, atraumatic  Eyes:    PERRL, conjunctiva/corneas clear, EOM's intact, fundi    benign, both eyes  Ears:    Normal TM's and external ear canals, both ears  Nose:   Nares normal, septum midline, mucosa normal, no drainage    or sinus tenderness  Throat:   Lips, mucosa, and tongue normal; teeth and gums normal  Neck:   Supple, symmetrical, trachea midline, no adenopathy;   thyroid:  no enlargement/tenderness/nodules; no carotid   bruit or JVD  Back:     Symmetric, no curvature, ROM normal, no CVA tenderness  Lungs:     Clear to auscultation bilaterally, respirations unlabored  Chest Wall:    No tenderness or deformity   Heart:    Regular rate and rhythm, S1 and S2 normal, no murmur, rub   or gallop  Breast Exam:    No tenderness, masses, or nipple abnormality  Abdomen:     Soft, non-tender, bowel sounds active all four quadrants,    no masses, no organomegaly  Genitalia:    Normal female without lesion, discharge or tenderness, Uterus about 12 week size consistent with fibroids, no adnexal masses or pain     Extremities:   Extremities normal, atraumatic, no cyanosis or edema  Pulses:   2+ and symmetric all extremities  Skin:   Skin color, texture, turgor normal, no rashes or lesions  Lymph nodes:   Cervical, supraclavicular, and axillary nodes normal  Neurologic:   CNII-XII intact, normal strength, sensation and reflexes    throughout  .    Assessment:    Healthy female exam.   uterine enlargement, pelvic pressure Plan:  Thin prep Pap smear. with cotesting Check gyn u/s

## 2017-10-21 NOTE — Telephone Encounter (Signed)
I Called Mrs [name] and left a voice mail stating her u/s appt was now on 3/26@0830 . If any questions she may call us back at the clinic.

## 2017-10-25 ENCOUNTER — Telehealth: Payer: Self-pay | Admitting: *Deleted

## 2017-10-25 ENCOUNTER — Ambulatory Visit (HOSPITAL_COMMUNITY)
Admission: RE | Admit: 2017-10-25 | Discharge: 2017-10-25 | Disposition: A | Discharge status: Home / Self Care | Payer: BLUE CROSS/BLUE SHIELD | Source: Ambulatory Visit | Attending: Obstetrics & Gynecology | Admitting: Obstetrics & Gynecology

## 2017-10-25 ENCOUNTER — Ambulatory Visit (HOSPITAL_COMMUNITY): Payer: BLUE CROSS/BLUE SHIELD

## 2017-10-25 DIAGNOSIS — D259 Leiomyoma of uterus, unspecified: Secondary | ICD-10-CM | POA: Insufficient documentation

## 2017-10-25 DIAGNOSIS — R102 Pelvic and perineal pain: Secondary | ICD-10-CM | POA: Diagnosis present

## 2017-10-25 LAB — CYTOLOGY - PAP
DIAGNOSIS: NEGATIVE
HPV: NOT DETECTED

## 2017-10-25 IMAGING — US US TRANSVAGINAL NON-OB
1 series · 15 of 25 positions shown · non-contrast
Comparison: CT [DATE]

CLINICAL DATA: Pelvic pain and pressure

EXAM:
ULTRASOUND PELVIS TRANSVAGINAL
TECHNIQUE: Transvaginal ultrasound examination of the pelvis was performed
including evaluation of the uterus, ovaries, adnexal regions, and
pelvic cul-de-sac.

[Series 1: us transvaginal non-ob · 15 of 38 slices shown]
[im 1/38]
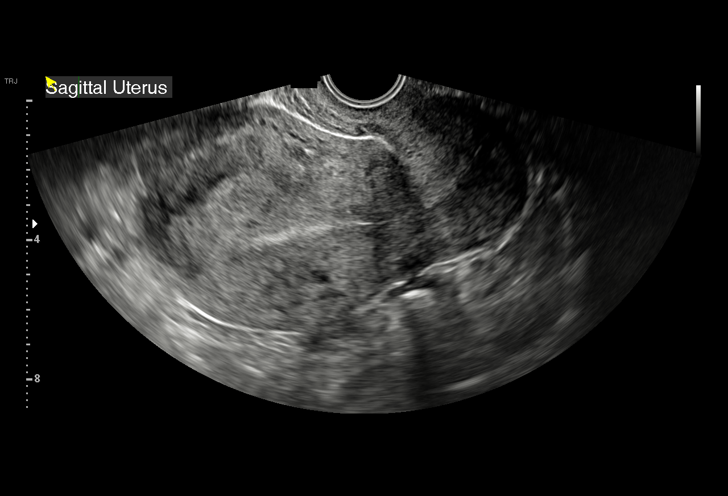
[im 4/38]
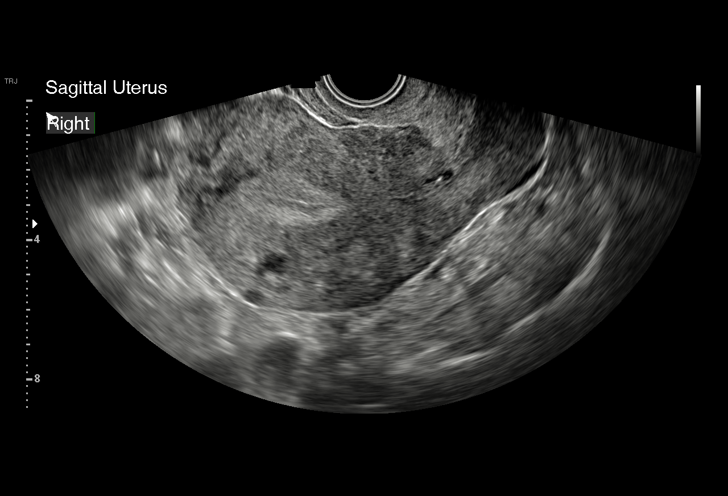
[im 7/38]
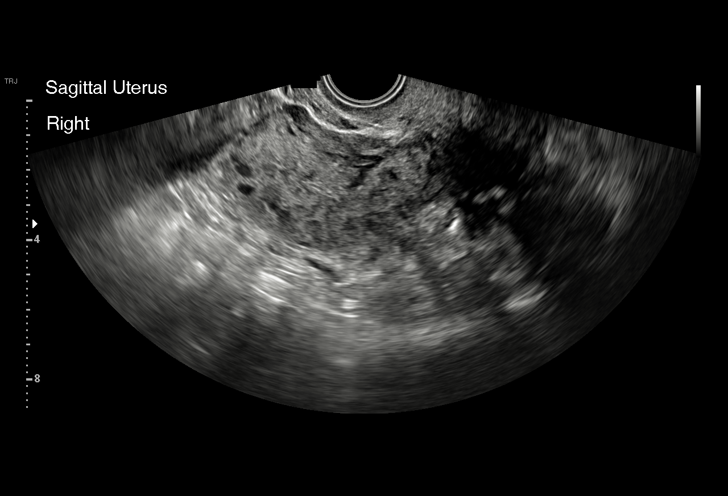
[im 8/38]
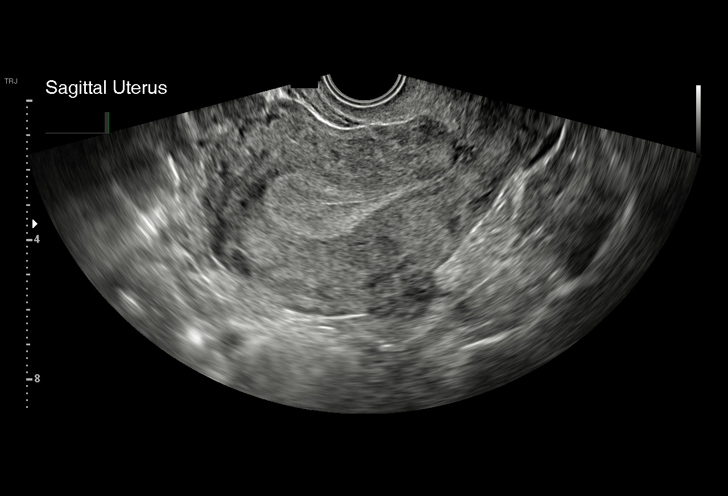
[im 11/38]
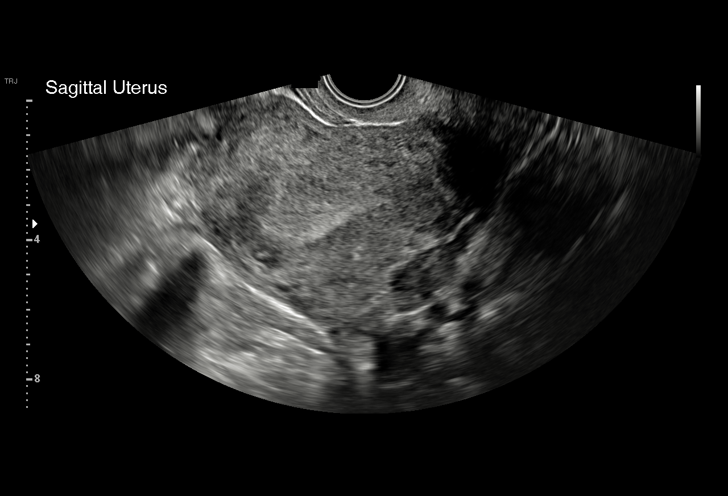
[im 14/38]
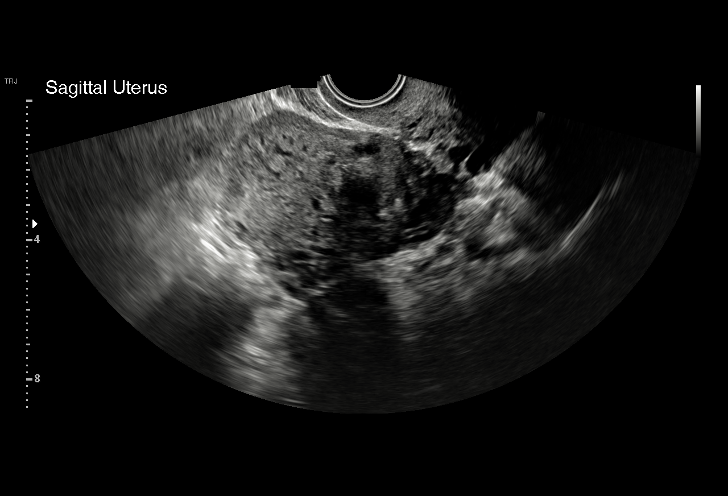
[im 16/38]
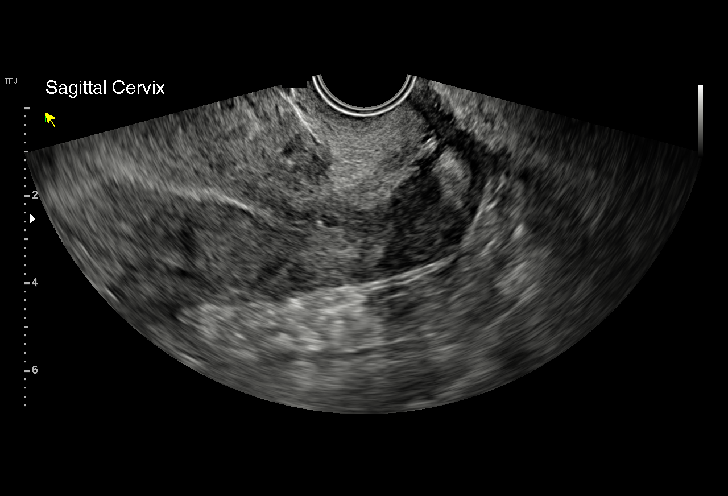
[im 19/38]
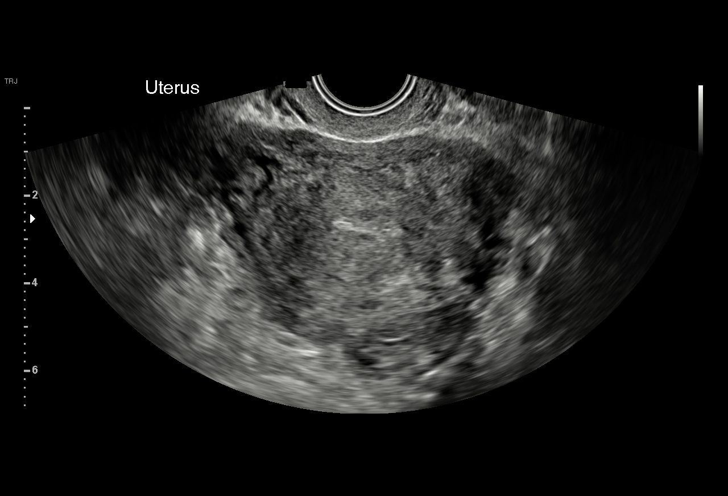
[im 22/38]
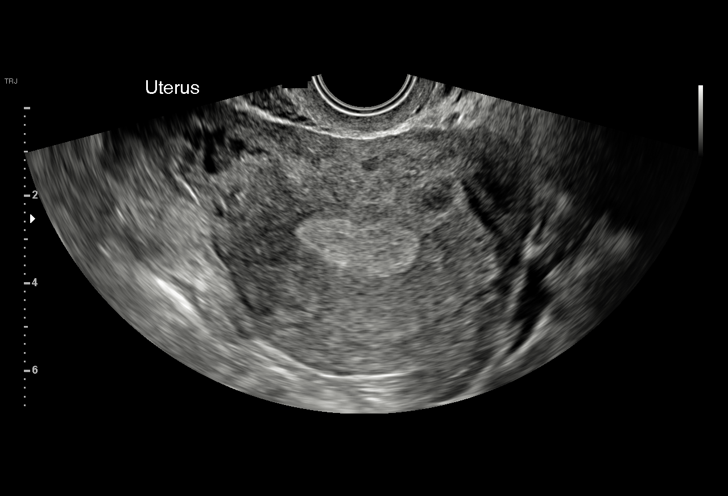
[im 24/38]
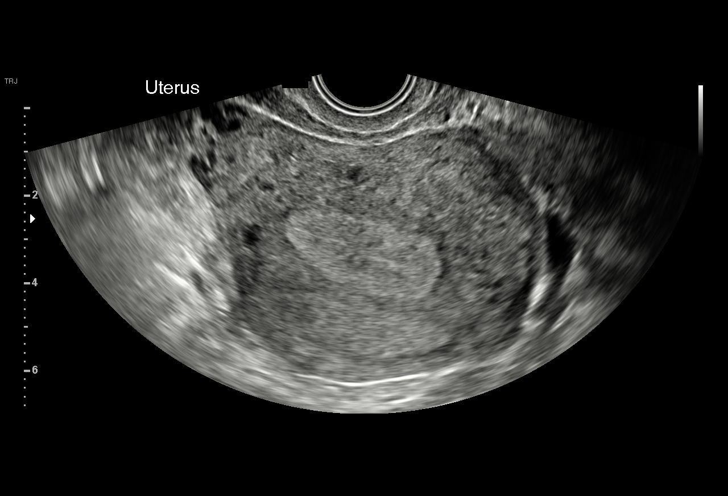
[im 27/38]
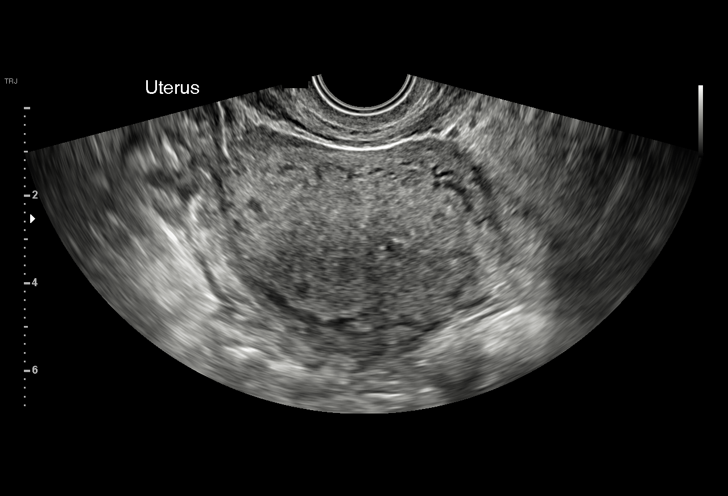
[im 30/38]
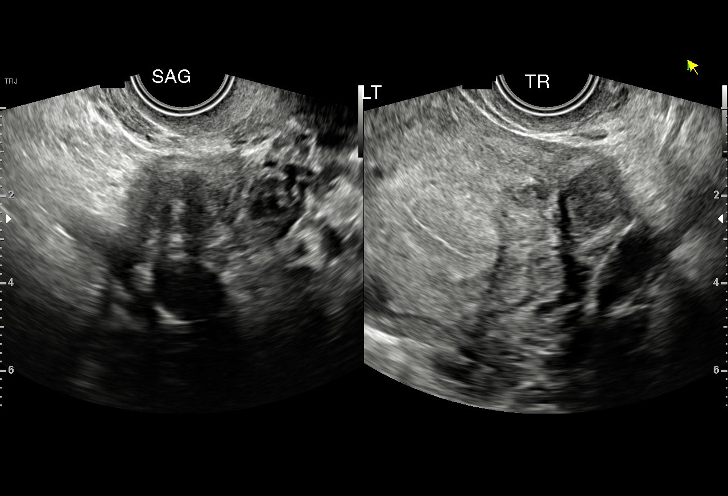
[im 31/38]
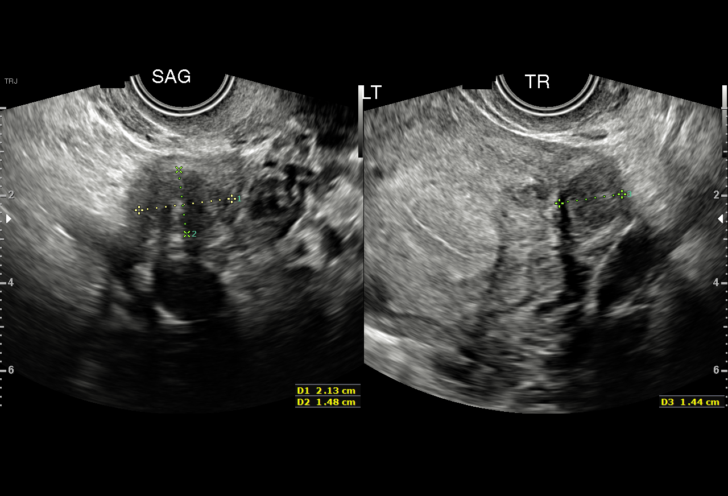
[im 34/38]
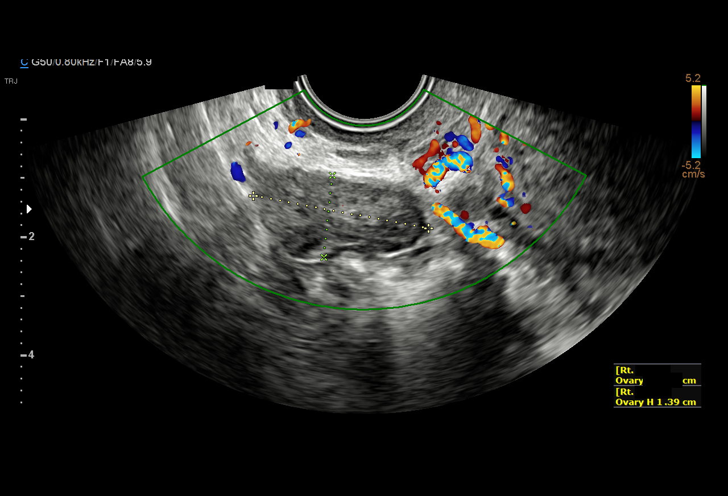
[im 38/38]
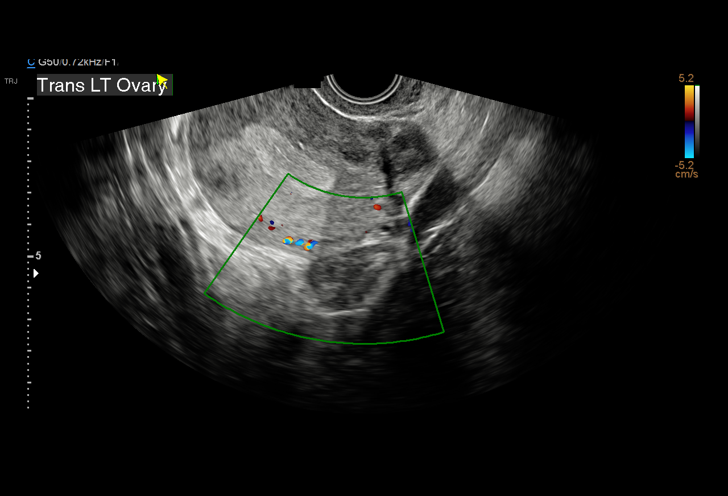

[15 of 25 positions shown; findings below may reference images not displayed]

FINDINGS: Uterus

Measurements: 10.9 x 6.4 x 6.8 cm. Small fibroids. Left anterior
fundal fibroid measures up to 2.1 cm. Right posterior intramural
fibroid measures up to 1.4 cm.

Endometrium

Thickness: 20 mm in thickness.  No focal abnormality visualized.

Right ovary

Measurements: 3.0 x 1.4 x 2.0 cm. Normal appearance/no adnexal mass.

Left ovary

Measurements: 3.3 x 1.9 x 2.3 cm. Normal appearance/no adnexal mass.

Other findings:  No abnormal free fluid
IMPRESSION: Small uterine fibroids.

Endometrial thickness is considered abnormal. Consider follow-up by
US in 6-8 weeks, during the week immediately following menses (exam
timing is critical).

## 2017-10-25 NOTE — Telephone Encounter (Signed)
Spoke with patient, gave u/s results and recommendation from Dr Hulan Fray about need for embx. Patient voiced understanding and would like to proceed. Will have front office call her to schedule procedure visit.

## 2017-10-28 ENCOUNTER — Telehealth: Payer: Self-pay | Admitting: General Practice

## 2017-10-28 NOTE — Telephone Encounter (Signed)
Called patient to schedule endo bx, but no answer and was unable to leave message for patient.  Patient has been scheduled for 11/23/17 at 3:55pm.  Appointment reminder will be mailed to patient.

## 2017-11-23 ENCOUNTER — Ambulatory Visit: Payer: Self-pay | Admitting: Obstetrics & Gynecology

## 2017-12-14 ENCOUNTER — Ambulatory Visit (INDEPENDENT_AMBULATORY_CARE_PROVIDER_SITE_OTHER): Payer: BLUE CROSS/BLUE SHIELD | Admitting: Obstetrics & Gynecology

## 2017-12-14 ENCOUNTER — Other Ambulatory Visit (HOSPITAL_COMMUNITY)
Admission: RE | Admit: 2017-12-14 | Discharge: 2017-12-14 | Disposition: A | Discharge status: Home / Self Care | Payer: BLUE CROSS/BLUE SHIELD | Source: Ambulatory Visit | Attending: Obstetrics & Gynecology | Admitting: Obstetrics & Gynecology

## 2017-12-14 ENCOUNTER — Encounter: Payer: Self-pay | Admitting: Obstetrics & Gynecology

## 2017-12-14 VITALS — BP 110/78 | HR 75 | Wt 175.0 lb

## 2017-12-14 DIAGNOSIS — R9389 Abnormal findings on diagnostic imaging of other specified body structures: Secondary | ICD-10-CM

## 2017-12-14 LAB — POCT PREGNANCY, URINE: PREG TEST UR: NEGATIVE

## 2017-12-14 NOTE — Progress Notes (Signed)
   Subjective:    Patient ID: [name], female    DOB: [birth year], 47 y.o.   MRN: [id number]  HPI 47 yo widowed P3 here for a EMBX due to a 20 mm endometrium seen on u/s which was done for her complaint of pelvic "heaviness".    Review of Systems     Objective:   Physical Exam Breathing, conversing, and ambulating normally Well nourished, well hydrated Black female, no apparent distress  UPT negative, consent signed, time out done Cervix prepped with betadine and grasped with a single tooth tenaculum Uterus sounded to 9 cm Pipelle used for 2 passes with a large amount of tissue obtained. She tolerated the procedure well.       Assessment & Plan:  Thickened endometrium- await EMBX results She would like me to call with results.

## 2017-12-23 ENCOUNTER — Telehealth: Payer: Self-pay | Admitting: *Deleted

## 2017-12-23 NOTE — Telephone Encounter (Signed)
Pt left message requesting test results from last week. I called pt and left message stating that test result was normal. No further treatment indicated @ this time.  She may call back if she has additional questions.

## 2017-12-27 ENCOUNTER — Encounter: Payer: Self-pay | Admitting: *Deleted

## 2018-01-27 ENCOUNTER — Emergency Department (HOSPITAL_COMMUNITY)
Admission: EM | Admit: 2018-01-27 | Discharge: 2018-01-28 | Disposition: A | Discharge status: Home / Self Care | Payer: BLUE CROSS/BLUE SHIELD | Attending: Emergency Medicine | Admitting: Emergency Medicine

## 2018-01-27 ENCOUNTER — Emergency Department (HOSPITAL_COMMUNITY): Payer: BLUE CROSS/BLUE SHIELD

## 2018-01-27 ENCOUNTER — Other Ambulatory Visit: Payer: Self-pay

## 2018-01-27 DIAGNOSIS — F410 Panic disorder [episodic paroxysmal anxiety] without agoraphobia: Secondary | ICD-10-CM | POA: Diagnosis not present

## 2018-01-27 DIAGNOSIS — Z7901 Long term (current) use of anticoagulants: Secondary | ICD-10-CM | POA: Diagnosis not present

## 2018-01-27 DIAGNOSIS — R404 Transient alteration of awareness: Secondary | ICD-10-CM | POA: Diagnosis present

## 2018-01-27 LAB — BASIC METABOLIC PANEL
Anion gap: 9 (ref 5–15)
BUN: 12 mg/dL (ref 6–20)
CHLORIDE: 104 mmol/L (ref 98–111)
CO2: 26 mmol/L (ref 22–32)
CREATININE: 1.12 mg/dL — AB (ref 0.44–1.00)
Calcium: 9.1 mg/dL (ref 8.9–10.3)
GFR calc non Af Amer: 57 mL/min — ABNORMAL LOW (ref >60)
GLUCOSE: 119 mg/dL — AB (ref 70–99)
Potassium: 4 mmol/L (ref 3.5–5.1)
Sodium: 139 mmol/L (ref 135–145)

## 2018-01-27 LAB — CBC
HCT: 42.5 % (ref 36.0–46.0)
Hemoglobin: 14.1 g/dL (ref 12.0–15.0)
MCH: 28.9 pg (ref 26.0–34.0)
MCHC: 33.2 g/dL (ref 30.0–36.0)
MCV: 87.1 fL (ref 78.0–100.0)
PLATELETS: 273 10*3/uL (ref 150–400)
RBC: 4.88 MIL/uL (ref 3.87–5.11)
RDW: 14.7 % (ref 11.5–15.5)
WBC: 5.4 10*3/uL (ref 4.0–10.5)

## 2018-01-27 LAB — I-STAT BETA HCG BLOOD, ED (MC, WL, AP ONLY): I-stat hCG, quantitative: <5 m[IU]/mL (ref <5)

## 2018-01-27 IMAGING — CT CT HEAD W/O CM
4 series · 16 of 47 positions shown, 18 images · non-contrast
Comparison: None.

CLINICAL DATA: Headache. Patient found on floor unresponsive and
passed out.

EXAM:
CT HEAD WITHOUT CONTRAST
TECHNIQUE: Contiguous axial images were obtained from the base of the skull
through the vertex without intravenous contrast.

[Series 3: head without · axial · non-contrast · 0.42mm/px · z∈[-103,+12]mm · 7 of 31 slices shown, 9 images]
[im 4/31  brain]
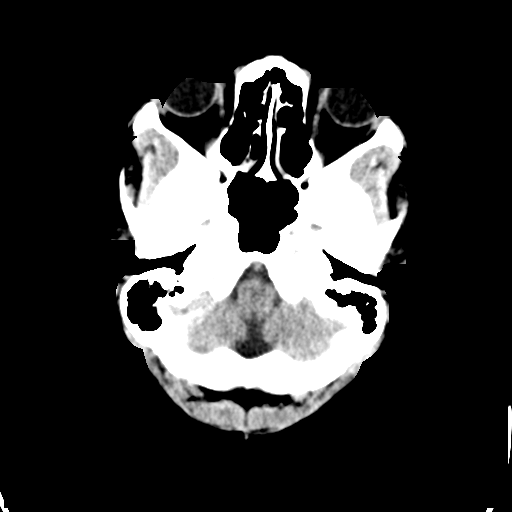
[im 4/31  bone]
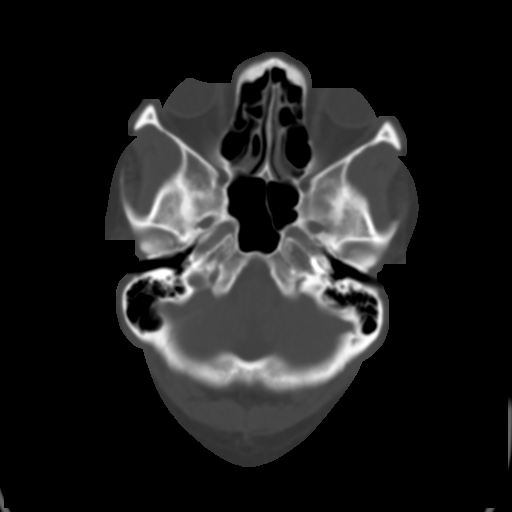
[im 8/31  brain]
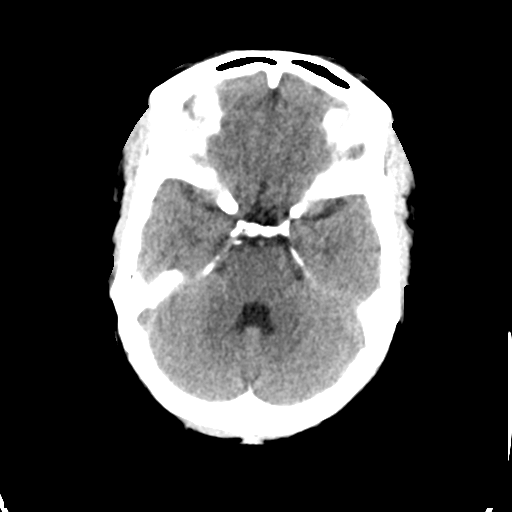
[im 12/31  brain]
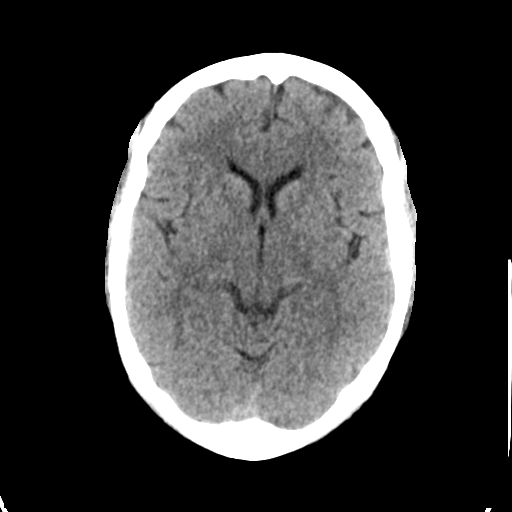
[im 16/31  brain]
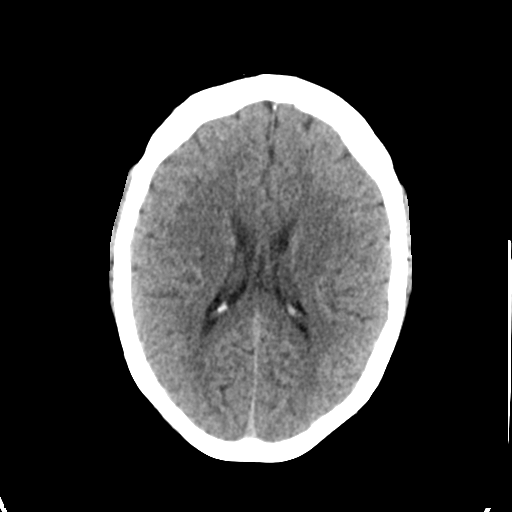
[im 19/31  brain]
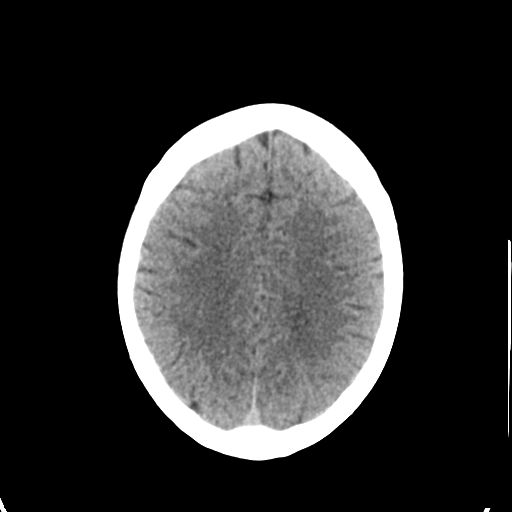
[im 19/31  bone]
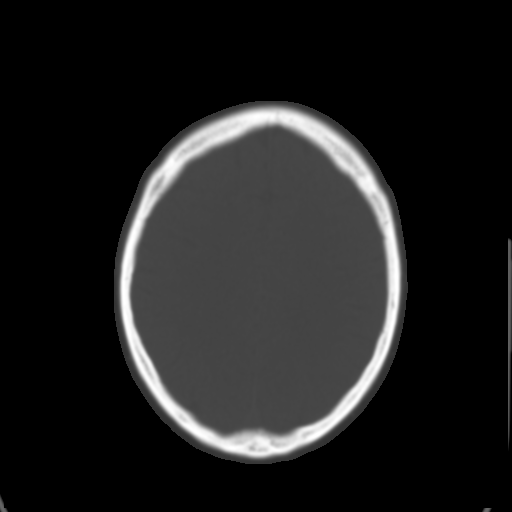
[im 23/31  brain]
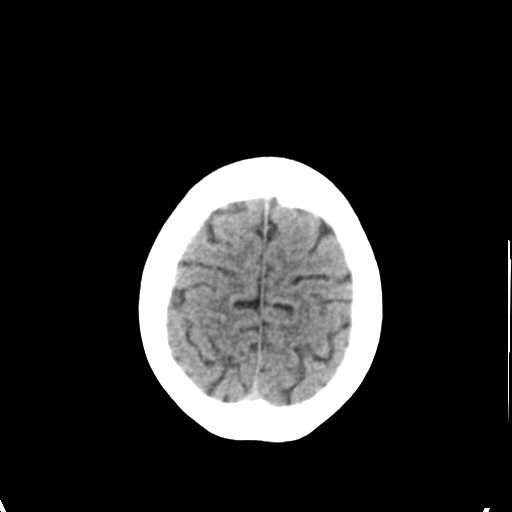
[im 27/31  brain]
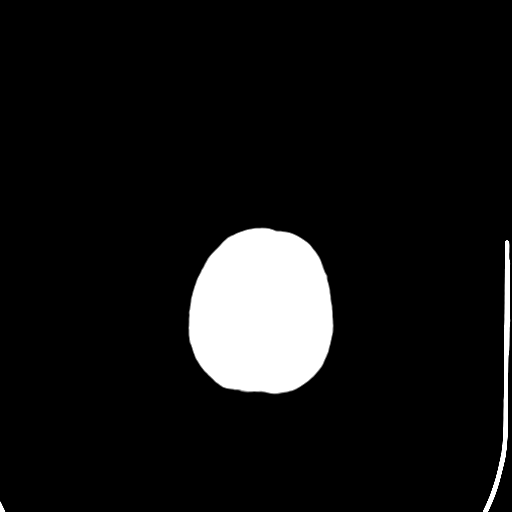

[Series 4: head bone · axial · 0.42mm/px · z∈[-104,-74]mm · 3 of 76 slices shown]
[im 8/76  bone]
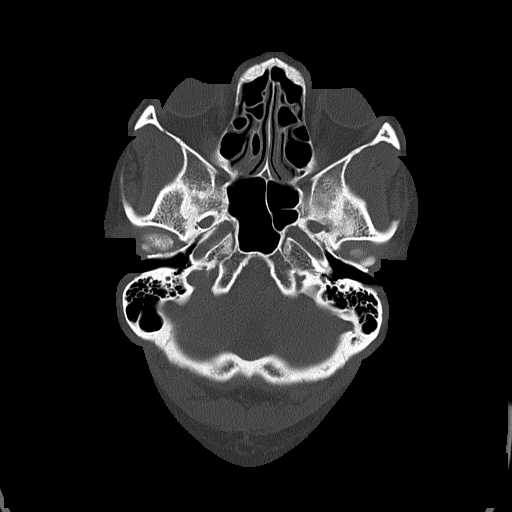
[im 16/76  bone]
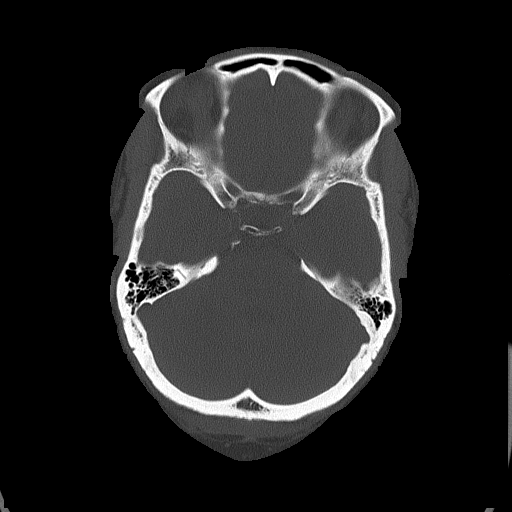
[im 23/76  bone]
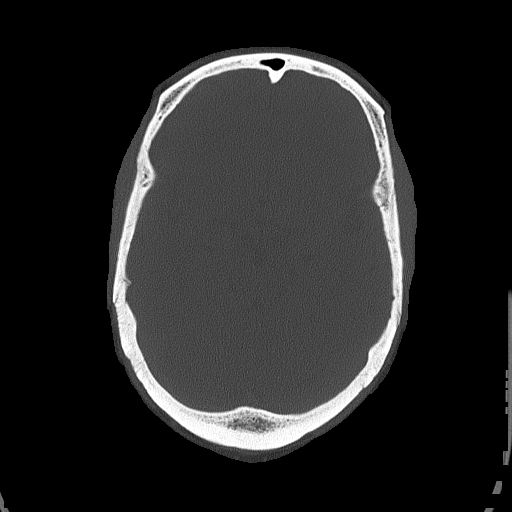

[Series 5: head without cor · coronal · non-contrast · 0.29mm/px · 3 of 58 slices shown]
[im 20/58  brain]
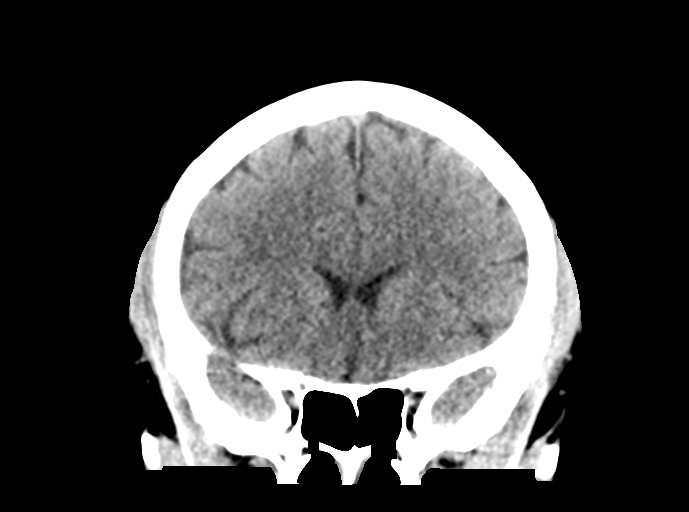
[im 26/58  brain]
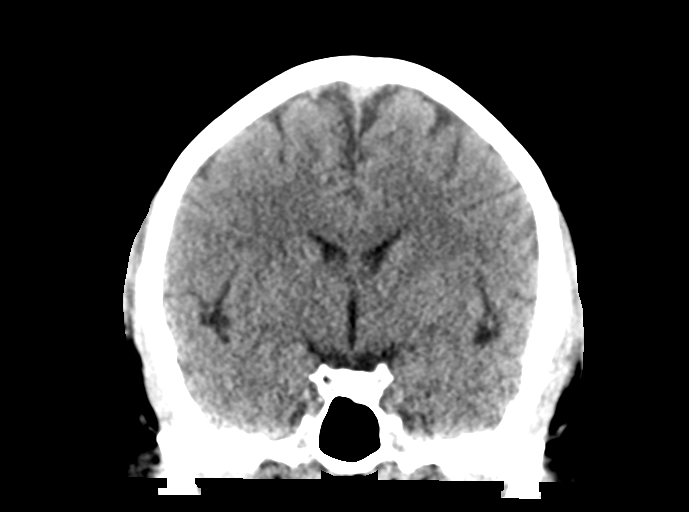
[im 32/58  brain]
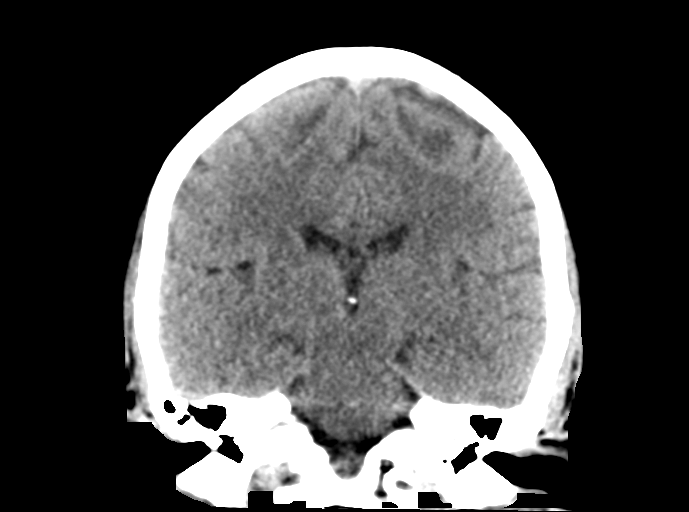

[Series 6: head without sag · sagittal · non-contrast · 0.31mm/px · 3 of 45 slices shown]
[im 15/45  brain]
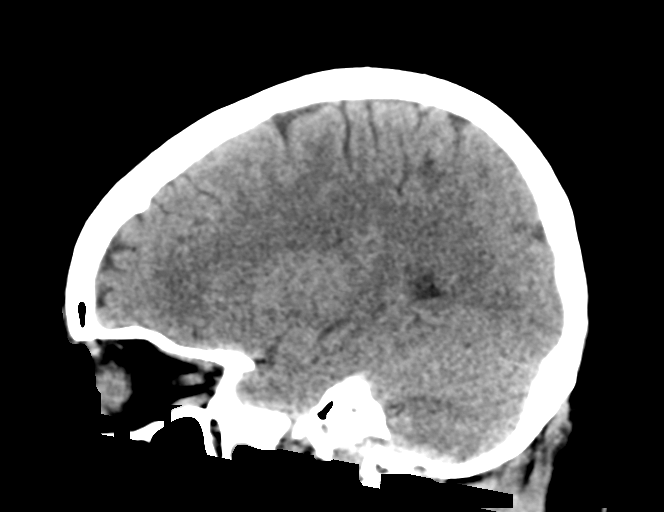
[im 23/45  brain]
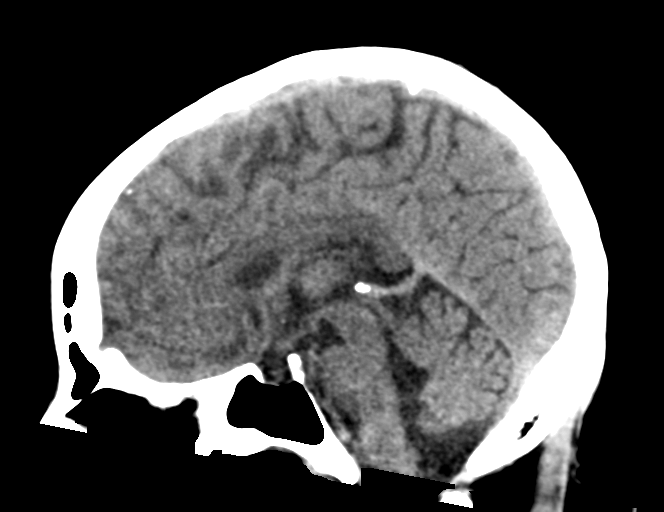
[im 30/45  brain]
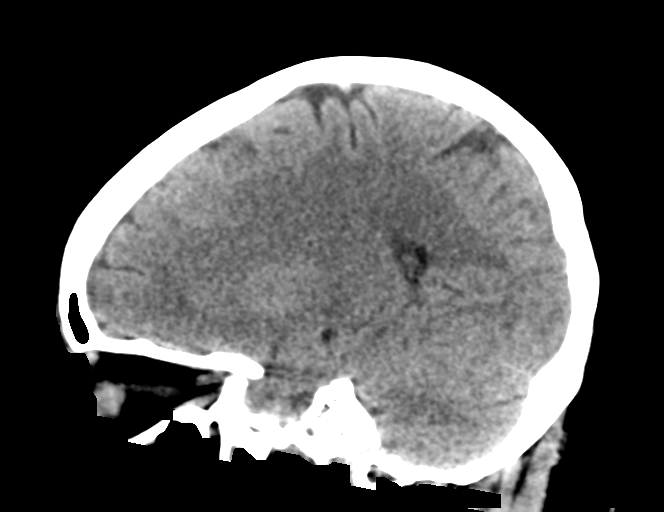

[16 of 47 positions shown; findings below may reference images not displayed]

FINDINGS: BRAIN: The ventricles and sulci are normal. No intraparenchymal
hemorrhage, mass effect nor midline shift. No acute large vascular
territory infarcts. Grey-white matter distinction is maintained. The
basal ganglia are unremarkable. No abnormal extra-axial fluid
collections. Basal cisterns are not effaced and midline. The
brainstem and cerebellar hemispheres are without acute
abnormalities.

VASCULAR: No hyperdense vessel sign or unexpected calcifications.

SKULL/SOFT TISSUES: No skull fracture. No significant soft tissue
swelling.

ORBITS/SINUSES: The included ocular globes and orbital contents are
normal.The mastoid air cells are clear. The included paranasal
sinuses are well-aerated.

OTHER: None.
IMPRESSION: Normal head CT

## 2018-01-27 IMAGING — CR DG CHEST 2V
2 series · 2 of 2 positions shown · non-contrast
Comparison: None.

CLINICAL DATA: Patient was found on floor unresponsive and passed
out.

EXAM:
CHEST - 2 VIEW

[chest lat]
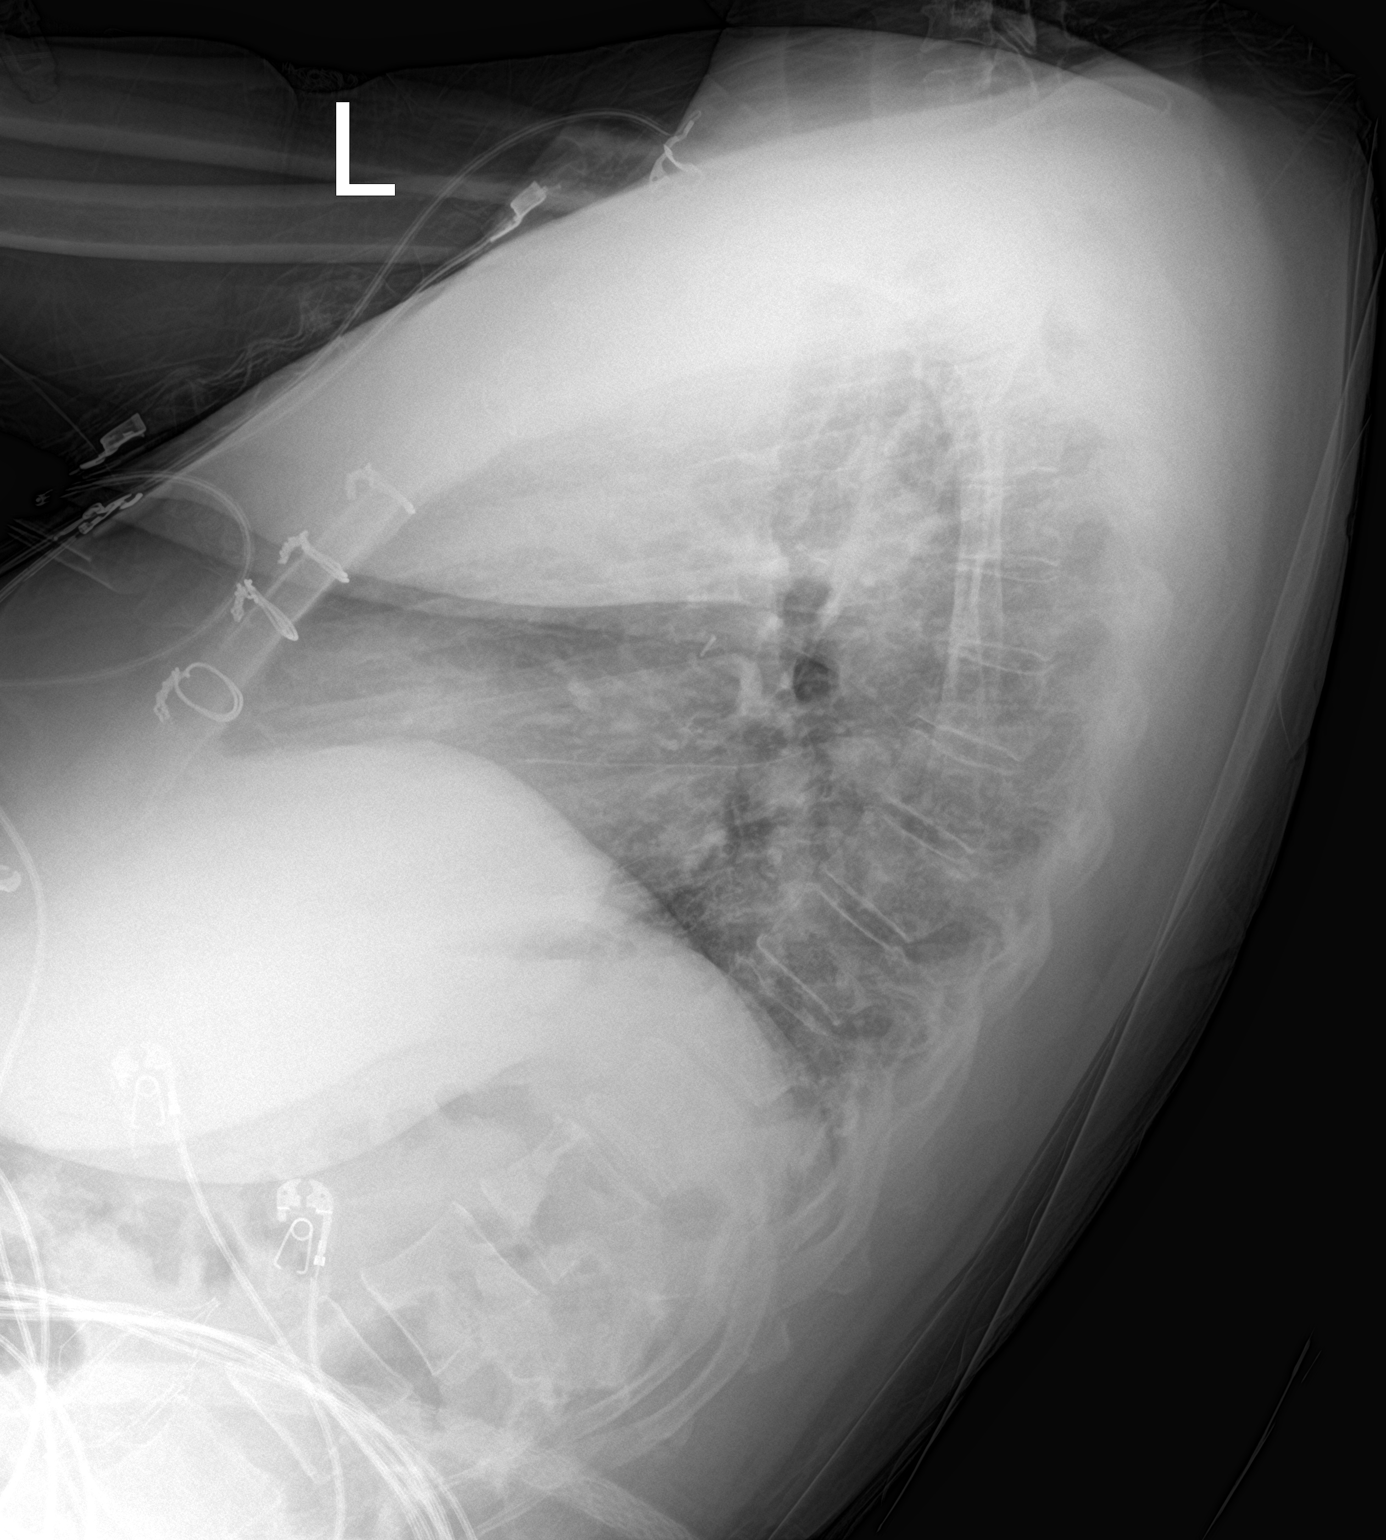

[chest ap]
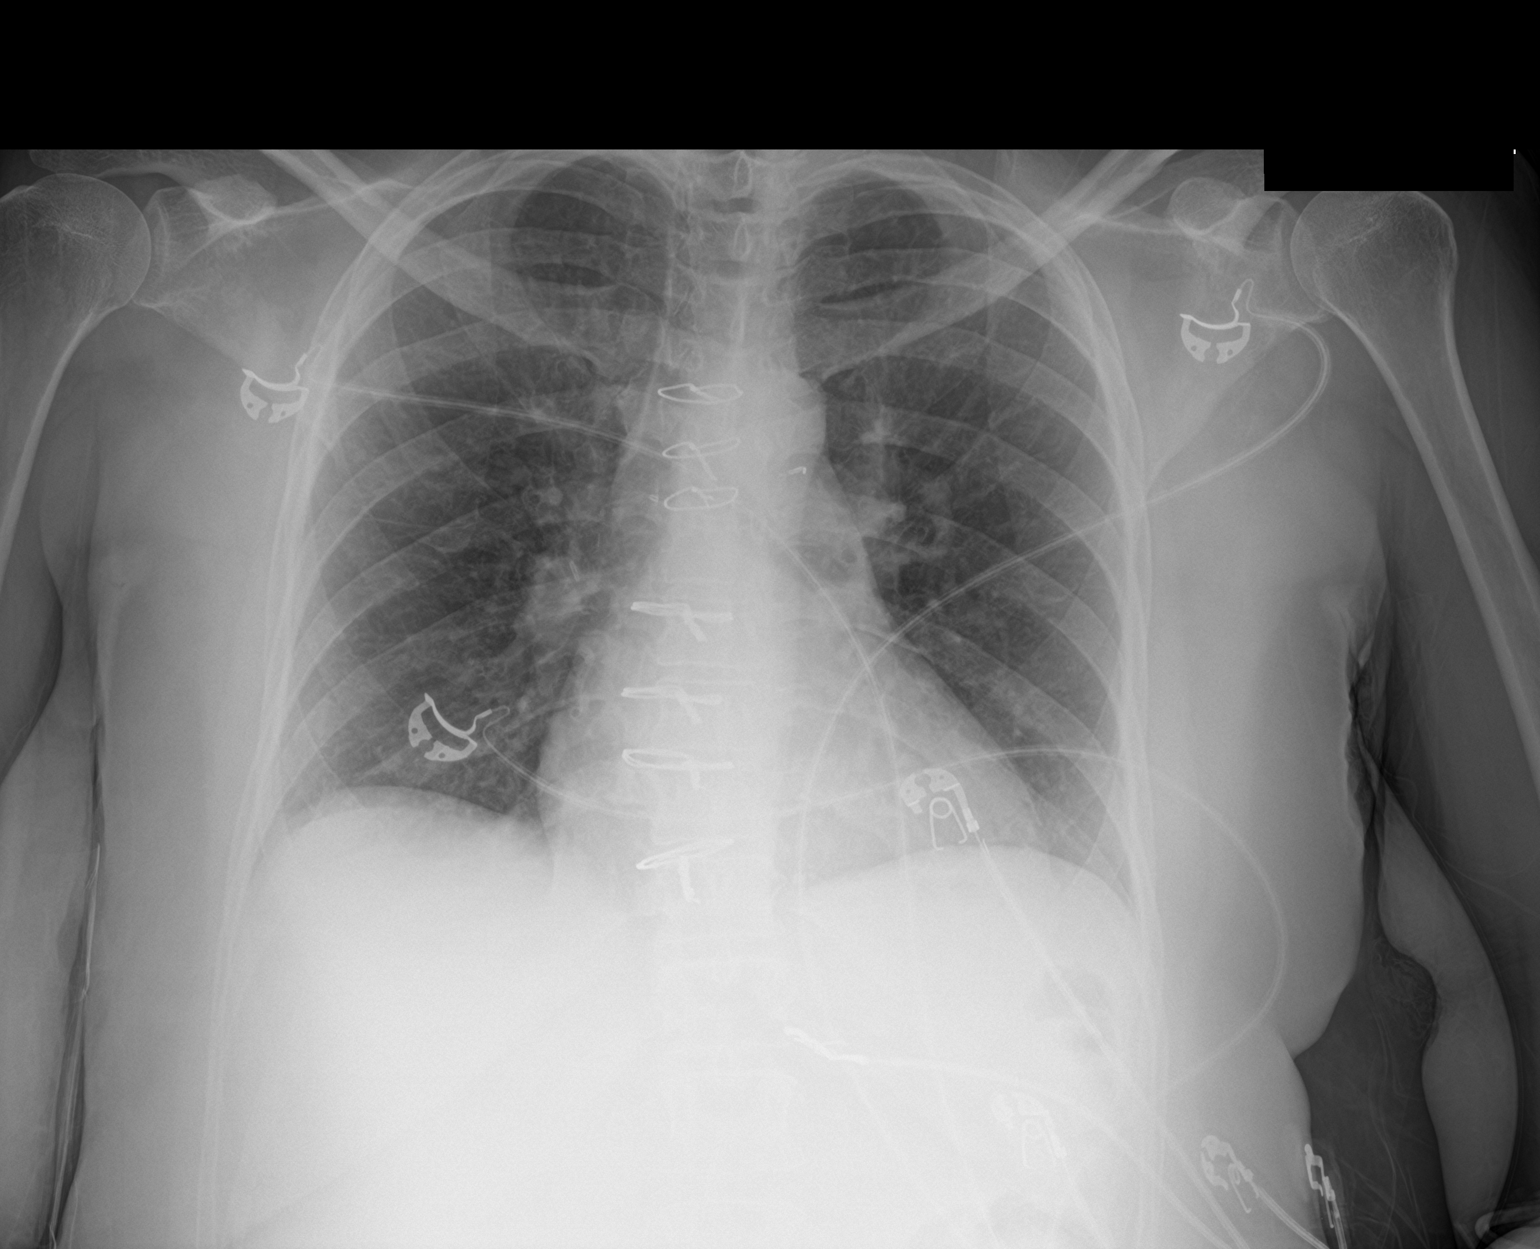

[2 of 2 positions shown; findings below may reference images not displayed]

FINDINGS: Median sternotomy sutures are in place. Heart is top-normal in size.
No aortic aneurysm is identified. No acute pulmonary consolidation,
effusion or pneumothorax. No acute osseous abnormality.
IMPRESSION: No active cardiopulmonary disease.

## 2018-01-27 MED ORDER — SODIUM CHLORIDE 0.9 % IV BOLUS
1000.0000 mL | Freq: Once | INTRAVENOUS | Status: AC
  Administered 2018-01-27: 1000 mL via INTRAVENOUS

## 2018-01-27 MED ORDER — LORAZEPAM 2 MG/ML IJ SOLN
1.0000 mg | Freq: Once | INTRAMUSCULAR | Status: AC
  Administered 2018-01-27: 1 mg via INTRAVENOUS
  Filled 2018-01-27: qty 1

## 2018-01-27 NOTE — ED Notes (Signed)
Pt to CT at this time.

## 2018-01-27 NOTE — ED Triage Notes (Signed)
Pt was upstairs on 4W with a family member who had a stroke. Staff states the pt was laying on the floor for about an hour before they finally discovered she was unresponsive and passed out.

## 2018-01-27 NOTE — ED Provider Notes (Signed)
[facility] EMERGENCY DEPARTMENT Provider Note   CSN: [id number] Arrival date & time: 01/27/18  2111  History   Chief Complaint Chief Complaint  Patient presents with  . Unresponsive    HPI [name] is a 47 y.o. female.  The history is provided by the patient (hospital staff).   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 Patient's adult daughter is currently hospitalized for a stroke, patient was found lying on the floor, unresponsive. Was last seen 1 hr before. En route to ED, patient became more responsive, tearful. States she learned something new today in regards to her daughter's care, but will not discuss details. Endorses chest tightness, dyspnea, paresthesias to distal extremities. States she has had these symptoms before after hearing bad news. Denies headache, vision changes. Denies drug use. Has been compliant with her anticoagulation. No witnessed seizure activity, no h/o seizures.   Past Medical History:  Diagnosis Date  . Pulmonary embolism (HCC)     There are no active problems to display for this patient.   History reviewed. No pertinent surgical history.   OB History   None      Home Medications    Prior to Admission medications   Medication Sig Start Date End Date Taking? Authorizing Provider  acetaminophen (TYLENOL) 500 MG tablet Take 500 mg by mouth every 6 (six) hours as needed for headache (pain).   Yes [provider]  albuterol (PROVENTIL HFA;VENTOLIN HFA) 108 (90 Base) MCG/ACT inhaler Inhale 2 puffs into the lungs every 6 (six) hours as needed for wheezing or shortness of breath.   Yes [provider]  fluticasone (FLONASE) 50 MCG/ACT nasal spray Place 1 spray into both nostrils daily as needed for allergies or rhinitis.   Yes [provider]  Multiple Vitamin (MULTIVITAMIN WITH MINERALS) TABS tablet  Take 1 tablet by mouth daily. Centrum   Yes [provider]  rivaroxaban (XARELTO) 20 MG TABS tablet Take 20 mg by mouth daily with breakfast.    Yes [provider]    Family History No family history on file.  Social History Social History   Tobacco Use  . Smoking status: Not on file  Substance Use Topics  . Alcohol use: Not on file  . Drug use: Not on file     Allergies   Aspirin   Review of Systems Review of Systems  Constitutional: Negative for chills and fever.  HENT: Negative for ear pain and sore throat.   Eyes: Negative for pain and visual disturbance.  Respiratory: Positive for chest tightness and shortness of breath. Negative for cough.   Cardiovascular: Negative for chest pain, palpitations and leg swelling.  Gastrointestinal: Negative for abdominal pain and vomiting.  Genitourinary: Negative for dysuria and hematuria.  Musculoskeletal: Negative for arthralgias and back pain.  Skin: Negative for color change and rash.  Neurological: Negative for seizures and speech difficulty.       Paresthesias to extremities x 4  Psychiatric/Behavioral: The patient is nervous/anxious.   All other systems reviewed and are negative.    Physical Exam Updated Vital Signs BP 120/84   Pulse 75   Resp 19   Ht 5' 5"  (1.651 m)   Wt 72.6 kg (160 lb)   SpO2 100%   BMI 26.63 kg/m   Physical Exam  Constitutional: She is oriented to person, place, and time. She appears well-developed and  well-nourished. No distress.  HENT:  Head: Normocephalic and atraumatic.  Mouth/Throat: Oropharynx is clear and moist.  Eyes: Pupils are equal, round, and reactive to light. Conjunctivae and EOM are normal.  Neck: Normal range of motion. Neck supple.  Cardiovascular: Normal rate, regular rhythm, normal heart sounds and intact distal pulses.  No murmur heard. Pulmonary/Chest: Effort normal and breath sounds normal. No stridor. Tachypnea noted. No respiratory distress.    Abdominal: Soft. There is no tenderness.  Musculoskeletal: She exhibits no edema.  Neurological: She is alert and oriented to person, place, and time. No cranial nerve deficit or sensory deficit.  CN II-XII intact, intact strength and sensation throughout  Skin: Skin is warm and dry.  Psychiatric: Her mood appears anxious.  Tearful  Nursing note and vitals reviewed.    ED Treatments / Results  Labs (all labs ordered are listed, but only abnormal results are displayed) Labs Reviewed  BASIC METABOLIC PANEL - Abnormal; Notable for the following components:      Result Value   Glucose, Bld 119 (*)    Creatinine, Ser 1.12 (*)    GFR calc non Af Amer 57 (*)    All other components within normal limits  CBC  CBG MONITORING, ED  I-STAT TROPONIN, ED  I-STAT BETA HCG BLOOD, ED ([facility], [facility], [facility] ONLY)    EKG None  Radiology Dg Chest 2 View  Result Date: 01/27/2018 CLINICAL DATA:  Patient was found on floor unresponsive and passed out. EXAM: CHEST - 2 VIEW COMPARISON:  None. FINDINGS: Median sternotomy sutures are in place. Heart is top-normal in size. No aortic aneurysm is identified. No acute pulmonary consolidation, effusion or pneumothorax. No acute osseous abnormality. IMPRESSION: No active cardiopulmonary disease. Electronically Signed   By: Ashley Royalty M.D.   On: 01/27/2018 23:43   Ct Head Wo Contrast  Result Date: 01/27/2018 CLINICAL DATA:  Headache. Patient found on floor unresponsive and passed out. EXAM: CT HEAD WITHOUT CONTRAST TECHNIQUE: Contiguous axial images were obtained from the base of the skull through the vertex without intravenous contrast. COMPARISON:  None. FINDINGS: BRAIN: The ventricles and sulci are normal. No intraparenchymal hemorrhage, mass effect nor midline shift. No acute large vascular territory infarcts. Grey-white matter distinction is maintained. The basal ganglia are unremarkable. No abnormal extra-axial fluid collections. Basal cisterns are not effaced and  midline. The brainstem and cerebellar hemispheres are without acute abnormalities. VASCULAR: No hyperdense vessel sign or unexpected calcifications. SKULL/SOFT TISSUES: No skull fracture. No significant soft tissue swelling. ORBITS/SINUSES: The included ocular globes and orbital contents are normal.The mastoid air cells are clear. The included paranasal sinuses are well-aerated. OTHER: None. IMPRESSION: Normal head CT Electronically Signed   By: Ashley Royalty M.D.   On: 01/27/2018 23:21    Procedures Procedures (including critical care time)  Medications Ordered in ED Medications  LORazepam (ATIVAN) injection 1 mg (1 mg Intravenous Given 01/27/18 2203)  sodium chloride 0.9 % bolus 1,000 mL (0 mLs Intravenous Stopped 01/27/18 2323)     Initial Impression / Assessment and Plan / ED Course  I have reviewed the triage vital signs and the nursing notes.  Pertinent labs & imaging results that were available during my care of the patient were reviewed by me and considered in my medical decision making (see chart for details).     BRE PECINA is a 47 y.o. 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 Reviewed and confirmed nursing documentation for past medical history, family history, social history. VS RR 30, HDS, not tachycardic. BP wnl. Exam remarkable for nonfocal exam, tachypneic, tearful. Favor panic attack with hyperventilation, pt states she heard bad news in regards to her daughter, states she had episodes similar to this in the past. Doubt PE with normal VS, anticoagulated. Doubt ACS. Question sz, but no witnessed activity, no tongue biting or incontinence.  Given IV dose ativan, IVF bolus with resolution of symptoms. CT head wnl. EKG wnl. CXR neg. CBC, beta, troponin all negative. BMP with Cr 1.12, no prior for comparison. Tolerating PO  with no difficulty.   Old records reviewed. Labs reviewed by me and used in the medical decision making.  Imaging viewed and interpreted by me and used in the medical decision making (formal interpretation from radiologist). EKG reviewed by me and used in the medical decision making. D/c home in stable condition, return precautions discussed. Patient agreeable with plan for d/c home.   Final Clinical Impressions(s) / ED Diagnoses   Final diagnoses:  Panic attack    ED Discharge Orders    None       Norm Salt, MD 01/28/18 2831    Jola Schmidt, MD 01/28/18 [id number]

## 2018-01-28 ENCOUNTER — Encounter (HOSPITAL_COMMUNITY): Payer: Self-pay | Admitting: Emergency Medicine

## 2018-01-28 LAB — I-STAT TROPONIN, ED: Troponin i, poc: 0.01 ng/mL (ref 0.00–0.08)

## 2018-01-28 NOTE — ED Notes (Signed)
Istat troponin not done on original draw, being run at this time by Santiago Glad in [place].

## 2018-01-30 ENCOUNTER — Encounter: Payer: Self-pay | Admitting: Obstetrics & Gynecology

## 2018-04-14 ENCOUNTER — Other Ambulatory Visit: Payer: Self-pay | Admitting: Internal Medicine

## 2018-04-14 DIAGNOSIS — Z1231 Encounter for screening mammogram for malignant neoplasm of breast: Secondary | ICD-10-CM

## 2018-04-21 ENCOUNTER — Other Ambulatory Visit: Payer: Self-pay | Admitting: Pulmonary Disease

## 2018-05-26 ENCOUNTER — Ambulatory Visit
Admission: RE | Admit: 2018-05-26 | Discharge: 2018-05-26 | Disposition: A | Discharge status: Home / Self Care | Payer: BLUE CROSS/BLUE SHIELD | Source: Ambulatory Visit | Attending: Internal Medicine | Admitting: Internal Medicine

## 2018-05-26 DIAGNOSIS — Z1231 Encounter for screening mammogram for malignant neoplasm of breast: Secondary | ICD-10-CM

## 2018-05-26 LAB — HM MAMMOGRAPHY

## 2018-05-26 IMAGING — MG DIGITAL SCREENING BILATERAL MAMMOGRAM WITH CAD
4 series · 4 of 4 positions shown · non-contrast
Comparison: Previous exam(s).

CLINICAL DATA: Screening.

EXAM:
DIGITAL SCREENING BILATERAL MAMMOGRAM WITH CAD

[R MLO]
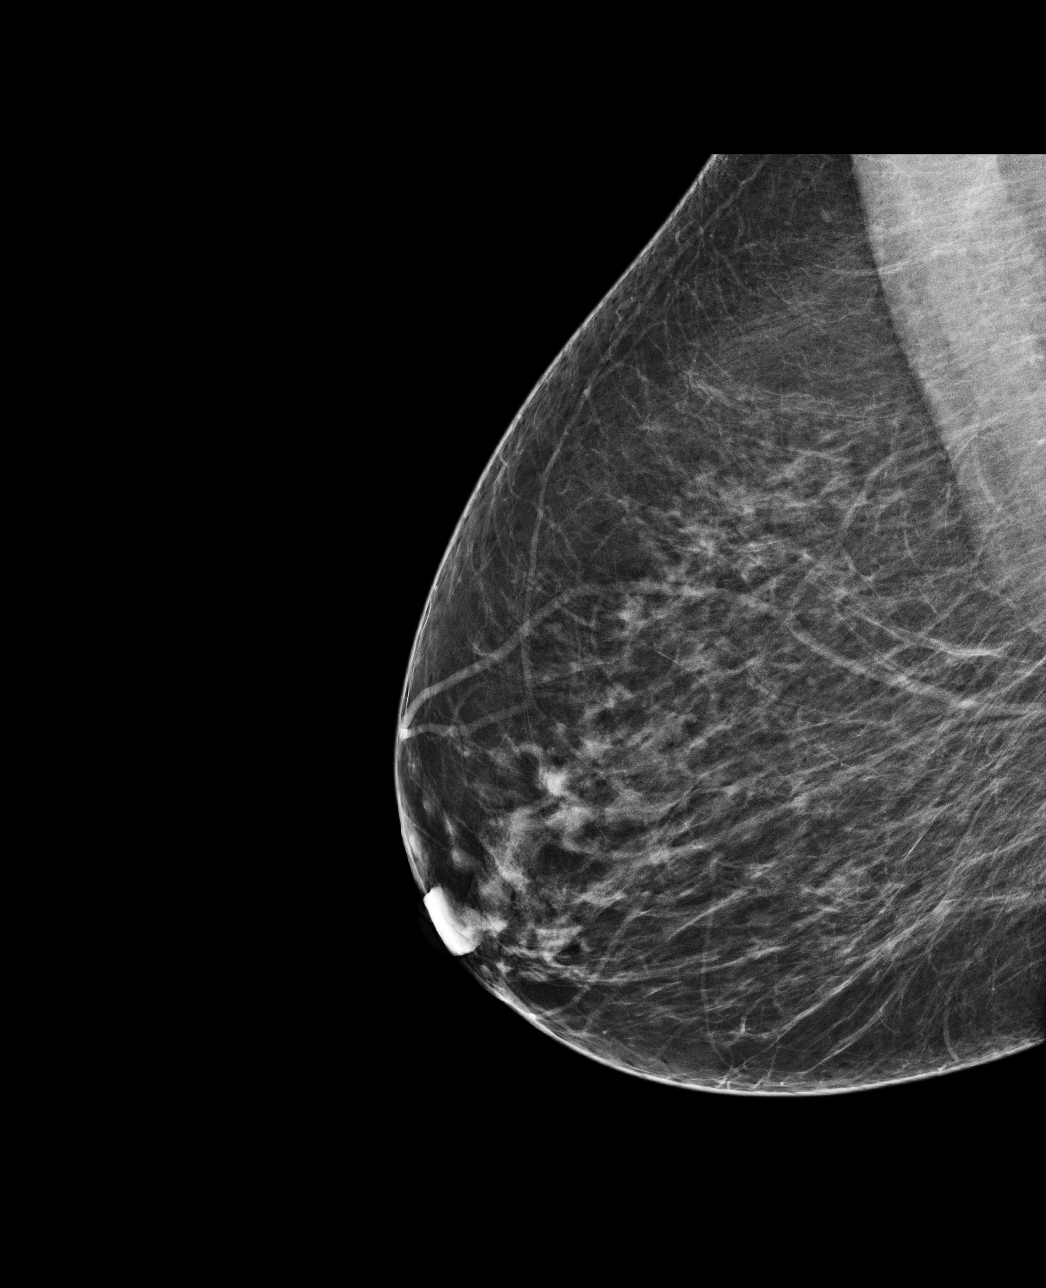

[L CC]
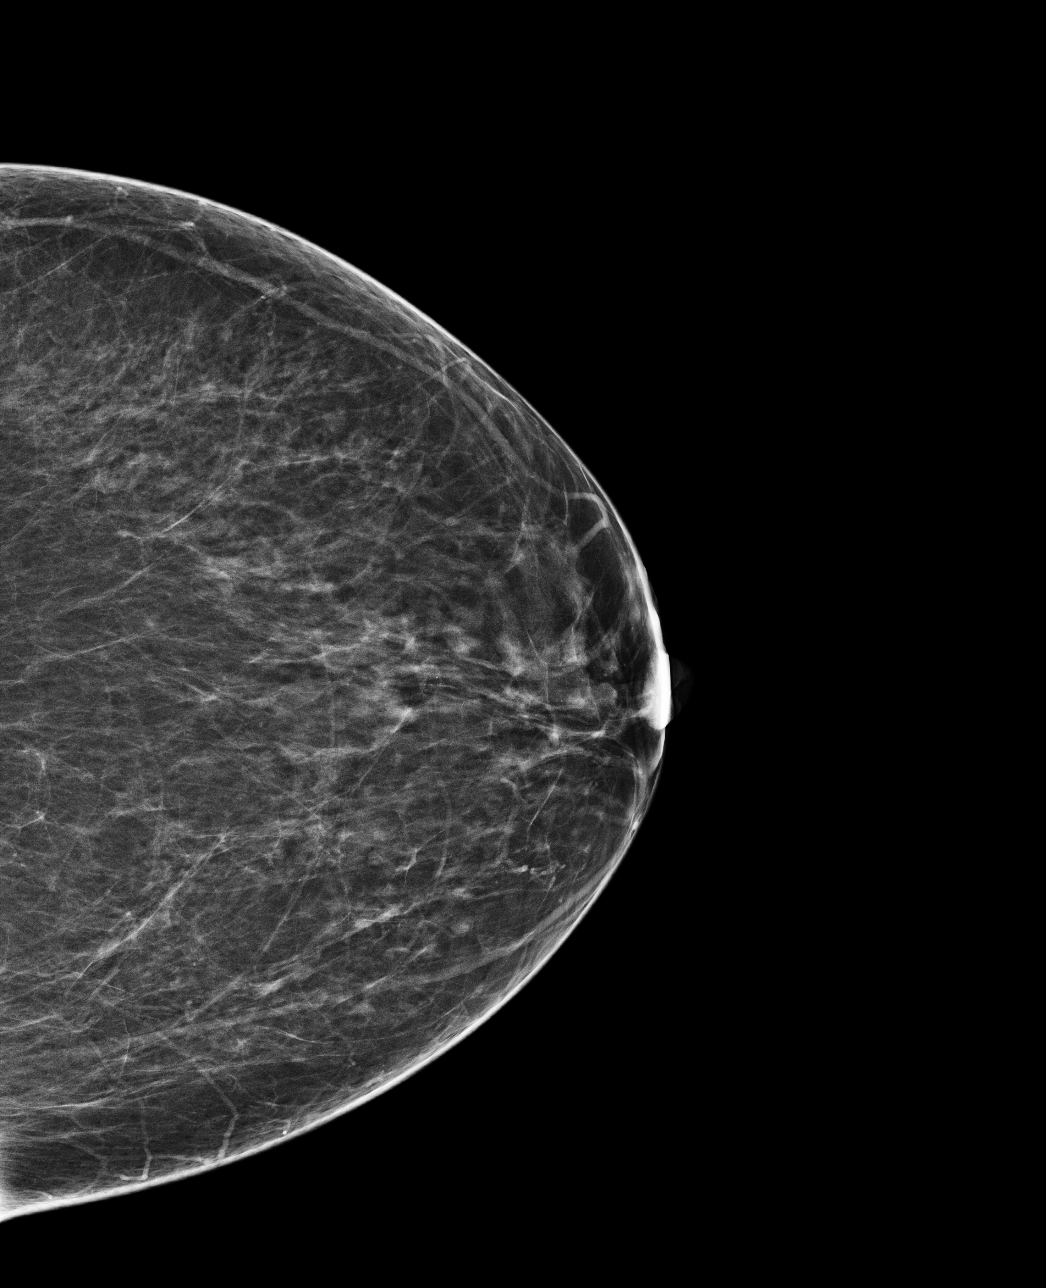

[R CC]
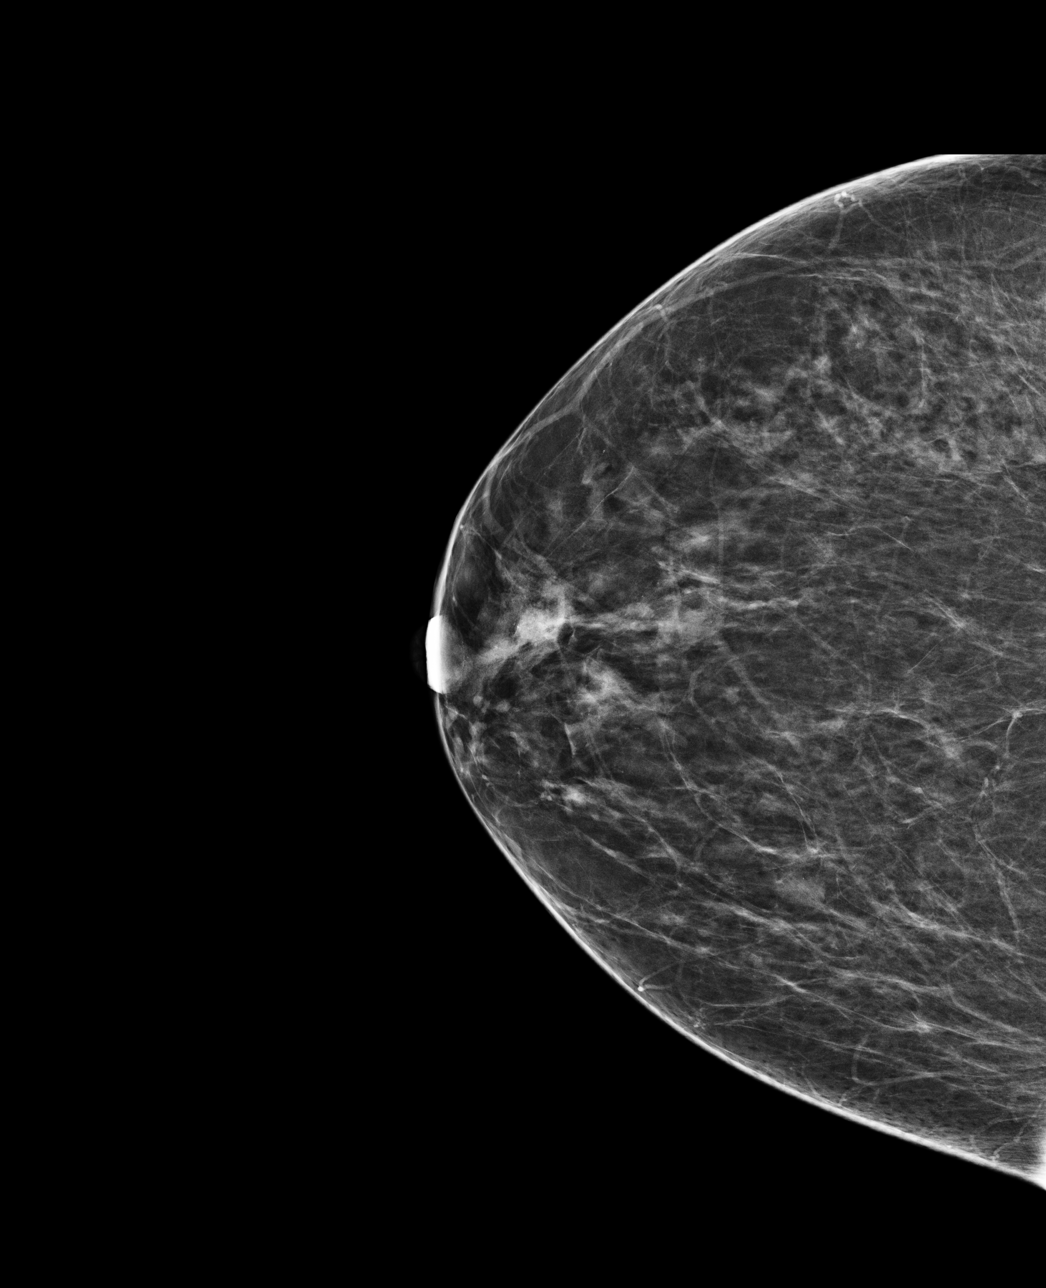

[L MLO]
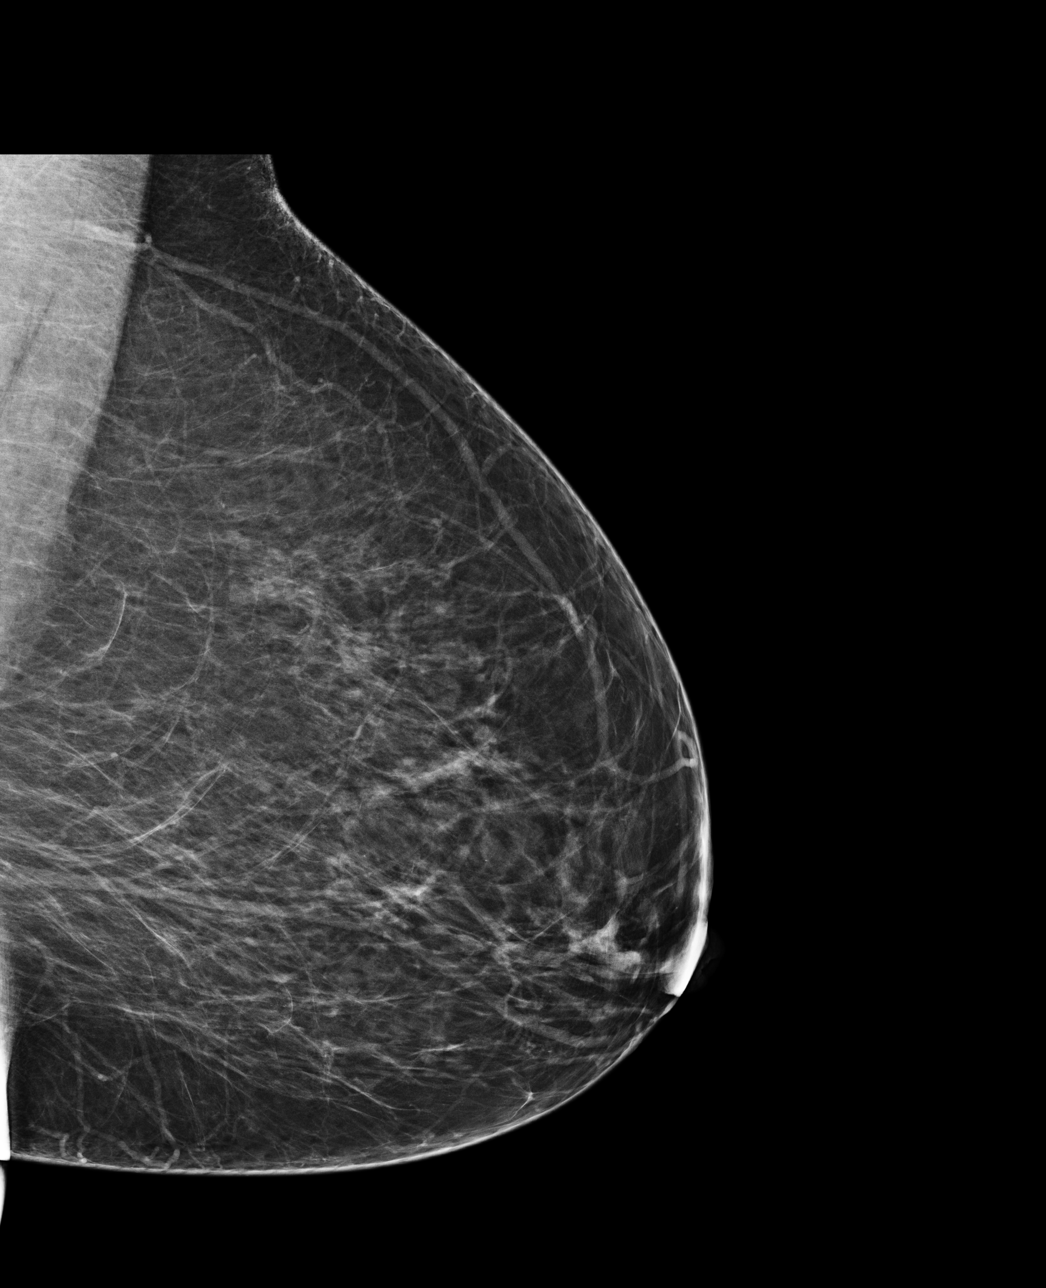

[4 of 4 positions shown; findings below may reference images not displayed]

ACR Breast Density Category b: There are scattered areas of
fibroglandular density.
FINDINGS: In the right breast, a possible asymmetry warrants further
evaluation. In the left breast, no findings suspicious for
malignancy. Images were processed with CAD.
IMPRESSION: Further evaluation is suggested for possible asymmetry in the right
breast.

RECOMMENDATION:
Diagnostic mammogram and possibly ultrasound of the right breast.
(Code:[1O])

The patient will be contacted regarding the findings, and additional
imaging will be scheduled.

BI-RADS CATEGORY  0: Incomplete. Need additional imaging evaluation
and/or prior mammograms for comparison.

## 2018-05-29 ENCOUNTER — Other Ambulatory Visit: Payer: Self-pay | Admitting: Internal Medicine

## 2018-05-29 DIAGNOSIS — R928 Other abnormal and inconclusive findings on diagnostic imaging of breast: Secondary | ICD-10-CM

## 2018-06-02 ENCOUNTER — Ambulatory Visit
Admission: RE | Admit: 2018-06-02 | Discharge: 2018-06-02 | Disposition: A | Discharge status: Home / Self Care | Payer: BLUE CROSS/BLUE SHIELD | Source: Ambulatory Visit | Attending: Internal Medicine | Admitting: Internal Medicine

## 2018-06-02 ENCOUNTER — Ambulatory Visit: Payer: Self-pay

## 2018-06-02 DIAGNOSIS — R928 Other abnormal and inconclusive findings on diagnostic imaging of breast: Secondary | ICD-10-CM

## 2018-06-02 LAB — HM MAMMOGRAPHY

## 2018-06-02 IMAGING — MG DIGITAL DIAGNOSTIC UNILATERAL RIGHT MAMMOGRAM WITH TOMO AND CAD
4 series · 4 of 12 positions shown · non-contrast
Comparison: Previous exam(s).

CLINICAL DATA: Screening recall for a right breast asymmetry.

EXAM:
DIGITAL DIAGNOSTIC UNILATERAL RIGHT MAMMOGRAM WITH CAD AND TOMO

[R MLO synth-2D]
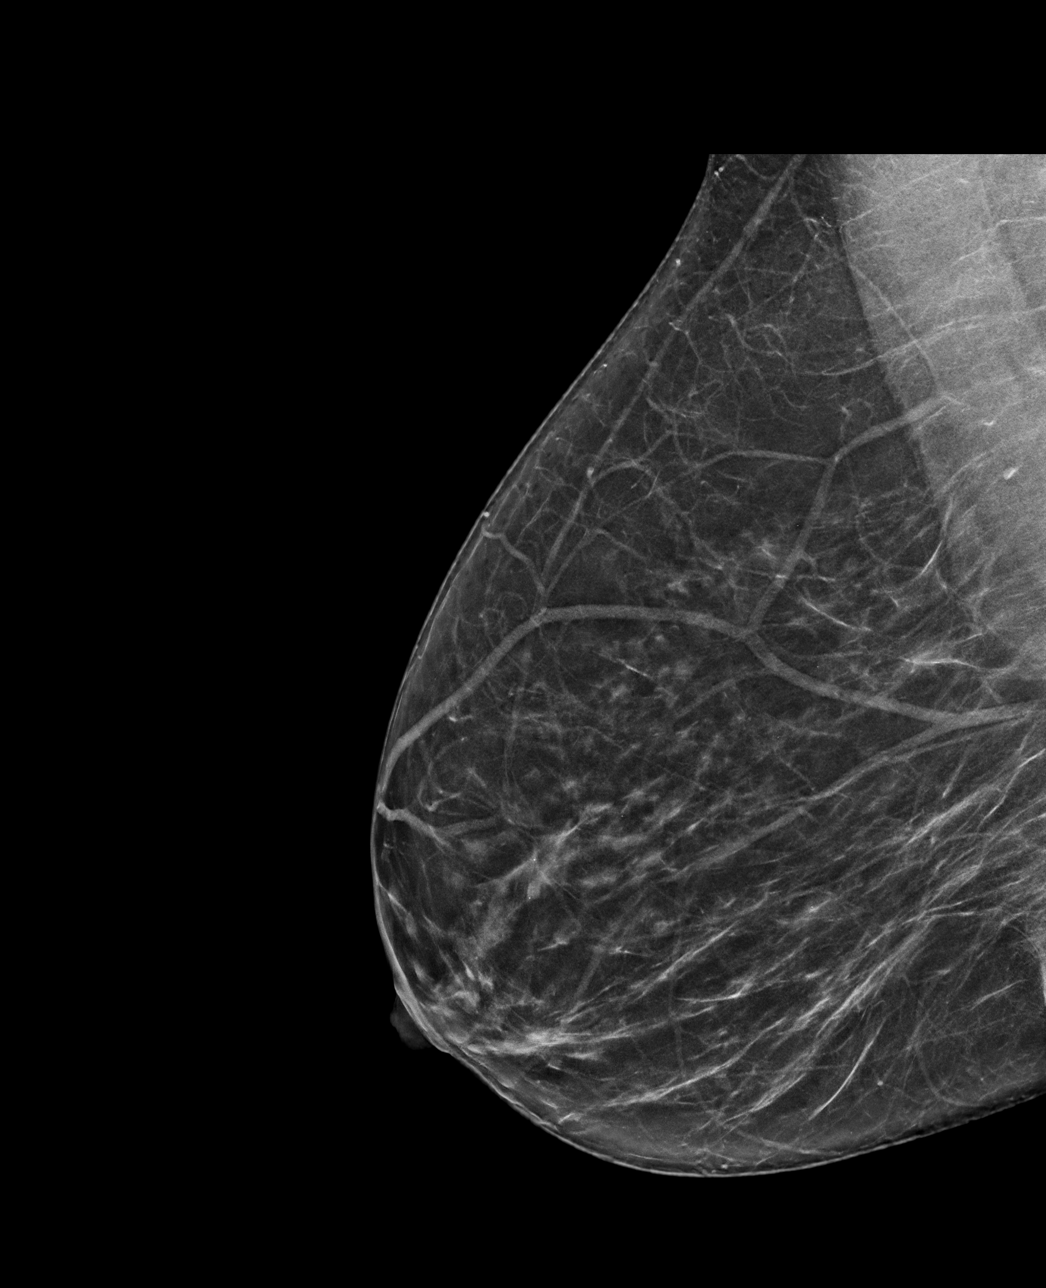

[R CC synth-2D]
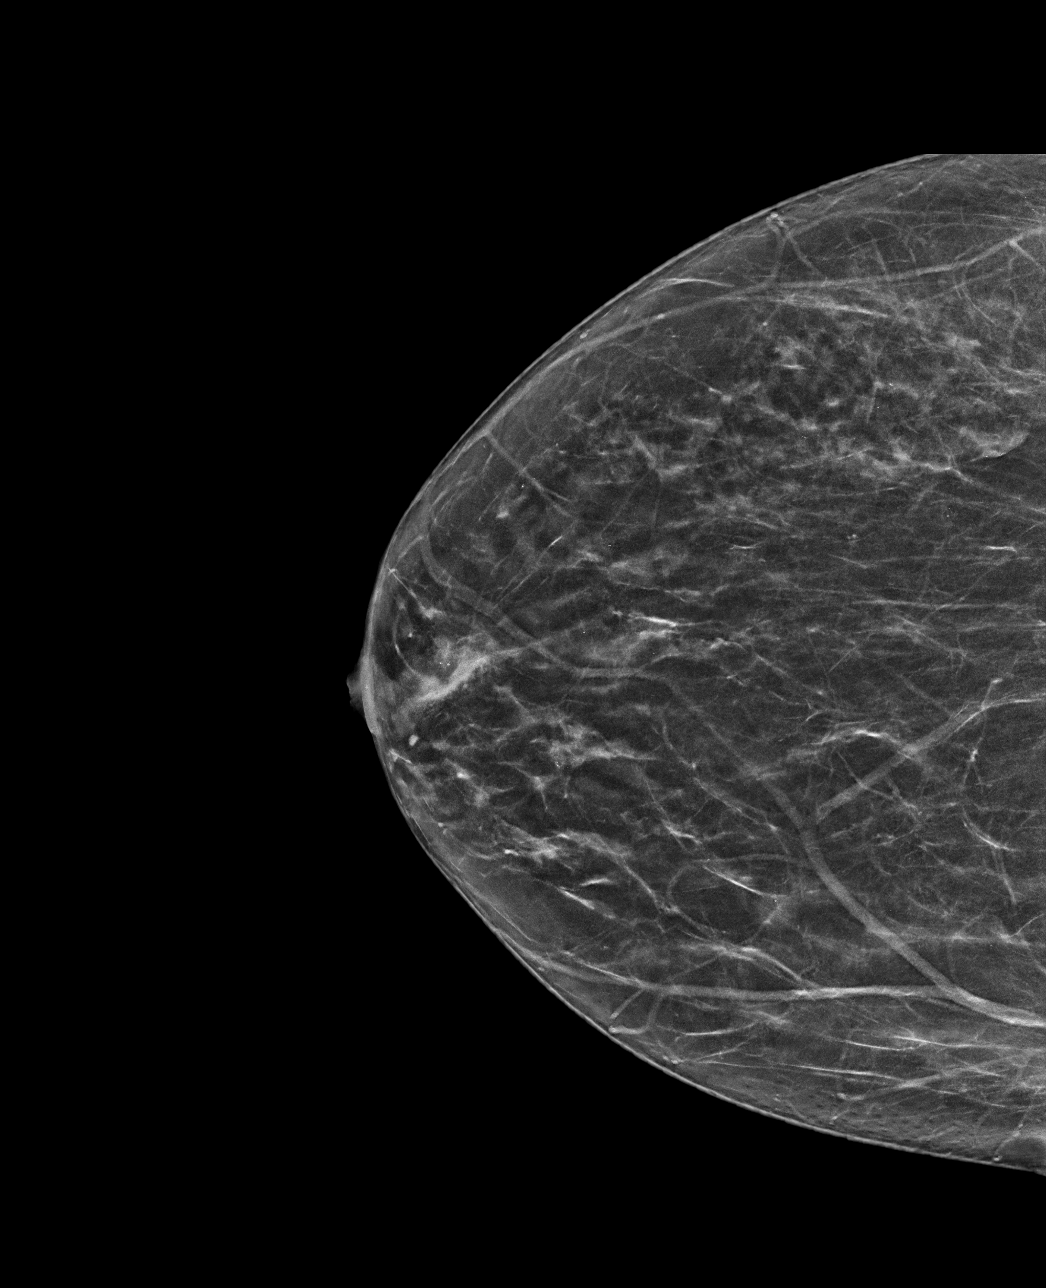

[R CC tomo · tomo slice 31/61.0]
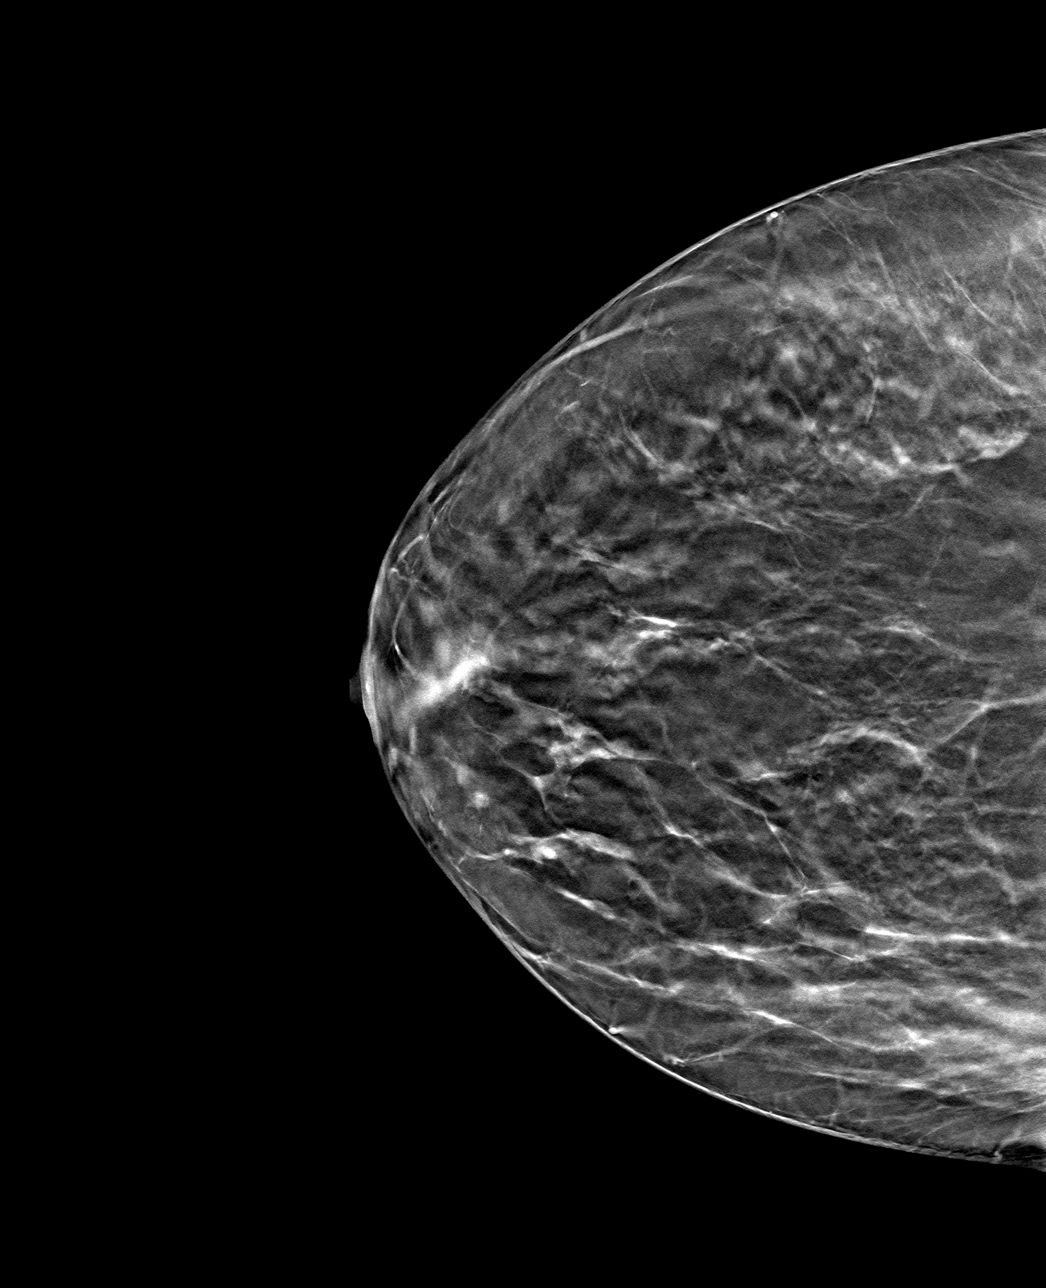

[R MLO tomo · tomo slice 37/72.0]
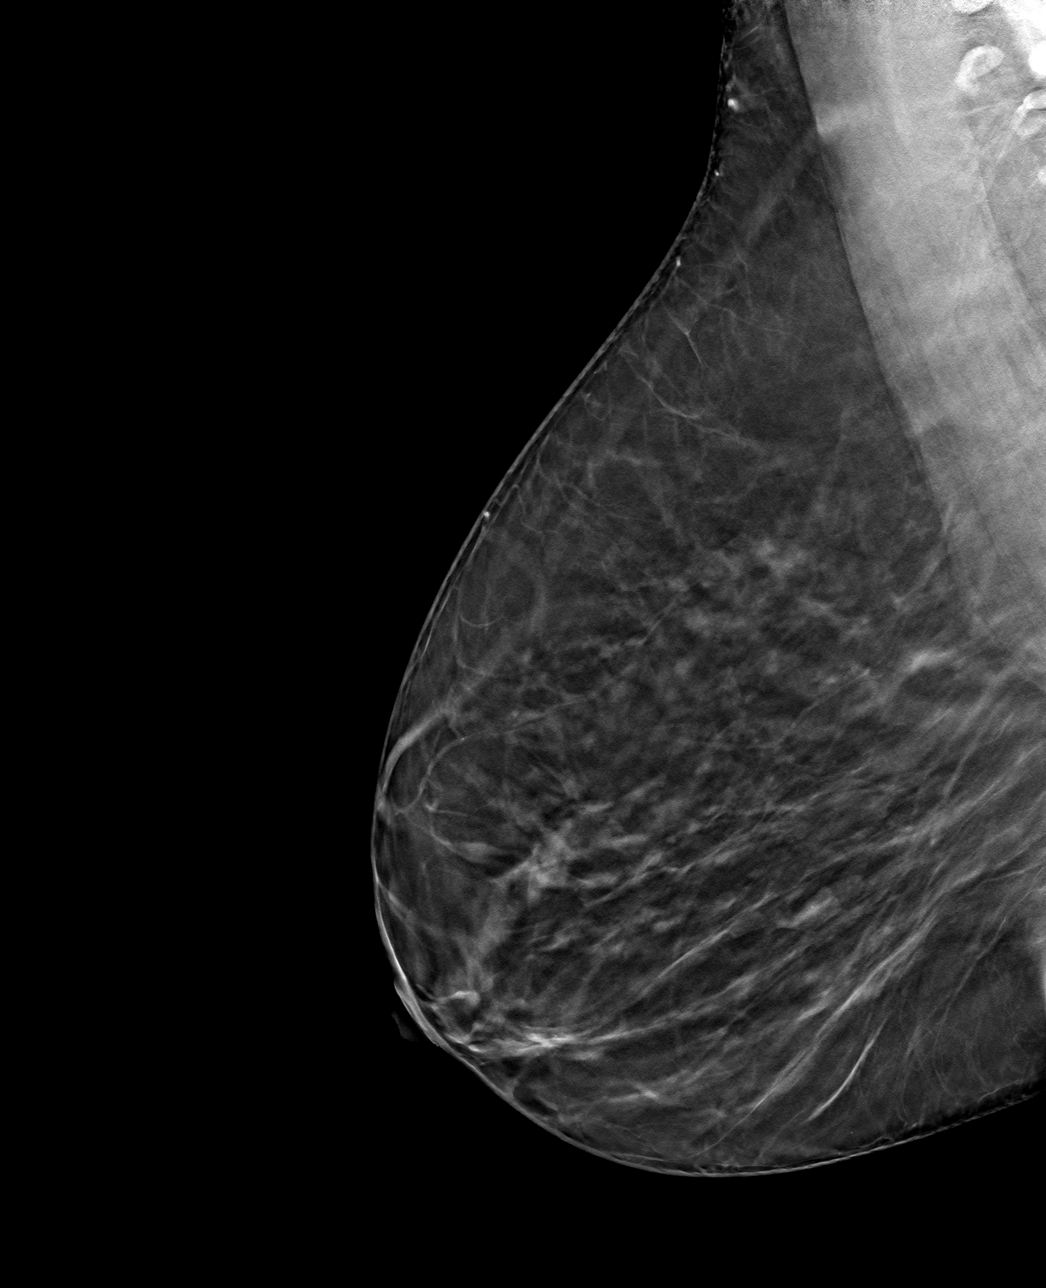

[4 of 12 positions shown; findings below may reference images not displayed]

ACR Breast Density Category b: There are scattered areas of
fibroglandular density.
FINDINGS: The asymmetry in the retroareolar right breast resolves on
tomosynthesis imaging. The breast tissue in this region appears
stable as compared to multiple prior mammograms.

Mammographic images were processed with CAD.
IMPRESSION: Resolution of the right breast asymmetry consistent with overlapping
fibroglandular tissue.

RECOMMENDATION:
Screening mammogram in one year.(Code:[U7])

I have discussed the findings and recommendations with the patient.
Results were also provided in writing at the conclusion of the
visit. If applicable, a reminder letter will be sent to the patient
regarding the next appointment.

BI-RADS CATEGORY  1: Negative.

## 2018-08-17 ENCOUNTER — Ambulatory Visit (INDEPENDENT_AMBULATORY_CARE_PROVIDER_SITE_OTHER)
Admission: RE | Admit: 2018-08-17 | Discharge: 2018-08-17 | Disposition: A | Discharge status: Home / Self Care | Payer: BLUE CROSS/BLUE SHIELD | Source: Ambulatory Visit | Attending: Internal Medicine | Admitting: Internal Medicine

## 2018-08-17 ENCOUNTER — Ambulatory Visit (INDEPENDENT_AMBULATORY_CARE_PROVIDER_SITE_OTHER): Payer: BLUE CROSS/BLUE SHIELD | Admitting: Internal Medicine

## 2018-08-17 ENCOUNTER — Other Ambulatory Visit (INDEPENDENT_AMBULATORY_CARE_PROVIDER_SITE_OTHER): Payer: BLUE CROSS/BLUE SHIELD

## 2018-08-17 ENCOUNTER — Encounter: Payer: Self-pay | Admitting: Internal Medicine

## 2018-08-17 VITALS — BP 122/80 | HR 84 | Temp 98.2°F | Resp 16 | Ht 65.0 in | Wt 183.8 lb

## 2018-08-17 DIAGNOSIS — J4520 Mild intermittent asthma, uncomplicated: Secondary | ICD-10-CM

## 2018-08-17 DIAGNOSIS — R05 Cough: Secondary | ICD-10-CM

## 2018-08-17 DIAGNOSIS — R7303 Prediabetes: Secondary | ICD-10-CM

## 2018-08-17 DIAGNOSIS — Z0000 Encounter for general adult medical examination without abnormal findings: Secondary | ICD-10-CM

## 2018-08-17 DIAGNOSIS — B181 Chronic viral hepatitis B without delta-agent: Secondary | ICD-10-CM

## 2018-08-17 DIAGNOSIS — R059 Cough, unspecified: Secondary | ICD-10-CM

## 2018-08-17 DIAGNOSIS — M545 Low back pain, unspecified: Secondary | ICD-10-CM | POA: Insufficient documentation

## 2018-08-17 DIAGNOSIS — D6861 Antiphospholipid syndrome: Secondary | ICD-10-CM

## 2018-08-17 DIAGNOSIS — D508 Other iron deficiency anemias: Secondary | ICD-10-CM | POA: Diagnosis not present

## 2018-08-17 DIAGNOSIS — Z1211 Encounter for screening for malignant neoplasm of colon: Secondary | ICD-10-CM

## 2018-08-17 DIAGNOSIS — G8929 Other chronic pain: Secondary | ICD-10-CM

## 2018-08-17 DIAGNOSIS — Z23 Encounter for immunization: Secondary | ICD-10-CM | POA: Diagnosis not present

## 2018-08-17 LAB — CBC WITH DIFFERENTIAL/PLATELET
BASOS PCT: 1.2 % (ref 0.0–3.0)
Basophils Absolute: 0 10*3/uL (ref 0.0–0.1)
EOS PCT: 1.5 % (ref 0.0–5.0)
Eosinophils Absolute: 0.1 10*3/uL (ref 0.0–0.7)
HCT: 42.9 % (ref 36.0–46.0)
Hemoglobin: 14.6 g/dL (ref 12.0–15.0)
LYMPHS ABS: 1.3 10*3/uL (ref 0.7–4.0)
Lymphocytes Relative: 37.2 % (ref 12.0–46.0)
MCHC: 34 g/dL (ref 30.0–36.0)
MCV: 88.4 fl (ref 78.0–100.0)
MONO ABS: 0.4 10*3/uL (ref 0.1–1.0)
MONOS PCT: 12.3 % — AB (ref 3.0–12.0)
NEUTROS ABS: 1.6 10*3/uL (ref 1.4–7.7)
NEUTROS PCT: 47.8 % (ref 43.0–77.0)
Platelets: 246 10*3/uL (ref 150.0–400.0)
RBC: 4.85 Mil/uL (ref 3.87–5.11)
RDW: 13.7 % (ref 11.5–15.5)
WBC: 3.4 10*3/uL — ABNORMAL LOW (ref 4.0–10.5)

## 2018-08-17 LAB — BASIC METABOLIC PANEL
BUN: 11 mg/dL (ref 6–23)
CALCIUM: 9.5 mg/dL (ref 8.4–10.5)
CO2: 28 meq/L (ref 19–32)
CREATININE: 0.98 mg/dL (ref 0.40–1.20)
Chloride: 103 mEq/L (ref 96–112)
GFR: 63.93 mL/min (ref >60.00)
GLUCOSE: 127 mg/dL — AB (ref 70–99)
Potassium: 3.5 mEq/L (ref 3.5–5.1)
SODIUM: 138 meq/L (ref 135–145)

## 2018-08-17 LAB — LIPID PANEL
CHOLESTEROL: 184 mg/dL (ref 0–200)
HDL: 49.6 mg/dL (ref >39.00)
LDL Cholesterol: 114 mg/dL — ABNORMAL HIGH (ref 0–99)
NonHDL: 134.47
TRIGLYCERIDES: 103 mg/dL (ref 0.0–149.0)
Total CHOL/HDL Ratio: 4
VLDL: 20.6 mg/dL (ref 0.0–40.0)

## 2018-08-17 LAB — HEPATIC FUNCTION PANEL
ALBUMIN: 4 g/dL (ref 3.5–5.2)
ALT: 11 U/L (ref 0–35)
AST: 18 U/L (ref 0–37)
Alkaline Phosphatase: 58 U/L (ref 39–117)
BILIRUBIN TOTAL: 0.7 mg/dL (ref 0.2–1.2)
Bilirubin, Direct: 0.1 mg/dL (ref 0.0–0.3)
Total Protein: 7.5 g/dL (ref 6.0–8.3)

## 2018-08-17 LAB — HEMOGLOBIN A1C: HEMOGLOBIN A1C: 5.6 % (ref 4.6–6.5)

## 2018-08-17 IMAGING — DX DG CHEST 2V
2 series · 2 of 2 positions shown · non-contrast
Comparison: [DATE]

CLINICAL DATA: Cough and hypertension

EXAM:
CHEST - 2 VIEW

[chest pa]
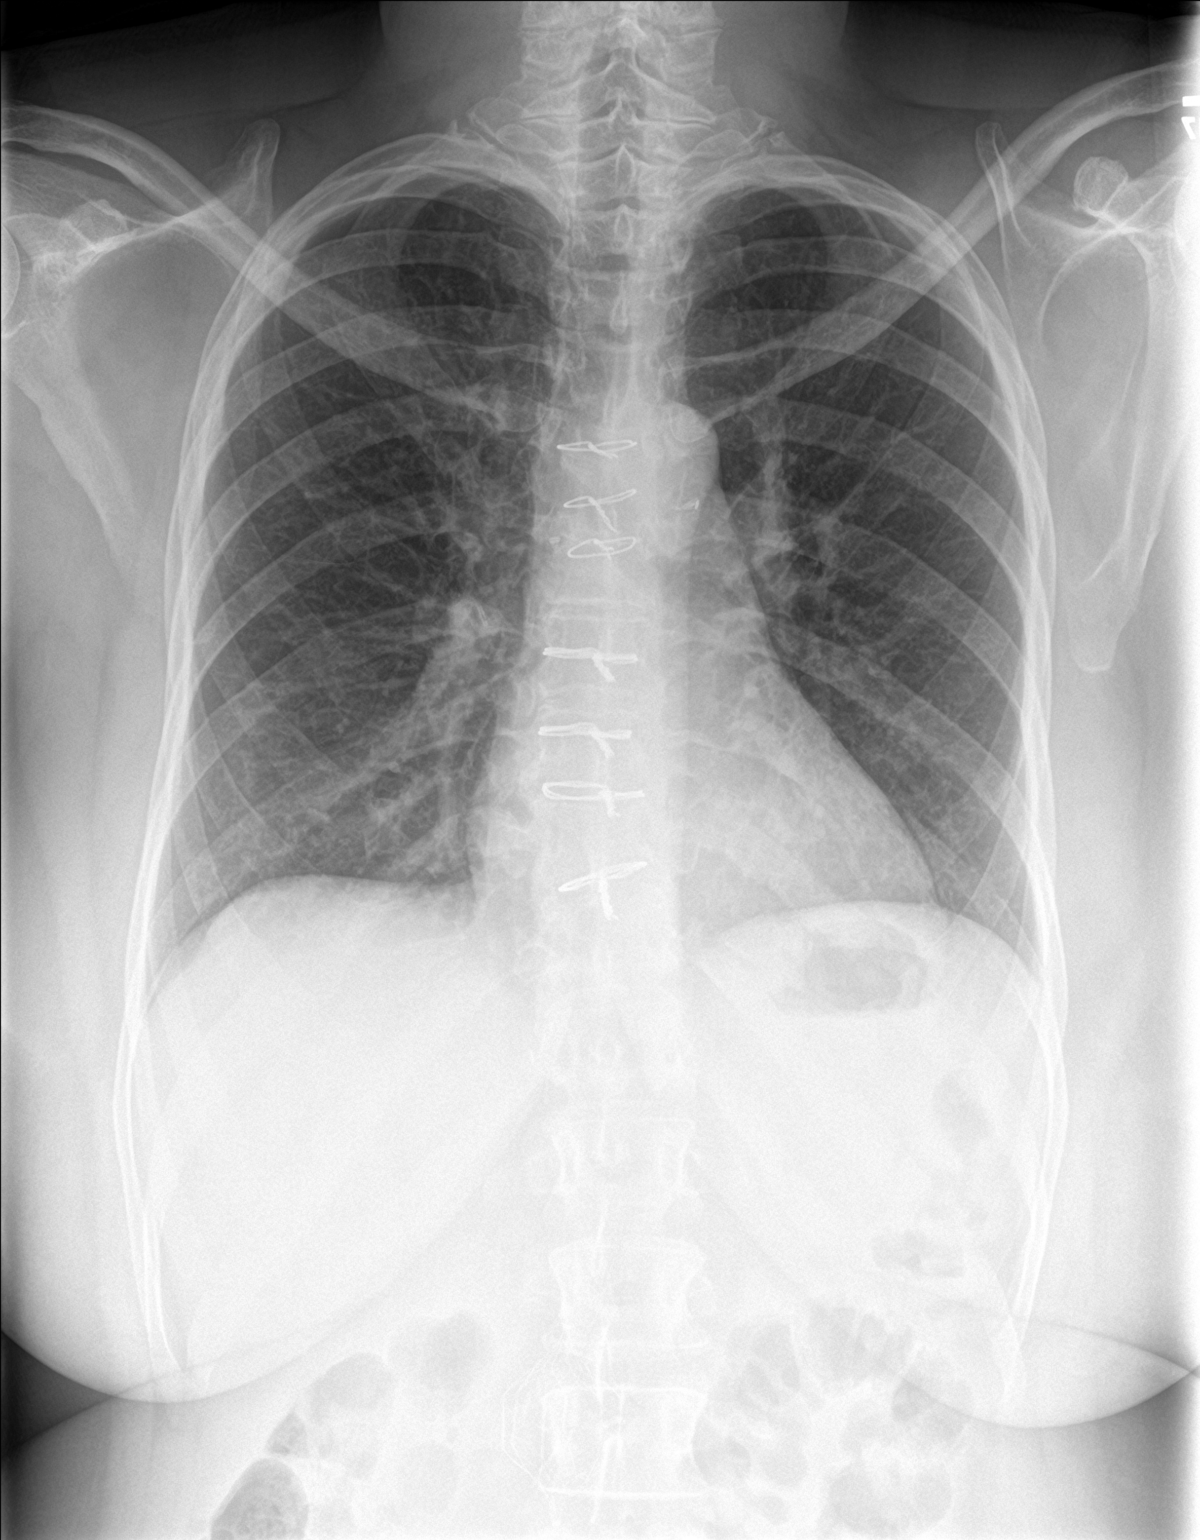

[chest lat]
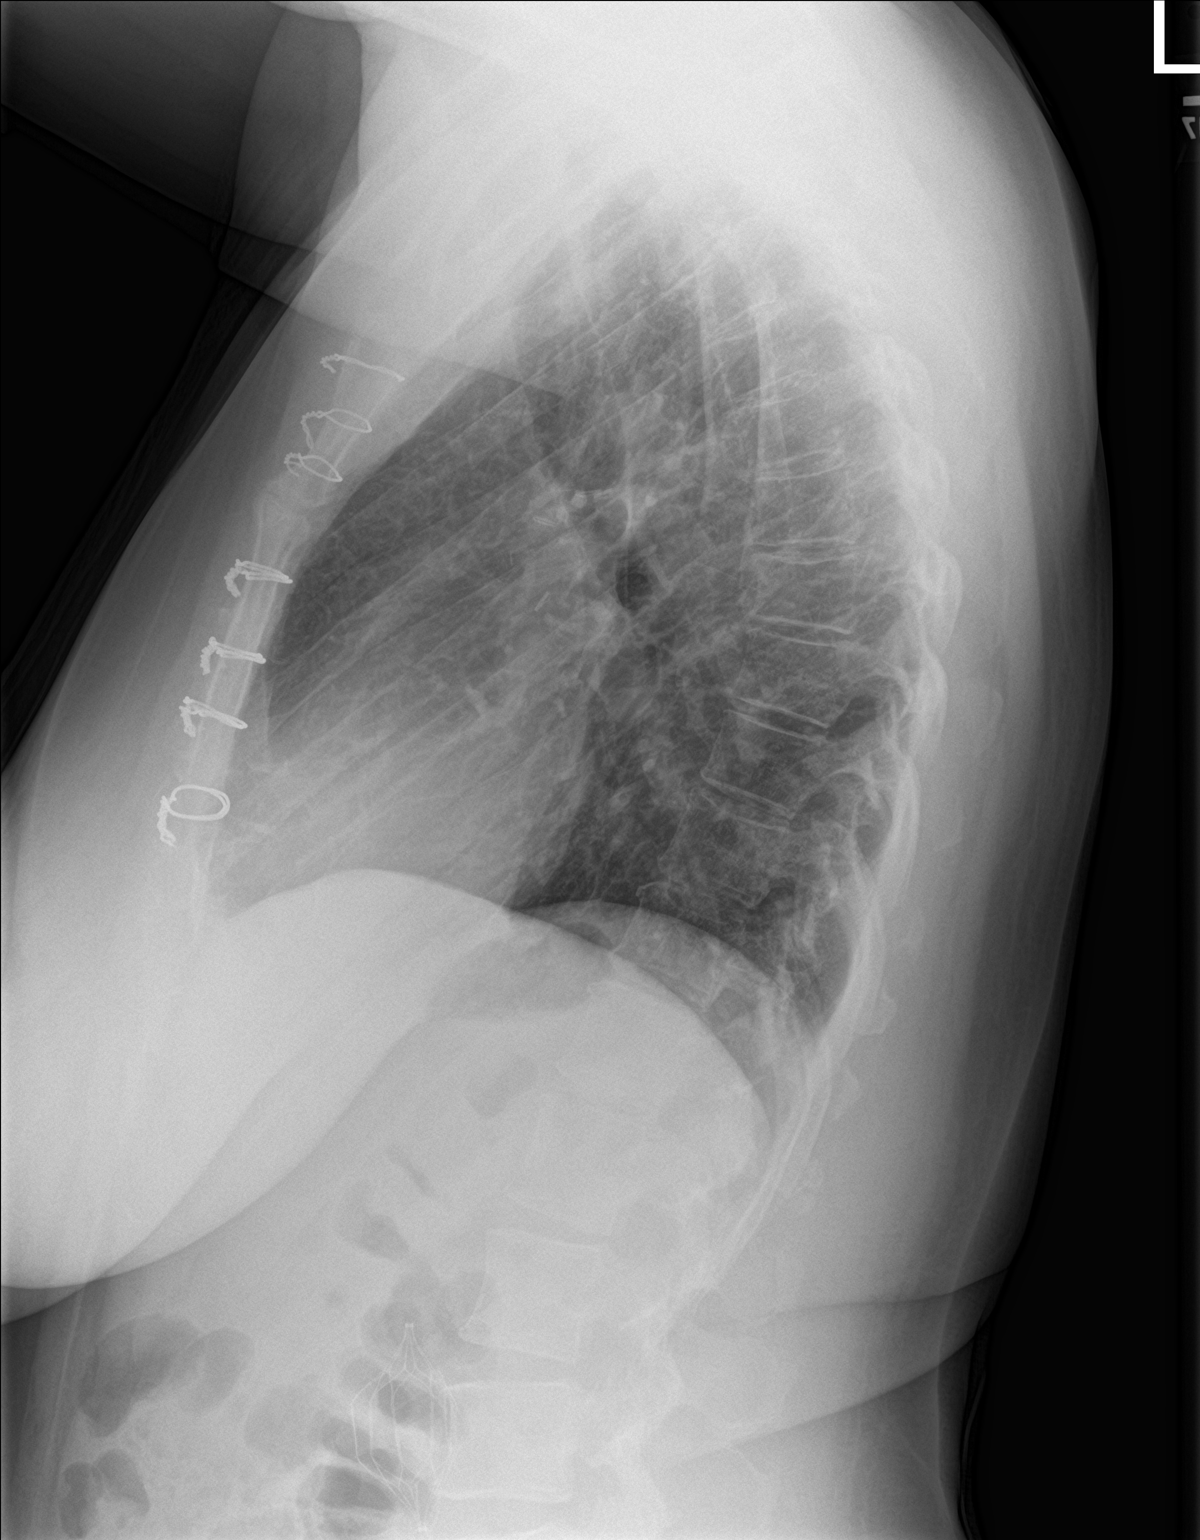

[2 of 2 positions shown; findings below may reference images not displayed]

FINDINGS: There is no edema or consolidation. The heart size and pulmonary
vascularity are normal. No adenopathy. Patient is status post median
sternotomy. There is slight anterior wedging of a midthoracic
vertebral body, stable.
IMPRESSION: No edema or consolidation.  Stable cardiac silhouette.

## 2018-08-17 IMAGING — DX DG LUMBAR SPINE COMPLETE 4+V
5 series · 5 of 5 positions shown · non-contrast
Comparison: [DATE]

CLINICAL DATA: Chronic lumbago with lower extremity radicular
symptoms

EXAM:
LUMBAR SPINE - COMPLETE 4+ VIEW

[l-spine ap]
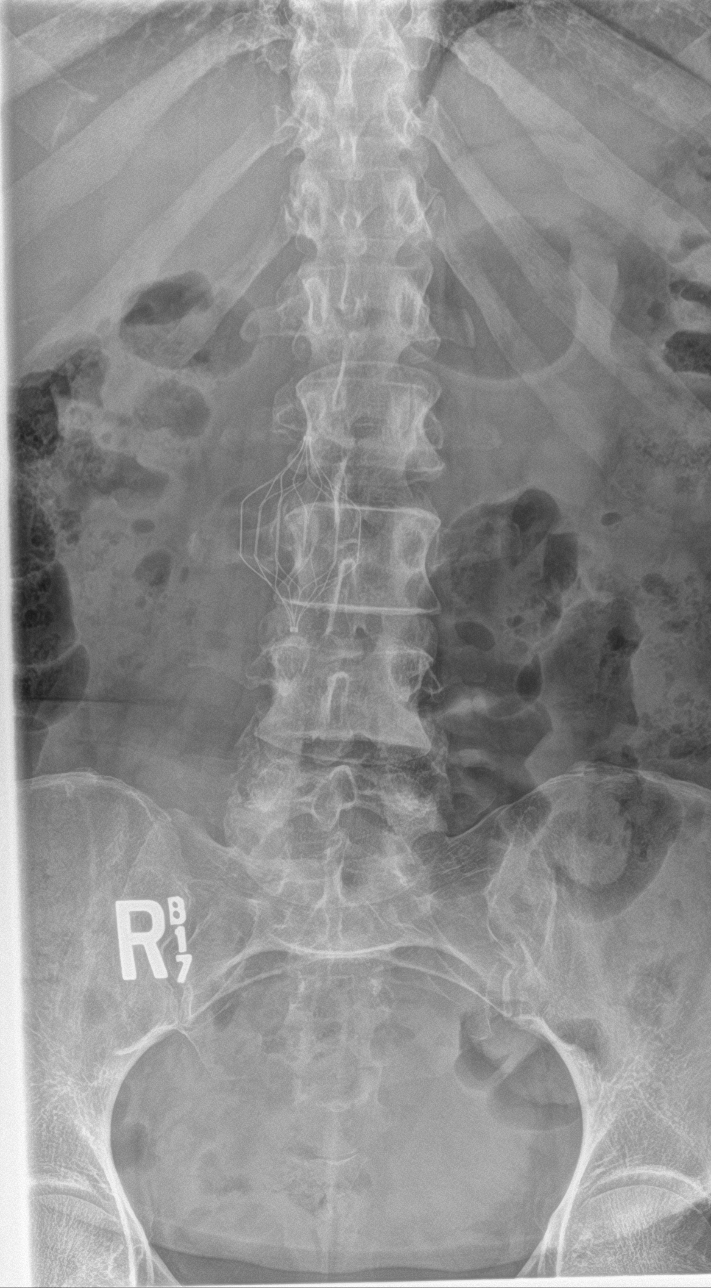

[l-spine obl (1 of 2)]
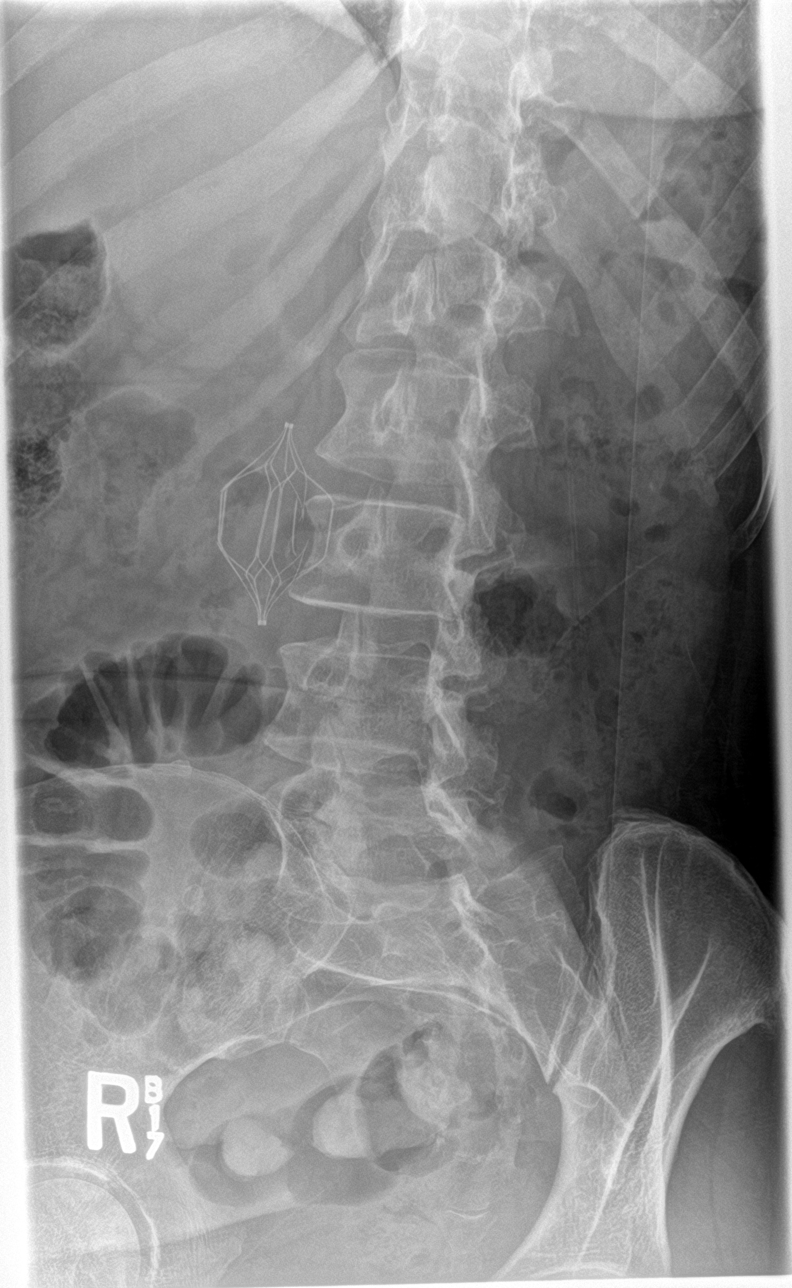

[l-spine obl (2 of 2)]
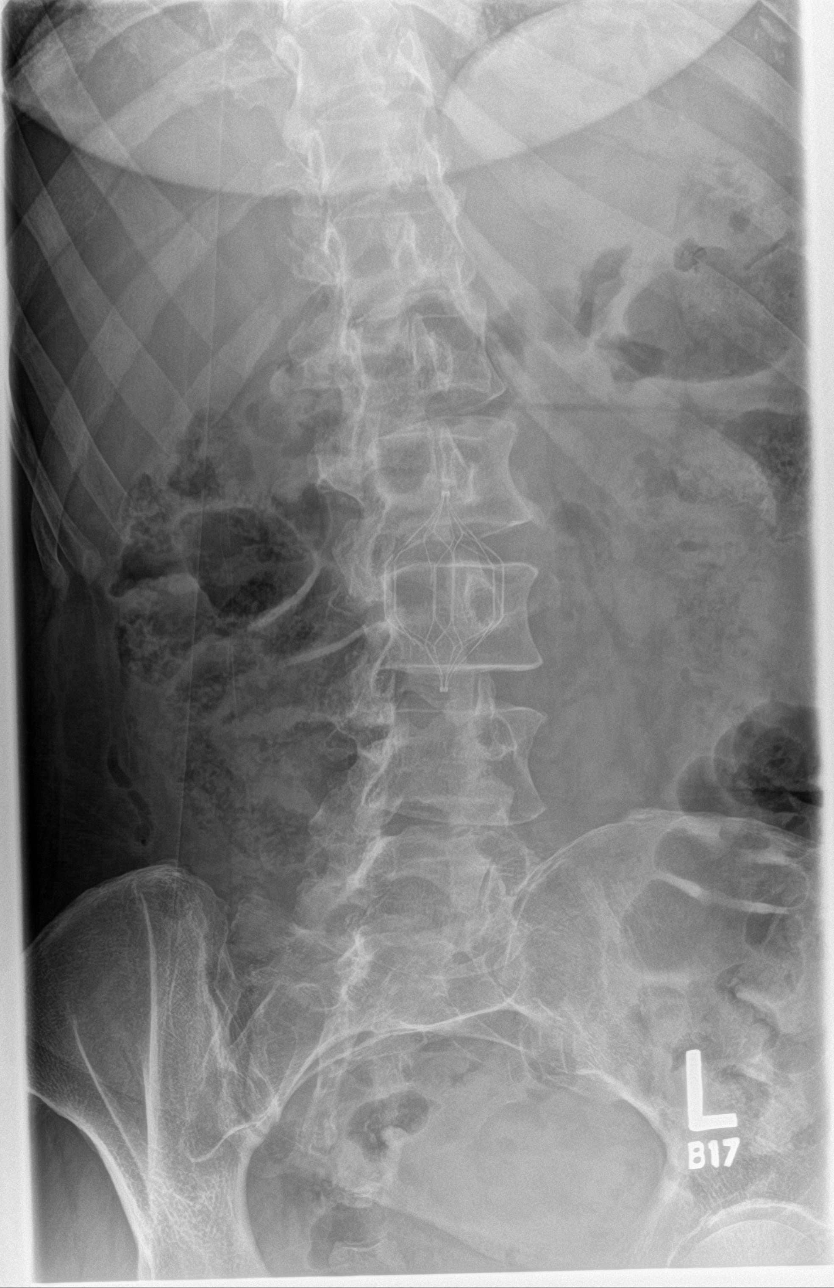

[l-spine lat]
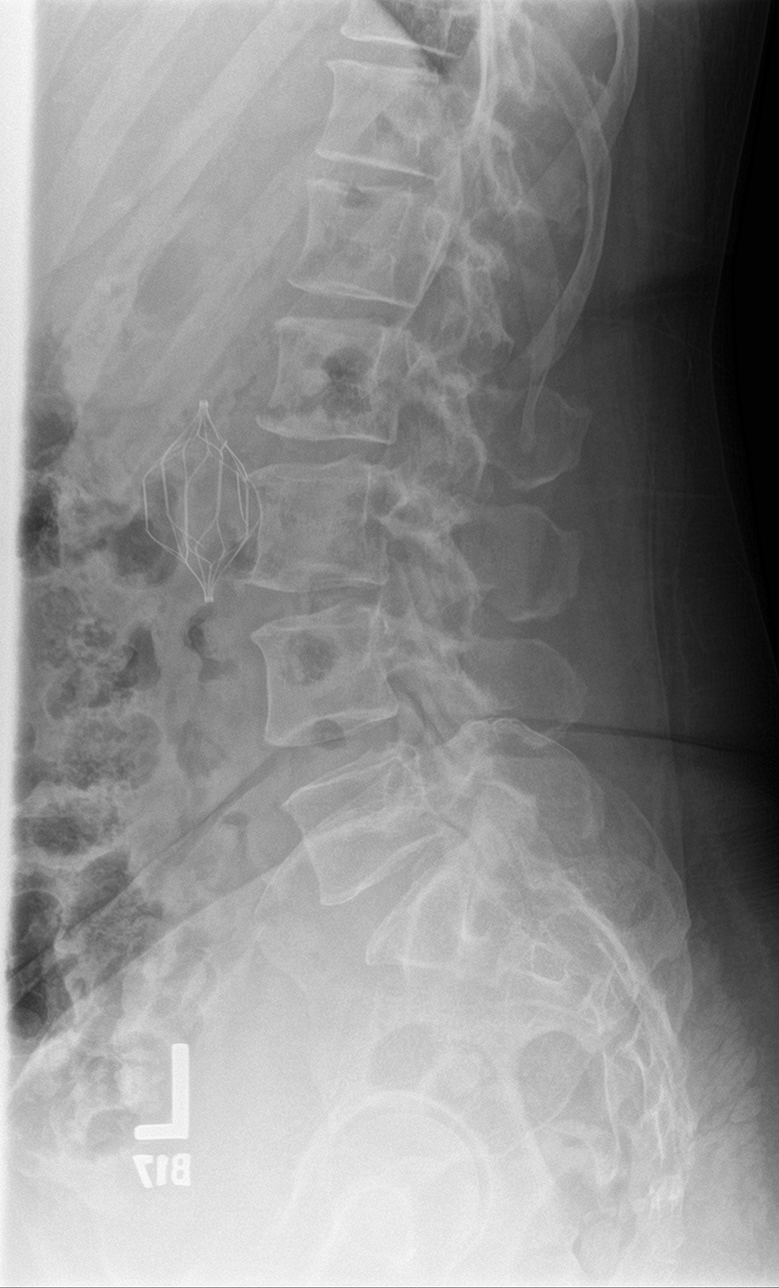

[l-spine spot]
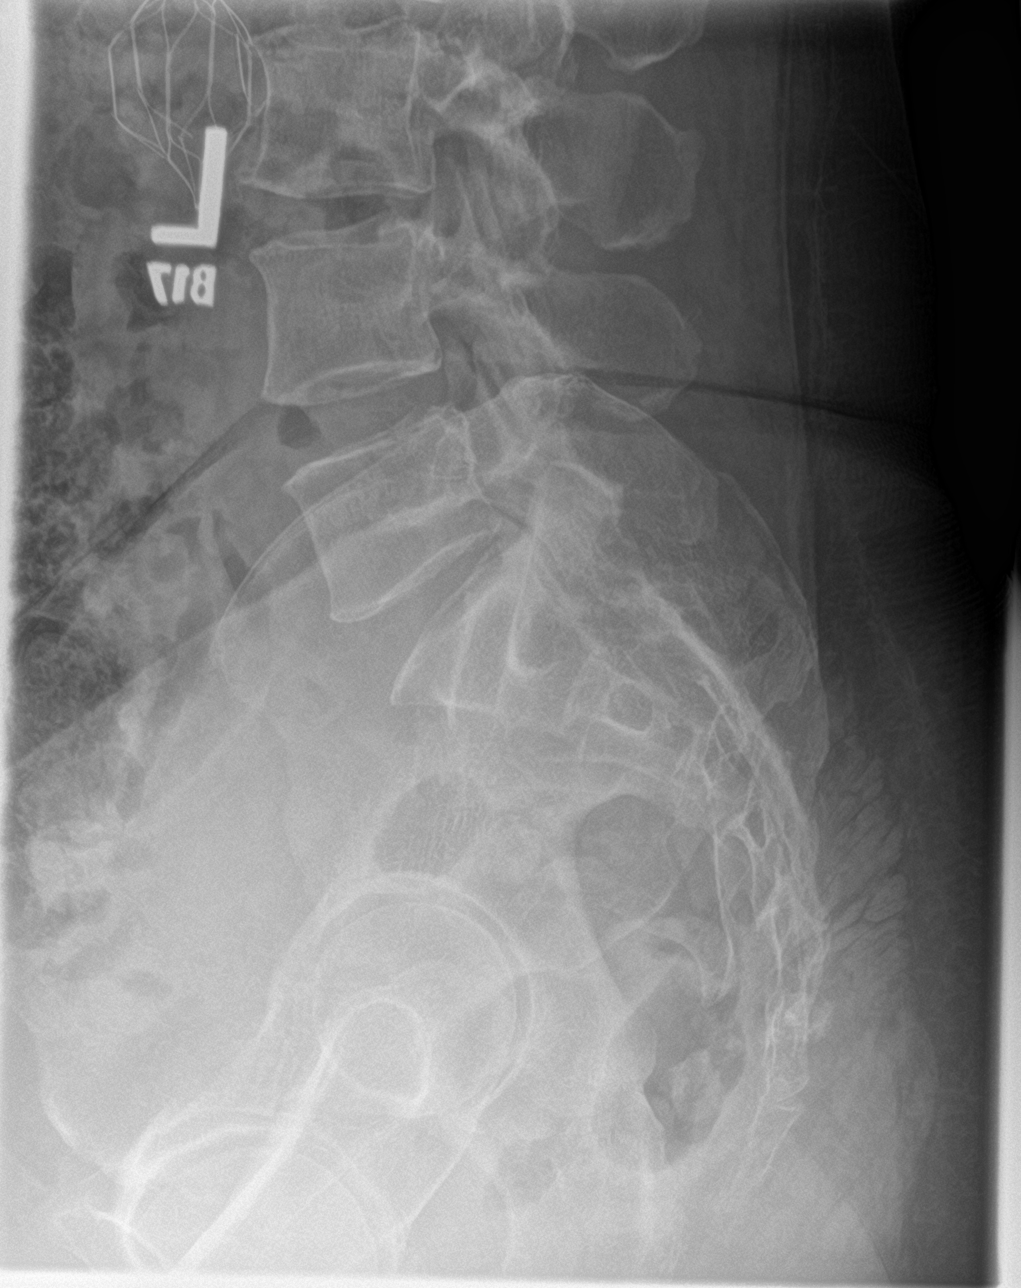

[5 of 5 positions shown; findings below may reference images not displayed]

FINDINGS: Frontal, lateral, spot lumbosacral lateral, and bilateral oblique
views were obtained. There are 5 non-rib-bearing lumbar type
vertebral bodies. There is slight lumbar levoscoliosis. There is no
fracture or spondylolisthesis. The disc spaces appear unremarkable.
There is no appreciable facet arthropathy. There is a filter in the
inferior vena cava.
IMPRESSION: Slight scoliosis. No fracture or spondylolisthesis. No appreciable
arthropathy. Inferior vena cava filter present.

## 2018-08-17 MED ORDER — ALBUTEROL SULFATE HFA 108 (90 BASE) MCG/ACT IN AERS: 1.0000 | INHALATION_SPRAY | Freq: Four times a day (QID) | RESPIRATORY_TRACT | 5 refills | Status: DC | PRN

## 2018-08-17 NOTE — Patient Instructions (Signed)
Chronic Back Pain When back pain lasts longer than 3 months, it is called chronic back pain. Pain may get worse at certain times (flare-ups). There are things you can do at home to manage your pain. Follow these instructions at home: Activity      Avoid bending and other activities that make pain worse.  When standing: ? Keep your upper back and neck straight. ? Keep your shoulders pulled back. ? Avoid slouching.  When sitting: ? Keep your back straight. ? Relax your shoulders. Do not round your shoulders or pull them backward.  Do not sit or stand in one place for long periods of time.  Take short rest breaks during the day. Lying down or standing is usually better than sitting. Resting can help relieve pain.  When sitting or lying down for a long time, do some mild activity or stretching. This will help to prevent stiffness and pain.  Get regular exercise. Ask your doctor what activities are safe for you.  Do not lift anything that is heavier than 10 lb (4.5 kg). To prevent injury when you lift things: ? Bend your knees. ? Keep the weight close to your body. ? Avoid twisting. Managing pain  If told, put ice on the painful area. Your doctor may tell you to use ice for 24-48 hours after a flare-up starts. ? Put ice in a plastic bag. ? Place a towel between your skin and the bag. ? Leave the ice on for 20 minutes, 2-3 times a day.  If told, put heat on the painful area as often as told by your doctor. Use the heat source that your doctor recommends, such as a moist heat pack or a heating pad. ? Place a towel between your skin and the heat source. ? Leave the heat on for 20-30 minutes. ? Remove the heat if your skin turns bright red. This is especially important if you are unable to feel pain, heat, or cold. You may have a greater risk of getting burned.  Soak in a warm bath. This can help relieve pain.  Take over-the-counter and prescription medicines only as told by your  doctor. General instructions  Sleep on a firm mattress. Try lying on your side with your knees slightly bent. If you lie on your back, put a pillow under your knees.  Keep all follow-up visits as told by your doctor. This is important. Contact a doctor if:  You have pain that does not get better with rest or medicine. Get help right away if:  One or both of your arms or legs feel weak.  One or both of your arms or legs lose feeling (numbness).  You have trouble controlling when you poop (bowel movement) or pee (urinate).  You feel sick to your stomach (nauseous).  You throw up (vomit).  You have belly (abdominal) pain.  You have shortness of breath.  You pass out (faint). Summary  When back pain lasts longer than 3 months, it is called chronic back pain.  Pain may get worse at certain times (flare-ups).  Use ice and heat as told by your doctor. Your doctor may tell you to use ice after flare-ups. This information is not intended to replace advice given to you by your health care provider. Make sure you discuss any questions you have with your health care provider. Document Released: 01/05/2008 Document Revised: 03/03/2017 Document Reviewed: 03/03/2017 Elsevier Interactive Patient Education  2019 [employer].

## 2018-08-17 NOTE — Progress Notes (Signed)
Subjective:  Patient ID: [name], female    DOB: 10/14/1970  Age: 48 y.o. MRN: [id number]  CC: Annual Exam; Back Pain; Cough; and Asthma   HPI [name] presents for a CPX.  She complains of a one-month history of nonproductive cough with intermittent wheezing and shortness of breath.  She has had these symptoms for years status post multiple pulmonary emboli related to antiphospholipid syndrome.  She has been well maintained on Xarelto which she is compliant with.  She gets adequate symptom relief with albuterol.  She also complains of a 1 to 2-year history of intermittent low back pain.  The back pain increases with activity and does not radiate into her lower extremities.  She denies lower extremity paresthesias.  She tells me that this is related to a workers comp injury and she is taking a medication prescribed by [employer] provider but she does not know what the medication is.  Outpatient Medications Prior to Visit  Medication Sig Dispense Refill  . acetaminophen (TYLENOL) 500 MG tablet Take 500 mg by mouth every 6 (six) hours as needed for headache (pain).    . rivaroxaban (XARELTO) 20 MG TABS tablet Take 20 mg by mouth daily with breakfast.     . albuterol (PROVENTIL HFA;VENTOLIN HFA) 108 (90 Base) MCG/ACT inhaler Inhale 2 puffs into the lungs daily as needed for shortness of breath. For shortness of breath or wheezing 1 Inhaler 5  . albuterol (PROVENTIL HFA;VENTOLIN HFA) 108 (90 Base) MCG/ACT inhaler Inhale 2 puffs into the lungs every 6 (six) hours as needed for wheezing or shortness of breath.    . Multiple Vitamin (MULTIVITAMIN WITH MINERALS) TABS tablet Take 1 tablet by mouth daily. Centrum    . Multiple Vitamins-Minerals (WOMENS MULTIVITAMIN PO) Take by mouth.    . fluticasone (FLONASE) 50 MCG/ACT nasal spray Place 2 sprays into both nostrils daily. (Patient not taking: Reported on 12/14/2017) 16 g 11  . fluticasone (FLONASE) 50 MCG/ACT nasal spray Place  1 spray into both nostrils daily as needed for allergies or rhinitis.    . promethazine-codeine (PHENERGAN WITH CODEINE) 6.25-10 MG/5ML syrup Take 5 mLs by mouth every 6 (six) hours as needed. (Patient not taking: Reported on 12/14/2017) 180 mL 0  . XARELTO 20 MG TABS tablet TAKE 1 TABLET BY MOUTH ONCE DAILY IN THE MORNING 30 tablet 5   No facility-administered medications prior to visit.     ROS Review of Systems  Constitutional: Negative for appetite change, diaphoresis, fatigue and unexpected weight change.  HENT: Negative.  Negative for sore throat, trouble swallowing and voice change.   Eyes: Negative for visual disturbance.  Respiratory: Positive for cough, shortness of breath and wheezing. Negative for chest tightness and stridor.   Cardiovascular: Negative for chest pain, palpitations and leg swelling.  Gastrointestinal: Negative for abdominal pain, constipation, diarrhea, nausea and vomiting.  Endocrine: Negative.   Genitourinary: Negative.  Negative for difficulty urinating.  Musculoskeletal: Positive for back pain. Negative for arthralgias and myalgias.  Skin: Negative.  Negative for color change, pallor and rash.  Neurological: Negative.  Negative for dizziness, weakness, light-headedness and headaches.  Hematological: Negative for adenopathy. Does not bruise/bleed easily.  Psychiatric/Behavioral: Negative.     Objective:  BP 122/80 (BP Location: Left Arm, Patient Position: Sitting, Cuff Size: Normal)   Pulse 84   Temp 98.2 F (36.8 C) (Oral)   Resp 16   Ht 5' 5"  (1.651 m)   Wt 183 lb 12 oz (83.3 kg)  LMP 07/24/2018 (Exact Date)   SpO2 99%   BMI 30.58 kg/m   BP Readings from Last 3 Encounters:  08/17/18 122/80  01/28/18 120/84  12/14/17 110/78    Wt Readings from Last 3 Encounters:  08/17/18 183 lb 12 oz (83.3 kg)  01/27/18 160 lb (72.6 kg)  12/14/17 175 lb (79.4 kg)    Physical Exam Constitutional:      General: She is not in acute distress.     Appearance: She is obese. She is not ill-appearing, toxic-appearing or diaphoretic.  HENT:     Right Ear: There is no impacted cerumen.     Left Ear: There is no impacted cerumen.     Nose: Rhinorrhea present. No congestion.     Mouth/Throat:     Mouth: Mucous membranes are moist.     Pharynx: Oropharynx is clear. No oropharyngeal exudate or posterior oropharyngeal erythema.  Eyes:     General: No scleral icterus.    Conjunctiva/sclera: Conjunctivae normal.  Neck:     Musculoskeletal: Normal range of motion and neck supple. No muscular tenderness.  Cardiovascular:     Rate and Rhythm: Normal rate and regular rhythm.     Heart sounds: No murmur. No friction rub. No gallop.   Pulmonary:     Effort: Pulmonary effort is normal. No respiratory distress.     Breath sounds: No stridor. No wheezing, rhonchi or rales.  Abdominal:     General: Abdomen is flat. Bowel sounds are normal.     Palpations: There is no hepatomegaly, splenomegaly or mass.     Tenderness: There is no abdominal tenderness. There is no guarding.     Hernia: No hernia is present.  Musculoskeletal: Normal range of motion.        General: No swelling.     Lumbar back: Normal. She exhibits normal range of motion, no tenderness, no bony tenderness, no swelling, no edema and no deformity.     Right lower leg: No edema.     Left lower leg: No edema.     Comments: Neg SLR in BLE  Lymphadenopathy:     Cervical: No cervical adenopathy.  Skin:    General: Skin is warm and dry.     Coloration: Skin is not pale.     Findings: No rash.  Neurological:     General: No focal deficit present.     Mental Status: She is oriented to person, place, and time. Mental status is at baseline.     Cranial Nerves: No cranial nerve deficit.     Sensory: Sensation is intact.     Motor: No weakness or atrophy.     Coordination: Coordination is intact. Romberg sign negative. Coordination normal.     Deep Tendon Reflexes: Reflexes normal.      Reflex Scores:      Tricep reflexes are 1+ on the right side and 1+ on the left side.      Bicep reflexes are 1+ on the right side and 1+ on the left side.      Brachioradialis reflexes are 1+ on the right side and 1+ on the left side.      Patellar reflexes are 1+ on the right side and 1+ on the left side.      Achilles reflexes are 1+ on the right side and 1+ on the left side.    Comments: Neg SLR in BLE  Psychiatric:        Mood and Affect: Mood normal.  Behavior: Behavior normal.        Thought Content: Thought content normal.        Judgment: Judgment normal.     Lab Results  Component Value Date   WBC 3.4 (L) 08/17/2018   HGB 14.6 08/17/2018   HCT 42.9 08/17/2018   PLT 246.0 08/17/2018   GLUCOSE 127 (H) 08/17/2018   CHOL 184 08/17/2018   TRIG 103.0 08/17/2018   HDL 49.60 08/17/2018   LDLCALC 114 (H) 08/17/2018   ALT 11 08/17/2018   AST 18 08/17/2018   NA 138 08/17/2018   K 3.5 08/17/2018   CL 103 08/17/2018   CREATININE 0.98 08/17/2018   BUN 11 08/17/2018   CO2 28 08/17/2018   TSH 1.43 06/30/2015   INR 2.80 03/20/2012   HGBA1C 5.6 08/17/2018    Mm Diag Breast Tomo Uni Right  Result Date: 06/02/2018 CLINICAL DATA:  Screening recall for a right breast asymmetry. EXAM: DIGITAL DIAGNOSTIC UNILATERAL RIGHT MAMMOGRAM WITH CAD AND TOMO COMPARISON:  Previous exam(s). ACR Breast Density Category b: There are scattered areas of fibroglandular density. FINDINGS: The asymmetry in the retroareolar right breast resolves on tomosynthesis imaging. The breast tissue in this region appears stable as compared to multiple prior mammograms. Mammographic images were processed with CAD. IMPRESSION: Resolution of the right breast asymmetry consistent with overlapping fibroglandular tissue. RECOMMENDATION: Screening mammogram in one year.(Code:SM-B-01Y) I have discussed the findings and recommendations with the patient. Results were also provided in writing at the conclusion of the visit.  If applicable, a reminder letter will be sent to the patient regarding the next appointment. BI-RADS CATEGORY  1: Negative. Electronically Signed   By: Ammie Ferrier M.D.   On: 06/02/2018 10:13    Assessment & Plan:   Rajanae was seen today for annual exam, back pain, cough and asthma.  Diagnoses and all orders for this visit:  Chronic viral hepatitis B without delta agent and without coma ([place])- Her LFTs are normal.  Her B antigen level is negative.  This is reassuring that there is no evidence of reactivation.  Her AFP is not elevated.  I have asked her to undergo an ultrasound to screen for [facility]. -     Hepatitis B surface antigen; Future -     Hepatic function panel; Future -     Hepatitis C antibody; Future -     AFP tumor marker; Future -     US Abdomen Limited RUQ; Future  Iron deficiency anemia secondary to inadequate dietary iron intake- Her H&H are normal now. -     CBC with Differential/Platelet; Future  Prediabetes- Her A1c is mildly elevated at 5.6%.  Medical therapy is not indicated. -     Basic metabolic panel; Future -     Hemoglobin A1c; Future  Preventative health care- Exam completed, labs reviewed, vaccines reviewed and updated, Pap and mammogram are up-to-date, Cologuard was ordered to screen for colon cancer/polyps. -     Lipid panel; Future -     HIV Antibody (routine testing w rflx); Future -     Hepatitis C antibody; Future  Colon cancer screening -     Cologuard  Need for influenza vaccination -     Flu Vaccine QUAD 36+ mos IM  Chronic midline low back pain, unspecified whether sciatica present- She has no alarming symptoms.  Plain films are unremarkable and her neurologic exam does not show any concerns for radiculopathy.  She will continue with treatments that are being offered by  workers comp. -     Urinalysis, Routine w reflex microscopic; Future -     DG Lumbar Spine Complete; Future  Mild intermittent asthma without complication -      albuterol (VENTOLIN HFA) 108 (90 Base) MCG/ACT inhaler; Inhale 1-2 puffs into the lungs every 6 (six) hours as needed for wheezing or shortness of breath.  Antiphospholipid syndrome ([place])- I will recheck her antiphospholipid antibody level to see if this is still present. -     Antiphospholipid Syndrome Diagnostic Panel; Future  Cough- Her chest x-ray is negative for mass or infiltrate.  Will treat for asthma. -     DG Chest 2 View; Future   I have discontinued Chisom T. Eliot's albuterol, promethazine-codeine, Multiple Vitamins-Minerals (WOMENS MULTIVITAMIN PO), XARELTO, albuterol, and multivitamin with minerals. I am also having her start on albuterol. Additionally, I am having her maintain her fluticasone, rivaroxaban, and acetaminophen.  Meds ordered this encounter  Medications  . albuterol (VENTOLIN HFA) 108 (90 Base) MCG/ACT inhaler    Sig: Inhale 1-2 puffs into the lungs every 6 (six) hours as needed for wheezing or shortness of breath.    Dispense:  18 g    Refill:  5     Follow-up: Return in about 4 weeks (around 09/14/2018).  Scarlette Calico, MD

## 2018-08-21 ENCOUNTER — Telehealth: Payer: Self-pay

## 2018-08-21 ENCOUNTER — Other Ambulatory Visit: Payer: BLUE CROSS/BLUE SHIELD

## 2018-08-21 DIAGNOSIS — D6861 Antiphospholipid syndrome: Secondary | ICD-10-CM

## 2018-08-21 NOTE — Telephone Encounter (Signed)
Lab called and stated that the specimen was rejected by [facility].  Lab to be redrawn is the antiphospholipid syndrome diagnostic panel.   Requesting pt to go to a [facility] lab to have this lab drawn. If pt agrees we will send a requisition to the [facility] lab she chooses.   [place] [address] [place] [phone] [place], [place] [address]. [phone].

## 2018-08-21 NOTE — Telephone Encounter (Signed)
Faxed req to [facility].   Will follow up on the pain medicaiton rq with PCP.

## 2018-08-21 NOTE — Telephone Encounter (Signed)
Patient will be going to [facility] located on [address] she has been given address/phone number---please fax order needed to [facility] on [address]---also patient asking about xray results for her back---i'm showing it to be mild scoliosis, patient is asking what she can do for the pain, if appropriate, can you please send something in prescription strength for back pain? Routing to stefannie, please advise, thanks

## 2018-08-22 LAB — HEPATITIS B SURFACE ANTIGEN: Hepatitis B Surface Ag: NONREACTIVE

## 2018-08-22 LAB — HEPATITIS C ANTIBODY
Hepatitis C Ab: NONREACTIVE
SIGNAL TO CUT-OFF: 0.06 (ref <1.00)

## 2018-08-22 LAB — ANTIPHOSPHOLIPID SYNDROME DIAGNOSTIC PANEL
Anticardiolipin IgA: <11 [APL'U] (ref <=11)
Anticardiolipin IgG: <14 [GPL'U] (ref <=14)
Beta-2 Glyco I IgG: <9 SGU (ref <=20)

## 2018-08-22 LAB — AFP TUMOR MARKER: AFP TUMOR MARKER: 3.7 ng/mL

## 2018-08-22 LAB — HIV ANTIBODY (ROUTINE TESTING W REFLEX): HIV 1&2 Ab, 4th Generation: NONREACTIVE

## 2018-08-25 ENCOUNTER — Telehealth: Payer: Self-pay | Admitting: Internal Medicine

## 2018-08-25 NOTE — Telephone Encounter (Signed)
Pt scheduled at [facility]

## 2018-08-25 NOTE — Telephone Encounter (Signed)
Patient has [place] and is in [place], please send to [place] facility

## 2018-08-25 NOTE — Telephone Encounter (Signed)
Can we send pt a different scan facility?

## 2018-08-25 NOTE — Telephone Encounter (Signed)
Copied from [place] [phone]. Topic: General - Other >> Aug 25, 2018  1:41 PM Keene Breath wrote: Reason for CRM: Patient called to inform the doctor that the referral he sent her to is not covered through her insurance.  Patient was told that she would need a referral to a [facility] specialist.  Please advise and call patient to see if she can get another referral.  CB# [phone]

## 2018-08-30 LAB — COLOGUARD: COLOGUARD: NEGATIVE

## 2018-09-01 NOTE — Assessment & Plan Note (Signed)
U/S JAN 2020 GB sludge and NASH but no mass

## 2018-09-12 ENCOUNTER — Encounter: Payer: Self-pay | Admitting: Internal Medicine

## 2018-09-14 ENCOUNTER — Other Ambulatory Visit: Payer: Self-pay | Admitting: Internal Medicine

## 2018-09-14 DIAGNOSIS — I2724 Chronic thromboembolic pulmonary hypertension: Secondary | ICD-10-CM

## 2018-09-14 DIAGNOSIS — D6861 Antiphospholipid syndrome: Secondary | ICD-10-CM

## 2018-09-15 ENCOUNTER — Other Ambulatory Visit: Payer: BLUE CROSS/BLUE SHIELD

## 2018-09-15 DIAGNOSIS — D6861 Antiphospholipid syndrome: Secondary | ICD-10-CM

## 2018-09-15 DIAGNOSIS — I2724 Chronic thromboembolic pulmonary hypertension: Secondary | ICD-10-CM

## 2018-09-18 ENCOUNTER — Other Ambulatory Visit: Payer: Self-pay | Admitting: Internal Medicine

## 2018-09-18 LAB — DRVVT MIX: dRVVT Mix: 49.9 s — ABNORMAL HIGH (ref 0.0–47.0)

## 2018-09-18 LAB — LUPUS ANTICOAGULANT
DPT: 53 s (ref 0.0–55.0)
DRVVT: 66.2 s — AB (ref 0.0–47.0)
PTT Lupus Anticoagulant: 33.6 s (ref 0.0–51.9)
Thrombin Time: 19.4 s (ref 0.0–23.0)
dPT Confirm Ratio: 0.93 Ratio (ref 0.00–1.40)

## 2018-09-18 LAB — DRVVT CONFIRM: dRVVT Confirm: 1.3 ratio — ABNORMAL HIGH (ref 0.8–1.2)

## 2018-10-05 ENCOUNTER — Ambulatory Visit: Payer: Self-pay | Admitting: Internal Medicine

## 2018-10-17 ENCOUNTER — Other Ambulatory Visit: Payer: Self-pay | Admitting: Pulmonary Disease

## 2018-10-27 ENCOUNTER — Ambulatory Visit: Payer: Self-pay | Admitting: Pulmonary Disease

## 2018-11-20 ENCOUNTER — Telehealth: Payer: Self-pay | Admitting: Pulmonary Disease

## 2018-11-20 NOTE — Telephone Encounter (Signed)
Called and spoke with [name]. [name] stated to me that her work is making them wear a mask and she stated she isn't able to breathe with a mask on and wanted to know what we recommended. I stated to her that she needed to discuss with her work to see if there was anything they could tell her in regards to this if she could possibly work from home or if not, what they recommended for her. [name] expressed understanding. Nothing further needed.

## 2018-11-27 ENCOUNTER — Other Ambulatory Visit: Payer: Self-pay | Admitting: Pulmonary Disease

## 2018-11-29 ENCOUNTER — Telehealth: Payer: Self-pay | Admitting: Pulmonary Disease

## 2018-11-29 ENCOUNTER — Other Ambulatory Visit: Payer: Self-pay | Admitting: Pulmonary Disease

## 2018-11-29 MED ORDER — RIVAROXABAN 20 MG PO TABS: ORAL_TABLET | ORAL | 0 refills | Status: DC

## 2018-11-29 NOTE — Telephone Encounter (Signed)
Returned call to patient Explained to patient that she needed 1 year f/u appt Explained reason for #30 no refill last month. Pt scheduled televisit 5/15 with TN Refill Xarelto for another #30 no refill until seen patient aware. Nothing further needed.

## 2018-12-15 ENCOUNTER — Encounter: Payer: Self-pay | Admitting: Nurse Practitioner

## 2018-12-15 ENCOUNTER — Ambulatory Visit (INDEPENDENT_AMBULATORY_CARE_PROVIDER_SITE_OTHER): Payer: BLUE CROSS/BLUE SHIELD | Admitting: Nurse Practitioner

## 2018-12-15 ENCOUNTER — Other Ambulatory Visit: Payer: Self-pay

## 2018-12-15 DIAGNOSIS — W19XXXA Unspecified fall, initial encounter: Secondary | ICD-10-CM

## 2018-12-15 DIAGNOSIS — I2724 Chronic thromboembolic pulmonary hypertension: Secondary | ICD-10-CM | POA: Diagnosis not present

## 2018-12-15 DIAGNOSIS — R55 Syncope and collapse: Secondary | ICD-10-CM

## 2018-12-15 NOTE — Progress Notes (Signed)
Called patient at 3:43p.m. Patient states that her headache has subsided and she had family there with her. She states that she is feeling much better now.

## 2018-12-15 NOTE — Progress Notes (Signed)
Virtual Visit via Telephone Note  I connected with [name] on 12/15/18 at 10:00 AM EDT by telephone and verified that I am speaking with the correct person using two identifiers.  Location: Patient: home Provider: office   I discussed the limitations, risks, security and privacy concerns of performing an evaluation and management service by telephone and the availability of in person appointments. I also discussed with the patient that there may be a patient responsible charge related to this service. The patient expressed understanding and agreed to proceed.   History of Present Illness: 48 year old female with recurrent PE/DVT and chronic thrombolytic pulmonary hypertension on lifelong anticoagulant followed by Dr. Halford Chessman. Patient has history of pulmonary endarterectomy at [facility] in 2007  Patient has a tele-visit today for a routine follow-up.  She states that overall this is been a stable interval for her.  She was last seen by Dr. Halford Chessman on 10/10/2017.  She states that she has been doing well until yesterday.  Unfortunately she was told that her brother-in-law passed away yesterday.  She she then had a syncopal episode at work and EMT was called.  Patient refused to go to the emergency room at that time.  She states that since this happened she has had severe headaches.  She states that she continues to have a severe headache today.  She has taken Tylenol with minimal relief noted.  She denies any memory loss she is alert and oriented over the phone.  She states that she has not felt confused.  Patient is on Xarelto and denies any bleeding or bruising.  She does not know if she hit her head when she fell/passed out.  Denies f/c/s, n/v/d, hemoptysis, PND, leg swelling.    Observations/Objective: Pulmonary tests: V/Q scan 03/18/06 >> multiple b/l large mismatched segmental perfusion defects from chronic PE PFT 11/14/12 >> FEV1 2.52 (95%), FEV1% 92, TLC 3.75 (70%), DLCO 76%, no BD CT chest  12/14/12 >> 3 mm RUL nodule, scar LUL posterior segment V/Q scan 06/06/14 >> very low probability for PE CT chest 01/16/15 >> mild dilation of pulmonary trunk  Cardiac tests Echo 06/11/14 >> EF 55 to 60%, mild TR and PR  Assessment and Plan: CTEPH (chronic thromboembolic pulmonary hypertension) ([place])  Syncope, unspecified syncope type  Fall, initial encounter  Patient has been stable over the past year.  I am concerned that she had a syncopal episode after finding out that her brother-in-law passed away yesterday and fell at work.  She is complaining of severe headaches today.  Denies patient to go directly to the ED but patient refused at this time.  She states that she would like to wait a little while and try some Tylenol first.  We will call patient back after lunch to check on her status and if she is still having severe headaches will advise her to go directly to the ED.  Patient Instructions  Syncope / fall yesterday: Advised patient to go directly to the ED due to severe headache since her syncopal episode yesterday and fall.  Concerned because patient is on Xarelto and could have hit her head when she fell.  She refuses to go at this time she states that she would like to rest and take some Tylenol to see if it helps.  Advised patient that we will call her back after lunch to check on her status.  If she is still having severe headache she will need to go directly to the ED.  Patient agreed.  CTEPH: Continue xarelto - will refill    Follow Up Instructions:  Follow up with Dr. Halford Chessman in 1 year or sooner if needed   I discussed the assessment and treatment plan with the patient. The patient was provided an opportunity to ask questions and all were answered. The patient agreed with the plan and demonstrated an understanding of the instructions.   The patient was advised to call back or seek an in-person evaluation if the symptoms worsen or if the condition fails to improve as  anticipated.  I provided 23 minutes of non-face-to-face time during this encounter.   Fenton Foy, NP

## 2018-12-15 NOTE — Patient Instructions (Addendum)
Syncope / fall yesterday: Advised patient to go directly to the ED due to severe headache since her syncopal episode yesterday and fall.  Concerned because patient is on Xarelto and could have hit her head when she fell.  She refuses to go at this time she states that she would like to rest and take some Tylenol to see if it helps.  Advised patient that we will call her back after lunch to check on her status.  If she is still having severe headache she will need to go directly to the ED.  Patient agreed.  CTEPH: Continue xarelto - will refill  Follow up: Follow up with Dr. Halford Chessman in 1 year or sooner if needed

## 2018-12-15 NOTE — Assessment & Plan Note (Signed)
Patient has been stable over the past year.  I am concerned that she had a syncopal episode after finding out that her brother-in-law passed away yesterday and fell at work.  She is complaining of severe headaches today.  Denies patient to go directly to the ED but patient refused at this time.  She states that she would like to wait a little while and try some Tylenol first.  We will call patient back after lunch to check on her status and if she is still having severe headaches will advise her to go directly to the ED.  Patient Instructions  Syncope / fall yesterday: Advised patient to go directly to the ED due to severe headache since her syncopal episode yesterday and fall.  Concerned because patient is on Xarelto and could have hit her head when she fell.  She refuses to go at this time she states that she would like to rest and take some Tylenol to see if it helps.  Advised patient that we will call her back after lunch to check on her status.  If she is still having severe headache she will need to go directly to the ED.  Patient agreed.  CTEPH: Continue xarelto - will refill  Follow up: Follow up with Dr. Halford Chessman in 1 year or sooner if needed

## 2018-12-22 ENCOUNTER — Telehealth: Payer: Self-pay | Admitting: Pulmonary Disease

## 2018-12-22 MED ORDER — RIVAROXABAN 20 MG PO TABS: ORAL_TABLET | ORAL | 1 refills | Status: DC

## 2018-12-22 NOTE — Telephone Encounter (Signed)
Spoke with pt, advised her that I sent her R for Xarelto to the [facility] on [facility]. Pt understood and nothing further is needed.

## 2019-01-19 ENCOUNTER — Ambulatory Visit: Payer: Self-pay | Admitting: Pulmonary Disease

## 2019-02-19 ENCOUNTER — Telehealth: Payer: Self-pay | Admitting: Nurse Practitioner

## 2019-02-19 MED ORDER — RIVAROXABAN 20 MG PO TABS: ORAL_TABLET | ORAL | 5 refills | Status: DC

## 2019-02-19 NOTE — Telephone Encounter (Signed)
Called patient and confirmed Xarelto 20 mg [facility] [address]. Refill submitted Nothing further needed.

## 2019-05-04 ENCOUNTER — Other Ambulatory Visit: Payer: Self-pay | Admitting: Family Medicine

## 2019-05-04 DIAGNOSIS — Z1231 Encounter for screening mammogram for malignant neoplasm of breast: Secondary | ICD-10-CM

## 2019-05-15 ENCOUNTER — Telehealth: Payer: Self-pay | Admitting: Pulmonary Disease

## 2019-05-15 NOTE — Telephone Encounter (Signed)
Spoke with the pt  She is requesting a letter for her work that explains her medical condition  She states that she has a hard time wearing a mask over her mouth and nose I explained to her that we can not provide any kind of letter excusing her from wearing a mask and she understands this but still wants to have documentation of her lung problem in a letter to provide to her employer  Please advise thanks

## 2019-05-18 ENCOUNTER — Encounter: Payer: Self-pay | Admitting: Pulmonary Disease

## 2019-05-18 NOTE — Telephone Encounter (Signed)
I have completed letter with statement of her pulmonary condition and current treatment strategy.  Printed, signed and given to [place].

## 2019-05-18 NOTE — Telephone Encounter (Signed)
Called patient and advised letter was ready for her to pick up. Nothing further needed at this time.

## 2019-05-18 NOTE — Telephone Encounter (Signed)
Dr. Halford Chessman - please advise. Thanks.

## 2019-08-13 ENCOUNTER — Telehealth: Payer: Self-pay | Admitting: Pulmonary Disease

## 2019-08-13 MED ORDER — RIVAROXABAN 20 MG PO TABS: ORAL_TABLET | ORAL | 5 refills | Status: DC

## 2019-08-13 NOTE — Telephone Encounter (Signed)
I have sent refill to the pharmacy and spoke with patient and made her aware.

## 2019-12-19 ENCOUNTER — Encounter: Payer: Self-pay | Admitting: Pulmonary Disease

## 2019-12-19 ENCOUNTER — Ambulatory Visit: Payer: BC Managed Care – PPO | Admitting: Pulmonary Disease

## 2019-12-19 ENCOUNTER — Other Ambulatory Visit: Payer: Self-pay

## 2019-12-19 VITALS — BP 124/66 | HR 80 | Temp 97.3°F | Ht 64.0 in | Wt 192.0 lb

## 2019-12-19 DIAGNOSIS — Z7184 Encounter for health counseling related to travel: Secondary | ICD-10-CM | POA: Diagnosis not present

## 2019-12-19 DIAGNOSIS — I2724 Chronic thromboembolic pulmonary hypertension: Secondary | ICD-10-CM | POA: Diagnosis not present

## 2019-12-19 MED ORDER — RIVAROXABAN 20 MG PO TABS: ORAL_TABLET | ORAL | 3 refills | Status: DC

## 2019-12-19 NOTE — Patient Instructions (Signed)
Lab tests today  Will call you with recommendations from [facility] regarding prophylactic measures prior to travel to [place]  Follow up in 1 year

## 2019-12-19 NOTE — Progress Notes (Signed)
[name]  Chief Complaint  Patient presents with  . Follow-up    Constitutional:  BP 124/66 (BP Location: Right Arm, Cuff Size: Normal)   Pulse 80   Temp (!) 97.3 F (36.3 C) (Temporal)   Ht 5' 4"  (1.626 m)   Wt 192 lb (87.1 kg)   SpO2 100% Comment: RA  BMI 32.96 kg/m   Past Medical History:  Hep B, Allergies  Summary:  [name] is a 49 y.o. female with recurrent PE/DVT and chronic thromboembolic pulmonary hypertension on life long anticoagulation. She had pulmonary endarterectomy at [facility] in 2007  Subjective:  Denies chest pain, dyspnea.  Gets occasional Rt leg cramp.  Last about 15 minutes.  Denies bruising, bleeding.  Planning trip to [place] for 3 months.  Got COVID vaccine.  Physical Exam:   Appearance - well kempt  ENMT - no sinus tenderness, no nasal discharge, no oral exudate, Mallampati 2  Respiratory - no wheeze, or rales  CV - regular rate and rhythm, no murmurs  GI - soft, non tender  Lymph - no adenopathy noted in neck  Ext - no edema  Skin - no rashes  Neuro - normal strength, oriented x 3  Psych - normal mood and affect   Assessment/Plan:   Chronic thromboembolic pulmonary hypertension. - she is on lifelong xarelto - 3 month refill sent for her travel to [place]  International travel to [place]. - will call her with CDC recommendations after I have a chance to review this information  A total of 21 minutes addressing patient care on the day of the visit.  Follow up:  Patient Instructions  Lab tests today  Will call you with recommendations from CDC regarding prophylactic measures prior to travel to [place]  Follow up in 1 year   Signature:  Chesley Mires, MD [place] Pager: [phone] 12/19/2019, 9:50 AM  Flow Sheet     Pulmonary tests:  PFT 11/14/12 >> FEV1 2.52 (95%), FEV1% 92, TLC 3.75 (70%), DLCO 76%, no BD  Chest imaging:  V/Q scan 03/18/06 >> multiple  b/l large mismatched segmental perfusion defects from chronic PE CT chest 12/14/12 >> 3 mm RUL nodule, scar LUL posterior segment V/Q scan 06/06/14 >> very low probability for PE CT chest 01/16/15 >> mild dilation of pulmonary trunk  Cardiac tests:  Echo 06/11/14 >> EF 55 to 60%, mild TR and PR  Medications:   Allergies as of 12/19/2019      Reactions   Aspirin Other (See Comments)   Other Reaction: GI Upset Other Reaction: GI Upset Since being in [place], told she has an ulcer   Aspirin Other (See Comments)   Gi upset/ulcer      Medication List       Accurate as of Dec 19, 2019  9:50 AM. If you have any questions, ask your nurse or doctor.        acetaminophen 500 MG tablet Commonly known as: TYLENOL Take 500 mg by mouth every 6 (six) hours as needed for headache (pain).   albuterol 108 (90 Base) MCG/ACT inhaler Commonly known as: Ventolin HFA Inhale 1-2 puffs into the lungs every 6 (six) hours as needed for wheezing or shortness of breath.   Centrum Adults Tabs Take by mouth.   fluticasone 50 MCG/ACT nasal spray Commonly known as: FLONASE Place 2 sprays into both nostrils daily.   rivaroxaban 20 MG Tabs tablet Commonly known as: Xarelto TAKE 1 TABLET BY MOUTH  ONCE DAILY IN THE MORNING       Past Surgical History:  She  has a past surgical history that includes Pulmonary artery endarterectomy (2007); Cardiac catheterization (11/2005); and Vena cava filter placement.  Family History:  Her family history includes Hypertension in her father and mother.  Social History:  She  reports that she has never smoked. She has never used smokeless tobacco. She reports that she does not drink alcohol or use drugs.

## 2019-12-21 ENCOUNTER — Telehealth: Payer: Self-pay | Admitting: Pulmonary Disease

## 2019-12-21 MED ORDER — MEFLOQUINE HCL 250 MG PO TABS: 250.0000 mg | ORAL_TABLET | ORAL | 0 refills | Status: DC

## 2019-12-21 NOTE — Telephone Encounter (Signed)
Reviewed CDC recommendations for travel to [place].  She believes she received vaccination for typhoid and yellow fever.  Advised her to contact [facility] Department if she needs these vaccinations.  Sent script for mefloquine 250 mg weekly.  She will start now with anticipated travel date for the first week of June.  She will be in [place] for 2 months.  Advised she will need to continue medication for 4 weeks after return to the [place].  Advised her to monitor for signs of mood alteration, and stop medication if she notices any of this.

## 2019-12-28 ENCOUNTER — Other Ambulatory Visit (INDEPENDENT_AMBULATORY_CARE_PROVIDER_SITE_OTHER): Payer: BC Managed Care – PPO

## 2019-12-28 DIAGNOSIS — I2724 Chronic thromboembolic pulmonary hypertension: Secondary | ICD-10-CM | POA: Diagnosis not present

## 2019-12-28 LAB — CBC WITH DIFFERENTIAL/PLATELET
Basophils Absolute: 0 10*3/uL (ref 0.0–0.1)
Basophils Relative: 0.5 % (ref 0.0–3.0)
Eosinophils Absolute: 0.2 10*3/uL (ref 0.0–0.7)
Eosinophils Relative: 3.7 % (ref 0.0–5.0)
HCT: 42.4 % (ref 36.0–46.0)
Hemoglobin: 14.1 g/dL (ref 12.0–15.0)
Lymphocytes Relative: 36.7 % (ref 12.0–46.0)
Lymphs Abs: 1.7 10*3/uL (ref 0.7–4.0)
MCHC: 33.2 g/dL (ref 30.0–36.0)
MCV: 87 fl (ref 78.0–100.0)
Monocytes Absolute: 0.5 10*3/uL (ref 0.1–1.0)
Monocytes Relative: 11.5 % (ref 3.0–12.0)
Neutro Abs: 2.2 10*3/uL (ref 1.4–7.7)
Neutrophils Relative %: 47.6 % (ref 43.0–77.0)
Platelets: 232 10*3/uL (ref 150.0–400.0)
RBC: 4.88 Mil/uL (ref 3.87–5.11)
RDW: 14 % (ref 11.5–15.5)
WBC: 4.7 10*3/uL (ref 4.0–10.5)

## 2019-12-28 LAB — COMPREHENSIVE METABOLIC PANEL
ALT: 18 U/L (ref 0–35)
AST: 23 U/L (ref 0–37)
Albumin: 4.1 g/dL (ref 3.5–5.2)
Alkaline Phosphatase: 81 U/L (ref 39–117)
BUN: 19 mg/dL (ref 6–23)
CO2: 30 mEq/L (ref 19–32)
Calcium: 9.5 mg/dL (ref 8.4–10.5)
Chloride: 101 mEq/L (ref 96–112)
Creatinine, Ser: 1.05 mg/dL (ref 0.40–1.20)
GFR: 55.24 mL/min — ABNORMAL LOW (ref >60.00)
Glucose, Bld: 187 mg/dL — ABNORMAL HIGH (ref 70–99)
Potassium: 3.9 mEq/L (ref 3.5–5.1)
Sodium: 137 mEq/L (ref 135–145)
Total Bilirubin: 0.5 mg/dL (ref 0.2–1.2)
Total Protein: 7.6 g/dL (ref 6.0–8.3)

## 2020-01-07 ENCOUNTER — Telehealth: Payer: Self-pay | Admitting: Pulmonary Disease

## 2020-01-07 NOTE — Telephone Encounter (Signed)
CBC    Component Value Date/Time   WBC 4.7 12/28/2019 1108   RBC 4.88 12/28/2019 1108   HGB 14.1 12/28/2019 1108   HCT 42.4 12/28/2019 1108   PLT 232.0 12/28/2019 1108   MCV 87.0 12/28/2019 1108   MCH 28.9 01/27/2018 2126   MCHC 33.2 12/28/2019 1108   RDW 14.0 12/28/2019 1108   LYMPHSABS 1.7 12/28/2019 1108   MONOABS 0.5 12/28/2019 1108   EOSABS 0.2 12/28/2019 1108   BASOSABS 0.0 12/28/2019 1108    CMP Latest Ref Rng & Units 12/28/2019 08/17/2018 01/27/2018  Glucose 70 - 99 mg/dL 187(H) 127(H) 119(H)  BUN 6 - 23 mg/dL 19 11 12   Creatinine 0.40 - 1.20 mg/dL 1.05 0.98 1.12(H)  Sodium 135 - 145 mEq/L 137 138 139  Potassium 3.5 - 5.1 mEq/L 3.9 3.5 4.0  Chloride 96 - 112 mEq/L 101 103 104  CO2 19 - 32 mEq/L 30 28 26   Calcium 8.4 - 10.5 mg/dL 9.5 9.5 9.1  Total Protein 6.0 - 8.3 g/dL 7.6 7.5 -  Total Bilirubin 0.2 - 1.2 mg/dL 0.5 0.7 -  Alkaline Phos 39 - 117 U/L 81 58 -  AST 0 - 37 U/L 23 18 -  ALT 0 - 35 U/L 18 11 -    Please let her know that her lab tests were normal except her blood sugar level was elevated.  She should follow up with her PCP to further assess whether she might be at risk for having diabetes.

## 2020-01-08 NOTE — Telephone Encounter (Signed)
ATC but she did not answer. No VM is setup. Will call back later.

## 2020-03-17 ENCOUNTER — Ambulatory Visit (INDEPENDENT_AMBULATORY_CARE_PROVIDER_SITE_OTHER): Payer: BC Managed Care – PPO | Admitting: *Deleted

## 2020-03-17 ENCOUNTER — Other Ambulatory Visit: Payer: Self-pay

## 2020-03-17 DIAGNOSIS — Z111 Encounter for screening for respiratory tuberculosis: Secondary | ICD-10-CM

## 2020-03-17 DIAGNOSIS — Z23 Encounter for immunization: Secondary | ICD-10-CM

## 2020-03-19 LAB — TB SKIN TEST
Induration: 0 mm
TB Skin Test: NEGATIVE

## 2020-10-27 ENCOUNTER — Telehealth (INDEPENDENT_AMBULATORY_CARE_PROVIDER_SITE_OTHER): Payer: BC Managed Care – PPO | Admitting: Internal Medicine

## 2020-10-27 ENCOUNTER — Encounter: Payer: Self-pay | Admitting: Internal Medicine

## 2020-10-27 DIAGNOSIS — R197 Diarrhea, unspecified: Secondary | ICD-10-CM | POA: Diagnosis not present

## 2020-10-27 MED ORDER — ONDANSETRON 4 MG PO TBDP
4.0000 mg | ORAL_TABLET | Freq: Three times a day (TID) | ORAL | 0 refills | Status: DC | PRN
Start: 2020-10-27 — End: 2020-12-22

## 2020-10-27 MED ORDER — LOPERAMIDE HCL 2 MG PO TABS
2.0000 mg | ORAL_TABLET | Freq: Four times a day (QID) | ORAL | 0 refills | Status: DC | PRN
Start: 2020-10-27 — End: 2020-12-22

## 2020-10-27 NOTE — Progress Notes (Signed)
Virtual Visit via Audio Note  I connected with [name] on 10/27/20 at  3:20 PM EDT by an audio enabled telemedicine application and verified that I am speaking with the correct person using two identifiers.  The patient and the provider were at separate locations throughout the entire encounter. Patient location: home, Provider location: work   I discussed the limitations of evaluation and management by telemedicine and the availability of in person appointments. The patient expressed understanding and agreed to proceed. The patient and the provider were the only parties present for the visit unless noted in HPI below.  History of Present Illness: The patient is a 50 y.o. female with visit for diarrhea. Started Thursday after eating with food not wanting to go down and with burning. Started vomiting 2-3 in the morning. No blood in vomit. Thursday-Saturday no appetite and even limited in drinking liquids. Started eating Sunday and then had diarrhea. Multiple times per day lost count. Mouth is dry now. No vomiting anymore and not nauseous. Appetite is still poor. Drank some gatorade this morning. No blood in the diarrhea. Has no fevers or chills. Denies sinus problems. No sick contacts. Has had 2 shots for covid-19 but no booster. Some stomach pain today general. Having some dizziness.   Observations/Objective: A and O times 3, no cough or dyspnea.  Assessment and Plan: See problem oriented charting  Follow Up Instructions: rx imodium and zofran, advised to go to urgent care for fluids today given dizziness  Visit time 12 minutes in non-face to face communication with patient and coordination of care.  I discussed the assessment and treatment plan with the patient. The patient was provided an opportunity to ask questions and all were answered. The patient agreed with the plan and demonstrated an understanding of the instructions.   The patient was advised to call back or seek an  in-person evaluation if the symptoms worsen or if the condition fails to improve as anticipated.  Hoyt Koch, MD

## 2020-10-28 DIAGNOSIS — R197 Diarrhea, unspecified: Secondary | ICD-10-CM | POA: Insufficient documentation

## 2020-10-28 NOTE — Assessment & Plan Note (Signed)
Advised to go to urgent care for fluids given dizziness and her inability today to drink much fluids. Rx zofran and imodium in case she decides not to seek further care and instructed on BRAT diet.

## 2020-12-14 ENCOUNTER — Emergency Department (HOSPITAL_COMMUNITY): Payer: BC Managed Care – PPO

## 2020-12-14 ENCOUNTER — Emergency Department (HOSPITAL_COMMUNITY)
Admission: EM | Admit: 2020-12-14 | Discharge: 2020-12-14 | Disposition: A | Discharge status: Home / Self Care | Payer: BC Managed Care – PPO | Attending: Emergency Medicine | Admitting: Emergency Medicine

## 2020-12-14 ENCOUNTER — Other Ambulatory Visit: Payer: Self-pay

## 2020-12-14 ENCOUNTER — Encounter (HOSPITAL_COMMUNITY): Payer: Self-pay | Admitting: Emergency Medicine

## 2020-12-14 DIAGNOSIS — J4520 Mild intermittent asthma, uncomplicated: Secondary | ICD-10-CM | POA: Insufficient documentation

## 2020-12-14 DIAGNOSIS — Z7901 Long term (current) use of anticoagulants: Secondary | ICD-10-CM | POA: Diagnosis not present

## 2020-12-14 DIAGNOSIS — R55 Syncope and collapse: Secondary | ICD-10-CM | POA: Diagnosis not present

## 2020-12-14 LAB — CBC WITH DIFFERENTIAL/PLATELET
Abs Immature Granulocytes: 0.01 10*3/uL (ref 0.00–0.07)
Basophils Absolute: 0.1 10*3/uL (ref 0.0–0.1)
Basophils Relative: 1 %
Eosinophils Absolute: 0.1 10*3/uL (ref 0.0–0.5)
Eosinophils Relative: 2 %
HCT: 42.6 % (ref 36.0–46.0)
Hemoglobin: 14.7 g/dL (ref 12.0–15.0)
Immature Granulocytes: 0 %
Lymphocytes Relative: 33 %
Lymphs Abs: 1.6 10*3/uL (ref 0.7–4.0)
MCH: 29.6 pg (ref 26.0–34.0)
MCHC: 34.5 g/dL (ref 30.0–36.0)
MCV: 85.9 fL (ref 80.0–100.0)
Monocytes Absolute: 0.7 10*3/uL (ref 0.1–1.0)
Monocytes Relative: 13 %
Neutro Abs: 2.5 10*3/uL (ref 1.7–7.7)
Neutrophils Relative %: 51 %
Platelets: 237 10*3/uL (ref 150–400)
RBC: 4.96 MIL/uL (ref 3.87–5.11)
RDW: 13.8 % (ref 11.5–15.5)
WBC: 5 10*3/uL (ref 4.0–10.5)
nRBC: 0 % (ref 0.0–0.2)

## 2020-12-14 LAB — BASIC METABOLIC PANEL
Anion gap: 5 (ref 5–15)
BUN: 19 mg/dL (ref 6–20)
CO2: 27 mmol/L (ref 22–32)
Calcium: 9.7 mg/dL (ref 8.9–10.3)
Chloride: 106 mmol/L (ref 98–111)
Creatinine, Ser: 0.85 mg/dL (ref 0.44–1.00)
GFR, Estimated: >60 mL/min (ref >60)
Glucose, Bld: 141 mg/dL — ABNORMAL HIGH (ref 70–99)
Potassium: 3.5 mmol/L (ref 3.5–5.1)
Sodium: 138 mmol/L (ref 135–145)

## 2020-12-14 IMAGING — CT CT HEAD W/O CM
3 series · 14 of 47 positions shown, 16 images · non-contrast
Comparison: [DATE]

CLINICAL DATA: Syncope.  Dizziness.  Did not hit head.

EXAM:
CT HEAD WITHOUT CONTRAST
TECHNIQUE: Contiguous axial images were obtained from the base of the skull
through the vertex without intravenous contrast.

[Series 2: head wo · axial · 0.47mm/px · z∈[+1336,+1466]mm · 8 of 32 slices shown, 10 images]
[im 3/32  brain]
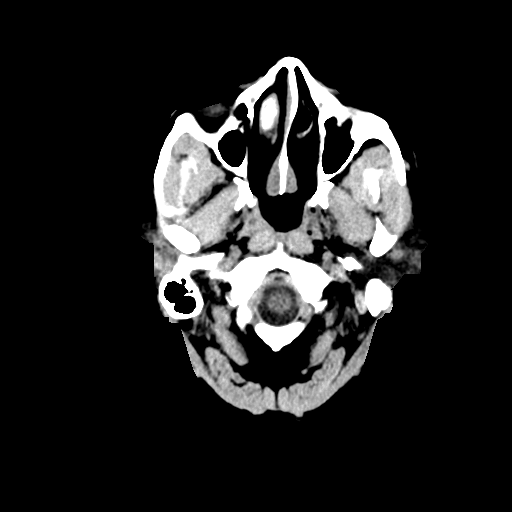
[im 3/32  bone]
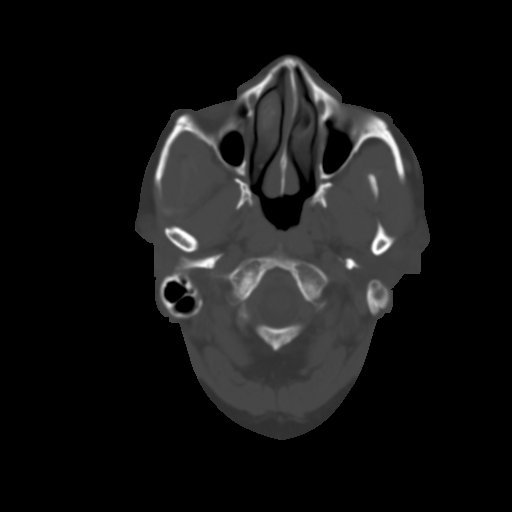
[im 7/32  brain]
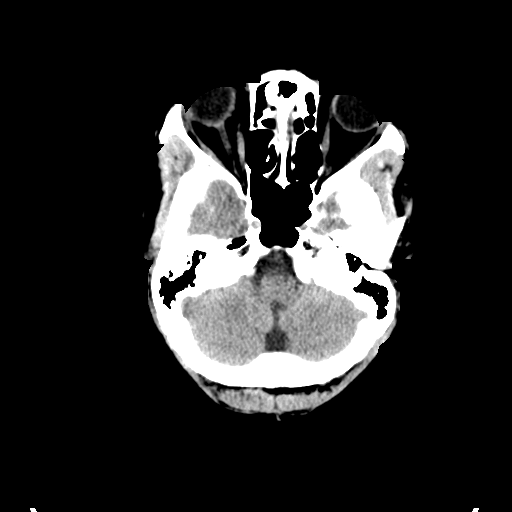
[im 10/32  brain]
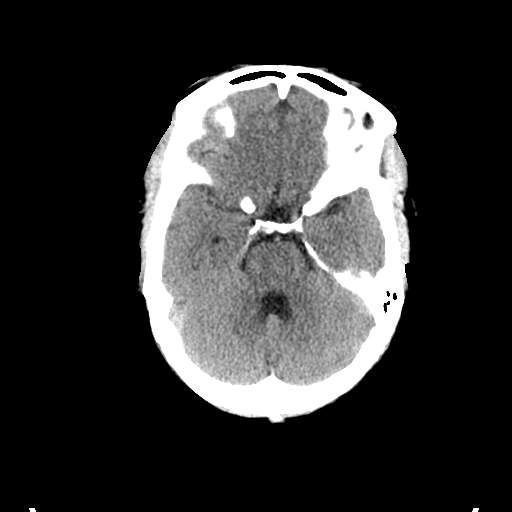
[im 14/32  brain]
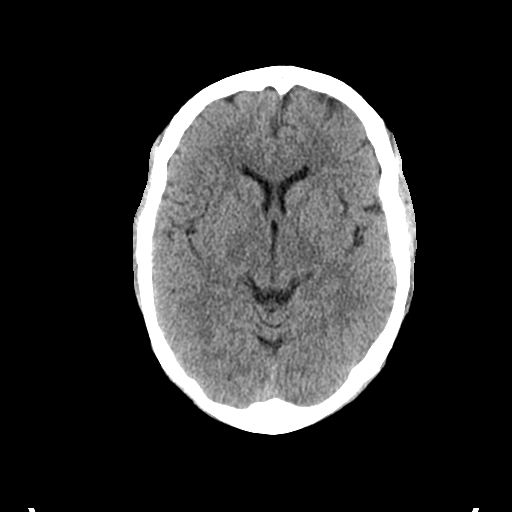
[im 18/32  brain]
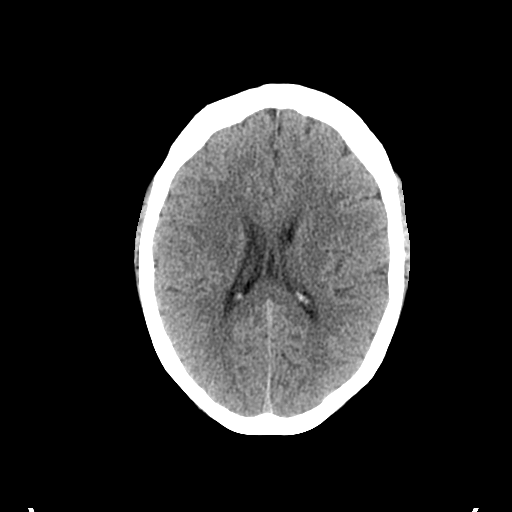
[im 18/32  bone]
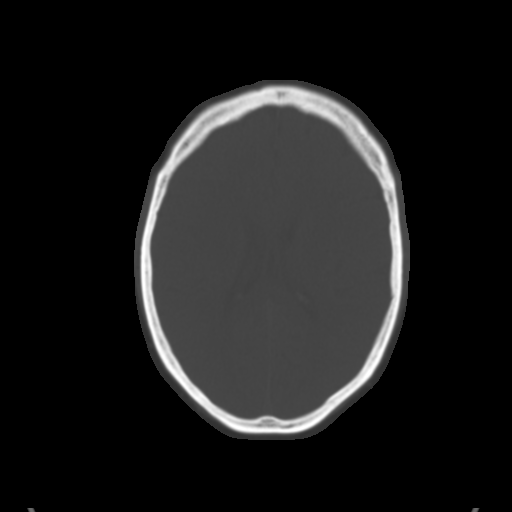
[im 22/32  brain]
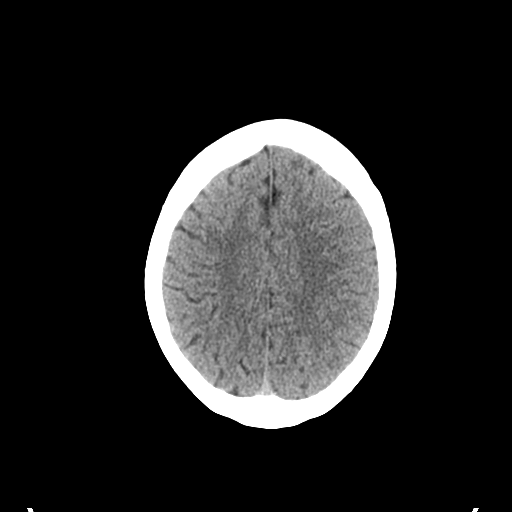
[im 25/32  brain]
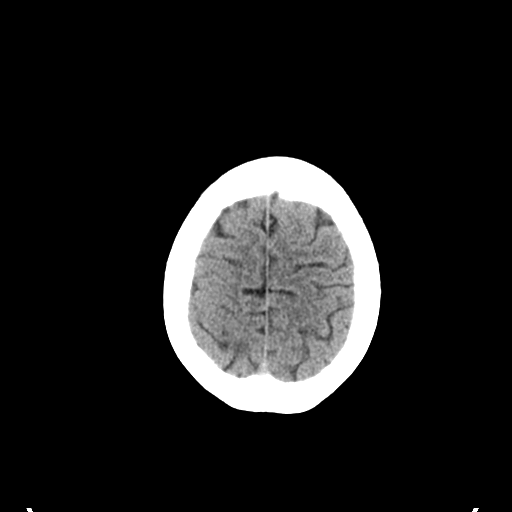
[im 29/32  brain]
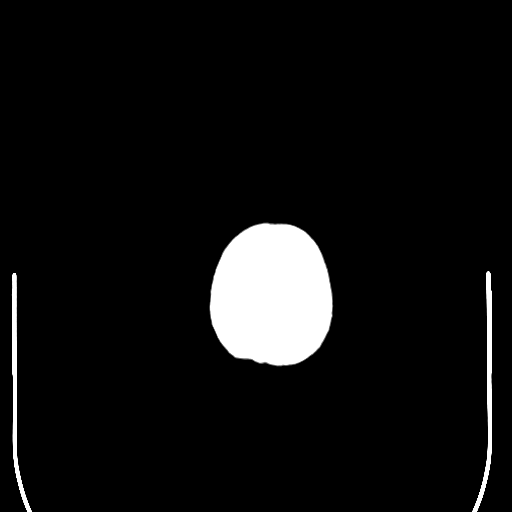

[Series 5: coronal soft tissue · coronal · 0.33mm/px · 3 of 66 slices shown]
[im 22/66  brain]
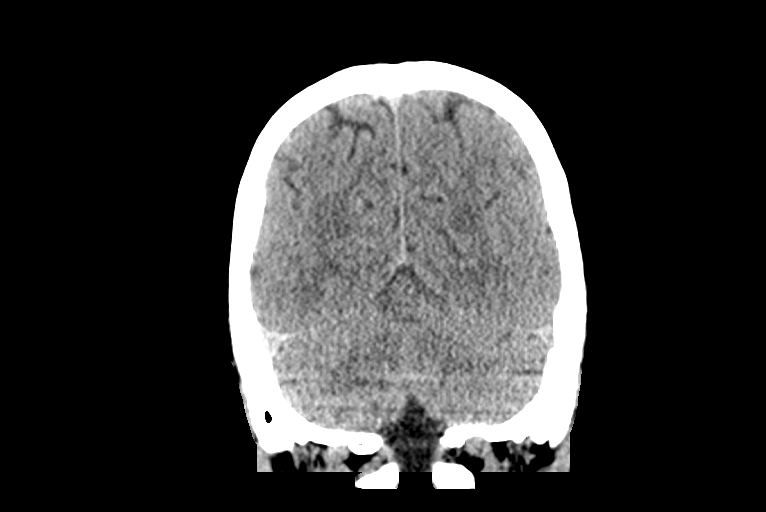
[im 29/66  brain]
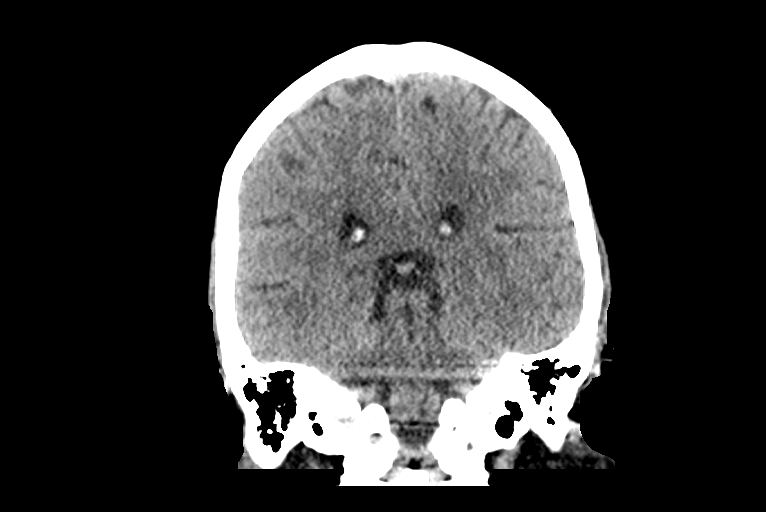
[im 37/66  brain]
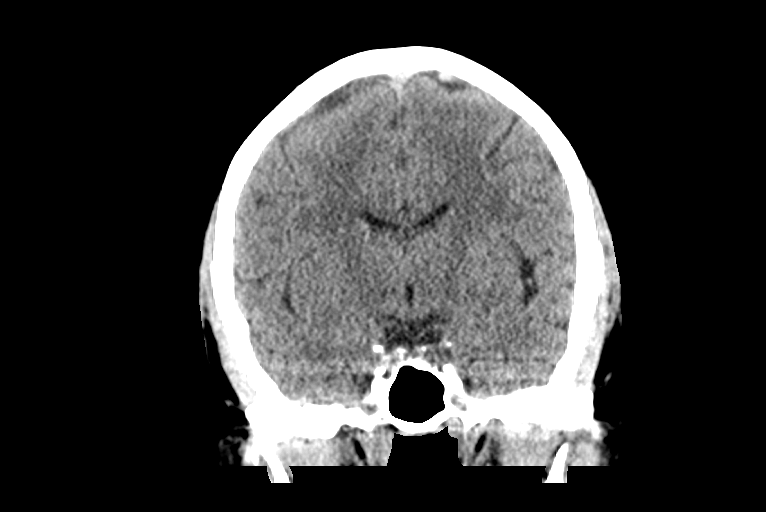

[Series 6: sagittal soft tissue · sagittal · 0.33mm/px · 3 of 54 slices shown]
[im 18/54  brain]
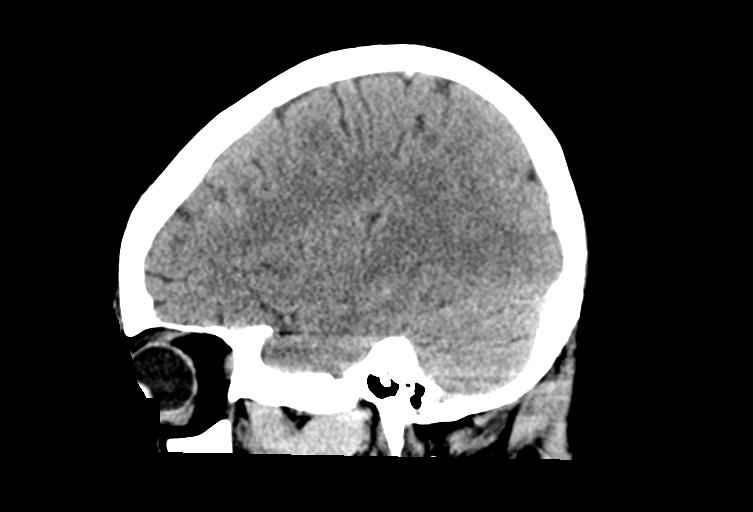
[im 27/54  brain]
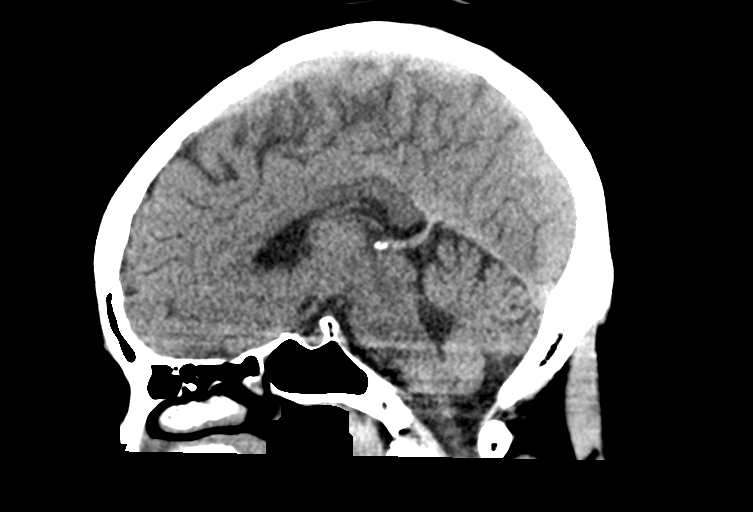
[im 36/54  brain]
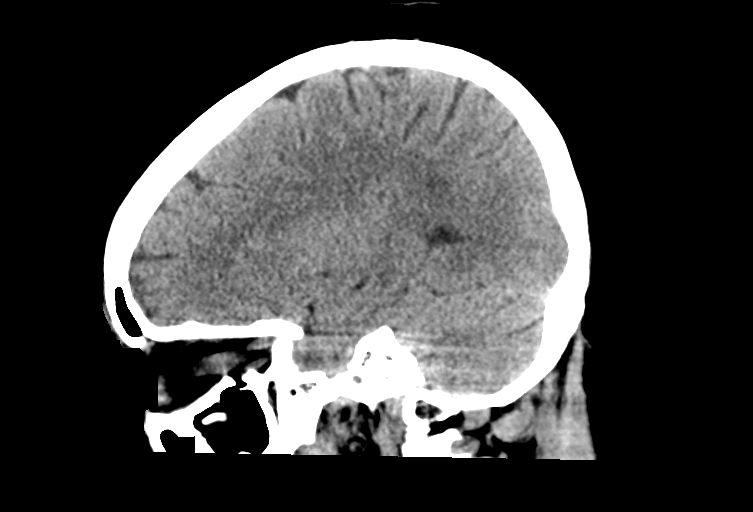

[14 of 47 positions shown; findings below may reference images not displayed]

FINDINGS: Brain: No evidence of acute infarction, hemorrhage, hydrocephalus,
extra-axial collection or mass lesion/mass effect.

Vascular: No hyperdense vessel or unexpected calcification.

Skull: Normal. Negative for fracture or focal lesion.

Sinuses/Orbits: No acute finding.

Other: None.
IMPRESSION: No evidence for acute intracranial abnormality.

## 2020-12-14 MED ORDER — SODIUM CHLORIDE 0.9 % IV BOLUS
500.0000 mL | Freq: Once | INTRAVENOUS | Status: AC
  Administered 2020-12-14: 500 mL via INTRAVENOUS

## 2020-12-14 MED ORDER — ACETAMINOPHEN 500 MG PO TABS
1000.0000 mg | ORAL_TABLET | Freq: Once | ORAL | Status: AC
  Administered 2020-12-14: 1000 mg via ORAL
  Filled 2020-12-14: qty 2

## 2020-12-14 NOTE — ED Provider Notes (Signed)
[facility] DEPT Provider Note   CSN: [id number] Arrival date & time: 12/14/20  1453     History Chief Complaint  Patient presents with  . Loss of Consciousness    Danielly JENEFER WOERNER is a 50 y.o. female.  50 year old female with prior medical history as detailed below presents for evaluation.  Patient reports that she was working outside.  She became overheated.  She then may have passed out.  She cannot recall for sure whether she actually had a true syncope.  She complains of mild headache.  She appears to be tearful and upset.  She denies associated chest pain or palpitations.  She denies shortness of breath.   The history is provided by the patient and medical records.  Illness Location:  Overheated, near syncope Severity:  Mild Onset quality:  Sudden Duration:  1 hour Timing:  Rare Progression:  Partially resolved Chronicity:  New      Past Medical History:  Diagnosis Date  . CTEPH (chronic thromboembolic pulmonary hypertension) ([place])   . DVT (deep venous thrombosis) ([place])   . Hepatitis B infection    Hep B core Ab 04/2005  . Primary hypercoagulable state ([place])   . Pulmonary embolism ([place]) 2005      . Pulmonary embolism ([place])   . Seasonal allergies     Patient Active Problem List   Diagnosis Date Noted  . Diarrhea 10/28/2020  . Syncope 12/15/2018  . Fall 12/15/2018  . Chronic midline low back pain 08/17/2018  . Mild intermittent asthma without complication 40/98/1191  . Iron deficiency anemia 12/18/2015  . Lower extremity pain, bilateral 11/17/2015  . Prediabetes 06/30/2015  . Allergic rhinitis 03/11/2012  . Antiphospholipid syndrome ([place]) 05/24/2011  . Hepatitis B infection   . CTEPH (chronic thromboembolic pulmonary hypertension) ([place]) 10/05/2010  . Long term current use of anticoagulant 10/05/2010  . Preventative health care 09/28/2010    Past Surgical History:  Procedure Laterality Date  . CARDIAC CATHETERIZATION   11/2005   R heart cath : Severe pulmonary arterial HTN with evidence of cor pulmonale. Vasodilatory challenge not performed.  . Pulmonary artery endarterectomy  2007  . VENA CAVA FILTER PLACEMENT       OB History    Gravida  4   Para  3   Term  3   Preterm  0   AB  1   Living        SAB  1   IAB  0   Ectopic  0   Multiple      Live Births              Family History  Problem Relation Age of Onset  . Hypertension Mother   . Hypertension Father        deceased  . Alcohol abuse Neg Hx   . Cancer Neg Hx   . Diabetes Neg Hx   . Early death Neg Hx   . Heart disease Neg Hx   . Hyperlipidemia Neg Hx   . Kidney disease Neg Hx   . Stroke Neg Hx     Social History   Tobacco Use  . Smoking status: Never Smoker  . Smokeless tobacco: Never Used  Vaping Use  . Vaping Use: Never used  Substance Use Topics  . Alcohol use: No    Alcohol/week: 0.0 standard drinks  . Drug use: No    Home Medications Prior to Admission medications   Medication Sig Start Date End Date Taking?  Authorizing Provider  acetaminophen (TYLENOL) 500 MG tablet Take 500 mg by mouth every 6 (six) hours as needed for headache (pain).    [provider]  albuterol (VENTOLIN HFA) 108 (90 Base) MCG/ACT inhaler Inhale 1-2 puffs into the lungs every 6 (six) hours as needed for wheezing or shortness of breath. 08/17/18   Janith Lima, MD  loperamide (IMODIUM A-D) 2 MG tablet Take 1 tablet (2 mg total) by mouth 4 (four) times daily as needed for diarrhea or loose stools. 10/27/20   Hoyt Koch, MD  Multiple Vitamins-Minerals (CENTRUM ADULTS) TABS Take by mouth.    [provider]  ondansetron (ZOFRAN ODT) 4 MG disintegrating tablet Take 1 tablet (4 mg total) by mouth every 8 (eight) hours as needed for nausea or vomiting. 10/27/20   Hoyt Koch, MD  rivaroxaban (XARELTO) 20 MG TABS tablet TAKE 1 TABLET BY MOUTH ONCE DAILY IN THE MORNING 12/19/19   Chesley Mires, MD     Allergies    Aspirin and Aspirin  Review of Systems   Review of Systems  All other systems reviewed and are negative.   Physical Exam Updated Vital Signs BP 136/81 (BP Location: Right Arm)   Pulse 87   Temp 98.6 F (37 C) (Oral)   Resp 18   SpO2 97%   Physical Exam Vitals and nursing note reviewed.  Constitutional:      General: She is not in acute distress.    Appearance: Normal appearance. She is well-developed.  HENT:     Head: Normocephalic and atraumatic.  Eyes:     Conjunctiva/sclera: Conjunctivae normal.     Pupils: Pupils are equal, round, and reactive to light.  Cardiovascular:     Rate and Rhythm: Normal rate and regular rhythm.     Heart sounds: Normal heart sounds.  Pulmonary:     Effort: Pulmonary effort is normal. No respiratory distress.     Breath sounds: Normal breath sounds.  Abdominal:     General: There is no distension.     Palpations: Abdomen is soft.     Tenderness: There is no abdominal tenderness.  Musculoskeletal:        General: No deformity. Normal range of motion.     Cervical back: Normal range of motion and neck supple.  Skin:    General: Skin is warm and dry.  Neurological:     General: No focal deficit present.     Mental Status: She is alert and oriented to person, place, and time. Mental status is at baseline.     Cranial Nerves: No cranial nerve deficit.     Sensory: No sensory deficit.     Motor: No weakness.     Coordination: Coordination normal.     ED Results / Procedures / Treatments   Labs (all labs ordered are listed, but only abnormal results are displayed) Labs Reviewed  CBC WITH DIFFERENTIAL/PLATELET  BASIC METABOLIC PANEL    EKG None  Radiology No results found.  Procedures Procedures   Medications Ordered in ED Medications  sodium chloride 0.9 % bolus 500 mL (has no administration in time range)  acetaminophen (TYLENOL) tablet 1,000 mg (has no administration in time range)    ED Course  I  have reviewed the triage vital signs and the nursing notes.  Pertinent labs & imaging results that were available during my care of the patient were reviewed by me and considered in my medical decision making (see chart for details).  MDM Rules/Calculators/A&P                          MDM  MSE complete  Santasia T Hazel was evaluated in Emergency Department on 12/14/2020 for the symptoms described in the history of present illness. She was evaluated in the context of the global COVID-19 pandemic, which necessitated consideration that the patient might be at risk for infection with the SARS-CoV-2 virus that causes COVID-19. Institutional protocols and algorithms that pertain to the evaluation of patients at risk for COVID-19 are in a state of rapid change based on information released by regulatory bodies including the CDC and federal and state organizations. These policies and algorithms were followed during the patient's care in the ED.   Patient is presenting with reported near syncope in the setting of likely overheating while working outside.  Patient's exam is reassuring.  Screening labs are without significant abnormality.  CT imaging was without significant abnormality.  Patient feels improved after IV fluids.  She now desires discharge home.  Importance of close follow-up was stressed to the patient and her daughter at bedside.  Strict return precautions given and understood.   Final Clinical Impression(s) / ED Diagnoses Final diagnoses:  Near syncope    Rx / DC Orders ED Discharge Orders    None       Valarie Merino, MD 12/14/20 [phone]

## 2020-12-14 NOTE — ED Triage Notes (Addendum)
Per EMS, patient from work, syncopal episode witnessed by coworkers, hx of same. Lowered herself to the ground after "feeling hot." Hx vertigo. Takes Xarelto. Did not fall or hit her head.  Patient adds she has had headache today.

## 2020-12-14 NOTE — Discharge Instructions (Signed)
Return for any problem.  ?

## 2020-12-22 ENCOUNTER — Other Ambulatory Visit: Payer: Self-pay

## 2020-12-22 ENCOUNTER — Ambulatory Visit (INDEPENDENT_AMBULATORY_CARE_PROVIDER_SITE_OTHER): Payer: BC Managed Care – PPO | Admitting: Pulmonary Disease

## 2020-12-22 ENCOUNTER — Encounter: Payer: Self-pay | Admitting: Pulmonary Disease

## 2020-12-22 VITALS — BP 120/70 | HR 82 | Temp 97.2°F | Ht 64.0 in | Wt 198.2 lb

## 2020-12-22 DIAGNOSIS — R55 Syncope and collapse: Secondary | ICD-10-CM | POA: Diagnosis not present

## 2020-12-22 DIAGNOSIS — Z7184 Encounter for health counseling related to travel: Secondary | ICD-10-CM | POA: Diagnosis not present

## 2020-12-22 DIAGNOSIS — Z0000 Encounter for general adult medical examination without abnormal findings: Secondary | ICD-10-CM | POA: Diagnosis not present

## 2020-12-22 MED ORDER — RIVAROXABAN 20 MG PO TABS: ORAL_TABLET | ORAL | 3 refills | Status: DC

## 2020-12-22 NOTE — Progress Notes (Signed)
[facility]  Chief Complaint  Patient presents with  . Follow-up    Increased sob x 1 wk., headache    Constitutional:  BP 120/70 (BP Location: Right Arm, Cuff Size: Normal)   Pulse 82   Temp (!) 97.2 F (36.2 C) (Temporal)   Ht 5' 4"  (1.626 m)   Wt 198 lb 3.2 oz (89.9 kg)   SpO2 100%   BMI 34.02 kg/m   Past Medical History:  Hepatitis B, Season allergies  Past Surgical History:  She  has a past surgical history that includes Pulmonary artery endarterectomy (2007); Cardiac catheterization (11/2005); and Vena cava filter placement.  Brief Summary:  [name] is a 50 y.o. female with recurrent PE/DVT and chronic thromboembolic pulmonary hypertension on life long anticoagulation. She had pulmonary endarterectomy at [facility] in 2007.      Subjective:   She was in ER earlier this month with feeling dizzy while at work.  CT head was normal.  BMET and CBC were normal except for mildly elevated blood sugar.  Feeling better now.  She works in a hot environment, and thinks she got dehydrated.  Not having bruising or bleeding.  Denies chest pain, cough, sputum, dyspnea, or leg swelling.  Her mother passed away recently and she will be returning to [place] in July 2022.  Physical Exam:   Appearance - well kempt   ENMT - no sinus tenderness, no oral exudate, no LAN, Mallampati 2 airway, no stridor  Respiratory - equal breath sounds bilaterally, no wheezing or rales  CV - s1s2 regular rate and rhythm, no murmurs  Ext - no clubbing, no edema  Skin - no rashes  Psych - normal mood and affect   Pulmonary testing:   PFT 11/14/12 >> FEV1 2.52 (95%), FEV1% 92, TLC 3.75 (70%), DLCO 76%, no BD  Chest Imaging:   V/Q scan 03/18/06 >> multiple b/l large mismatched segmental perfusion defects from chronic PE  CT chest 12/14/12 >> 3 mm RUL nodule, scar LUL posterior segment  V/Q scan 06/06/14 >> very low probability for PE  CT chest  01/16/15 >> mild dilation of pulmonary trunk  Cardiac Tests:   Echo 06/11/14 >> EF 55 to 60%, mild TR and PR  Social History:  She  reports that she has never smoked. She has never used smokeless tobacco. She reports that she does not drink alcohol and does not use drugs.  Family History:  Her family history includes Hypertension in her father and mother.     Assessment/Plan:   Chronic thromboembolic pulmonary hypertension. - she is on lifelong anticoagulation - 3 month refill for xarelto sent  Travel to [place]. - will send script for malaria prophylaxis after reviewing latest CDC recommendations  Pre-syncope. - seems like she was volume depleted while working in a hot environment - encouraged her to maintain adequate water intake  Health maintenance examination. - she was not able to see her previous gynecologist for several years due to [place] pandemic; as such she viewed as a new patient and her previous gynecologist was no longer accepting new patients - she needs referral to new gynecologist  Time Spent Involved in Patient Care on Day of Examination:  33 minutes  Follow up:  Patient Instructions  Will arrange for referral to Gynecology for health maintenance examination  Will call to send in scripts for trip to [place] after reviewing latest CDC recommendations  Follow up in 1 year   Medication List:  Allergies as of 12/22/2020      Reactions   Aspirin Other (See Comments)   Other Reaction: GI Upset Other Reaction: GI Upset Since being in [place], told she has an ulcer   Aspirin Other (See Comments)   Gi upset/ulcer      Medication List       Accurate as of Dec 22, 2020  9:27 AM. If you have any questions, ask your nurse or doctor.        STOP taking these medications   albuterol 108 (90 Base) MCG/ACT inhaler Commonly known as: Ventolin HFA Stopped by: Chesley Mires, MD   loperamide 2 MG tablet Commonly known as: Imodium A-D Stopped by: Chesley Mires,  MD   ondansetron 4 MG disintegrating tablet Commonly known as: Zofran ODT Stopped by: Chesley Mires, MD     TAKE these medications   acetaminophen 500 MG tablet Commonly known as: TYLENOL Take 500 mg by mouth every 6 (six) hours as needed for headache (pain).   Centrum Adults Tabs Take by mouth.   rivaroxaban 20 MG Tabs tablet Commonly known as: Xarelto TAKE 1 TABLET BY MOUTH ONCE DAILY IN THE MORNING       Signature:  Chesley Mires, MD [place] Pager - [phone] 12/22/2020, 9:27 AM

## 2020-12-22 NOTE — Patient Instructions (Signed)
Will arrange for referral to Gynecology for health maintenance examination  Will call to send in scripts for trip to [place] after reviewing latest CDC recommendations  Follow up in 1 year

## 2021-01-21 ENCOUNTER — Telehealth: Payer: Self-pay | Admitting: Pulmonary Disease

## 2021-01-21 NOTE — Telephone Encounter (Signed)
Called and spoke with pt letting her know the info stated by [facility] and she verbalized understanding. Appt has been scheduled for pt with [facility] Fri 6/24. Nothing further needed.

## 2021-01-21 NOTE — Telephone Encounter (Signed)
Pt states that she is not feeling well and has been coughing and she said that she has blood clots on her lungs and she said that she is feeling very tired while walking and stated that she is not able to sleep well throughout the night. Pt states that she does feel congested and a heavy chest. Pharmacy; [place], [address] regard; [phone]

## 2021-01-21 NOTE — Telephone Encounter (Signed)
Needs first available visit in office to sort out. If symptoms worsen should present to UC/ED.

## 2021-01-21 NOTE — Telephone Encounter (Signed)
Primary Pulmonologist: [facility] Last office visit and with whom: 12/22/20 with Halford Chessman What do we see them for (pulmonary problems): CTEPH Last OV assessment/plan:  Assessment/Plan:    Chronic thromboembolic pulmonary hypertension. - she is on lifelong anticoagulation - 3 month refill for xarelto sent   Travel to [place]. - will send script for malaria prophylaxis after reviewing latest CDC recommendations   Pre-syncope. - seems like she was volume depleted while working in a hot environment - encouraged her to maintain adequate water intake   Health maintenance examination. - she was not able to see her previous gynecologist for several years due to [place] pandemic; as such she viewed as a new patient and her previous gynecologist was no longer accepting new patients - she needs referral to new gynecologist     Follow up:   Patient Instructions  Will arrange for referral to Gynecology for health maintenance examination   Will call to send in scripts for trip to [place] after reviewing latest CDC recommendations   Follow up in 1 year  Was appointment offered to patient (explain)?  Pt wants recommendations   Reason for call: Called and spoke with pt who states that she started not feeling well about 2 weeks ago. States that she has had increased fatigue, chest congestion, and a cough that is nonproductive.  Pt said that she did take a home covid test about a week ago which was negative.  Pt has checked her temp, last time being 3 days ago which was 98.0  Pt said that she did take tylenol last night 6/21 to see if it might help her feel better.  Pt said due to her cough, she is not sleeping well throughout the night due to coughing and congestion.  Pt wants to know what we can recommend to help with her symptoms. Sending to provider of day due to VS not being avail.  Beth, please advise.  Allergies  Allergen Reactions   Aspirin Other (See Comments)    Other Reaction: GI Upset Other  Reaction: GI Upset Since being in [place], told she has an ulcer   Aspirin Other (See Comments)    Gi upset/ulcer    Immunization History  Administered Date(s) Administered   Influenza Split 08/03/2011, 06/26/2012, 05/09/2013   Influenza Whole 09/28/2010   Influenza,inj,Quad PF,6+ Mos 06/05/2014, 06/30/2015, 05/05/2016, 08/17/2018   PFIZER(Purple Top)SARS-COV-2 Vaccination 11/02/2019, 11/30/2019   PPD Test 03/17/2020   Pneumococcal Polysaccharide-23 05/25/2011, 08/25/2018   Tdap 06/26/2012

## 2021-01-23 ENCOUNTER — Encounter: Payer: Self-pay | Admitting: Primary Care

## 2021-01-23 ENCOUNTER — Other Ambulatory Visit: Payer: Self-pay

## 2021-01-23 ENCOUNTER — Ambulatory Visit (INDEPENDENT_AMBULATORY_CARE_PROVIDER_SITE_OTHER): Payer: BC Managed Care – PPO

## 2021-01-23 ENCOUNTER — Ambulatory Visit (INDEPENDENT_AMBULATORY_CARE_PROVIDER_SITE_OTHER): Payer: BC Managed Care – PPO | Admitting: Primary Care

## 2021-01-23 VITALS — BP 110/60 | HR 94 | Temp 98.4°F | Ht 64.0 in | Wt 195.4 lb

## 2021-01-23 DIAGNOSIS — I2724 Chronic thromboembolic pulmonary hypertension: Secondary | ICD-10-CM

## 2021-01-23 DIAGNOSIS — R059 Cough, unspecified: Secondary | ICD-10-CM

## 2021-01-23 DIAGNOSIS — J209 Acute bronchitis, unspecified: Secondary | ICD-10-CM

## 2021-01-23 IMAGING — DX DG CHEST 2V
2 series · 2 of 2 positions shown · non-contrast
Comparison: [DATE]

CLINICAL DATA: Chest congestion and cough

EXAM:
CHEST - 2 VIEW

[chest pa]
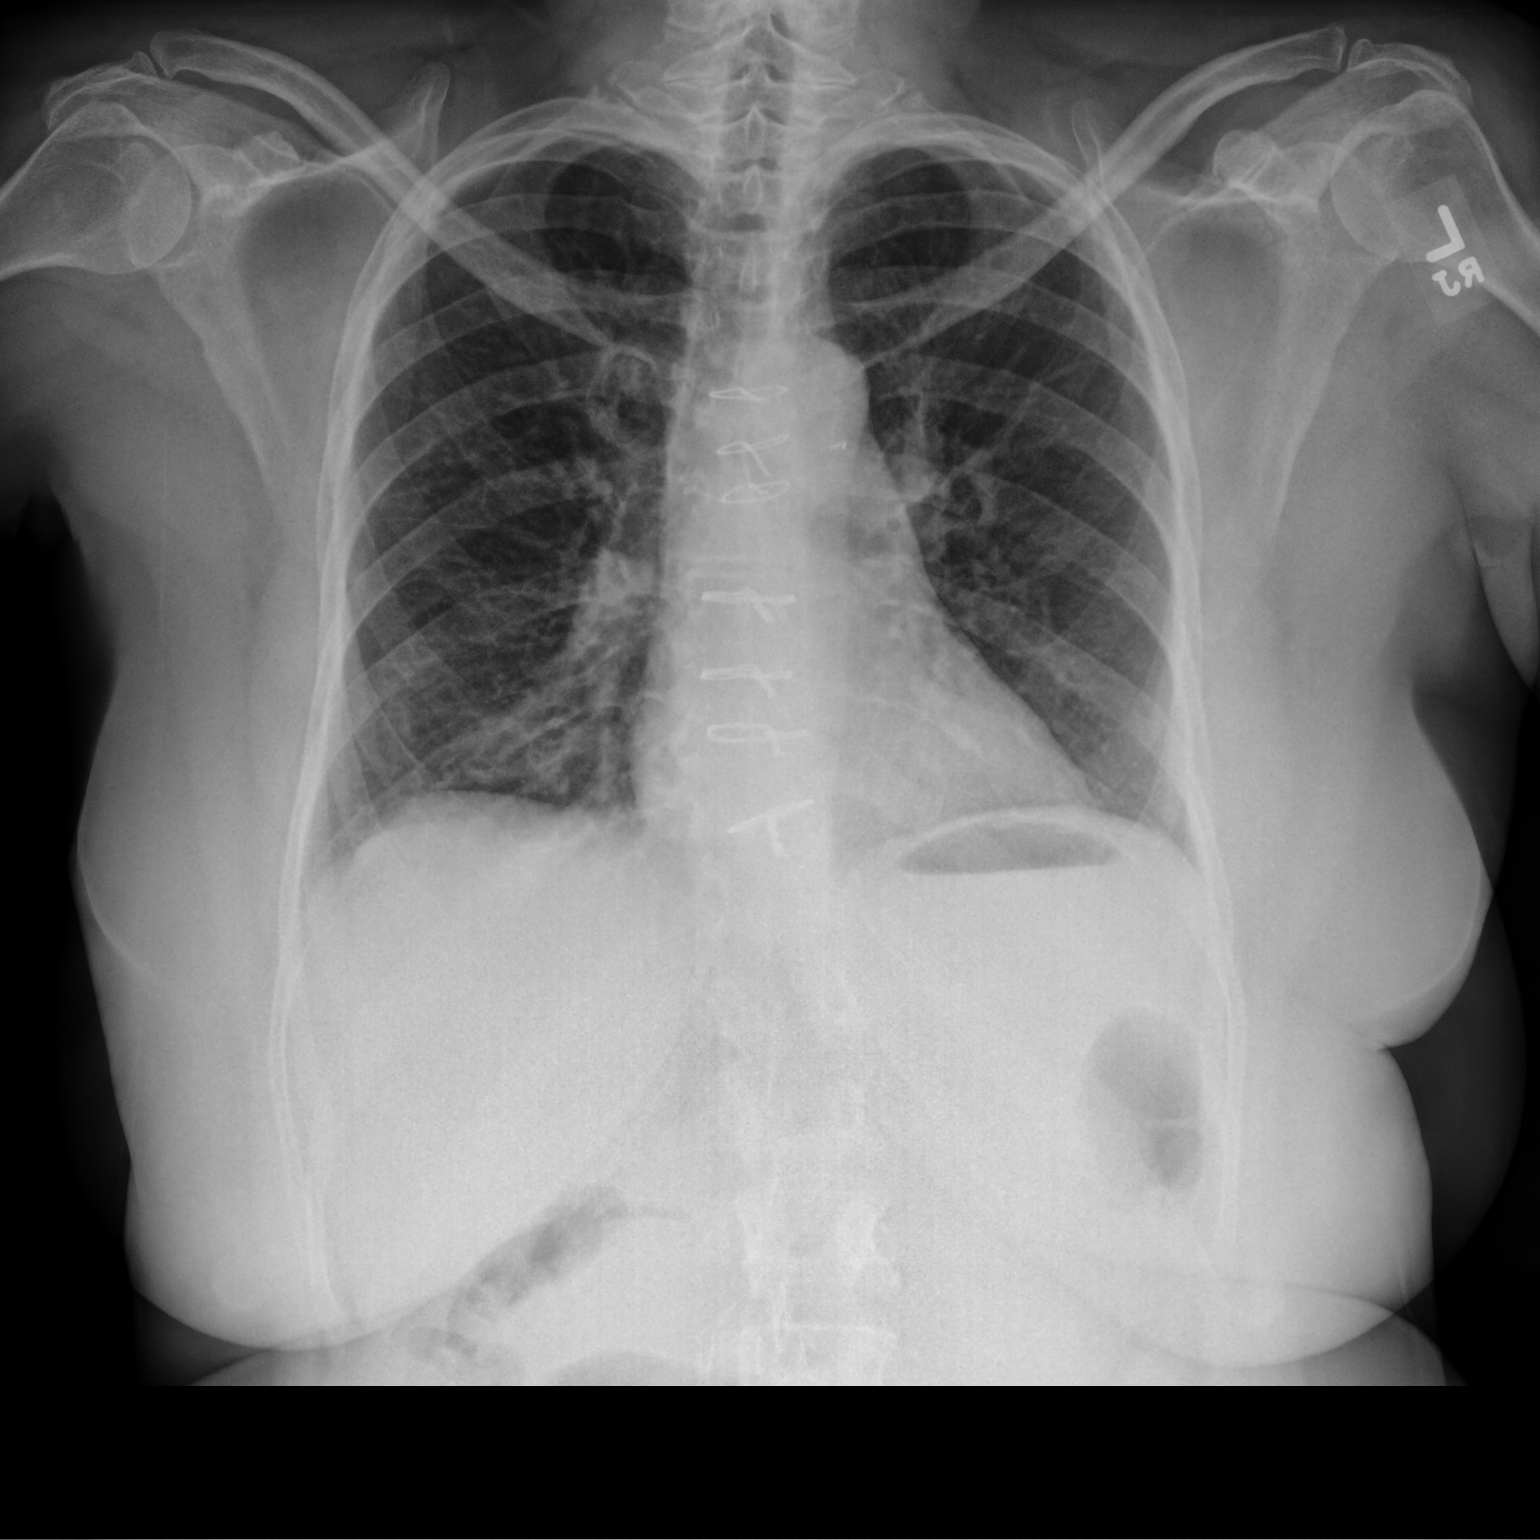

[chest lat]
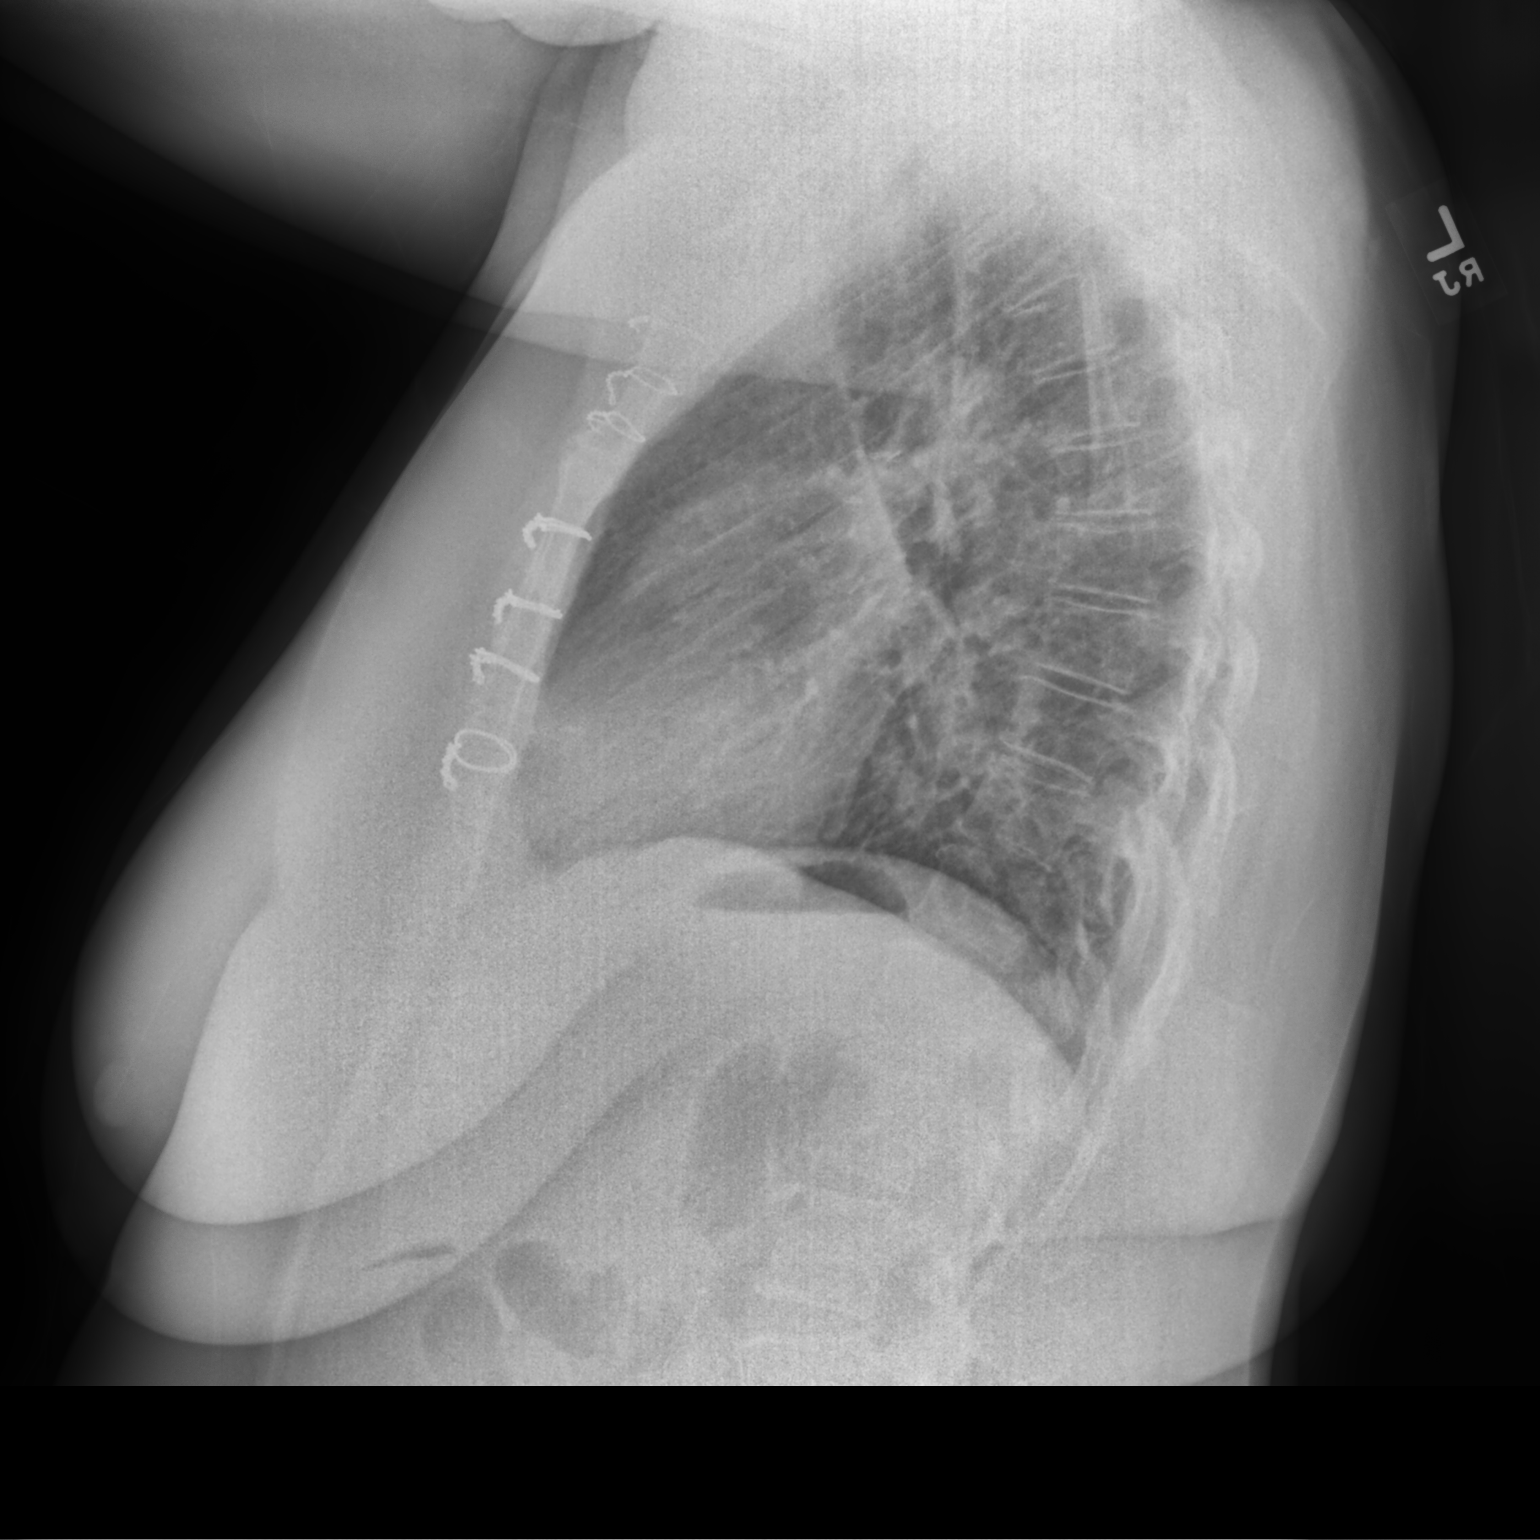

[2 of 2 positions shown; findings below may reference images not displayed]

FINDINGS: Post sternotomy changes. No focal opacity or pleural effusion.
Normal cardiomediastinal silhouette. No pneumothorax.
IMPRESSION: No active cardiopulmonary disease.

## 2021-01-23 MED ORDER — DOXYCYCLINE HYCLATE 100 MG PO TABS: 100.0000 mg | ORAL_TABLET | Freq: Two times a day (BID) | ORAL | 0 refills | Status: DC

## 2021-01-23 MED ORDER — PROMETHAZINE-DM 6.25-15 MG/5ML PO SYRP: 5.0000 mL | ORAL_SOLUTION | Freq: Four times a day (QID) | ORAL | 0 refills | Status: DC | PRN

## 2021-01-23 NOTE — Progress Notes (Signed)
@Patient  ID: [name], female    DOB: Jan 04, 1971, 50 y.o.   MRN: [id number]  No chief complaint on file.   Referring provider: Janith Lima, MD  HPI: 50 year old female, never smoked. PMH significant for mild intermittent asthma, allergic rhinitis, chronic thromboembolic pulmonary hypertension. Patient of Dr. Halford Chessman, last seen on 12/22/20.   01/23/2021 Patient presents today for acute sick visit. She has had a dry cough for two weeks. She has assocaited headache, fatigue and weakness. She lives with her daughter and grandson. Her grandson is currently sick with pneumonia. 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 She will be going back to [place] end of July. Denies shortness of breath, chest tightness or wheezing. She remains afebrile.    Allergies  Allergen Reactions   Aspirin Other (See Comments)    Other Reaction: GI Upset Other Reaction: GI Upset Since being in [place], told she has an ulcer   Aspirin Other (See Comments)    Gi upset/ulcer    Immunization History  Administered Date(s) Administered   Influenza Split 08/03/2011, 06/26/2012, 05/09/2013   Influenza Whole 09/28/2010   Influenza,inj,Quad PF,6+ Mos 06/05/2014, 06/30/2015, 05/05/2016, 08/17/2018   PFIZER(Purple Top)SARS-COV-2 Vaccination 11/02/2019, 11/30/2019   PPD Test 03/17/2020   Pneumococcal Polysaccharide-23 05/25/2011, 08/25/2018   Tdap 06/26/2012    Past Medical History:  Diagnosis Date   CTEPH (chronic thromboembolic pulmonary hypertension) ([place])    DVT (deep venous thrombosis) (H[facility])    Hepatitis B infection    Hep B core Ab 04/2005   Primary hypercoagulable state ([place])    Pulmonary embolism ([place]) 2005       Pulmonary embolism ([facility])    Seasonal allergies     Tobacco History: Social History   Tobacco Use  Smoking Status Never  Smokeless Tobacco Never   Counseling given: Not Answered   Outpatient Medications Prior to Visit  Medication Sig Dispense Refill   acetaminophen  (TYLENOL) 500 MG tablet Take 500 mg by mouth every 6 (six) hours as needed for headache (pain).     Multiple Vitamins-Minerals (CENTRUM ADULTS) TABS Take by mouth.     rivaroxaban (XARELTO) 20 MG TABS tablet TAKE 1 TABLET BY MOUTH ONCE DAILY IN THE MORNING 90 tablet 3   No facility-administered medications prior to visit.    Review of Systems  Review of Systems  Constitutional:  Positive for fatigue.  HENT: Negative.    Respiratory:  Positive for cough. Negative for chest tightness, shortness of breath and wheezing.   Cardiovascular: Negative.   Neurological:  Positive for headaches.    Physical Exam  BP 110/60 (BP Location: Left Arm, Patient Position: Sitting, Cuff Size: Normal)   Pulse 94   Temp 98.4 F (36.9 C) (Oral)   Ht 5' 4"  (1.626 m)   Wt 195 lb 6.4 oz (88.6 kg)   SpO2 99%   BMI 33.54 kg/m  Physical Exam Constitutional:      General: She is not in acute distress.    Appearance: Normal appearance. She is ill-appearing. She is not toxic-appearing or diaphoretic.  HENT:     Head: Normocephalic and atraumatic.     Mouth/Throat:     Comments: Deferred d.t masking  Cardiovascular:     Rate and Rhythm: Normal rate and regular rhythm.  Pulmonary:     Effort: Pulmonary effort is normal.     Breath sounds: Normal breath sounds.  Musculoskeletal:        General: Normal range of motion.  Skin:  General: Skin is warm and dry.  Neurological:     General: No focal deficit present.     Mental Status: She is alert and oriented to person, place, and time. Mental status is at baseline.  Psychiatric:        Mood and Affect: Mood normal.        Behavior: Behavior normal.        Thought Content: Thought content normal.        Judgment: Judgment normal.     Lab Results:  CBC    Component Value Date/Time   WBC 5.0 12/14/2020 1523   RBC 4.96 12/14/2020 1523   HGB 14.7 12/14/2020 1523   HCT 42.6 12/14/2020 1523   PLT 237 12/14/2020 1523   MCV 85.9 12/14/2020 1523    MCH 29.6 12/14/2020 1523   MCHC 34.5 12/14/2020 1523   RDW 13.8 12/14/2020 1523   LYMPHSABS 1.6 12/14/2020 1523   MONOABS 0.7 12/14/2020 1523   EOSABS 0.1 12/14/2020 1523   BASOSABS 0.1 12/14/2020 1523    BMET    Component Value Date/Time   NA 138 12/14/2020 1523   K 3.5 12/14/2020 1523   CL 106 12/14/2020 1523   CO2 27 12/14/2020 1523   GLUCOSE 141 (H) 12/14/2020 1523   BUN 19 12/14/2020 1523   CREATININE 0.85 12/14/2020 1523   CREATININE 0.91 05/24/2011 0957   CALCIUM 9.7 12/14/2020 1523   GFRNONAA >60 12/14/2020 1523   GFRAA >60 01/27/2018 2126    BNP No results found for: BNP  ProBNP    Component Value Date/Time   PROBNP 85.4 02/28/2012 1426    Imaging: DG Chest 2 View  Result Date: 01/23/2021 CLINICAL DATA:  Chest congestion and cough EXAM: CHEST - 2 VIEW COMPARISON:  08/17/2018 FINDINGS: Post sternotomy changes. No focal opacity or pleural effusion. Normal cardiomediastinal silhouette. No pneumothorax. IMPRESSION: No active cardiopulmonary disease. Electronically Signed   By: Donavan Foil M.D.   On: 01/23/2021 16:57     Assessment & Plan:   Acute bronchitis - Appears acutely ill. She has had a dry cough x 2 weeks with associated fatigue, headache and weakness  - CXR today showed no active cardiopulmonary disease. No focal opacity or effusion - Sending in Rx Doxycyline 152m BID x 7 days and promethazine-dm [id number] hours  - Advised she call our office if symptoms do not improve or worsen    ElMartyn EhrichNP 01/23/2021

## 2021-01-23 NOTE — Assessment & Plan Note (Addendum)
-   Appears acutely ill. She has had a dry cough x 2 weeks with associated fatigue, headache and weakness  - CXR today showed no active cardiopulmonary disease. No focal opacity or effusion - Sending in Rx Doxycyline 184m BID x 7 days and promethazine-dm [id number] hours  - Advised she call our office if symptoms do not improve or worsen

## 2021-01-23 NOTE — Patient Instructions (Addendum)
CXR did not show evidence of pneumonia Treating you for acute bronchitis with antibiotic and cough suppressant  Call if symptoms do not improve or worsen

## 2021-01-23 NOTE — Assessment & Plan Note (Signed)
-   Lifelong anticoagulation, continue Xarelto 75m daily

## 2021-01-30 ENCOUNTER — Other Ambulatory Visit: Payer: Self-pay | Admitting: Internal Medicine

## 2021-01-30 DIAGNOSIS — Z1231 Encounter for screening mammogram for malignant neoplasm of breast: Secondary | ICD-10-CM

## 2021-02-03 ENCOUNTER — Ambulatory Visit
Admission: RE | Admit: 2021-02-03 | Discharge: 2021-02-03 | Disposition: A | Discharge status: Home / Self Care | Payer: BC Managed Care – PPO | Source: Ambulatory Visit | Attending: Internal Medicine | Admitting: Internal Medicine

## 2021-02-03 ENCOUNTER — Other Ambulatory Visit: Payer: Self-pay

## 2021-02-03 DIAGNOSIS — Z1231 Encounter for screening mammogram for malignant neoplasm of breast: Secondary | ICD-10-CM

## 2021-02-03 IMAGING — MG MM DIGITAL SCREENING BILAT W/ TOMO AND CAD
6 of 10 series · 6 of 30 positions shown · non-contrast
Comparison: Previous exam(s).

CLINICAL DATA: Screening.

EXAM:
DIGITAL SCREENING BILATERAL MAMMOGRAM WITH TOMOSYNTHESIS AND CAD
TECHNIQUE: Bilateral screening digital craniocaudal and mediolateral oblique
mammograms were obtained. Bilateral screening digital breast
tomosynthesis was performed. The images were evaluated with
computer-aided detection.

[L MLO synth-2D]
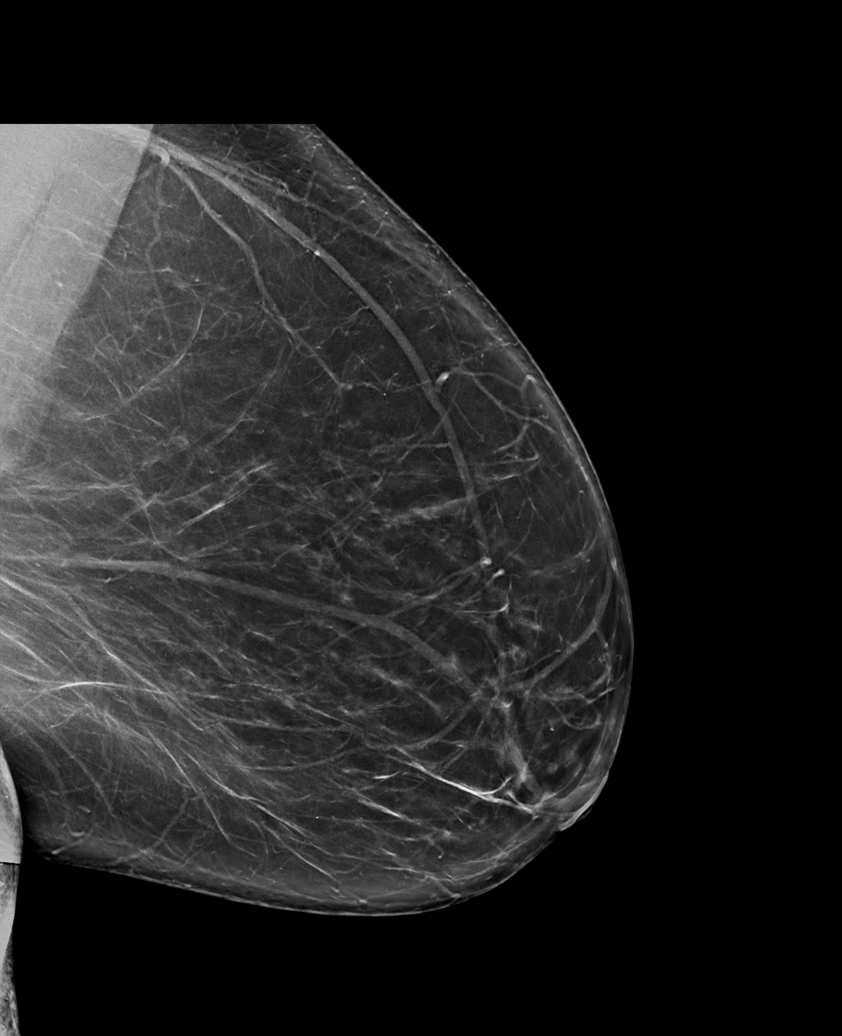

[L CC synth-2D]
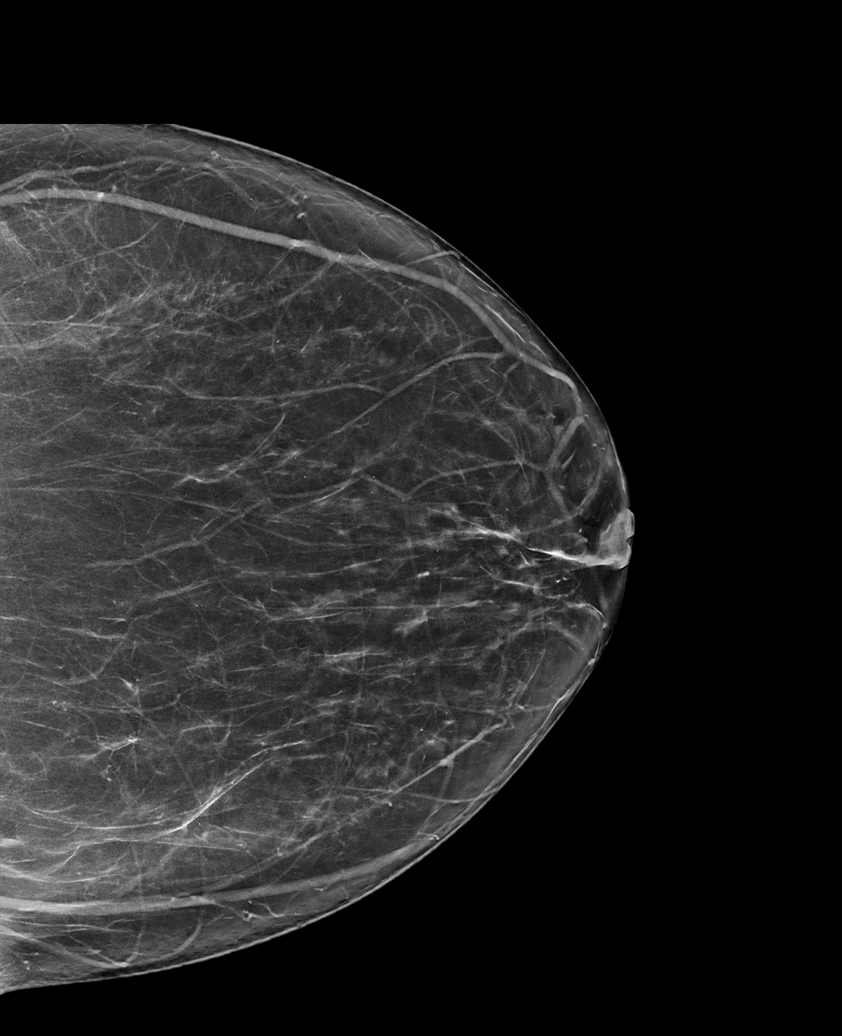

[R CC synth-2D (1 of 2)]
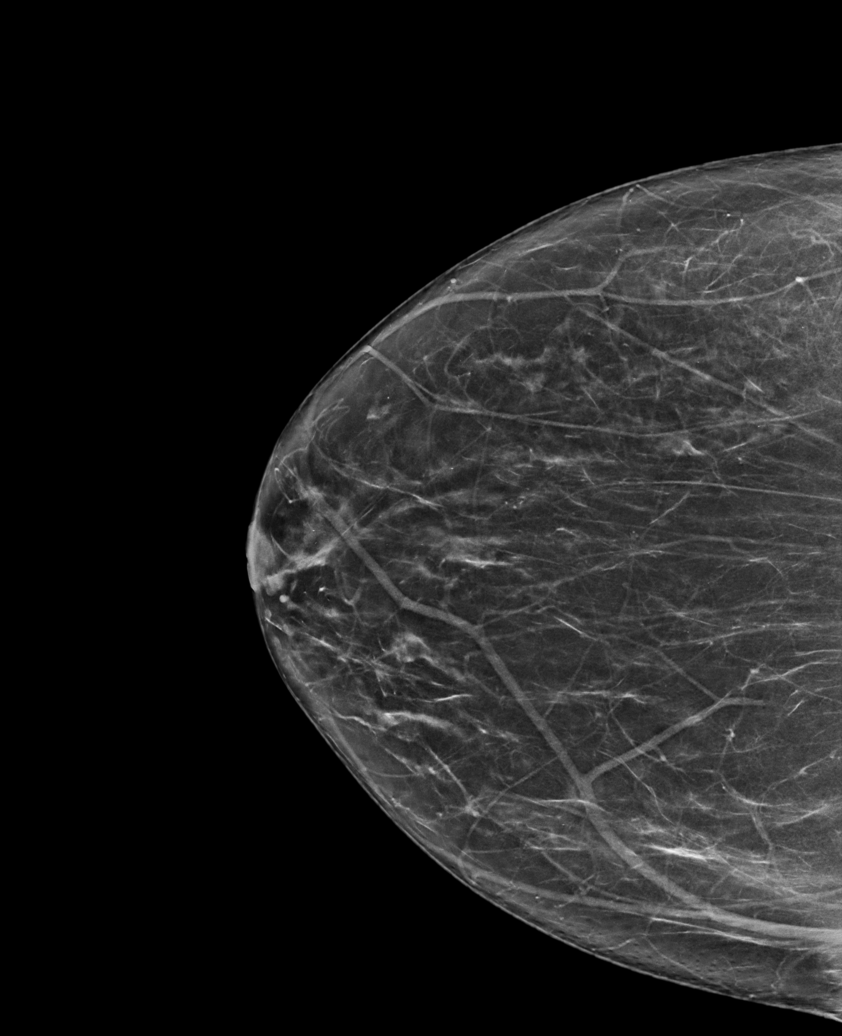

[R CC synth-2D (2 of 2)]
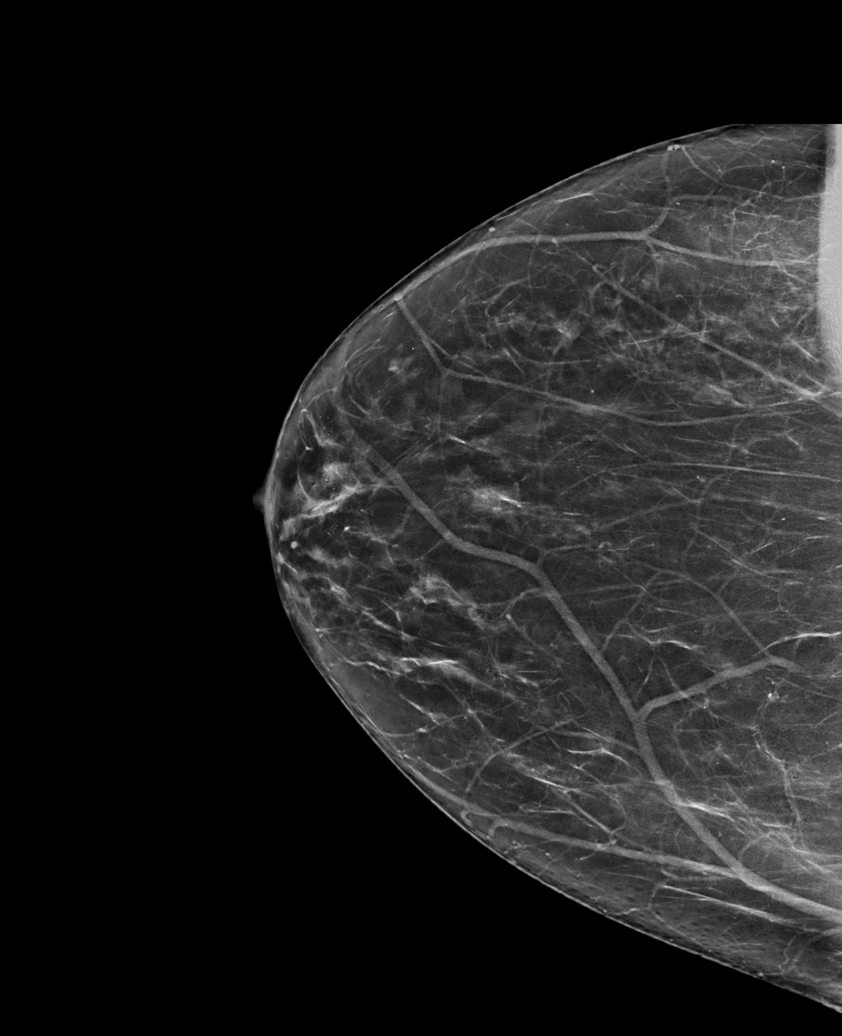

[R MLO synth-2D]
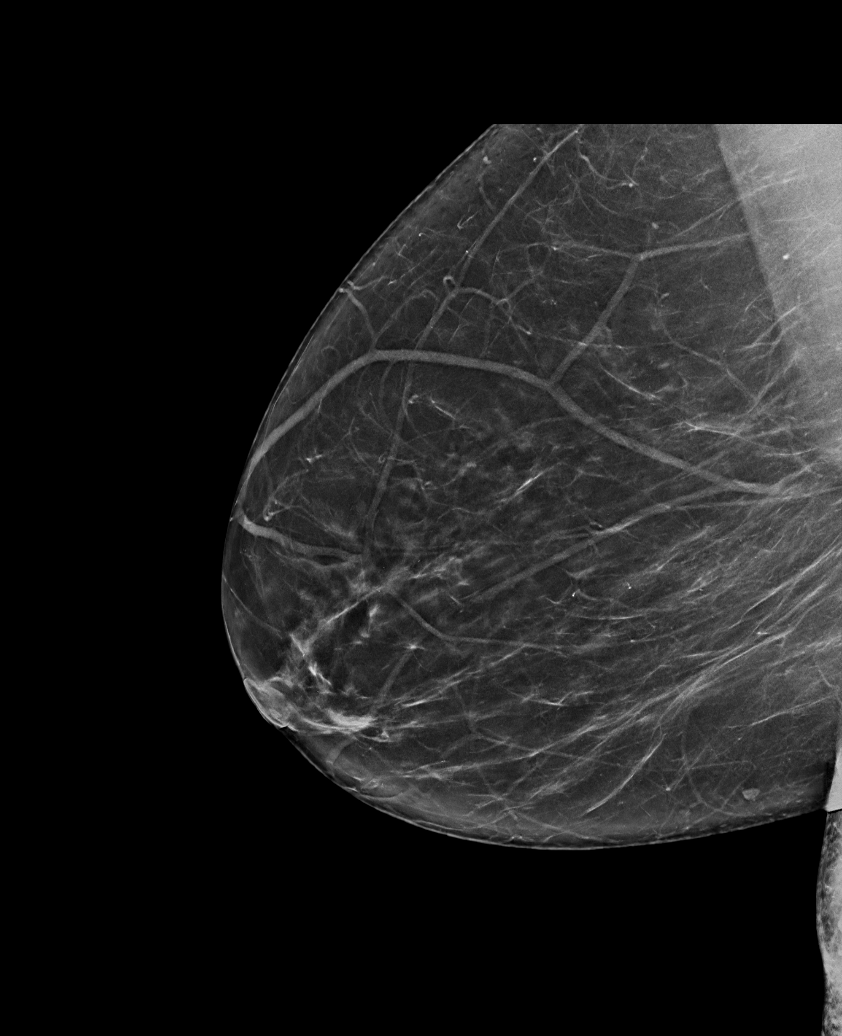

[L CC tomo · tomo slice 38/75.0]
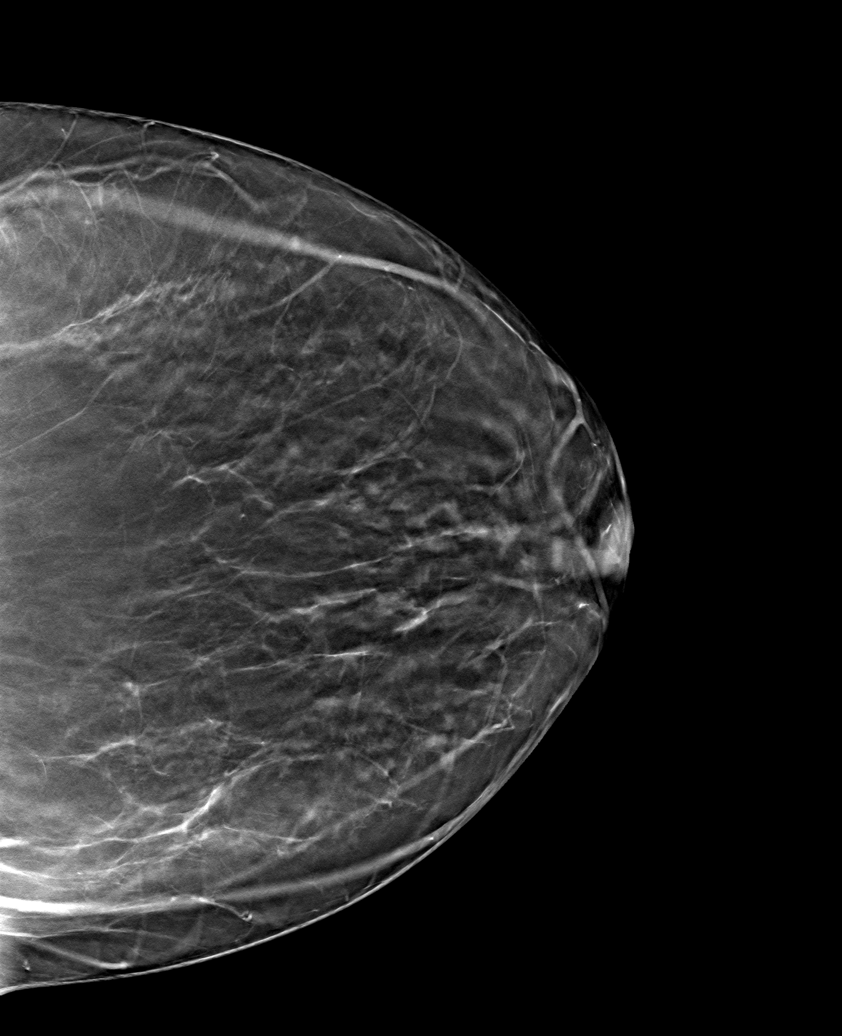

[6 of 30 positions shown; findings below may reference images not displayed]

ACR Breast Density Category b: There are scattered areas of
fibroglandular density.
FINDINGS: There are no findings suspicious for malignancy.
IMPRESSION: No mammographic evidence of malignancy. A result letter of this
screening mammogram will be mailed directly to the patient.

RECOMMENDATION:
Screening mammogram in one year. (Code:[BY])

BI-RADS CATEGORY  1: Negative.

## 2021-02-24 ENCOUNTER — Ambulatory Visit: Payer: BC Managed Care – PPO | Admitting: Internal Medicine

## 2021-02-24 ENCOUNTER — Encounter: Payer: Self-pay | Admitting: Internal Medicine

## 2021-02-24 ENCOUNTER — Other Ambulatory Visit: Payer: Self-pay

## 2021-02-24 VITALS — BP 142/86 | HR 95 | Temp 98.6°F | Resp 16 | Ht 64.0 in | Wt 196.0 lb

## 2021-02-24 DIAGNOSIS — R51 Headache with orthostatic component, not elsewhere classified: Secondary | ICD-10-CM | POA: Diagnosis not present

## 2021-02-24 DIAGNOSIS — R7303 Prediabetes: Secondary | ICD-10-CM

## 2021-02-24 DIAGNOSIS — E785 Hyperlipidemia, unspecified: Secondary | ICD-10-CM

## 2021-02-24 DIAGNOSIS — B181 Chronic viral hepatitis B without delta-agent: Secondary | ICD-10-CM | POA: Diagnosis not present

## 2021-02-24 DIAGNOSIS — E118 Type 2 diabetes mellitus with unspecified complications: Secondary | ICD-10-CM

## 2021-02-24 DIAGNOSIS — Z298 Encounter for other specified prophylactic measures: Secondary | ICD-10-CM | POA: Insufficient documentation

## 2021-02-24 DIAGNOSIS — G8929 Other chronic pain: Secondary | ICD-10-CM

## 2021-02-24 DIAGNOSIS — Z124 Encounter for screening for malignant neoplasm of cervix: Secondary | ICD-10-CM

## 2021-02-24 DIAGNOSIS — M545 Low back pain, unspecified: Secondary | ICD-10-CM

## 2021-02-24 DIAGNOSIS — Z0001 Encounter for general adult medical examination with abnormal findings: Secondary | ICD-10-CM | POA: Diagnosis not present

## 2021-02-24 DIAGNOSIS — I10 Essential (primary) hypertension: Secondary | ICD-10-CM

## 2021-02-24 LAB — LIPID PANEL
Cholesterol: 194 mg/dL (ref 0–200)
HDL: 41.8 mg/dL (ref >39.00)
LDL Cholesterol: 129 mg/dL — ABNORMAL HIGH (ref 0–99)
NonHDL: 152.63
Total CHOL/HDL Ratio: 5
Triglycerides: 120 mg/dL (ref 0.0–149.0)
VLDL: 24 mg/dL (ref 0.0–40.0)

## 2021-02-24 LAB — HEPATIC FUNCTION PANEL
ALT: 16 U/L (ref 0–35)
AST: 21 U/L (ref 0–37)
Albumin: 4 g/dL (ref 3.5–5.2)
Alkaline Phosphatase: 81 U/L (ref 39–117)
Bilirubin, Direct: 0 mg/dL (ref 0.0–0.3)
Total Bilirubin: 0.5 mg/dL (ref 0.2–1.2)
Total Protein: 8 g/dL (ref 6.0–8.3)

## 2021-02-24 LAB — BASIC METABOLIC PANEL
BUN: 19 mg/dL (ref 6–23)
CO2: 29 mEq/L (ref 19–32)
Calcium: 9.9 mg/dL (ref 8.4–10.5)
Chloride: 102 mEq/L (ref 96–112)
Creatinine, Ser: 0.91 mg/dL (ref 0.40–1.20)
GFR: 72.69 mL/min (ref >60.00)
Glucose, Bld: 122 mg/dL — ABNORMAL HIGH (ref 70–99)
Potassium: 3.8 mEq/L (ref 3.5–5.1)
Sodium: 139 mEq/L (ref 135–145)

## 2021-02-24 LAB — HEMOGLOBIN A1C: Hgb A1c MFr Bld: 6.5 % (ref 4.6–6.5)

## 2021-02-24 NOTE — Progress Notes (Signed)
o  Subjective:  Patient ID: MYRA WENG, female    DOB: [birth year]  Age: 50 y.o. MRN: [id number]  CC: Annual Exam, Hypertension, and Headache  This visit occurred during the SARS-CoV-2 public health emergency.  Safety protocols were in place, including screening questions prior to the visit, additional usage of staff PPE, and extensive cleaning of exam room while observing appropriate contact time as indicated for disinfecting solutions.    HPI Kyannah T Belleau presents for a CPX and f/up -   Her mother recently died from complications of hypertension.  In a month she is traveling to [place] for her funeral.  She needs recommendations for malaria prophylaxis.  She complains of chronic, unchanged, nonradiating low back pain.  She complains of a 62-monthhistory of headache on the top of her head that she describes as a heaviness.  She is gotten some symptom relief with Tylenol.  She complains of dizziness but denies blurred vision, slurred speech, paresthesias, or ataxia.  Outpatient Medications Prior to Visit  Medication Sig Dispense Refill   acetaminophen (TYLENOL) 500 MG tablet Take 500 mg by mouth every 6 (six) hours as needed for headache (pain).     Multiple Vitamins-Minerals (CENTRUM ADULTS) TABS Take by mouth.     rivaroxaban (XARELTO) 20 MG TABS tablet TAKE 1 TABLET BY MOUTH ONCE DAILY IN THE MORNING 90 tablet 3   promethazine-dextromethorphan (PROMETHAZINE-DM) 6.25-15 MG/5ML syrup Take 5 mLs by mouth as needed for cough.     doxycycline (VIBRA-TABS) 100 MG tablet Take 1 tablet (100 mg total) by mouth 2 (two) times daily. 14 tablet 0   promethazine-dextromethorphan (PROMETHAZINE-DM) 6.25-15 MG/5ML syrup Take 5 mLs by mouth 4 (four) times daily as needed for cough. 180 mL 0   No facility-administered medications prior to visit.    ROS Review of Systems  Constitutional:  Negative for chills, fatigue and fever.  HENT: Negative.    Eyes: Negative.   Respiratory:  Negative  for apnea, cough, chest tightness, shortness of breath and wheezing.   Cardiovascular:  Negative for chest pain, palpitations and leg swelling.  Gastrointestinal:  Negative for abdominal pain, constipation, diarrhea, nausea and vomiting.  Endocrine: Negative.   Genitourinary: Negative.  Negative for difficulty urinating.  Musculoskeletal:  Positive for back pain. Negative for arthralgias and myalgias.  Skin: Negative.   Neurological:  Positive for dizziness and headaches. Negative for facial asymmetry, speech difficulty, weakness and light-headedness.  Hematological:  Negative for adenopathy. Does not bruise/bleed easily.  Psychiatric/Behavioral: Negative.     Objective:  BP (!) 142/86 (BP Location: Right Arm, Patient Position: Sitting, Cuff Size: Large)   Pulse 95   Temp 98.6 F (37 C) (Oral)   Resp 16   Ht 5' 4"  (1.626 m)   Wt 196 lb (88.9 kg)   LMP 07/24/2018 (Exact Date)   SpO2 97%   BMI 33.64 kg/m   BP Readings from Last 3 Encounters:  02/24/21 (!) 142/86  01/23/21 110/60  12/22/20 120/70    Wt Readings from Last 3 Encounters:  02/24/21 196 lb (88.9 kg)  01/23/21 195 lb 6.4 oz (88.6 kg)  12/22/20 198 lb 3.2 oz (89.9 kg)    Physical Exam Vitals reviewed.  Constitutional:      Appearance: She is well-developed.  HENT:     Nose: Nose normal.     Mouth/Throat:     Mouth: Mucous membranes are moist.  Eyes:     General: No scleral icterus.    Extraocular Movements: Extraocular  movements intact.     Pupils: Pupils are equal, round, and reactive to light.  Cardiovascular:     Rate and Rhythm: Normal rate and regular rhythm.     Heart sounds: No murmur heard. Pulmonary:     Effort: Pulmonary effort is normal.     Breath sounds: No stridor. No wheezing, rhonchi or rales.  Abdominal:     General: Abdomen is flat.     Palpations: There is no mass.     Tenderness: There is no abdominal tenderness. There is no guarding.     Hernia: No hernia is present.   Musculoskeletal:        General: Normal range of motion.     Cervical back: Normal range of motion and neck supple.     Right lower leg: No edema.     Left lower leg: No edema.  Lymphadenopathy:     Cervical: No cervical adenopathy.  Skin:    General: Skin is warm and dry.     Findings: No rash.  Neurological:     General: No focal deficit present.     Mental Status: She is alert.  Psychiatric:        Mood and Affect: Mood normal.        Behavior: Behavior normal.    Lab Results  Component Value Date   WBC 5.0 12/14/2020   HGB 14.7 12/14/2020   HCT 42.6 12/14/2020   PLT 237 12/14/2020   GLUCOSE 122 (H) 02/24/2021   CHOL 194 02/24/2021   TRIG 120.0 02/24/2021   HDL 41.80 02/24/2021   LDLCALC 129 (H) 02/24/2021   ALT 16 02/24/2021   AST 21 02/24/2021   NA 139 02/24/2021   K 3.8 02/24/2021   CL 102 02/24/2021   CREATININE 0.91 02/24/2021   BUN 19 02/24/2021   CO2 29 02/24/2021   TSH 1.43 06/30/2015   INR 2.80 03/20/2012   HGBA1C 6.5 02/24/2021    MM 3D SCREEN BREAST BILATERAL  Result Date: 02/05/2021 CLINICAL DATA:  Screening. EXAM: DIGITAL SCREENING BILATERAL MAMMOGRAM WITH TOMOSYNTHESIS AND CAD TECHNIQUE: Bilateral screening digital craniocaudal and mediolateral oblique mammograms were obtained. Bilateral screening digital breast tomosynthesis was performed. The images were evaluated with computer-aided detection. COMPARISON:  Previous exam(s). ACR Breast Density Category b: There are scattered areas of fibroglandular density. FINDINGS: There are no findings suspicious for malignancy. IMPRESSION: No mammographic evidence of malignancy. A result letter of this screening mammogram will be mailed directly to the patient. RECOMMENDATION: Screening mammogram in one year. (Code:SM-B-01Y) BI-RADS CATEGORY  1: Negative. Electronically Signed   By: Lillia Mountain M.D.   On: 02/05/2021 16:18    Assessment & Plan:   Luetta was seen today for annual exam, hypertension and  headache.  Diagnoses and all orders for this visit:  Chronic viral hepatitis B without delta agent and without coma ([place])- Her hep B DNA remains undetectable. -     Hepatic function panel; Future -     Hepatitis B DNA, ultraquantitative, PCR; Future -     AFP tumor marker; Future -     AFP tumor marker -     Hepatitis B DNA, ultraquantitative, PCR -     Hepatic function panel  Prediabetes- Her A1c is up to 6.5%.  Medical therapy is not yet indicated. -     Basic metabolic panel; Future -     Hemoglobin A1c; Future -     Hemoglobin A1c -     Basic metabolic panel  Encounter for general adult medical examination with abnormal findings- Exam completed, labs reviewed, vaccines reviewed and updated, cancer screenings addressed, patient education was given. -     Lipid panel; Future -     Lipid panel  Screening for cervical cancer -     Ambulatory referral to Gynecology  Chronic midline low back pain without sciatica -     Ambulatory referral to Sports Medicine  Need for malaria prophylaxis -     Ambulatory referral to Infectious Disease  Positional headache- She has a new onset headache with concerning features.  I recommended that she undergo an MRI/MRA of the brain to screen for aneurysm, mass, bleed, tumor, and NPH. -     MR Brain Wo Contrast; Future -     MR Angiogram Head Wo Contrast; Future  Hyperlipidemia with target LDL less than 100- I have asked her to take a statin for cardiovascular risk reduction. -     rosuvastatin (CRESTOR) 10 MG tablet; Take 1 tablet (10 mg total) by mouth daily.  Type II diabetes mellitus with manifestations ([place]) -     Ambulatory referral to Ophthalmology  Primary hypertension- Her blood pressure is not adequately well controlled and she is symptomatic.  I recommended that she start taking indapamide. -     indapamide (LOZOL) 1.25 MG tablet; Take 1 tablet (1.25 mg total) by mouth daily.  I have discontinued [employer] [employer]'s doxycycline,  promethazine-dextromethorphan, and promethazine-dextromethorphan. I am also having her start on rosuvastatin and indapamide. Additionally, I am having her maintain her acetaminophen, Centrum Adults, and rivaroxaban.  Meds ordered this encounter  Medications   rosuvastatin (CRESTOR) 10 MG tablet    Sig: Take 1 tablet (10 mg total) by mouth daily.    Dispense:  90 tablet    Refill:  1   indapamide (LOZOL) 1.25 MG tablet    Sig: Take 1 tablet (1.25 mg total) by mouth daily.    Dispense:  90 tablet    Refill:  0      Follow-up: Return in about 3 months (around 05/27/2021).  Scarlette Calico, MD

## 2021-02-24 NOTE — Patient Instructions (Signed)
Health Maintenance, Female Adopting a healthy lifestyle and getting preventive care are important in promoting health and wellness. Ask your health care provider about: The right schedule for you to have regular tests and exams. Things you can do on your own to prevent diseases and keep yourself healthy. What should I know about diet, weight, and exercise? Eat a healthy diet  Eat a diet that includes plenty of vegetables, fruits, low-fat dairy products, and lean protein. Do not eat a lot of foods that are high in solid fats, added sugars, or sodium.  Maintain a healthy weight Body mass index (BMI) is used to identify weight problems. It estimates body fat based on height and weight. Your health care provider can help determineyour BMI and help you achieve or maintain a healthy weight. Get regular exercise Get regular exercise. This is one of the most important things you can do for your health. Most adults should: Exercise for at least 150 minutes each week. The exercise should increase your heart rate and make you sweat (moderate-intensity exercise). Do strengthening exercises at least twice a week. This is in addition to the moderate-intensity exercise. Spend less time sitting. Even light physical activity can be beneficial. Watch cholesterol and blood lipids Have your blood tested for lipids and cholesterol at 52 years of age, then havethis test every 5 years. Have your cholesterol levels checked more often if: Your lipid or cholesterol levels are high. You are older than 52 years of age. You are at high risk for heart disease. What should I know about cancer screening? Depending on your health history and family history, you may need to have cancer screening at various ages. This may include screening for: Breast cancer. Cervical cancer. Colorectal cancer. Skin cancer. Lung cancer. What should I know about heart disease, diabetes, and high blood pressure? Blood pressure and heart  disease High blood pressure causes heart disease and increases the risk of stroke. This is more likely to develop in people who have high blood pressure readings, are of African descent, or are overweight. Have your blood pressure checked: Every 3-5 years if you are 18-39 years of age. Every year if you are 40 years old or older. Diabetes Have regular diabetes screenings. This checks your fasting blood sugar level. Have the screening done: Once every three years after age 40 if you are at a normal weight and have a low risk for diabetes. More often and at a younger age if you are overweight or have a high risk for diabetes. What should I know about preventing infection? Hepatitis B If you have a higher risk for hepatitis B, you should be screened for this virus. Talk with your health care provider to find out if you are at risk forhepatitis B infection. Hepatitis C Testing is recommended for: Everyone born from 1945 through 1965. Anyone with known risk factors for hepatitis C. Sexually transmitted infections (STIs) Get screened for STIs, including gonorrhea and chlamydia, if: You are sexually active and are younger than 52 years of age. You are older than 52 years of age and your health care provider tells you that you are at risk for this type of infection. Your sexual activity has changed since you were last screened, and you are at increased risk for chlamydia or gonorrhea. Ask your health care provider if you are at risk. Ask your health care provider about whether you are at high risk for HIV. Your health care provider may recommend a prescription medicine to help   prevent HIV infection. If you choose to take medicine to prevent HIV, you should first get tested for HIV. You should then be tested every 3 months for as long as you are taking the medicine. Pregnancy If you are about to stop having your period (premenopausal) and you may become pregnant, seek counseling before you get  pregnant. Take 400 to 800 micrograms (mcg) of folic acid every day if you become pregnant. Ask for birth control (contraception) if you want to prevent pregnancy. Osteoporosis and menopause Osteoporosis is a disease in which the bones lose minerals and strength with aging. This can result in bone fractures. If you are 46 years old or older, or if you are at risk for osteoporosis and fractures, ask your health care provider if you should: Be screened for bone loss. Take a calcium or vitamin D supplement to lower your risk of fractures. Be given hormone replacement therapy (HRT) to treat symptoms of menopause. Follow these instructions at home: Lifestyle Do not use any products that contain nicotine or tobacco, such as cigarettes, e-cigarettes, and chewing tobacco. If you need help quitting, ask your health care provider. Do not use street drugs. Do not share needles. Ask your health care provider for help if you need support or information about quitting drugs. Alcohol use Do not drink alcohol if: Your health care provider tells you not to drink. You are pregnant, may be pregnant, or are planning to become pregnant. If you drink alcohol: Limit how much you use to 0-1 drink a day. Limit intake if you are breastfeeding. Be aware of how much alcohol is in your drink. In the [place] one drink equals one 12 oz bottle of beer (355 mL), one 5 oz glass of wine (148 mL), or one 1 oz glass of hard liquor (44 mL). General instructions Schedule regular health, dental, and eye exams. Stay current with your vaccines. Tell your health care provider if: You often feel depressed. You have ever been abused or do not feel safe at home. Summary Adopting a healthy lifestyle and getting preventive care are important in promoting health and wellness. Follow your health care provider's instructions about healthy diet, exercising, and getting tested or screened for diseases. Follow your health care provider's  instructions on monitoring your cholesterol and blood pressure. This information is not intended to replace advice given to you by your health care provider. Make sure you discuss any questions you have with your healthcare provider. Document Revised: 07/12/2018 Document Reviewed: 07/12/2018 Elsevier Patient Education  2022 [employer].

## 2021-02-26 ENCOUNTER — Encounter: Payer: Self-pay | Admitting: Internal Medicine

## 2021-02-26 DIAGNOSIS — E118 Type 2 diabetes mellitus with unspecified complications: Secondary | ICD-10-CM | POA: Insufficient documentation

## 2021-02-26 DIAGNOSIS — E785 Hyperlipidemia, unspecified: Secondary | ICD-10-CM | POA: Insufficient documentation

## 2021-02-26 LAB — AFP TUMOR MARKER: AFP-Tumor Marker: 3.5 ng/mL

## 2021-02-26 LAB — HEPATITIS B DNA, ULTRAQUANTITATIVE, PCR
Hepatitis B DNA (Calc): <1 Log IU/mL
Hepatitis B DNA: <10 IU/mL

## 2021-02-26 MED ORDER — ROSUVASTATIN CALCIUM 10 MG PO TABS: 10.0000 mg | ORAL_TABLET | Freq: Every day | ORAL | 1 refills | Status: DC

## 2021-02-27 DIAGNOSIS — I10 Essential (primary) hypertension: Secondary | ICD-10-CM | POA: Insufficient documentation

## 2021-02-27 MED ORDER — INDAPAMIDE 1.25 MG PO TABS: 1.2500 mg | ORAL_TABLET | Freq: Every day | ORAL | 0 refills | Status: DC

## 2021-03-02 ENCOUNTER — Ambulatory Visit: Payer: BC Managed Care – PPO | Admitting: Internal Medicine

## 2021-03-02 ENCOUNTER — Other Ambulatory Visit: Payer: Self-pay

## 2021-03-02 ENCOUNTER — Encounter: Payer: Self-pay | Admitting: Internal Medicine

## 2021-03-02 DIAGNOSIS — Z7184 Encounter for health counseling related to travel: Secondary | ICD-10-CM

## 2021-03-02 MED ORDER — ATOVAQUONE-PROGUANIL HCL 250-100 MG PO TABS: 1.0000 | ORAL_TABLET | Freq: Every day | ORAL | 0 refills | Status: DC

## 2021-03-02 NOTE — Progress Notes (Signed)
Subjective:   [name] is a 50 y.o. female who presents to the Infectious Disease clinic for travel consultation. Planned departure date: March 31, 2021          Planned return date: 6 weeks Countries of travel: [place] Areas in country: urban   Accommodations: private home Purpose of travel: family visit Prior travel out of [place]: yes     Objective:   Medications: reviewed    Assessment:   No contraindications to travel. none     Plan:    Issues discussed: environmental concerns, insect-borne illnesses, safe food/water, traveler's diarrhea, and Yellow Fever. Immunizations recommended: Typhoid (parenteral). Malaria prophylaxis: malarone, daily dose starting 1-2 days before entering endemic area, ending 7 days after leaving area Traveler's diarrhea prophylaxis:  food precautions . Total duration of visit: 30 Minutes. Total time spent on education, counseling, coordination of care: 15 Minutes.

## 2021-03-02 NOTE — Patient Instructions (Signed)
[facility]                [address], [place],  [address]                      Phone: [phone]                        Fax: [phone]   Planned departure date: 03/31/21       Planned return date: 6 weeks Countries of travel: [place]   Guidelines for the Prevention & Treatment of Traveler's Diarrhea  Prevention: "Boil it, Peel it, Cook it, or Forget it"   the fewer chances -> lower risk: try to stick to food & water precautions as much as possible"   If it's "piping hot"; it is probably okay, if not, it may not be   Treatment   1) You should always take care to drink lots of fluids in order to avoid dehydration   2) You should bring medications with you in case you come down with a case of diarrhea   3) OTC = bring pepto-bismol - can take with initial abdominal symptoms;                    Imodium - can help slow down your intestinal tract, can help relief cramps                    and diarrhea, can take if no bloody diarrhea  Use  food precautions  if needed for traveler's diarrhea  Guidelines for the Prevention of Malaria  Avoidance:  -fewer mosquito bites = lower risk. Mosquitos can bite at night as well as daytime  -cover up (long sleeve clothing), mosquito nets, screens  -Insect repellent for your skin ( DEET containing lotion > 20%): for clothes ( permethrin spray) www.insectshield.com 915-361-1077) for pretreated permethrin  On about 8/28 start malarone, daily dose starting 1-2 days before entering endemic area, ending 7 days after leaving area for malaria prevention.   Immunizations received today: none Future immunizations, if indicated Typhoid (parenteral), will schedule a nurse visit   Prior to travel:  1) Be sure to pick up appropriate prescriptions, including medicine you take daily. Do not expect to be able to fill your prescriptions abroad.  2) Strongly consider  obtaining traveler's insurance, including emergency evacuation insurance. Most plans in the [place] do not cover participants abroad. (see below for resources)  3) Register at the appropriate [place] embassy or consulate with travel dates so they are aware of your presence in-country and for helpful advice during travel using the [employer] (STEP, GreenNylon.com.cy).  4) Leave contact information with a relative or friend.  5) Keep a Research officer, political party, credit cards in case they become lost or stolen  6) Inform your credit card company that you will be travelling abroad   During travel:  1) If you become ill and need medical advice, the [place] [employer]	[place] of the country you are traveling in general provides a list of [place] speaking doctors.  We are also available on MyChart for remote consultation if you register prior to travel. 2) Avoid motorcycles or scooters when at all possible. Traffic laws in many countries are lax and accidents occur frequently.  3) Do not take any unnecessary risks that you wouldn't do at home.   Resources:  -Country specific information: BlindResource.ca or GreenNylon.com.cy  -Press photographer (DEET, mosquito nets): [employer], [employer] store, [employer], [place] insurance options: gatewayplans.com; http://clayton-rivera.info/; travelguard.com or [employer], gninsurance.com or info@gninsurance .com, (337) 676-5539.   Post Travel:  If you return from your trip ill, call your primary care doctor or our travel clinic @ [phone].   Enjoy your trip and know that with proper pre-travel preparation, most people have an enjoyable and uninterrupted trip!

## 2021-03-08 ENCOUNTER — Ambulatory Visit
Admission: RE | Admit: 2021-03-08 | Discharge: 2021-03-08 | Disposition: A | Discharge status: Home / Self Care | Payer: BC Managed Care – PPO | Source: Ambulatory Visit | Attending: Internal Medicine | Admitting: Internal Medicine

## 2021-03-08 DIAGNOSIS — R51 Headache with orthostatic component, not elsewhere classified: Secondary | ICD-10-CM

## 2021-03-08 IMAGING — MR MR MRA HEAD W/O CM
1 series · 23 of 48 positions shown · non-contrast
Comparison: No pertinent prior exam.

CLINICAL DATA: Headache

EXAM:
MRA HEAD WITHOUT CONTRAST
TECHNIQUE: Angiographic images of the Circle of Willis were acquired using MRA
technique without intravenous contrast.

[Series 4: tof_3d_multi-slab · axial · 0.7mm · 0.35mm/px · z∈[-64,+26]mm · 23 of 136 slices shown]
[im 1/136]
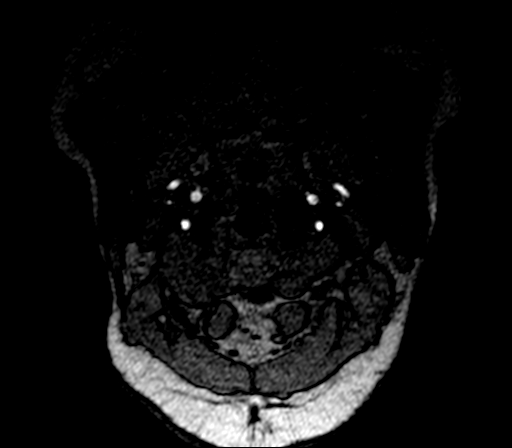
[im 3/136]
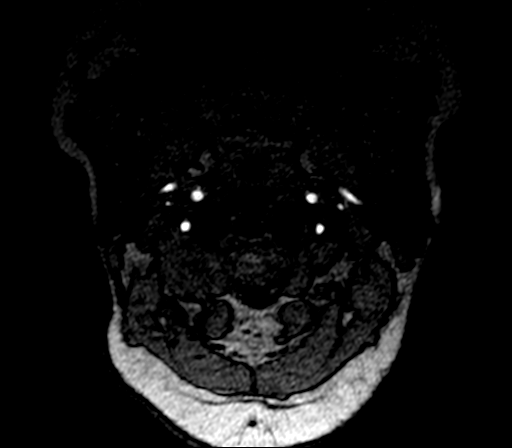
[im 6/136]
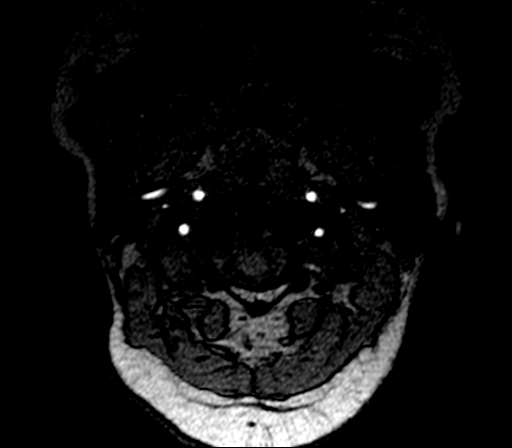
[im 9/136]
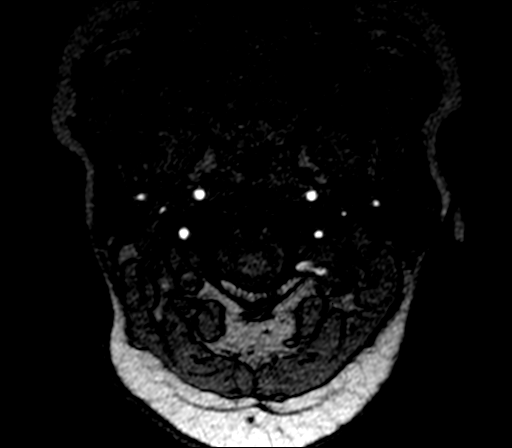
[im 12/136]
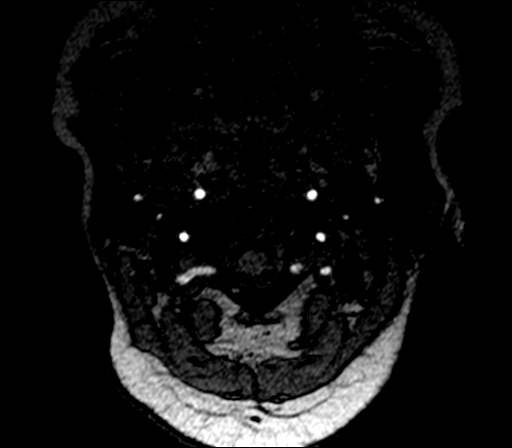
[im 15/136]
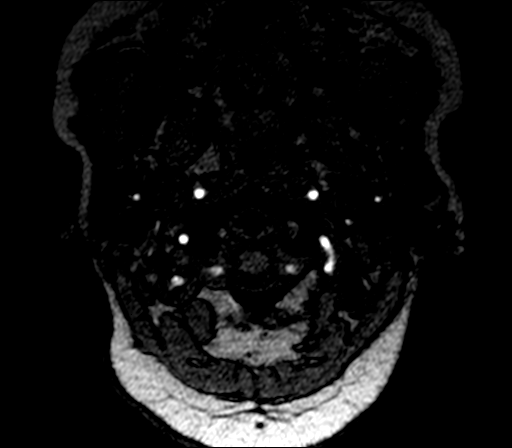
[im 18/136]
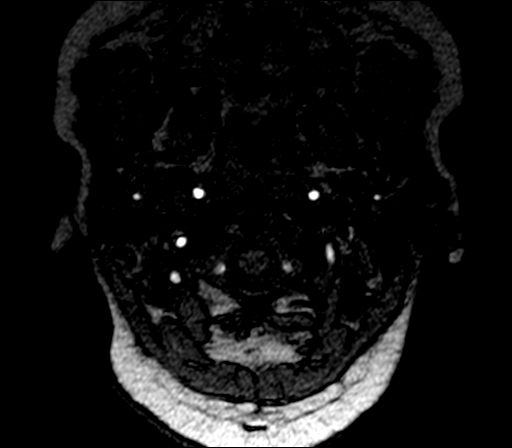
[im 21/136]
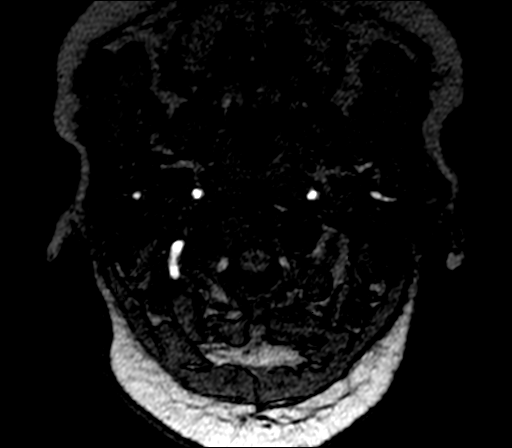
[im 23/136]
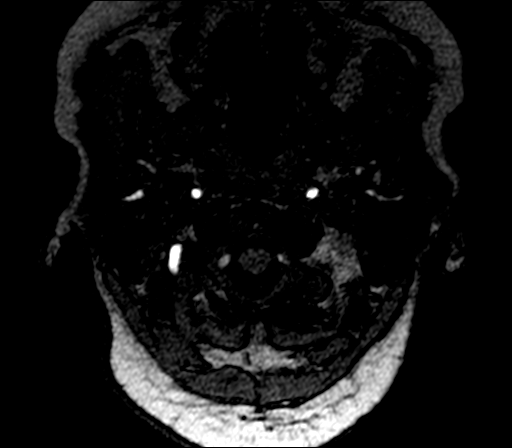
[im 26/136]
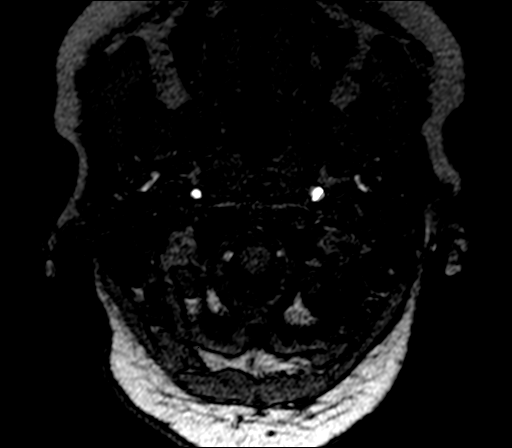
[im 29/136]
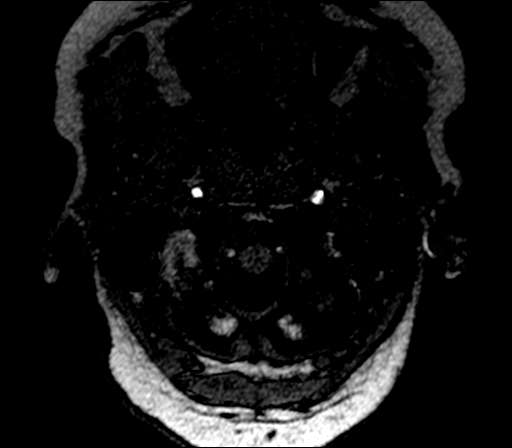
[im 32/136]
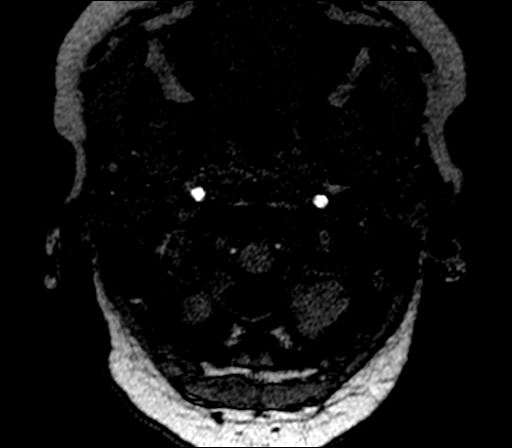
[im 35/136]
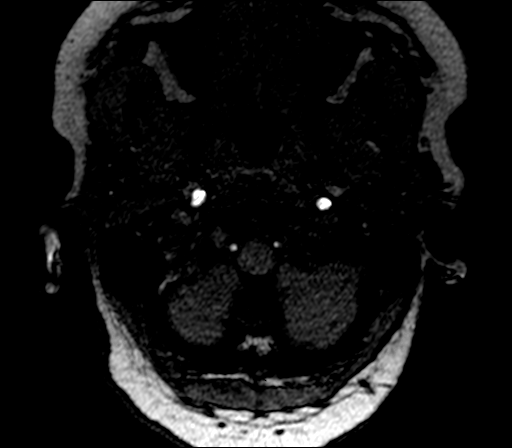
[im 38/136]
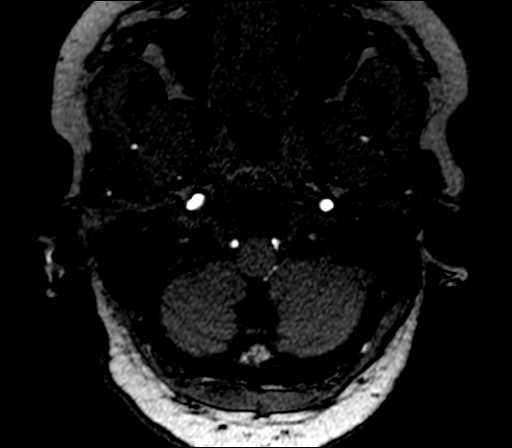
[im 41/136]
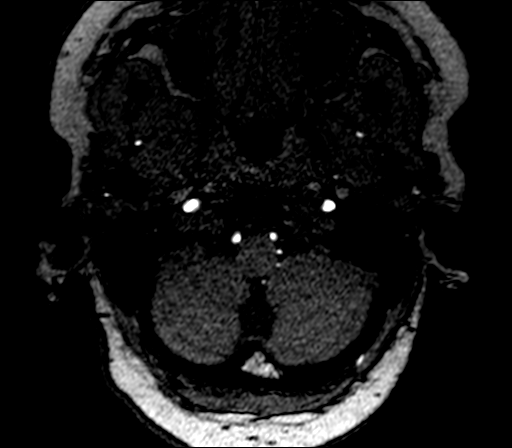
[im 44/136]
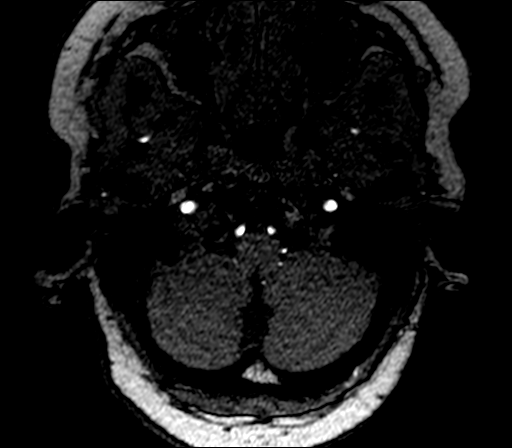
[im 61/136]
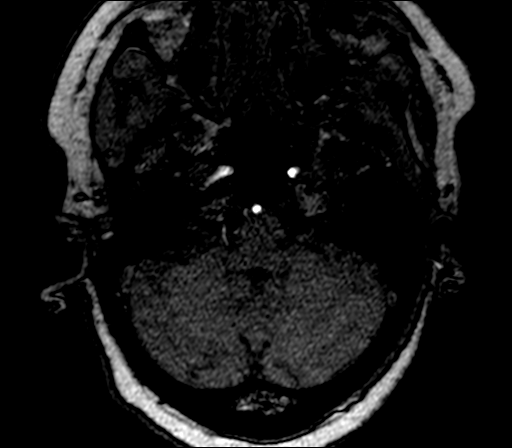
[im 69/136]
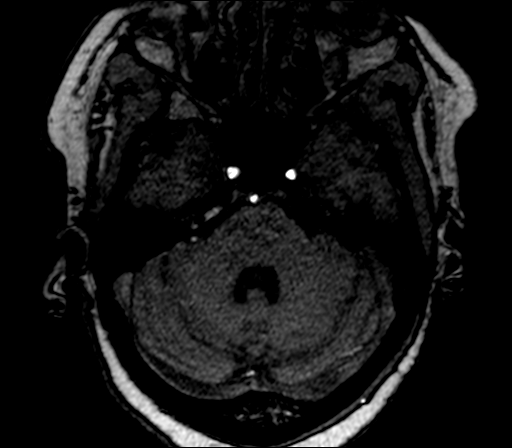
[im 78/136]
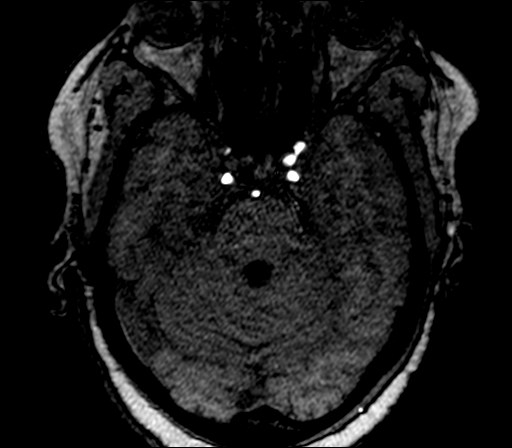
[im 95/136]
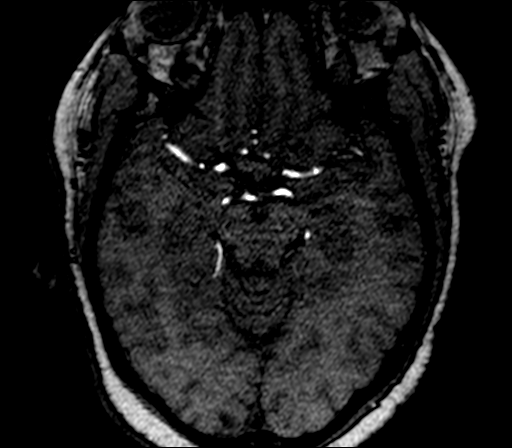
[im 113/136]
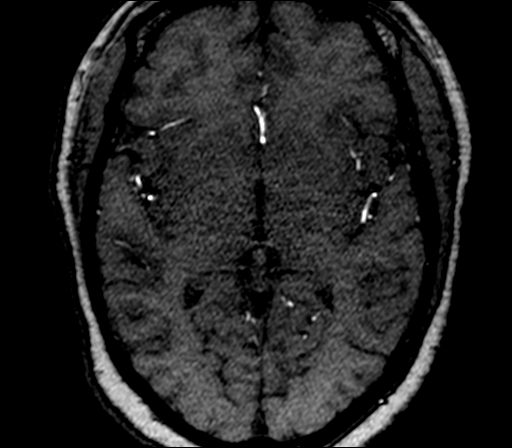
[im 115/136]
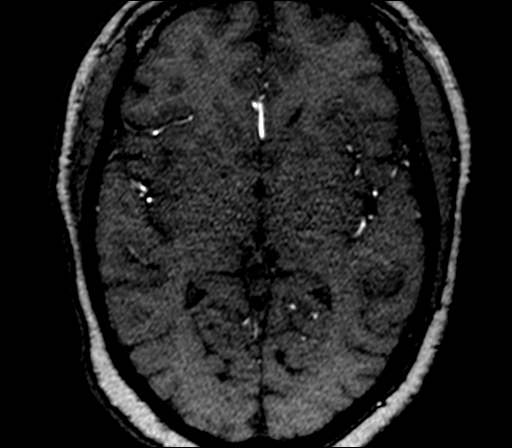
[im 130/136]
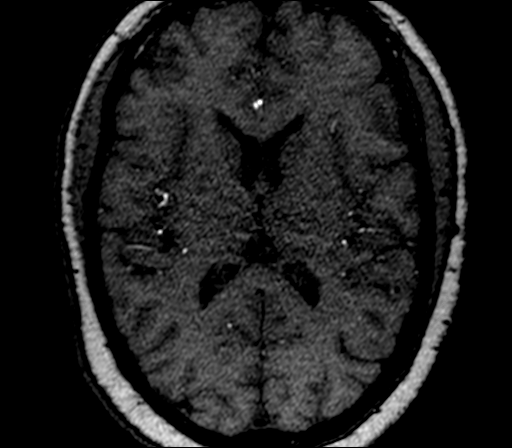

[23 of 48 positions shown; findings below may reference images not displayed]

FINDINGS: POSTERIOR CIRCULATION:

--Vertebral arteries: Normal

--Inferior cerebellar arteries: Normal.

--Basilar artery: Normal.

--Superior cerebellar arteries: Normal.

--Posterior cerebral arteries: Normal.

ANTERIOR CIRCULATION:

--Intracranial internal carotid arteries: Normal.

--Anterior cerebral arteries (ACA): Normal.

--Middle cerebral arteries (MCA): Normal.

ANATOMIC VARIANTS: Both posterior communicating arteries are patent
IMPRESSION: Normal intracranial MRA.

## 2021-03-08 IMAGING — MR MR HEAD W/O CM
11 series · 48 of 48 positions shown · non-contrast
Comparison: No pertinent prior exam.

CLINICAL DATA: Headache

EXAM:
MRI HEAD WITHOUT CONTRAST
MRA HEAD WITHOUT CONTRAST
TECHNIQUE: Multiplanar, multi-echo pulse sequences of the brain and surrounding
structures were acquired without intravenous contrast. Angiographic
images of the Circle of Willis were acquired using MRA technique
without intravenous contrast.

[Series 1: DWI · axial · 3.0mm · 1.80mm/px · z∈[-55,+92]mm · 9 of 100 slices shown (1 of 4)]
[im 1/100]
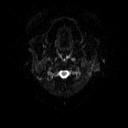
[im 13/100]
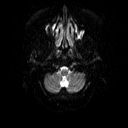
[im 25/100]
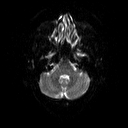
[im 38/100]
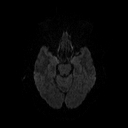
[im 50/100]
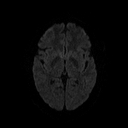
[im 62/100]
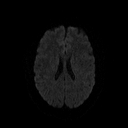
[im 75/100]
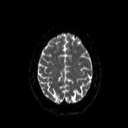
[im 87/100]
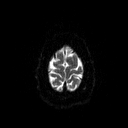
[im 100/100]
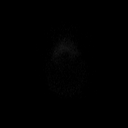

[Series 2: DWI · axial · 3.0mm · 1.80mm/px · z∈[-55,+92]mm · 4 of 50 slices shown (2 of 4)]
[im 1/50]
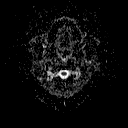
[im 17/50]
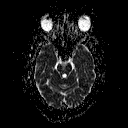
[im 33/50]
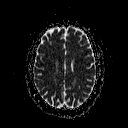
[im 50/50]
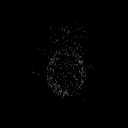

[Series 3: DWI · coronal · 5.0mm · 1.80mm/px · 6 of 68 slices shown (3 of 4)]
[im 1/68]
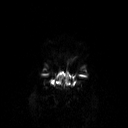
[im 14/68]
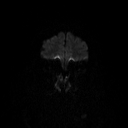
[im 27/68]
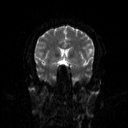
[im 41/68]
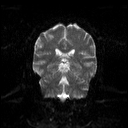
[im 54/68]
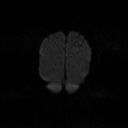
[im 68/68]
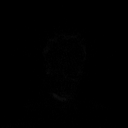

[Series 4: DWI · coronal · 5.0mm · 1.80mm/px · 3 of 34 slices shown (4 of 4)]
[im 1/34]
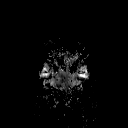
[im 17/34]
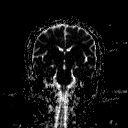
[im 34/34]
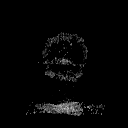

[Series 5: T2 · axial · 5.0mm · 0.60mm/px · z∈[-60,+87]mm · 2 of 22 slices shown (1 of 2)]
[im 1/22]
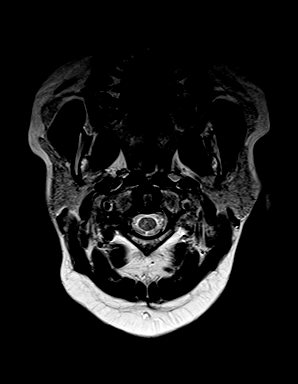
[im 22/22]
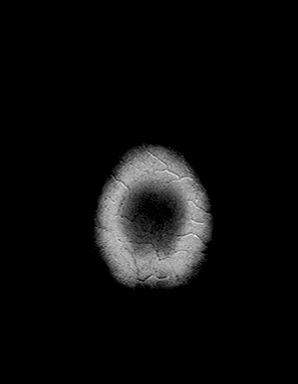

[Series 6: FLAIR · axial · 3.0mm · 0.45mm/px · z∈[-53,+81]mm · 3 of 30 slices shown]
[im 1/30]
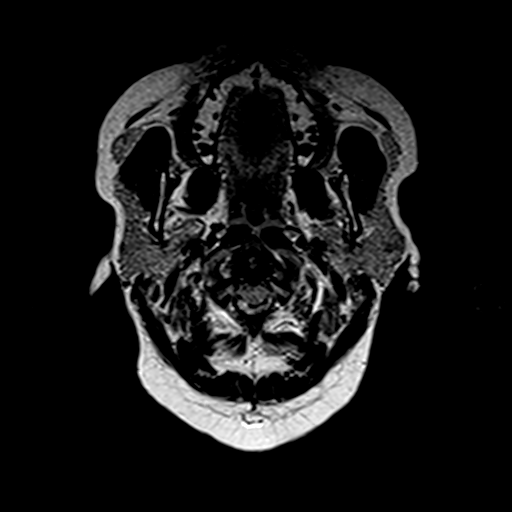
[im 15/30]
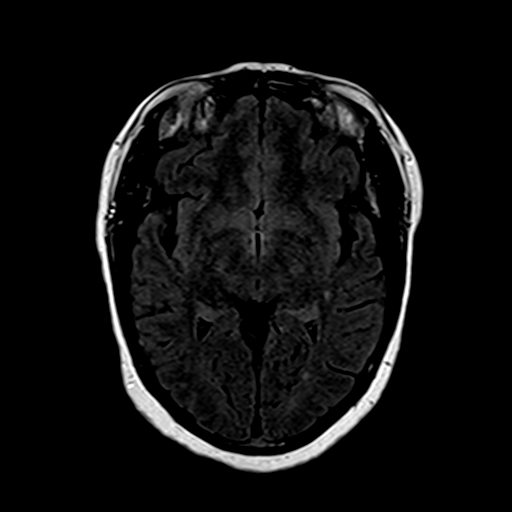
[im 30/30]
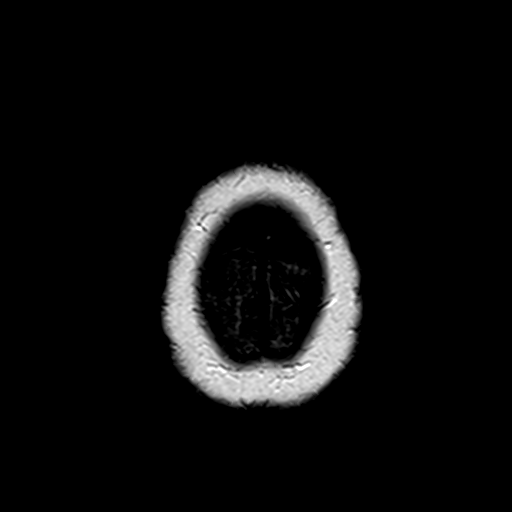

[Series 7: mip_images(sw) · axial · 32.0mm · 0.90mm/px · z∈[-42,+70]mm · 2 of 29 slices shown]
[im 1/29]
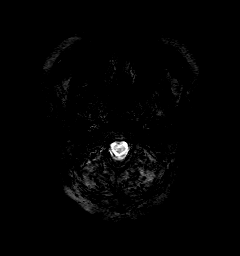
[im 29/29]
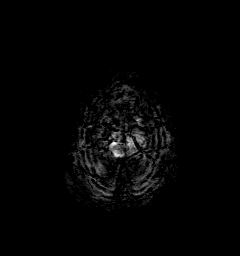

[Series 8: swi_images · axial · 4.0mm · 0.90mm/px · z∈[-56,+84]mm · 3 of 36 slices shown]
[im 1/36]
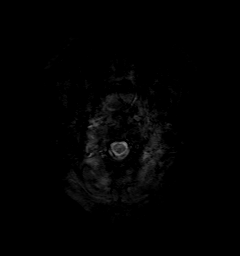
[im 18/36]
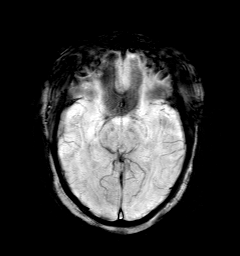
[im 36/36]
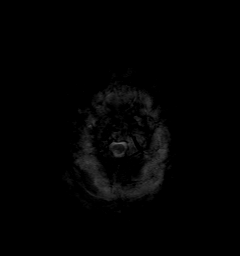

[Series 9: t1_mpr_tra · axial · 1.0mm · 0.71mm/px · z∈[-58,+85]mm · 12 of 144 slices shown]
[im 1/144]
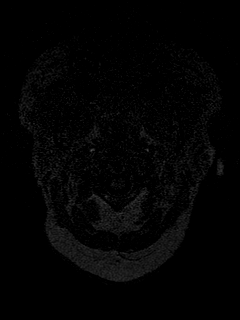
[im 14/144]
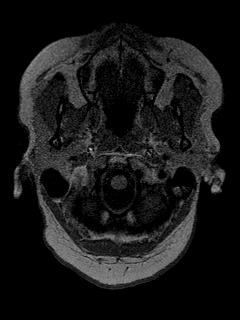
[im 27/144]
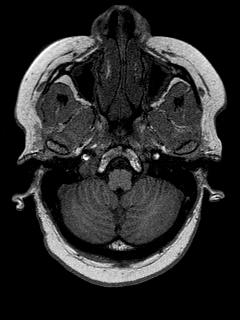
[im 40/144]
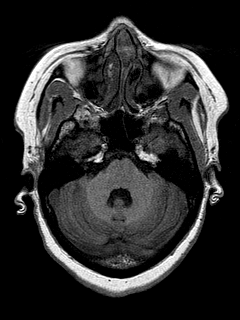
[im 53/144]
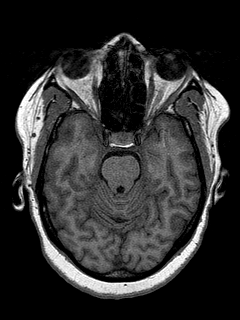
[im 66/144]
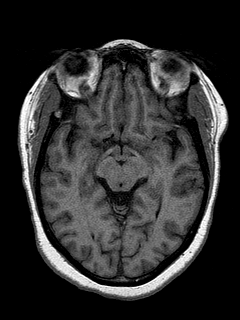
[im 79/144]
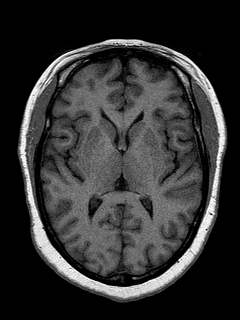
[im 92/144]
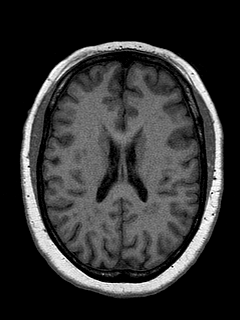
[im 105/144]
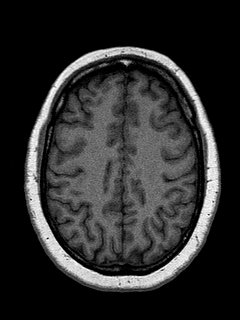
[im 118/144]
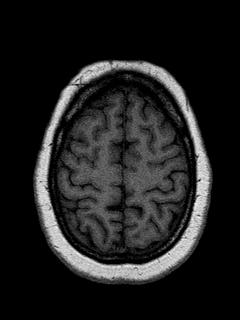
[im 131/144]
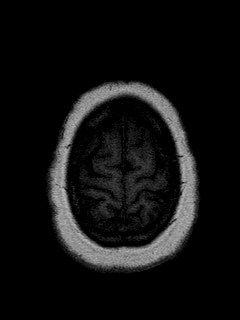
[im 144/144]
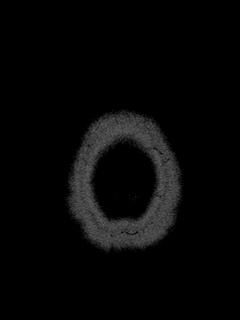

[Series 10: T2 · coronal · 5.0mm · 0.45mm/px · 2 of 25 slices shown (2 of 2)]
[im 1/25]
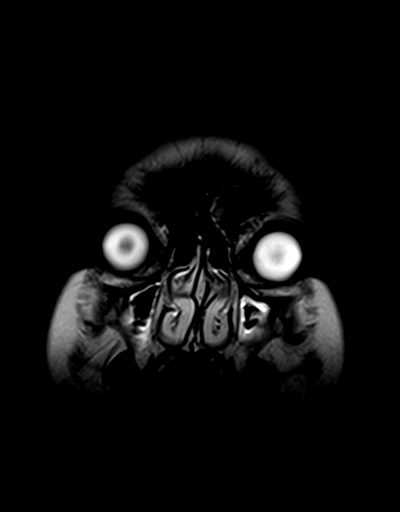
[im 25/25]
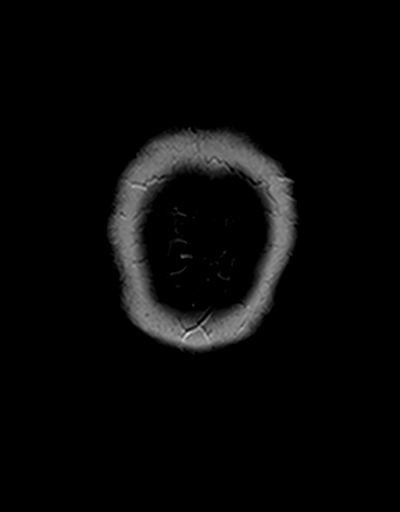

[Series 11: T1 · sagittal · 5.0mm · 0.45mm/px · 2 of 21 slices shown]
[im 1/21]
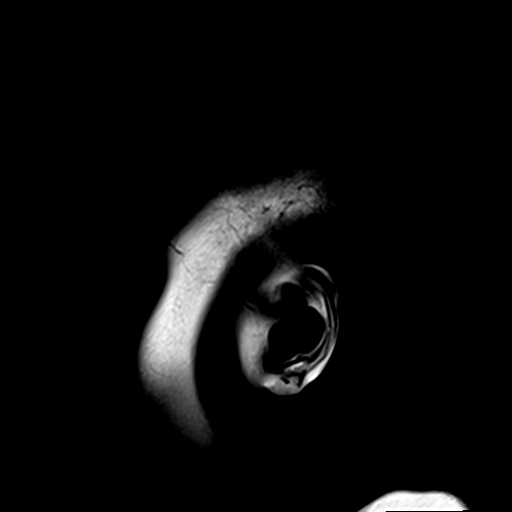
[im 21/21]
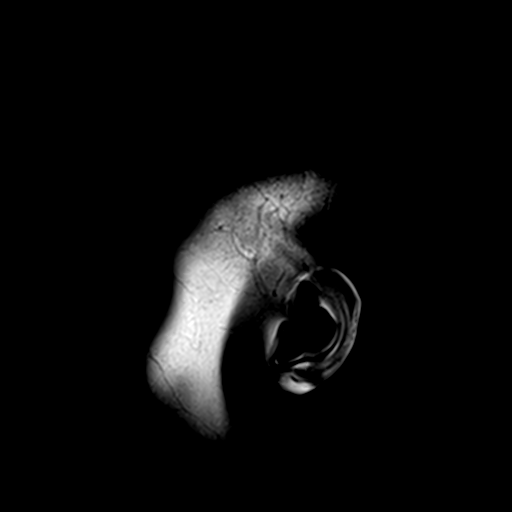

[48 of 48 positions shown; findings below may reference images not displayed]

FINDINGS: MRI HEAD FINDINGS

Brain: No acute infarct, mass effect or extra-axial collection. No
acute or chronic hemorrhage. There is multifocal hyperintense
T2-weighted signal within the white matter. Parenchymal volume and
CSF spaces are normal. The midline structures are normal.

Vascular: Major flow voids are preserved.

Skull and upper cervical spine: Normal calvarium and skull base.
Visualized upper cervical spine and soft tissues are normal.

Sinuses/Orbits:No paranasal sinus fluid levels or advanced mucosal
thickening. No mastoid or middle ear effusion. Normal orbits.

MRA HEAD FINDINGS

POSTERIOR CIRCULATION:

--Vertebral arteries: Normal

--Inferior cerebellar arteries: Normal.

--Basilar artery: Normal.

--Superior cerebellar arteries: Normal.

--Posterior cerebral arteries: Normal.

ANTERIOR CIRCULATION:

--Intracranial internal carotid arteries: Normal.

--Anterior cerebral arteries (ACA): Normal.

--Middle cerebral arteries (MCA): Normal.

ANATOMIC VARIANTS: Both posterior communicating arteries are patent
IMPRESSION: 1. No acute intracranial abnormality.
2. Normal intracranial MRA.
3. Mild chronic small vessel disease.

## 2021-03-11 ENCOUNTER — Other Ambulatory Visit: Payer: BC Managed Care – PPO

## 2021-03-17 ENCOUNTER — Telehealth: Payer: Self-pay | Admitting: Internal Medicine

## 2021-03-17 NOTE — Telephone Encounter (Signed)
Received the letter going over her last lab results. Requesting a call back to discuss the results further   Best callback #: [phone]

## 2021-03-17 NOTE — Telephone Encounter (Signed)
I have spoke to pt in regard to her lab concerns. She stated that she doesn't understand the DM diagnosis or was made aware of that prior. She would like to know what does she need to do for that. Please advise.

## 2021-03-18 NOTE — Telephone Encounter (Signed)
Called pt, LVM.   

## 2021-03-19 NOTE — Progress Notes (Signed)
[facility] [place] Phone: [phone] Subjective:   Rito Ehrlich, am serving as a scribe for Dr. Hulan Saas.  I'm seeing this patient by the request  of:  Janith Lima, MD  CC: Low back pain  MMN:[id number]  [name] is a 50 y.o. female coming in with complaint of back pain. Patient states in 2018 had an incident where her back went out and was given a muscle relaxer to help. States that she is having bilateral lower back pain that radiates down both legs. This has been hurting her for about a month. Patient states she is unable to bend down or get back up, hurts to stay on her feet, but also hurts to stay sitting. Patient states that she has a blood clot in R leg that always swells up. Patient has a back brace to wear when pain gets too bad and advil at night. Patient also experiencing numbness in both legs but not weakness.    Patient did have liver enzymes recently that were unremarkable.  Patient also had an AFP marker that was normal  Past Medical History:  Diagnosis Date   CTEPH (chronic thromboembolic pulmonary hypertension) ([facility])    DVT (deep venous thrombosis) ([facility])    Hepatitis B infection    Hep B core Ab 04/2005   Primary hypercoagulable state ([facility])    Pulmonary embolism ([facility]) 2005       Pulmonary embolism ([facility])    Seasonal allergies    Past Surgical History:  Procedure Laterality Date   CARDIAC CATHETERIZATION  11/2005   R heart cath : Severe pulmonary arterial HTN with evidence of cor pulmonale. Vasodilatory challenge not performed.   Pulmonary artery endarterectomy  2007   VENA CAVA FILTER PLACEMENT     Social History   Socioeconomic History   Marital status: Single    Spouse name: Not on file   Number of children: 3   Years of education: Not on file   Highest education level: Not on file  Occupational History    Employer: UNEMPLOYED  Tobacco Use   Smoking status: Never    Smokeless tobacco: Never  Vaping Use   Vaping Use: Never used  Substance and Sexual Activity   Alcohol use: No    Alcohol/week: 0.0 standard drinks   Drug use: No   Sexual activity: Not Currently    Partners: Male  Other Topics Concern   Not on file  Social History Narrative   ** Merged History Encounter **       Pt lives in [place], braids hair for a living.    Social Determinants of Health   Financial Resource Strain: Not on file  Food Insecurity: Not on file  Transportation Needs: Not on file  Physical Activity: Not on file  Stress: Not on file  Social Connections: Not on file   Allergies  Allergen Reactions   Aspirin Other (See Comments)    Other Reaction: GI Upset Other Reaction: GI Upset Since being in [place], told she has an ulcer   Aspirin Other (See Comments)    Gi upset/ulcer   Family History  Problem Relation Age of Onset   Hypertension Mother    Hypertension Father        deceased   Alcohol abuse Neg Hx    Cancer Neg Hx    Diabetes Neg Hx    Early death Neg Hx    Heart disease Neg Hx  Hyperlipidemia Neg Hx    Kidney disease Neg Hx    Stroke Neg Hx      Current Outpatient Medications (Cardiovascular):    indapamide (LOZOL) 1.25 MG tablet, Take 1 tablet (1.25 mg total) by mouth daily.   rosuvastatin (CRESTOR) 10 MG tablet, Take 1 tablet (10 mg total) by mouth daily.   Current Outpatient Medications (Analgesics):    acetaminophen (TYLENOL) 500 MG tablet, Take 500 mg by mouth every 6 (six) hours as needed for headache (pain).  Current Outpatient Medications (Hematological):    rivaroxaban (XARELTO) 20 MG TABS tablet, TAKE 1 TABLET BY MOUTH ONCE DAILY IN THE MORNING  Current Outpatient Medications (Other):    atovaquone-proguanil (MALARONE) 250-100 MG TABS tablet, Take 1 tablet by mouth daily. Start 2 days prior to travel to malaria area, throughout travel and for 7 days upon return.   gabapentin (NEURONTIN) 100 MG capsule, Take 2 capsules  (200 mg total) by mouth at bedtime.   Multiple Vitamins-Minerals (CENTRUM ADULTS) TABS, Take by mouth.   tiZANidine (ZANAFLEX) 2 MG tablet, Take 1 tablet (2 mg total) by mouth at bedtime as needed for muscle spasms.   Reviewed prior external information including notes and imaging from  primary care provider As well as notes that were available from care everywhere and other healthcare systems.  Past medical history, social, surgical and family history all reviewed in electronic medical record.  No pertanent information unless stated regarding to the chief complaint.   Review of Systems:  No headache, visual changes, nausea, vomiting, diarrhea, constipation, dizziness, abdominal pain, skin rash, fevers, chills, night sweats, weight loss, swollen lymph nodes, joint swelling, chest pain, shortness of breath, mood changes. POSITIVE muscle aches, body aches  Objective  Blood pressure 120/82, pulse 90, height 5' 4"  (1.626 m), weight 192 lb (87.1 kg), last menstrual period 07/24/2018, SpO2 97 %.   General: No apparent distress alert and oriented x3 mood and affect normal, dressed appropriately.  HEENT: Pupils equal, extraocular movements intact  Respiratory: Patient's speak in full sentences and does not appear short of breath  Cardiovascular: No lower extremity edema, non tender, no erythema  Gait mild antalgic MSK: Low back exam.  Patient does have worsening pain with straight leg test.  Patient has more pain with flexion of the back noted as well.  Radicular symptoms but seems to be bilateral with straight leg test to 20 degrees bilaterally.  No weakness noted.  The patient though does seem to be having more pain that is out of proportion.  Difficult doing range of motion secondary to pain.    Impression and Recommendations:     The above documentation has been reviewed and is accurate and complete Lyndal Pulley, DO

## 2021-03-20 ENCOUNTER — Other Ambulatory Visit: Payer: Self-pay

## 2021-03-20 ENCOUNTER — Ambulatory Visit: Payer: BC Managed Care – PPO | Admitting: Family Medicine

## 2021-03-20 ENCOUNTER — Ambulatory Visit (INDEPENDENT_AMBULATORY_CARE_PROVIDER_SITE_OTHER): Payer: BC Managed Care – PPO

## 2021-03-20 VITALS — BP 120/82 | HR 90 | Ht 64.0 in | Wt 192.0 lb

## 2021-03-20 DIAGNOSIS — M545 Low back pain, unspecified: Secondary | ICD-10-CM

## 2021-03-20 DIAGNOSIS — G8929 Other chronic pain: Secondary | ICD-10-CM | POA: Diagnosis not present

## 2021-03-20 IMAGING — DX DG PELVIS 1-2V
1 series · 1 of 1 positions shown · non-contrast
Comparison: [DATE]

CLINICAL DATA: Pelvic pain; Low back pain

EXAM:
PELVIS - 1-2 VIEW; LUMBAR SPINE - 2-3 VIEW

[pelvis ap]
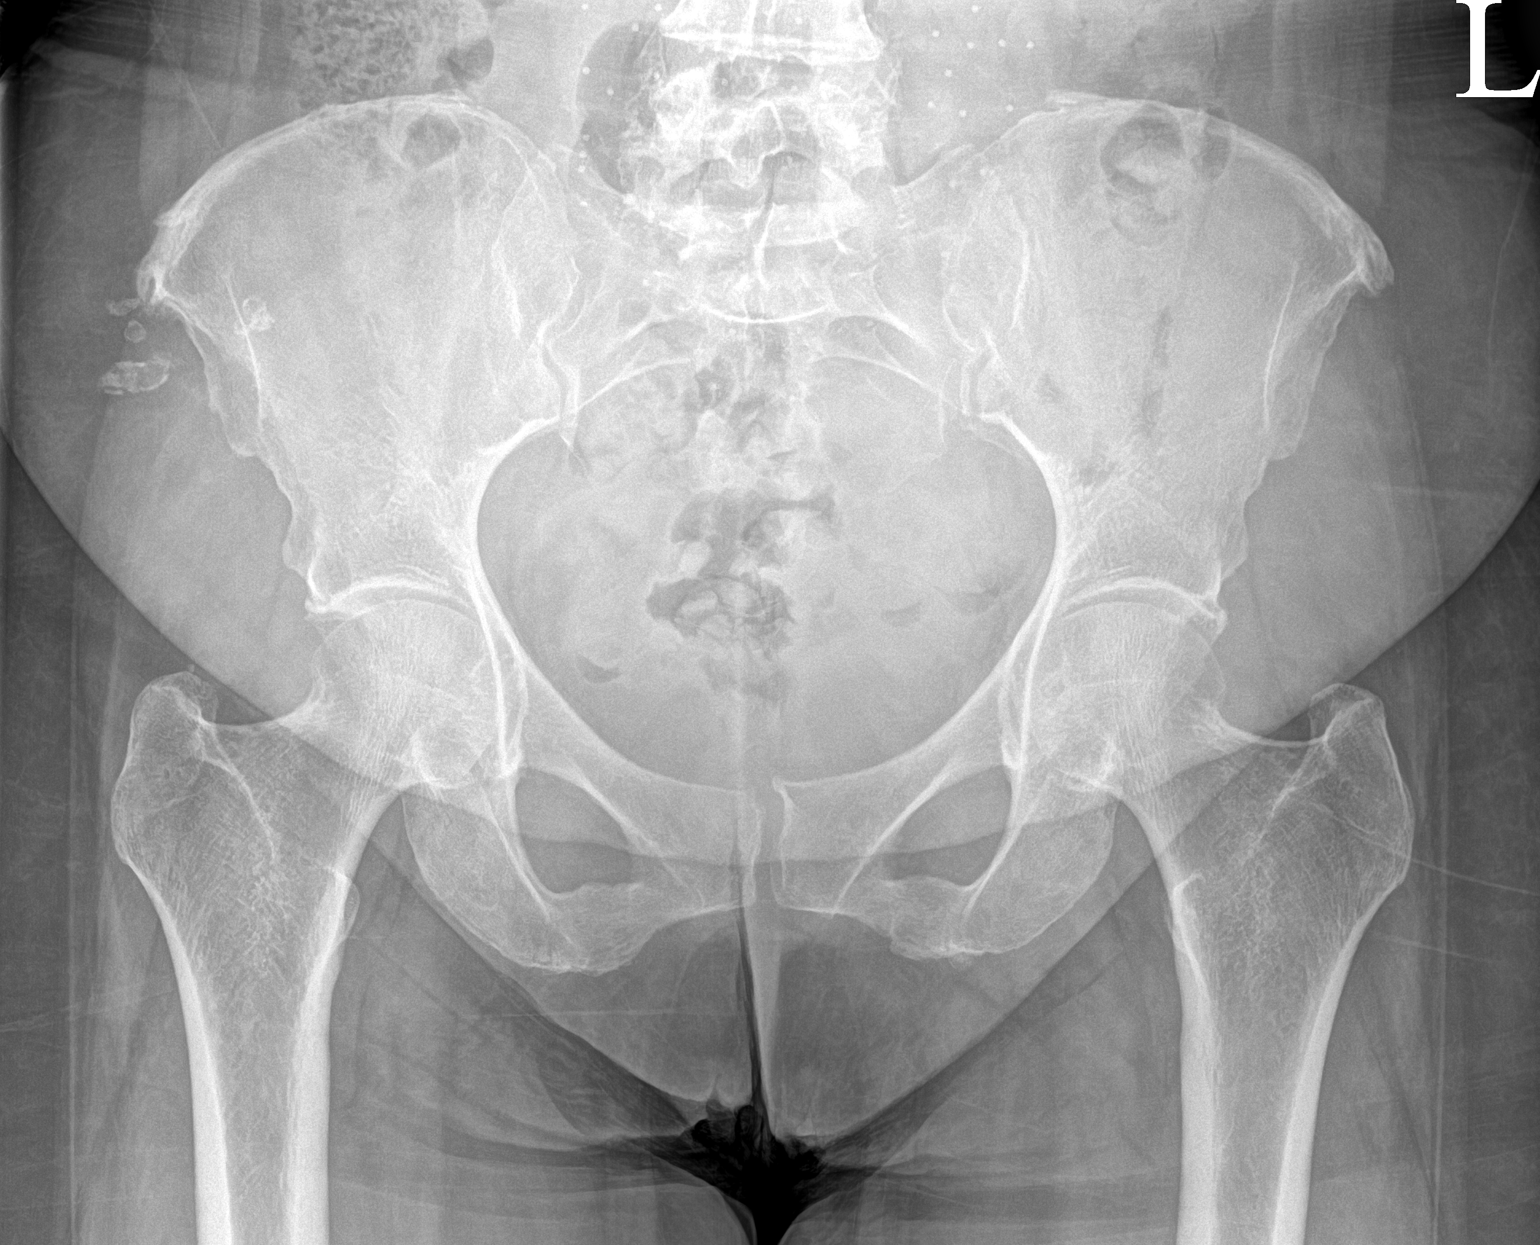

[1 of 1 positions shown; findings below may reference images not displayed]

FINDINGS: Lumbar spine: IVC filter again seen in the right paraspinal region.
Alignment is within normal limits. No significant disc or vertebral
body height loss. Minimal endplate spurring at L2-L3 and L3-L4. Mild
to moderate facet degenerative changes seen throughout the lumbar
spine.

Pelvis: No fracture, dislocation, or soft tissue abnormalities.
IMPRESSION: Mild-to-moderate degenerative changes of the lumbar spine,
predominantly involving the facets.

## 2021-03-20 IMAGING — DX DG LUMBAR SPINE 2-3V
3 series · 3 of 3 positions shown · non-contrast
Comparison: [DATE]

CLINICAL DATA: Pelvic pain; Low back pain

EXAM:
PELVIS - 1-2 VIEW; LUMBAR SPINE - 2-3 VIEW

[l-spine ap]
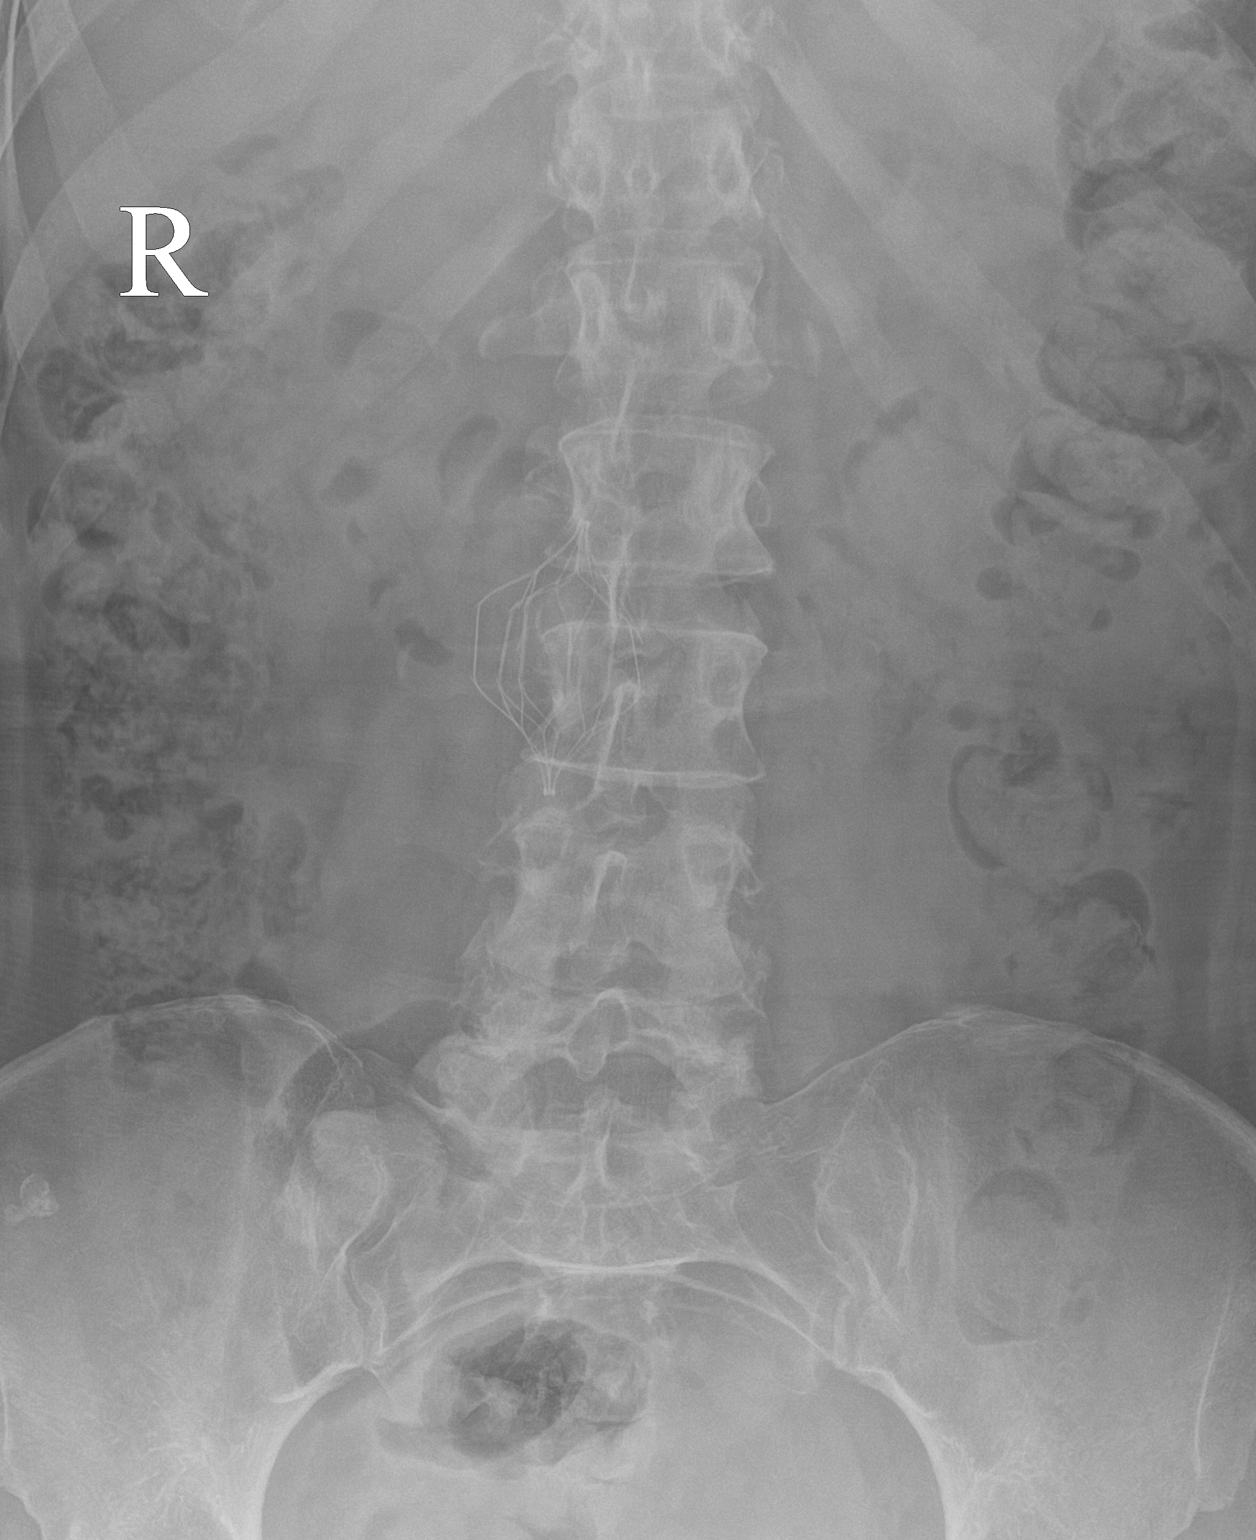

[l-spine lateral]
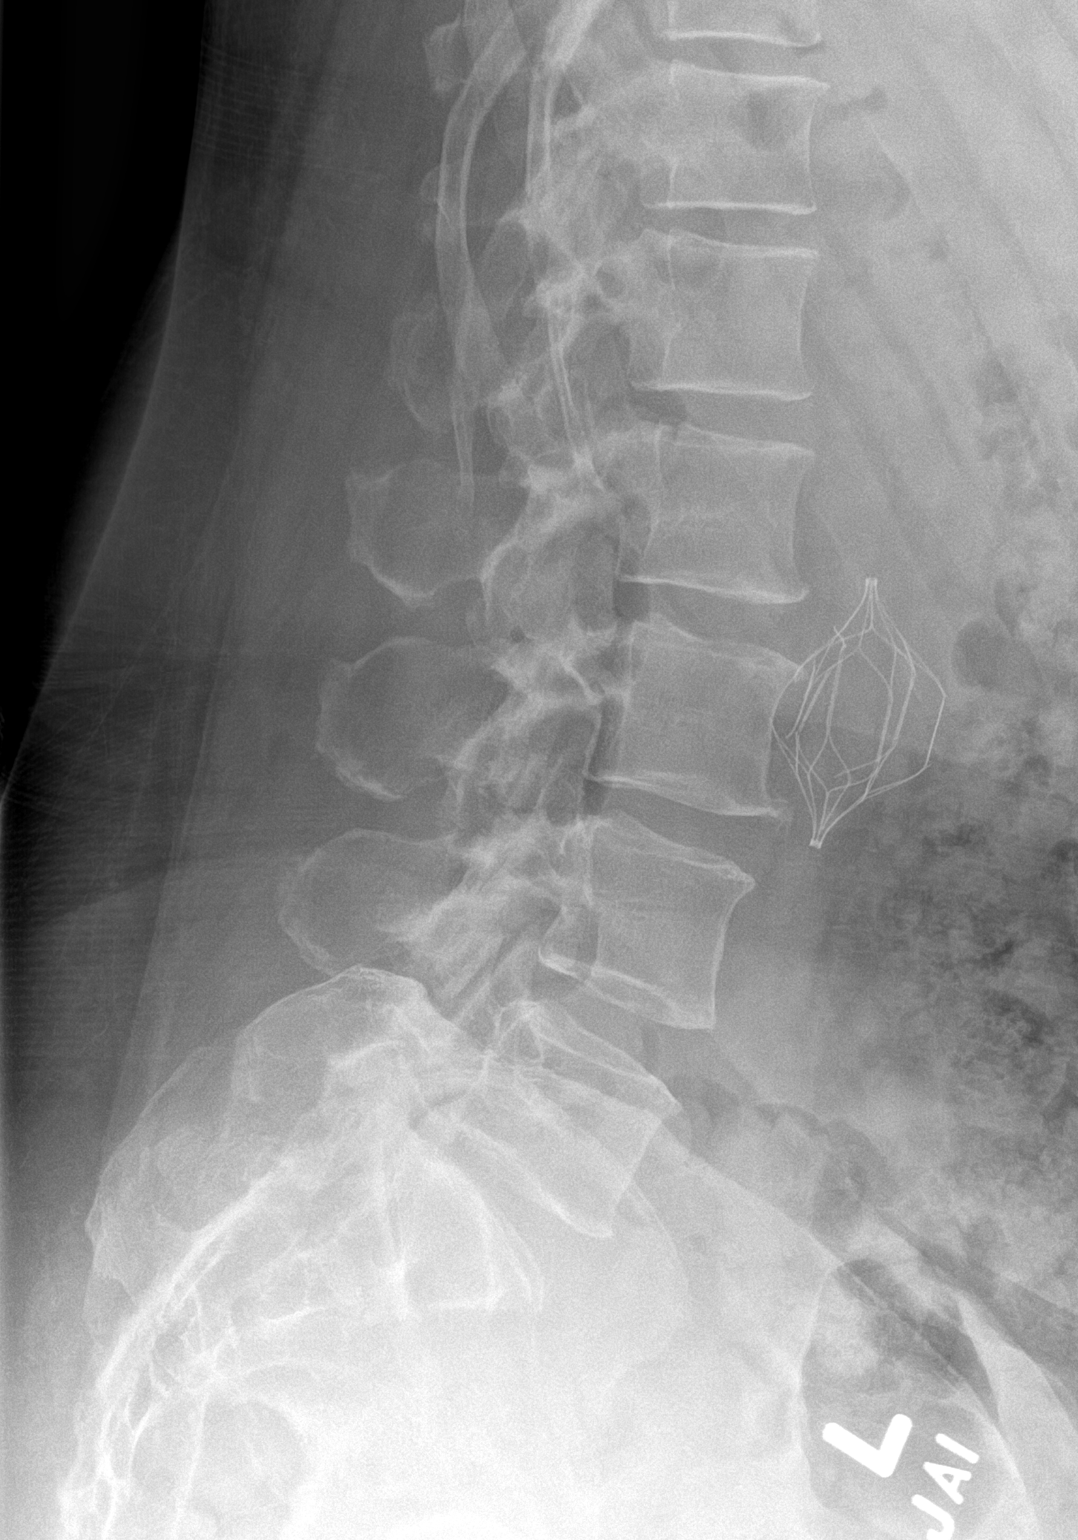

[l-spine spot]
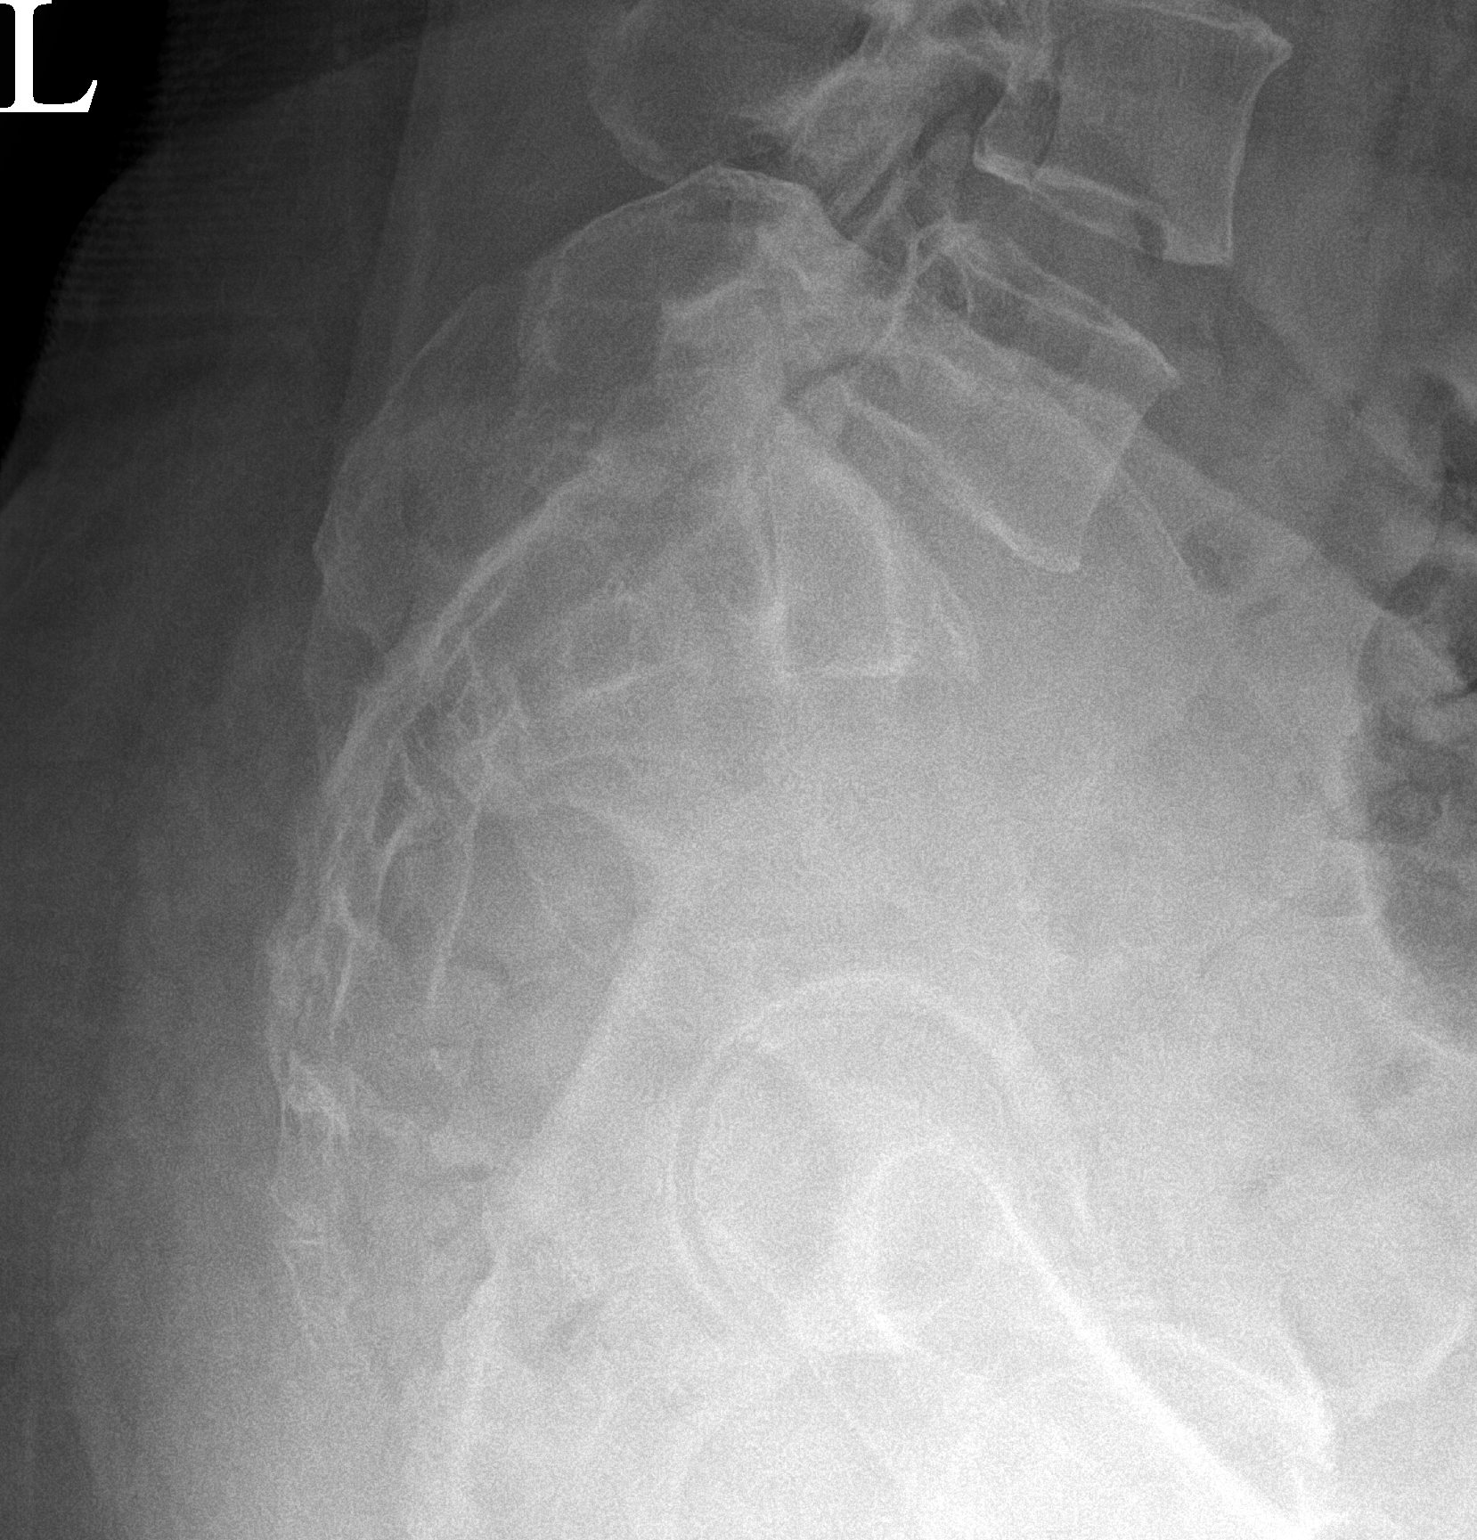

[3 of 3 positions shown; findings below may reference images not displayed]

FINDINGS: Lumbar spine: IVC filter again seen in the right paraspinal region.
Alignment is within normal limits. No significant disc or vertebral
body height loss. Minimal endplate spurring at L2-L3 and L3-L4. Mild
to moderate facet degenerative changes seen throughout the lumbar
spine.

Pelvis: No fracture, dislocation, or soft tissue abnormalities.
IMPRESSION: Mild-to-moderate degenerative changes of the lumbar spine,
predominantly involving the facets.

## 2021-03-20 MED ORDER — GABAPENTIN 100 MG PO CAPS: 200.0000 mg | ORAL_CAPSULE | Freq: Every day | ORAL | 3 refills | Status: DC

## 2021-03-20 MED ORDER — TIZANIDINE HCL 2 MG PO TABS: 2.0000 mg | ORAL_TABLET | Freq: Every evening | ORAL | 0 refills | Status: AC | PRN

## 2021-03-20 NOTE — Assessment & Plan Note (Signed)
Continued chronic pain.  Patient does have having radicular symptoms.  He does seem to have positive straight leg test bilaterally.  No pain.  He Daily activities as well as increasing activity about of work.  Patient is having pain that is now constant with radiation as well down the leg.  At this point I do think advanced imaging would be beneficial especially with patient's other comorbidities.  We will get a lumbar MRI as well as pelvic MRI with patient also having some groin pain as well as having pain filter placed secondary to pulmonary embolism.  Patient will will be leaving country with her mother.  Patient given medications and warned of potential side effects including the gabapentin and the muscle relaxer.  Avoid any anti-inflammatories secondary to patient's allergies with aspirin in the GI upset.  Patient may if having more difficulty.  Follow-up with me after imaging to discuss further treatment such as potential epidurals.  Worsening pain patient knows to seek medical attention immediately

## 2021-03-20 NOTE — Patient Instructions (Addendum)
Good to see you  X-ray on your way out  MRI of Lumbar and pelvis Gabapentin 287m at night start with 1056mto see how it does Zanaflex [id number] if needing it  So sorry for your loss Once you have the MRI we will follow up with the results We will see you once you get back for a follow up

## 2021-03-29 ENCOUNTER — Ambulatory Visit
Admission: RE | Admit: 2021-03-29 | Discharge: 2021-03-29 | Disposition: A | Discharge status: Home / Self Care | Payer: BC Managed Care – PPO | Source: Ambulatory Visit | Attending: Family Medicine | Admitting: Family Medicine

## 2021-03-29 DIAGNOSIS — M545 Low back pain, unspecified: Secondary | ICD-10-CM

## 2021-03-29 IMAGING — MR MR LUMBAR SPINE W/O CM
4 of 5 series · 27 of 48 positions shown · non-contrast
Comparison: None.

CLINICAL DATA: Low back pain radiating to both legs

EXAM:
MRI LUMBAR SPINE WITHOUT CONTRAST
TECHNIQUE: Multiplanar, multisequence MR imaging of the lumbar spine was
performed. No intravenous contrast was administered.

[Series 3: T2 · sagittal · 4.0mm · 1.09mm/px · 6 of 15 slices shown (1 of 2)]
[im 1/15]
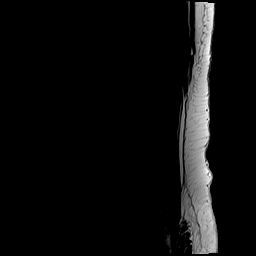
[im 3/15]
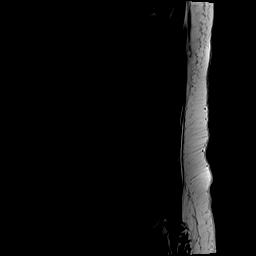
[im 6/15]
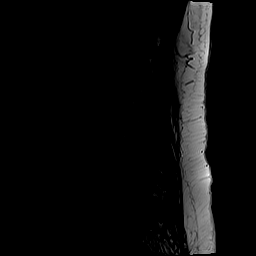
[im 9/15]
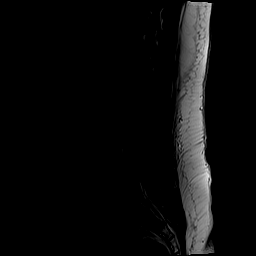
[im 12/15]
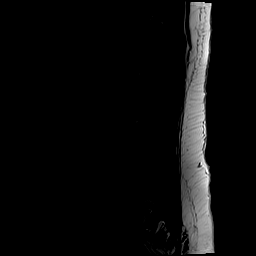
[im 15/15]
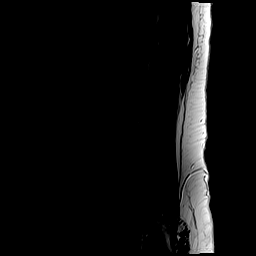

[Series 5: T1 · sagittal · 4.0mm · 1.09mm/px · 5 of 15 slices shown (1 of 2)]
[im 1/15]
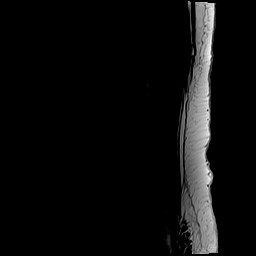
[im 4/15]
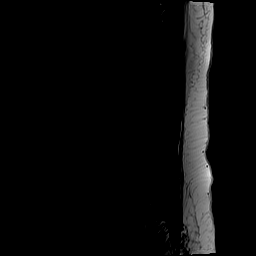
[im 8/15]
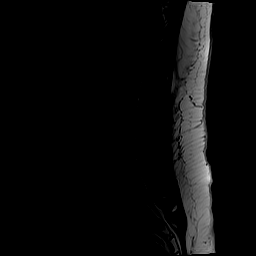
[im 11/15]
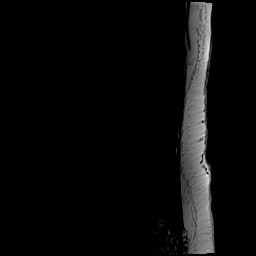
[im 15/15]
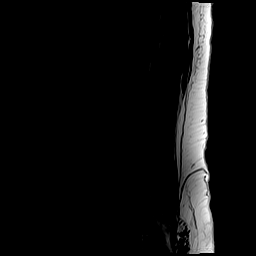

[Series 6: T2 · axial · 4.0mm · 0.39mm/px · z∈[-22,+189]mm · 10 of 45 slices shown (2 of 2)]
[im 3/45]
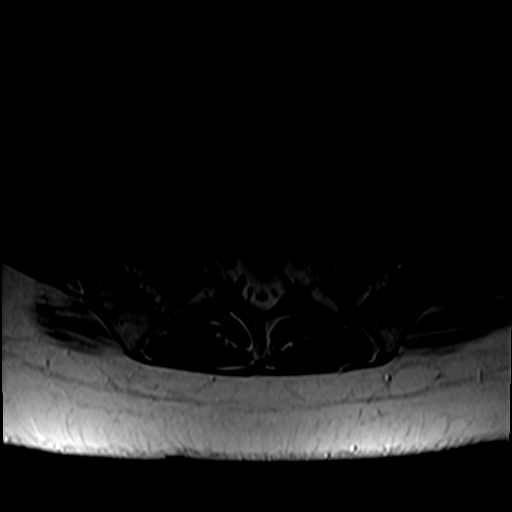
[im 6/45]
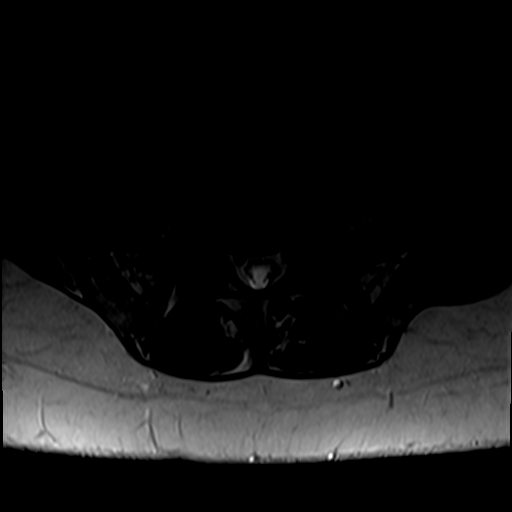
[im 9/45]
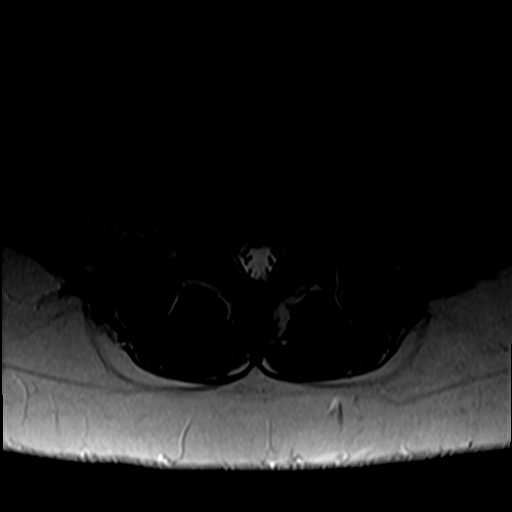
[im 15/45]
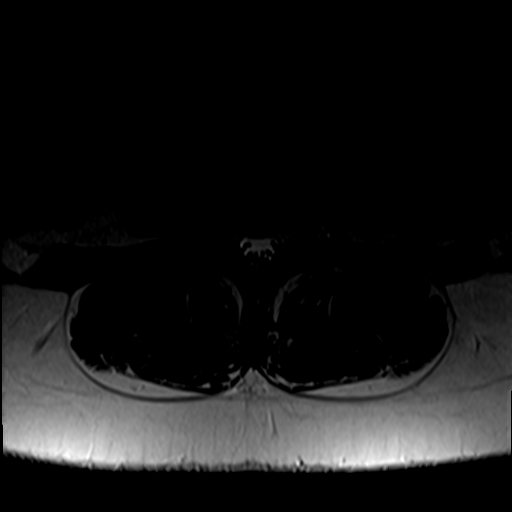
[im 21/45]
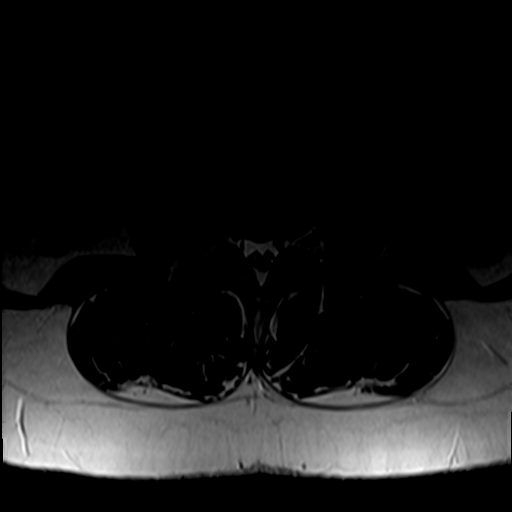
[im 24/45]
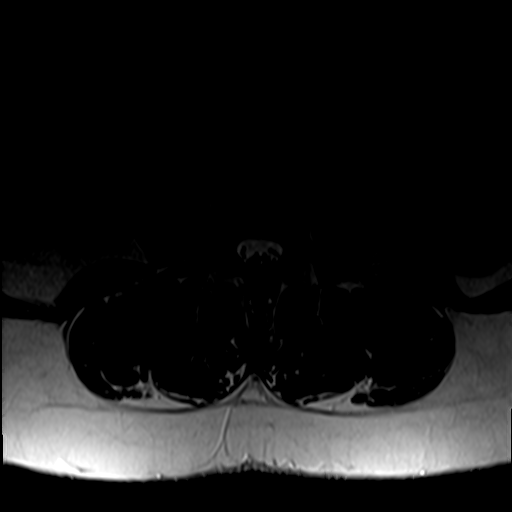
[im 27/45]
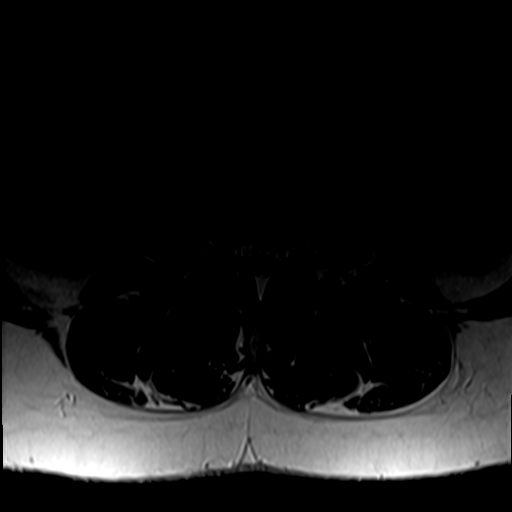
[im 33/45]
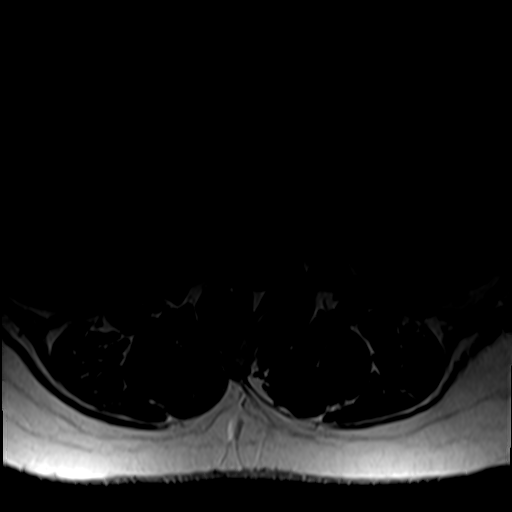
[im 39/45]
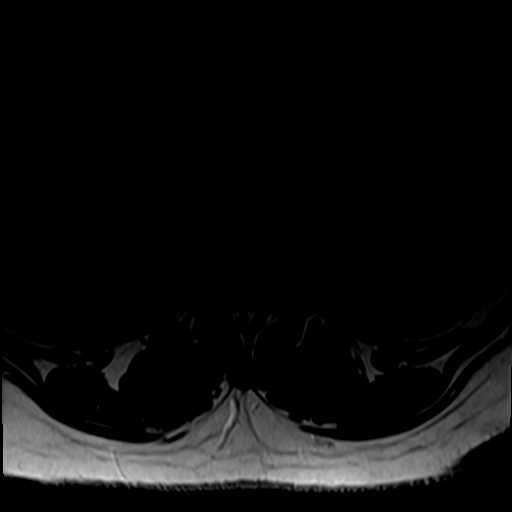
[im 45/45]
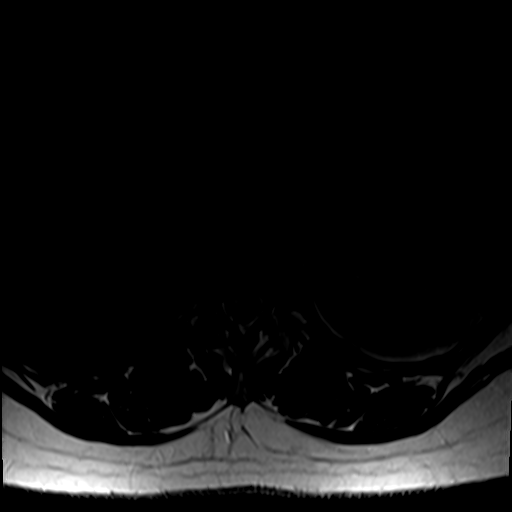

[Series 7: T1 · axial · 4.0mm · 0.39mm/px · z∈[-22,+160]mm · 6 of 45 slices shown (2 of 2)]
[im 3/45]
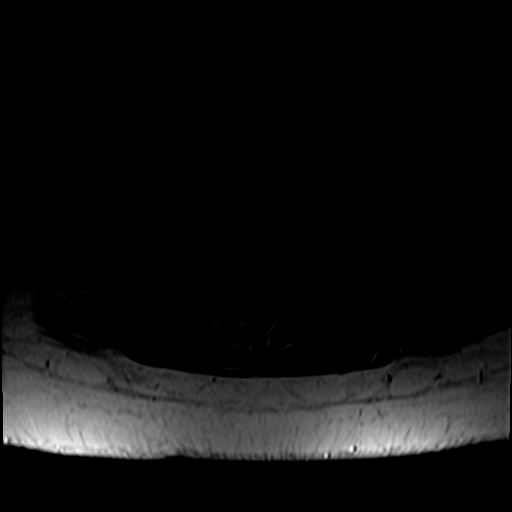
[im 6/45]
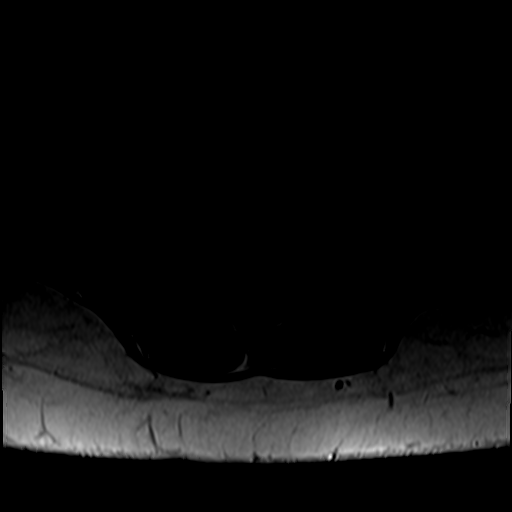
[im 9/45]
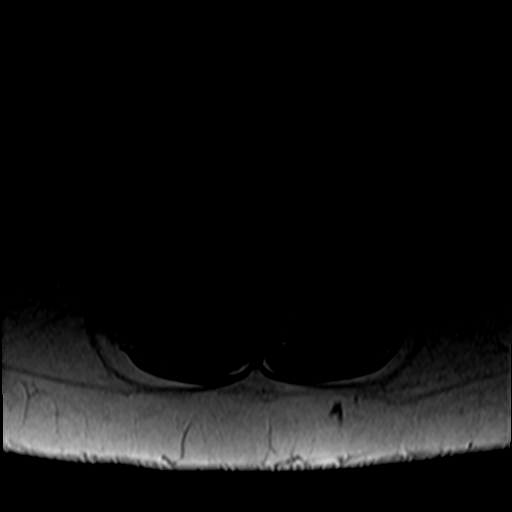
[im 15/45]
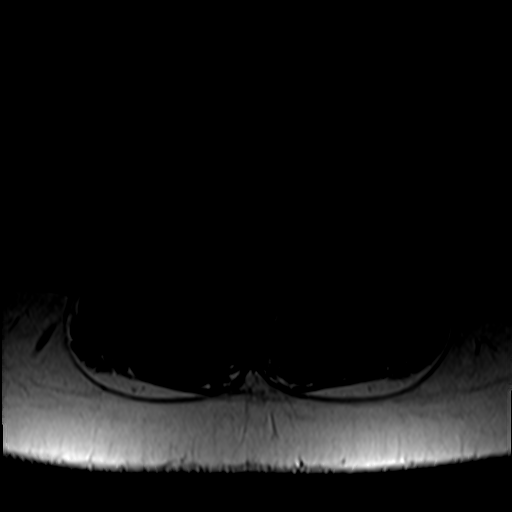
[im 24/45]
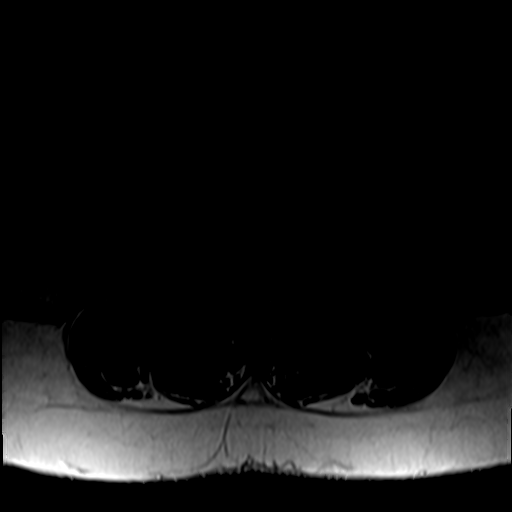
[im 39/45]
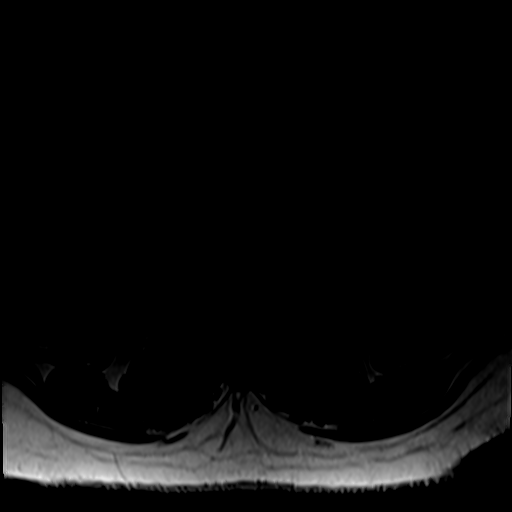

[27 of 48 positions shown; findings below may reference images not displayed]

FINDINGS: Segmentation:  Standard.

Alignment:  Physiologic.

Vertebrae:  No fracture, evidence of discitis, or bone lesion.

Conus medullaris and cauda equina: Conus extends to the L1-2 level.
Conus and cauda equina appear normal.

Paraspinal and other soft tissues: Negative

Disc levels:

The T12-L3 levels are normal.

L3-4: Small central disc protrusion without spinal canal or neural
foraminal stenosis.

L4-5: Mild disc bulge with small central annular fissure. No spinal
canal or neural foraminal stenosis.

L5-S1: Small central disc protrusion. No spinal canal or neural
foraminal stenosis.
IMPRESSION: Mild lower lumbar degenerative disc disease without spinal canal or
neural foraminal stenosis.

## 2021-03-29 IMAGING — MR MR PELVIS W/O CM
4 of 5 series · 28 of 48 positions shown · non-contrast
Comparison: None.

CLINICAL DATA: Low back pain radiating down bilateral legs

EXAM:
MRI PELVIS WITHOUT CONTRAST
TECHNIQUE: Multiplanar multisequence MR imaging of the pelvis was performed. No
intravenous contrast was administered.

[Series 3: T2 fat-sat · sagittal · 4.0mm · 0.51mm/px · 8 of 65 slices shown]
[im 1/65]
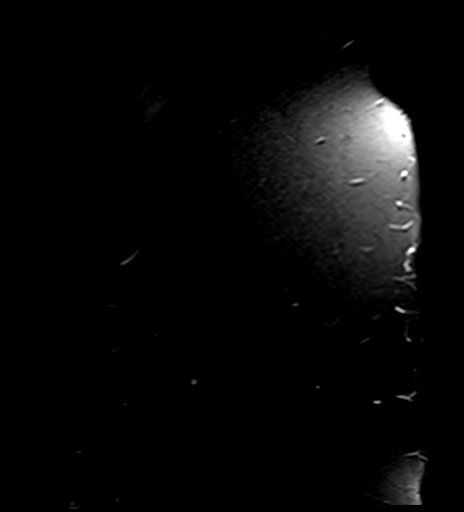
[im 10/65]
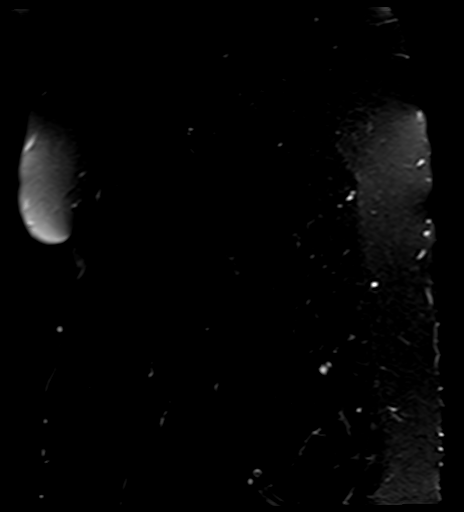
[im 20/65]
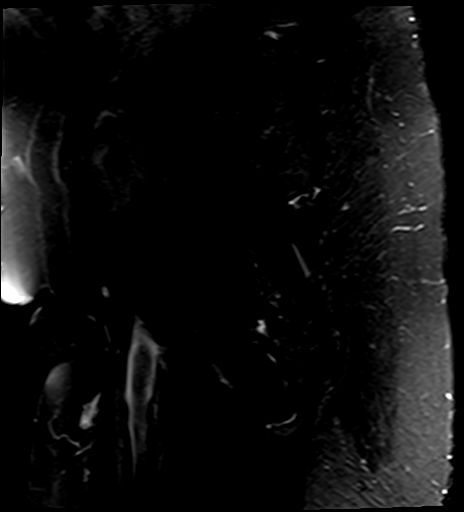
[im 30/65]
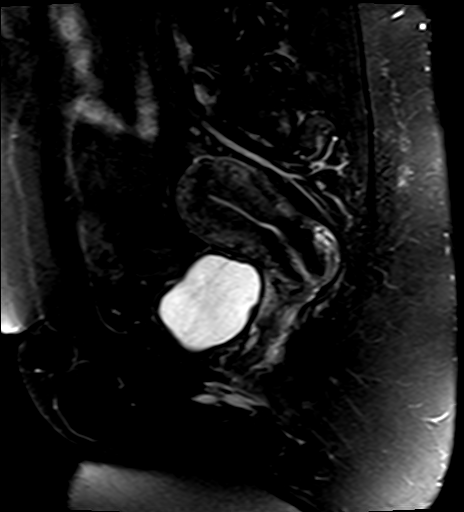
[im 35/65]
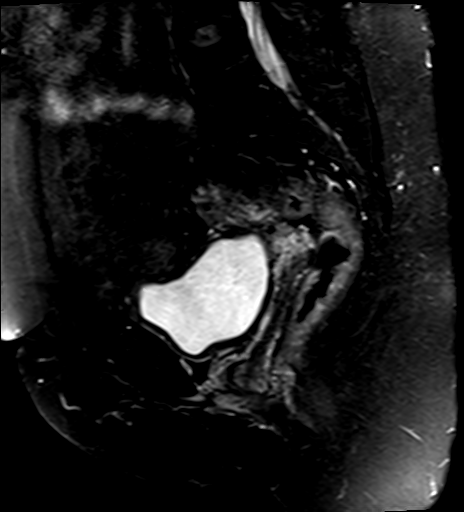
[im 45/65]
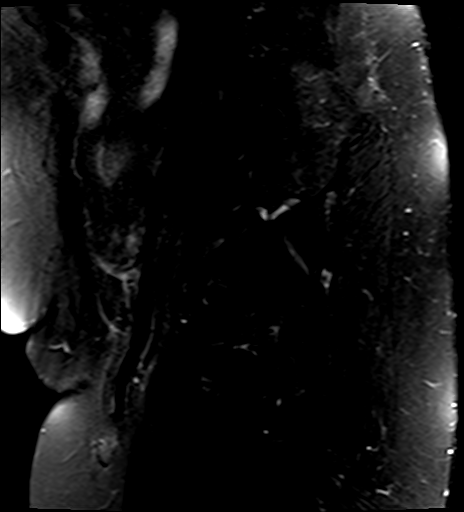
[im 55/65]
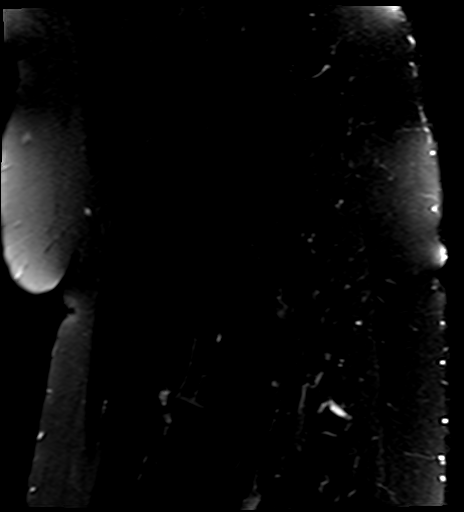
[im 65/65]
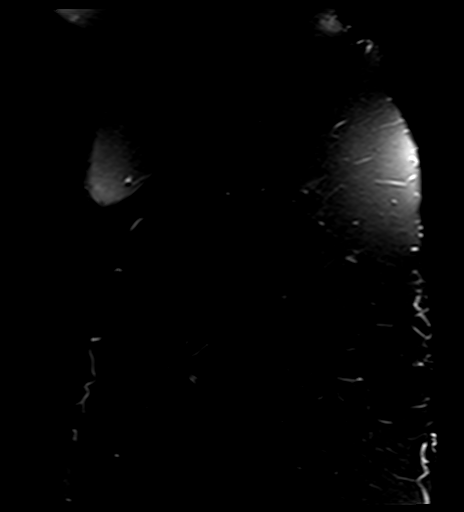

[Series 4: T1 · coronal · 4.0mm · 1.33mm/px · 6 of 26 slices shown (1 of 2)]
[im 1/26]
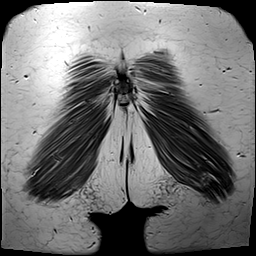
[im 6/26]
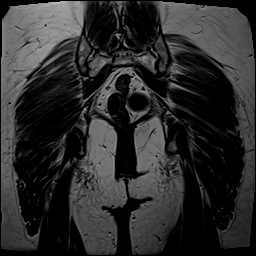
[im 11/26]
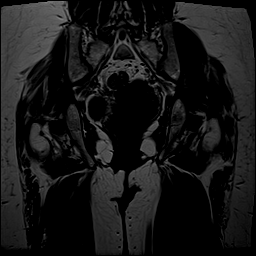
[im 16/26]
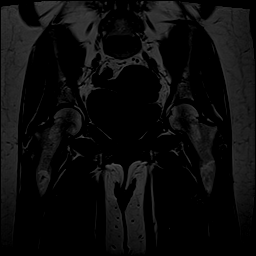
[im 21/26]
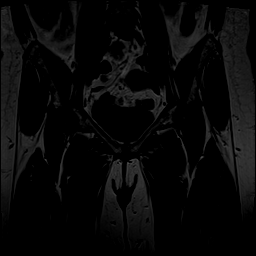
[im 26/26]
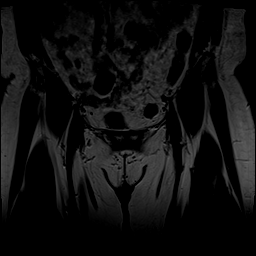

[Series 5: STIR · coronal · 4.0mm · 1.33mm/px · 6 of 29 slices shown]
[im 1/29]
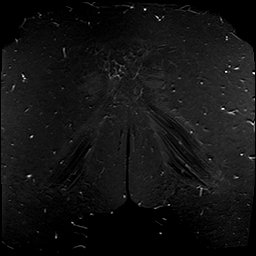
[im 6/29]
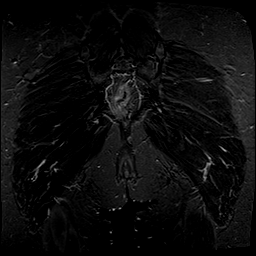
[im 12/29]
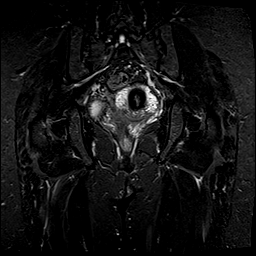
[im 17/29]
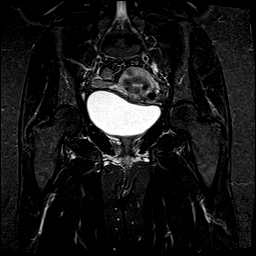
[im 23/29]
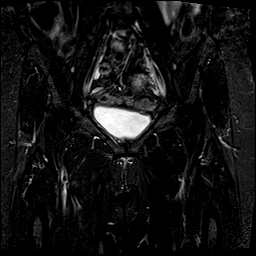
[im 29/29]
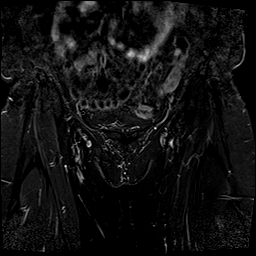

[Series 6: T1 · axial · 4.0mm · 0.62mm/px · z∈[-107,+112]mm · 8 of 50 slices shown (2 of 2)]
[im 1/50]
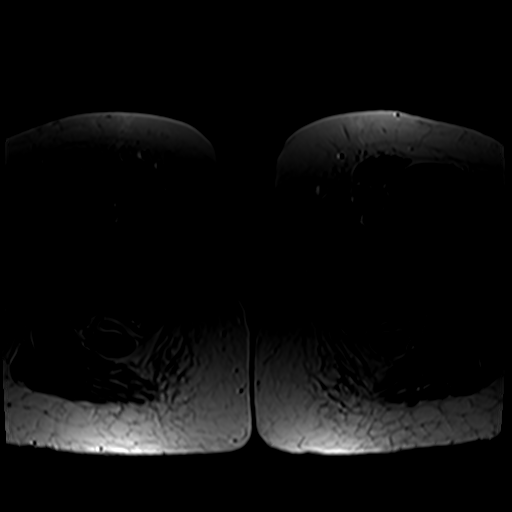
[im 5/50]
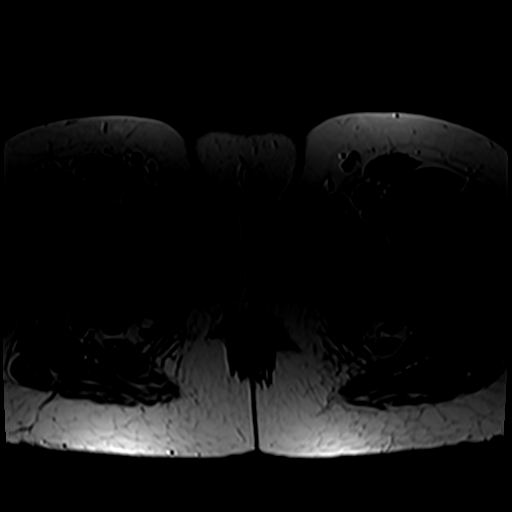
[im 10/50]
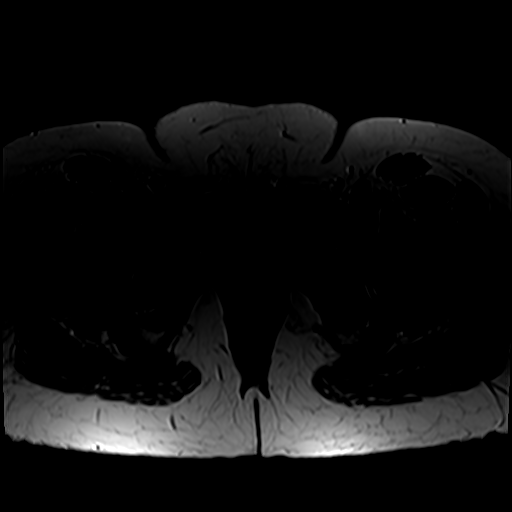
[im 15/50]
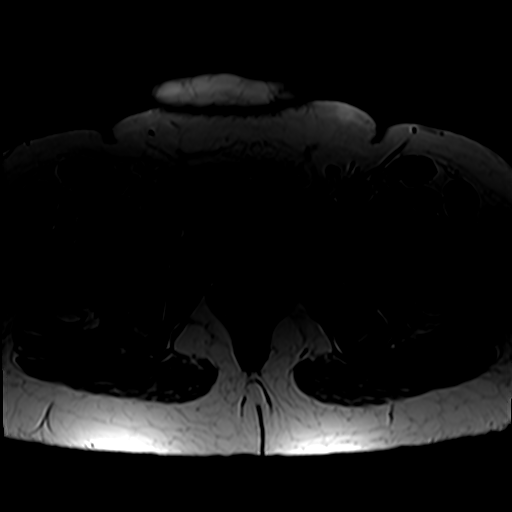
[im 20/50]
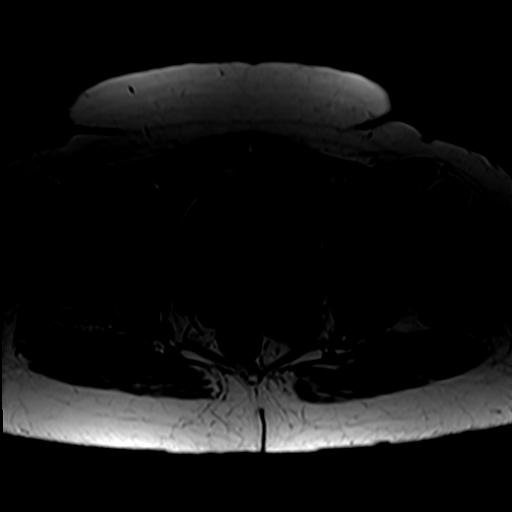
[im 25/50]
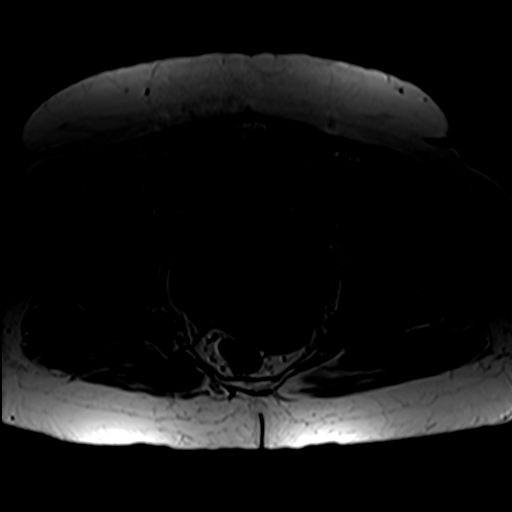
[im 30/50]
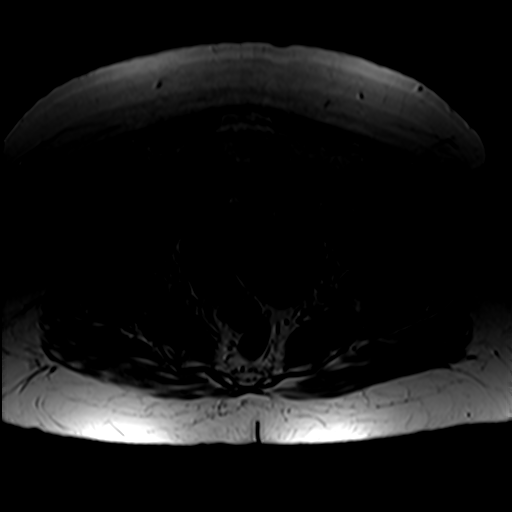
[im 45/50]
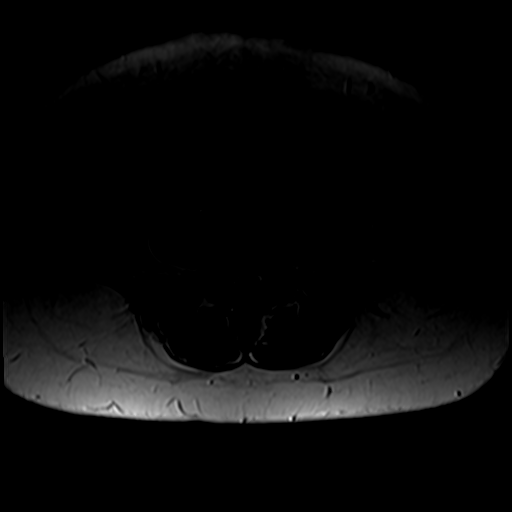

[28 of 48 positions shown; findings below may reference images not displayed]

FINDINGS: Bones:

No hip fracture, dislocation or avascular necrosis.

No periosteal reaction or bone destruction. No aggressive osseous
lesion.

Normal sacrum and sacroiliac joints. No SI joint widening or erosive
changes.

Small central disc protrusion at L5-S1.

Articular cartilage and labrum

Articular cartilage: Mild partial-thickness cartilage loss of
bilateral hips.

Labrum: Grossly intact, but evaluation is limited by lack of
intraarticular fluid.

Joint or bursal effusion

Joint effusion:  No hip joint effusion.  No SI joint effusion.

Bursae:  No bursa formation.

Muscles and tendons

Flexors: Normal.

Extensors: Normal.

Abductors: Normal.

Adductors: Normal.

Gluteals: Normal.

Hamstrings: Normal.

Other findings

No pelvic free fluid. No fluid collection or hematoma. No inguinal
lymphadenopathy. No inguinal hernia. Multiple uterine fibroids with
the largest measuring 2 cm.
IMPRESSION: 1. Mild osteoarthritis of bilateral hips.
2. No acute hip fracture, dislocation or avascular necrosis.
3. Multiple small uterine fibroids.

## 2021-05-29 ENCOUNTER — Encounter: Payer: Self-pay | Admitting: Obstetrics & Gynecology

## 2021-05-29 ENCOUNTER — Other Ambulatory Visit (HOSPITAL_COMMUNITY)
Admission: RE | Admit: 2021-05-29 | Discharge: 2021-05-29 | Disposition: A | Discharge status: Home / Self Care | Payer: BC Managed Care – PPO | Source: Ambulatory Visit | Attending: Obstetrics & Gynecology | Admitting: Obstetrics & Gynecology

## 2021-05-29 ENCOUNTER — Ambulatory Visit: Payer: BC Managed Care – PPO | Admitting: Obstetrics & Gynecology

## 2021-05-29 ENCOUNTER — Other Ambulatory Visit: Payer: Self-pay

## 2021-05-29 VITALS — BP 106/68 | HR 82 | Resp 16 | Ht 66.25 in | Wt 184.0 lb

## 2021-05-29 DIAGNOSIS — Z86711 Personal history of pulmonary embolism: Secondary | ICD-10-CM

## 2021-05-29 DIAGNOSIS — Z113 Encounter for screening for infections with a predominantly sexual mode of transmission: Secondary | ICD-10-CM | POA: Diagnosis not present

## 2021-05-29 DIAGNOSIS — Z23 Encounter for immunization: Secondary | ICD-10-CM

## 2021-05-29 DIAGNOSIS — Z780 Asymptomatic menopausal state: Secondary | ICD-10-CM

## 2021-05-29 DIAGNOSIS — Z01419 Encounter for gynecological examination (general) (routine) without abnormal findings: Secondary | ICD-10-CM | POA: Insufficient documentation

## 2021-05-29 NOTE — Progress Notes (Addendum)
[name] 04/29/1971 [id number]   History:    50 y.o. G3P3L3 Widowed (husband Marquette Saa).  From [place].  RP:  New patient presenting for annual gyn exam   HPI: Postmenopause, with hot flushes and night sweats, but no HRT given h/o PE on Xeralto.  No pelvic pain.  Sexually active with fiance in [place], whom she saw recently while going to a ceremony for her deceased mother in [place].  Breasts normal.  Urine/BMs normal.  BMI 29.47.  Health labs with Fam MD.  No Colono yet.  Past medical history,surgical history, family history and social history were all reviewed and documented in the EPIC chart.  Gynecologic History Patient's last menstrual period was 07/24/2018 (exact date).  Obstetric History OB History  Gravida Para Term Preterm AB Living  4 3 3  0 1 3  SAB IAB Ectopic Multiple Live Births  1 0 0        # Outcome Date GA Lbr Len/2nd Weight Sex Delivery Anes PTL Lv  4 SAB           3 Term      Vag-Spont     2 Term      Vag-Spont     1 Term      Vag-Spont        ROS: A ROS was performed and pertinent positives and negatives are included in the history.  GENERAL: No fevers or chills. HEENT: No change in vision, no earache, sore throat or sinus congestion. NECK: No pain or stiffness. CARDIOVASCULAR: No chest pain or pressure. No palpitations. PULMONARY: No shortness of breath, cough or wheeze. GASTROINTESTINAL: No abdominal pain, nausea, vomiting or diarrhea, melena or bright red blood per rectum. GENITOURINARY: No urinary frequency, urgency, hesitancy or dysuria. MUSCULOSKELETAL: No joint or muscle pain, no back pain, no recent trauma. DERMATOLOGIC: No rash, no itching, no lesions. ENDOCRINE: No polyuria, polydipsia, no heat or cold intolerance. No recent change in weight. HEMATOLOGICAL: No anemia or easy bruising or bleeding. NEUROLOGIC: No headache, seizures, numbness, tingling or weakness. PSYCHIATRIC: No depression, no loss of interest in normal activity or change in sleep  pattern.     Exam:   BP 106/68   Pulse 82   Resp 16   Ht 5' 6.25" (1.683 m)   Wt 184 lb (83.5 kg)   LMP 07/24/2018 (Exact Date)   SpO2 99%   BMI 29.47 kg/m   Body mass index is 29.47 kg/m.  General appearance : Well developed well nourished female. No acute distress HEENT: Eyes: no retinal hemorrhage or exudates,  Neck supple, trachea midline, no carotid bruits, no thyroidmegaly Lungs: Clear to auscultation, no rhonchi or wheezes, or rib retractions  Heart: Regular rate and rhythm, no murmurs or gallops Breast:Examined in sitting and supine position were symmetrical in appearance, no palpable masses or tenderness,  no skin retraction, no nipple inversion, no nipple discharge, no skin discoloration, no axillary or supraclavicular lymphadenopathy Abdomen: no palpable masses or tenderness, no rebound or guarding Extremities: no edema or skin discoloration or tenderness  Pelvic: Vulva: Normal             Vagina: No gross lesions or discharge  Cervix: No gross lesions or discharge.  Pap/HPV HR, Gono-Chlam done.  Uterus  AV, normal size, shape and consistency, non-tender and mobile  Adnexa  Without masses or tenderness  Anus: Normal   Assessment/Plan:  50 y.o. female for annual exam   1. Encounter for routine gynecological examination with Papanicolaou smear  of cervix Postmenopause, with hot flushes and night sweats, but no HRT given h/o PE on Xeralto.  No pelvic pain.  Sexually active with fiance in [place], whom she saw recently while going to a ceremony for her deceased mother in [place].  Pap reflex done today.  Breasts normal.  Urine/BMs normal.  BMI 29.47.  Health labs with Fam MD.  No Colono yet. - Cytology - PAP( [facility])  2. Postmenopause Postmenopause, with hot flushes and night sweats, but no HRT given h/o PE on Xeralto.   3. Screen for STD (sexually transmitted disease) Condoms recommended. - Cytology - PAP( [facility])- Gono-Chlam done - HIV antibody (with  reflex) - RPR - Hepatitis B Surface AntiGEN - Hepatitis C Antibody  4. Hx pulmonary embolism  On Xeralto.  Princess Bruins MD, 11:23 AM 05/29/2021

## 2021-06-01 ENCOUNTER — Telehealth: Payer: Self-pay | Admitting: *Deleted

## 2021-06-01 DIAGNOSIS — Z1211 Encounter for screening for malignant neoplasm of colon: Secondary | ICD-10-CM

## 2021-06-01 LAB — HEPATITIS C ANTIBODY
Hepatitis C Ab: NONREACTIVE
SIGNAL TO CUT-OFF: 0.09 (ref <1.00)

## 2021-06-01 LAB — CYTOLOGY - PAP
Chlamydia: NEGATIVE
Comment: NEGATIVE
Comment: NEGATIVE
Comment: NORMAL
Diagnosis: NEGATIVE
High risk HPV: NEGATIVE
Neisseria Gonorrhea: NEGATIVE

## 2021-06-01 LAB — HEPATITIS B SURFACE ANTIGEN: Hepatitis B Surface Ag: NONREACTIVE

## 2021-06-01 LAB — RPR: RPR Ser Ql: NONREACTIVE

## 2021-06-01 LAB — HIV ANTIBODY (ROUTINE TESTING W REFLEX): HIV 1&2 Ab, 4th Generation: NONREACTIVE

## 2021-06-01 NOTE — Telephone Encounter (Signed)
-----   Message from Princess Bruins, MD sent at 05/29/2021 11:42 AM EDT ----- Regarding: Help patient organize a Colonoscopy with [facility]astro 50 yo screening Colono.

## 2021-06-01 NOTE — Telephone Encounter (Signed)
Referral placed at [facility] they will call to schedule.

## 2021-06-12 NOTE — Telephone Encounter (Signed)
Left message for patient to call to provide her with # to call [facility] [phone] to schedule the below.

## 2021-06-21 ENCOUNTER — Emergency Department (HOSPITAL_COMMUNITY)
Admission: EM | Admit: 2021-06-21 | Discharge: 2021-06-21 | Disposition: A | Discharge status: Home / Self Care | Payer: BC Managed Care – PPO | Attending: Emergency Medicine | Admitting: Emergency Medicine

## 2021-06-21 ENCOUNTER — Emergency Department (HOSPITAL_COMMUNITY): Payer: BC Managed Care – PPO

## 2021-06-21 DIAGNOSIS — M542 Cervicalgia: Secondary | ICD-10-CM | POA: Diagnosis not present

## 2021-06-21 DIAGNOSIS — Z79899 Other long term (current) drug therapy: Secondary | ICD-10-CM | POA: Diagnosis not present

## 2021-06-21 DIAGNOSIS — Z20822 Contact with and (suspected) exposure to covid-19: Secondary | ICD-10-CM | POA: Diagnosis not present

## 2021-06-21 DIAGNOSIS — E119 Type 2 diabetes mellitus without complications: Secondary | ICD-10-CM | POA: Insufficient documentation

## 2021-06-21 DIAGNOSIS — J101 Influenza due to other identified influenza virus with other respiratory manifestations: Secondary | ICD-10-CM | POA: Diagnosis not present

## 2021-06-21 DIAGNOSIS — I10 Essential (primary) hypertension: Secondary | ICD-10-CM | POA: Diagnosis not present

## 2021-06-21 DIAGNOSIS — R519 Headache, unspecified: Secondary | ICD-10-CM | POA: Diagnosis present

## 2021-06-21 LAB — COMPREHENSIVE METABOLIC PANEL
ALT: 25 U/L (ref 0–44)
AST: 30 U/L (ref 15–41)
Albumin: 4.3 g/dL (ref 3.5–5.0)
Alkaline Phosphatase: 65 U/L (ref 38–126)
Anion gap: 7 (ref 5–15)
BUN: 10 mg/dL (ref 6–20)
CO2: 27 mmol/L (ref 22–32)
Calcium: 9.7 mg/dL (ref 8.9–10.3)
Chloride: 104 mmol/L (ref 98–111)
Creatinine, Ser: 0.95 mg/dL (ref 0.44–1.00)
GFR, Estimated: >60 mL/min (ref >60)
Glucose, Bld: 102 mg/dL — ABNORMAL HIGH (ref 70–99)
Potassium: 4.1 mmol/L (ref 3.5–5.1)
Sodium: 138 mmol/L (ref 135–145)
Total Bilirubin: 0.7 mg/dL (ref 0.3–1.2)
Total Protein: 8.9 g/dL — ABNORMAL HIGH (ref 6.5–8.1)

## 2021-06-21 LAB — CBC WITH DIFFERENTIAL/PLATELET
Abs Immature Granulocytes: 0.02 10*3/uL (ref 0.00–0.07)
Basophils Absolute: 0.1 10*3/uL (ref 0.0–0.1)
Basophils Relative: 1 %
Eosinophils Absolute: 0 10*3/uL (ref 0.0–0.5)
Eosinophils Relative: 0 %
HCT: 44.8 % (ref 36.0–46.0)
Hemoglobin: 15.2 g/dL — ABNORMAL HIGH (ref 12.0–15.0)
Immature Granulocytes: 0 %
Lymphocytes Relative: 26 %
Lymphs Abs: 2.1 10*3/uL (ref 0.7–4.0)
MCH: 28.8 pg (ref 26.0–34.0)
MCHC: 33.9 g/dL (ref 30.0–36.0)
MCV: 85 fL (ref 80.0–100.0)
Monocytes Absolute: 1.3 10*3/uL — ABNORMAL HIGH (ref 0.1–1.0)
Monocytes Relative: 16 %
Neutro Abs: 4.6 10*3/uL (ref 1.7–7.7)
Neutrophils Relative %: 57 %
Platelets: 245 10*3/uL (ref 150–400)
RBC: 5.27 MIL/uL — ABNORMAL HIGH (ref 3.87–5.11)
RDW: 14 % (ref 11.5–15.5)
WBC: 8.1 10*3/uL (ref 4.0–10.5)
nRBC: 0 % (ref 0.0–0.2)

## 2021-06-21 LAB — RESP PANEL BY RT-PCR (FLU A&B, COVID) ARPGX2
Influenza A by PCR: POSITIVE — AB
Influenza B by PCR: NEGATIVE
SARS Coronavirus 2 by RT PCR: NEGATIVE

## 2021-06-21 IMAGING — CR DG CHEST 2V
2 series · 2 of 2 positions shown · non-contrast
Comparison: Chest radiograph [DATE]

CLINICAL DATA: Cough.

EXAM:
CHEST - 2 VIEW

[w chest pa]
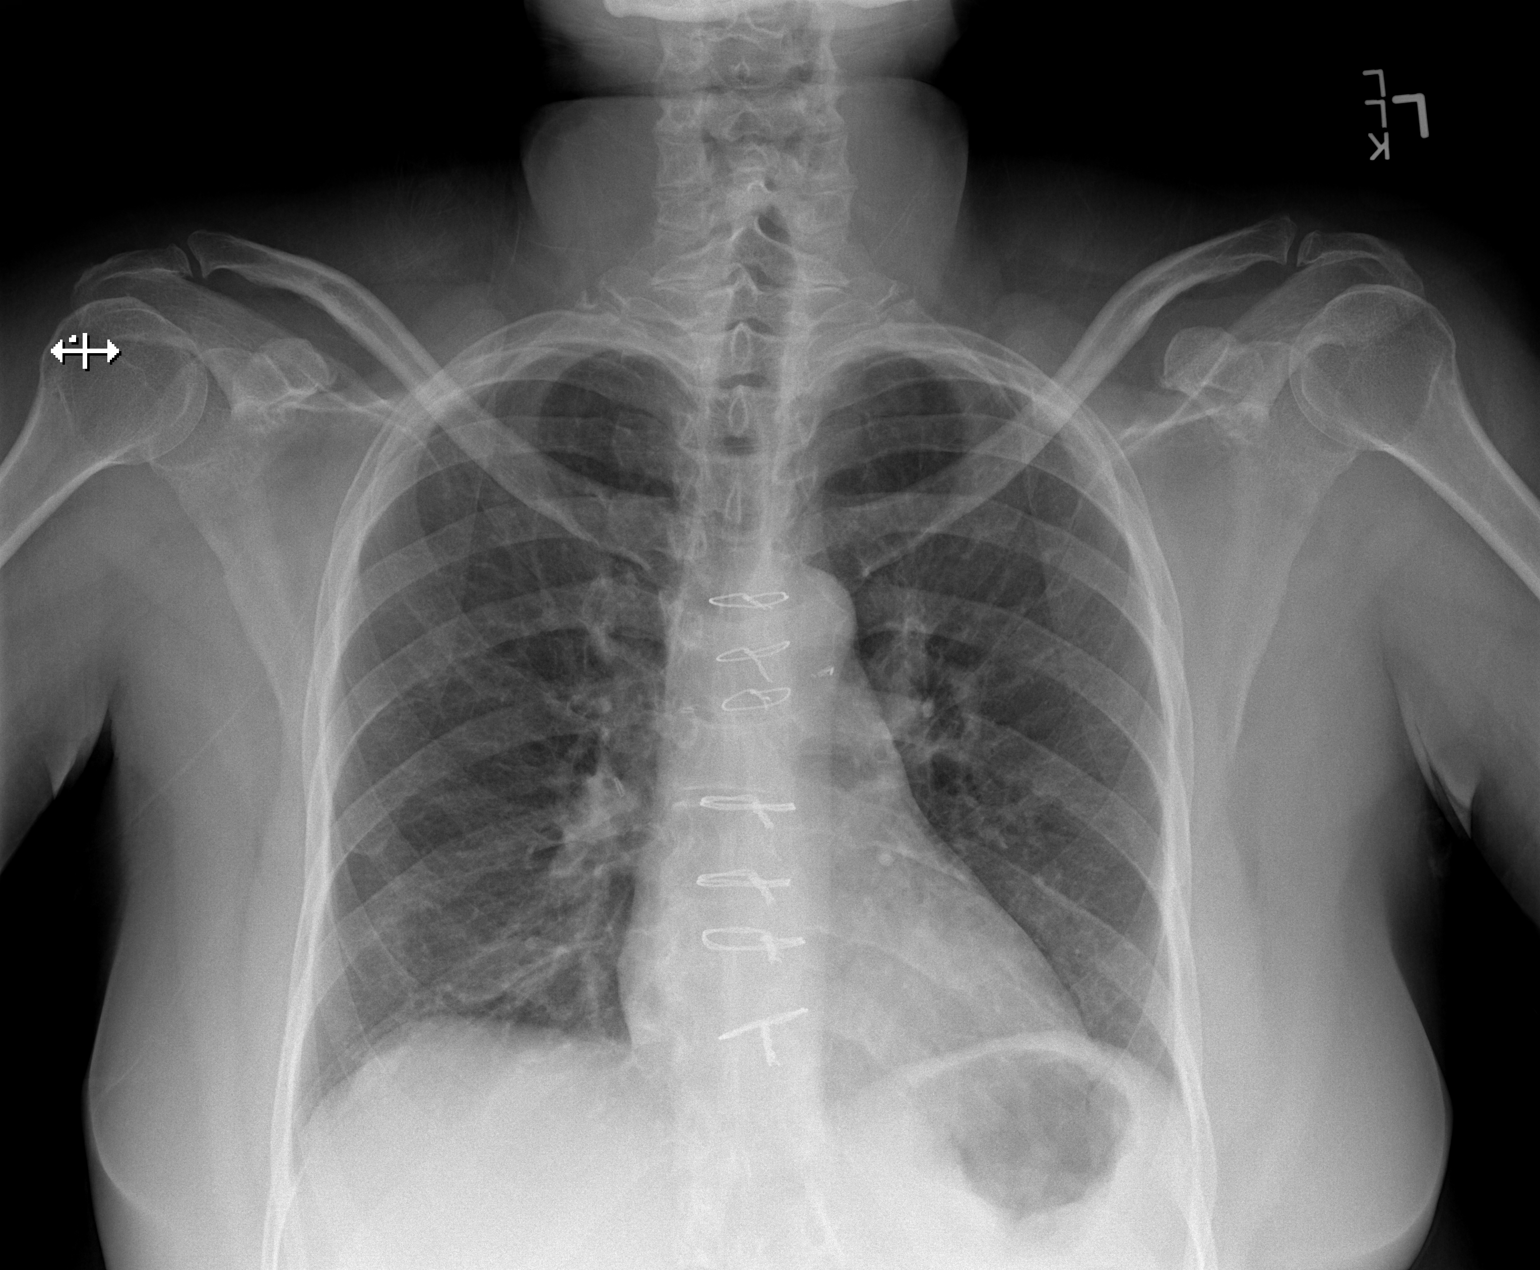

[w chest lat]
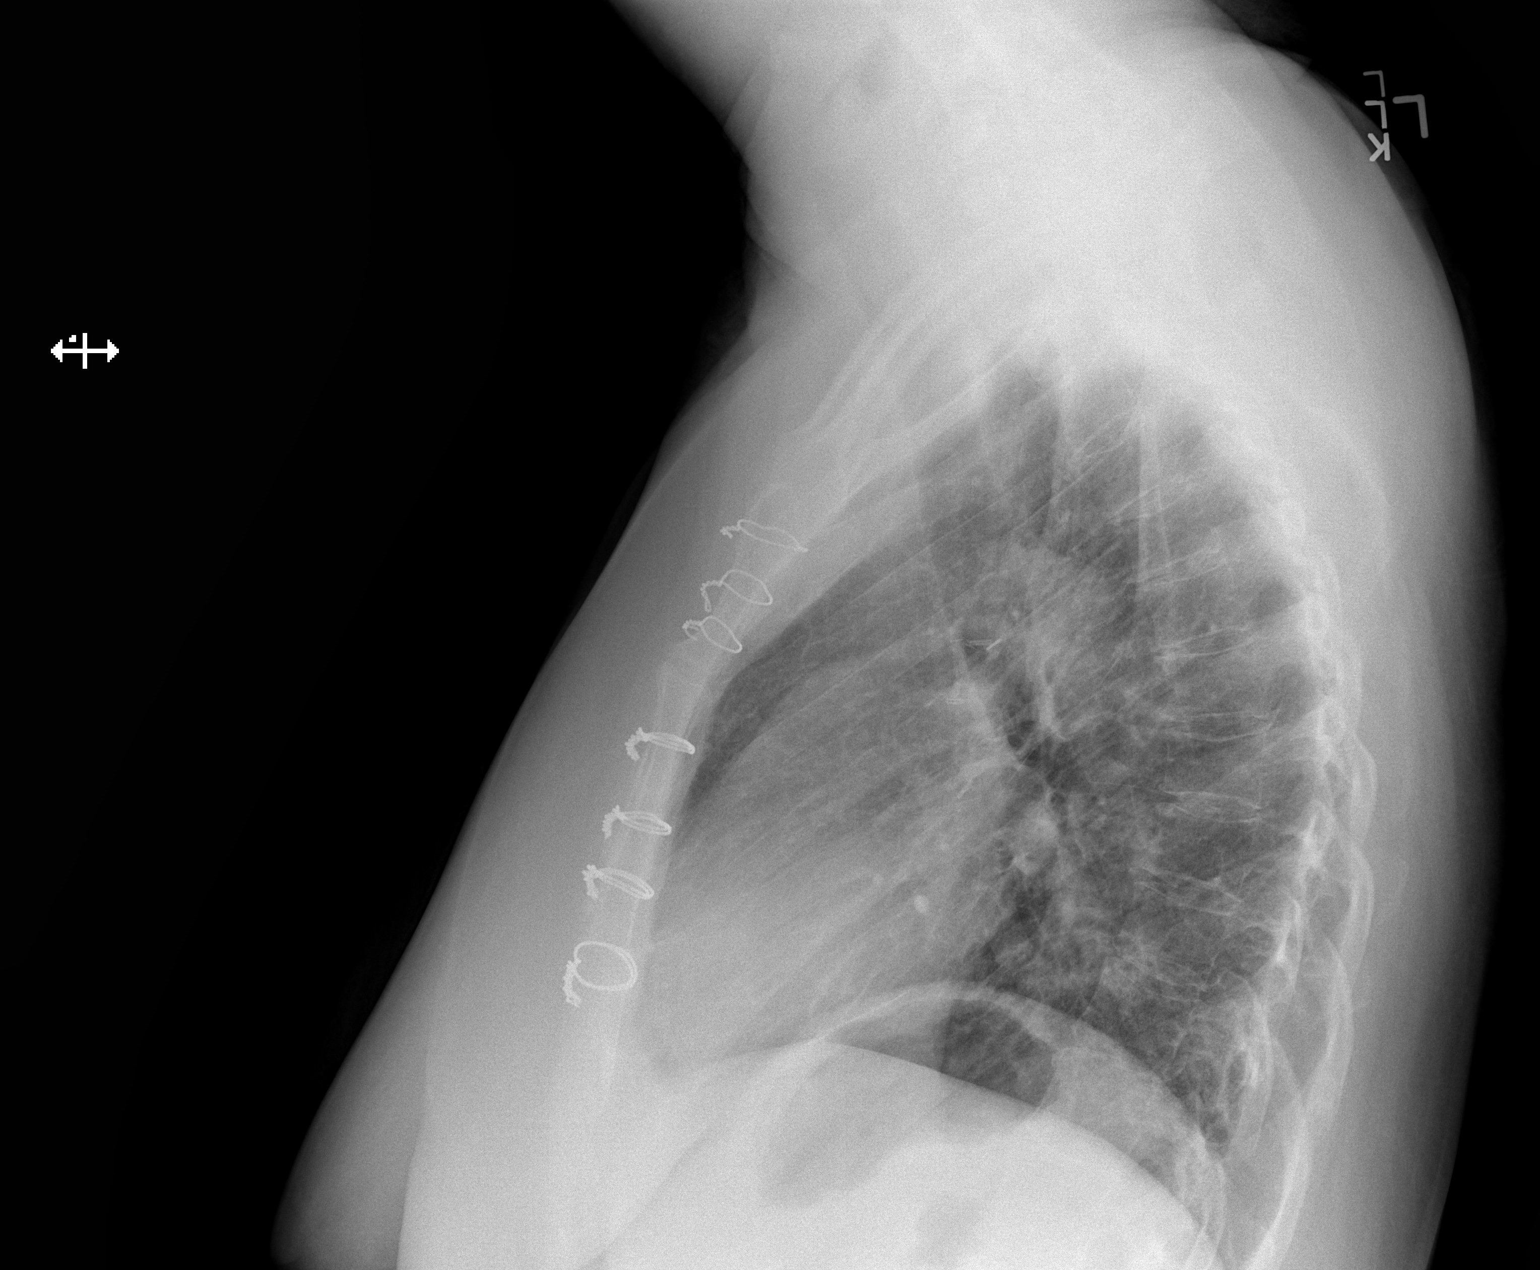

[2 of 2 positions shown; findings below may reference images not displayed]

FINDINGS: Stable cardiomediastinal contours status post median sternotomy. No
new focal consolidation. No pneumothorax or pleural effusion. No
acute finding in the visualized skeleton.
IMPRESSION: No acute cardiopulmonary process.

## 2021-06-21 MED ORDER — OSELTAMIVIR PHOSPHATE 75 MG PO CAPS
75.0000 mg | ORAL_CAPSULE | Freq: Once | ORAL | Status: AC
  Administered 2021-06-21: 75 mg via ORAL
  Filled 2021-06-21: qty 1

## 2021-06-21 MED ORDER — OSELTAMIVIR PHOSPHATE 75 MG PO CAPS: 75.0000 mg | ORAL_CAPSULE | Freq: Two times a day (BID) | ORAL | 0 refills | Status: DC

## 2021-06-21 MED ORDER — ACETAMINOPHEN 500 MG PO TABS
1000.0000 mg | ORAL_TABLET | Freq: Once | ORAL | Status: AC
  Administered 2021-06-21: 1000 mg via ORAL
  Filled 2021-06-21: qty 2

## 2021-06-21 NOTE — ED Triage Notes (Addendum)
Patient reports bp has been going up and down and she has had headache since Friday. Patient says she checks bp at home. Headache rated 8/10 . Denies history of htn, denies use of htn medication

## 2021-06-21 NOTE — ED Provider Notes (Signed)
[facility] DEPT Provider Note   CSN: [id number] Arrival date & time: 06/21/21  1326     History Chief Complaint  Patient presents with   Headache    [name] is a 50 y.o. female history of diabetes, previous PE not on anticoagulation here presenting with headache and neck pain and myalgias for 2 days. Patient states that she just does not feel well and has nonproductive cough.  Patient denies any actual fevers.  Noticed that her blood pressure goes up and down.  Denies any vomiting.  The history is provided by the patient.      Past Medical History:  Diagnosis Date   CTEPH (chronic thromboembolic pulmonary hypertension) (HCC)    Diabetes mellitus without complication ([facility])    DVT (deep venous thrombosis) (HCC)    Elevated cholesterol    Hepatitis B infection    Hep B core Ab 04/2005   Primary hypercoagulable state ([facility])    Pulmonary embolism ([facility]) 08/03/2003       Pulmonary embolism ([facility])    Seasonal allergies     Patient Active Problem List   Diagnosis Date Noted   Travel advice encounter 03/02/2021   Primary hypertension 02/27/2021   Hyperlipidemia with target LDL less than 100 02/26/2021   Type II diabetes mellitus with manifestations ([facility]) 02/26/2021   Encounter for general adult medical examination with abnormal findings 02/24/2021   Screening for cervical cancer 02/24/2021   Need for malaria prophylaxis 02/24/2021   Positional headache 02/24/2021   Chronic midline low back pain 08/17/2018   Mild intermittent asthma without complication 76/19/5093   Iron deficiency anemia 12/18/2015   Prediabetes 06/30/2015   Allergic rhinitis 03/11/2012   Antiphospholipid syndrome ([facility]) 05/24/2011   Hepatitis B infection    CTEPH (chronic thromboembolic pulmonary hypertension) ([facility]) 10/05/2010   Long term current use of anticoagulant 10/05/2010    Past Surgical History:  Procedure Laterality Date   CARDIAC CATHETERIZATION  11/2005    R heart cath : Severe pulmonary arterial HTN with evidence of cor pulmonale. Vasodilatory challenge not performed.   Pulmonary artery endarterectomy  2007   VENA CAVA FILTER PLACEMENT       OB History     Gravida  4   Para  3   Term  3   Preterm  0   AB  1   Living  3      SAB  1   IAB  0   Ectopic  0   Multiple      Live Births              Family History  Problem Relation Age of Onset   Hypertension Mother    Hypertension Father        deceased    Social History   Tobacco Use   Smoking status: Never   Smokeless tobacco: Never  Vaping Use   Vaping Use: Never used  Substance Use Topics   Alcohol use: No    Alcohol/week: 0.0 standard drinks   Drug use: No    Home Medications Prior to Admission medications   Medication Sig Start Date End Date Taking? Authorizing Provider  acetaminophen (TYLENOL) 500 MG tablet Take 500 mg by mouth every 6 (six) hours as needed for headache (pain).    [provider]  gabapentin (NEURONTIN) 100 MG capsule Take 2 capsules (200 mg total) by mouth at bedtime. 03/20/21   Lyndal Pulley, DO  indapamide (LOZOL) 1.25 MG tablet  Take 1 tablet (1.25 mg total) by mouth daily. 02/27/21   Janith Lima, MD  Multiple Vitamins-Minerals (CENTRUM ADULTS) TABS Take by mouth.    [provider]  rivaroxaban (XARELTO) 20 MG TABS tablet TAKE 1 TABLET BY MOUTH ONCE DAILY IN THE MORNING 12/22/20   Chesley Mires, MD  rosuvastatin (CRESTOR) 10 MG tablet Take 1 tablet (10 mg total) by mouth daily. 02/26/21   Janith Lima, MD    Allergies    Aspirin  Review of Systems   Review of Systems  Musculoskeletal:  Positive for myalgias.  Neurological:  Positive for headaches.  All other systems reviewed and are negative.  Physical Exam Updated Vital Signs BP (!) 121/96   Pulse 98   Temp 98.9 F (37.2 C) (Oral)   Resp 18   Ht 5' 4"  (1.626 m)   Wt 83 kg   LMP 07/24/2018 (Exact Date)   SpO2 98%   BMI 31.41 kg/m    Physical Exam Vitals and nursing note reviewed.  Constitutional:      Comments: Slightly uncomfortable  HENT:     Head: Normocephalic.     Mouth/Throat:     Mouth: Mucous membranes are moist.  Eyes:     Extraocular Movements: Extraocular movements intact.  Neck:     Comments: No meningeal signs Cardiovascular:     Rate and Rhythm: Normal rate and regular rhythm.     Heart sounds: Normal heart sounds.  Pulmonary:     Effort: Pulmonary effort is normal.     Breath sounds: Normal breath sounds.  Abdominal:     General: Bowel sounds are normal.     Palpations: Abdomen is soft.  Musculoskeletal:        General: Normal range of motion.     Cervical back: Normal range of motion and neck supple.  Skin:    General: Skin is warm.  Neurological:     Mental Status: She is alert and oriented to person, place, and time.  Psychiatric:        Mood and Affect: Mood normal.        Behavior: Behavior normal.    ED Results / Procedures / Treatments   Labs (all labs ordered are listed, but only abnormal results are displayed) Labs Reviewed  RESP PANEL BY RT-PCR (FLU A&B, COVID) ARPGX2 - Abnormal; Notable for the following components:      Result Value   Influenza A by PCR POSITIVE (*)    All other components within normal limits  COMPREHENSIVE METABOLIC PANEL - Abnormal; Notable for the following components:   Glucose, Bld 102 (*)    Total Protein 8.9 (*)    All other components within normal limits  CBC WITH DIFFERENTIAL/PLATELET - Abnormal; Notable for the following components:   RBC 5.27 (*)    Hemoglobin 15.2 (*)    Monocytes Absolute 1.3 (*)    All other components within normal limits    EKG None  Radiology DG Chest 2 View  Result Date: 06/21/2021 CLINICAL DATA:  Cough. EXAM: CHEST - 2 VIEW COMPARISON:  Chest radiograph 01/23/2021 FINDINGS: Stable cardiomediastinal contours status post median sternotomy. No new focal consolidation. No pneumothorax or pleural effusion.  No acute finding in the visualized skeleton. IMPRESSION: No acute cardiopulmonary process. Electronically Signed   By: Audie Pinto M.D.   On: 06/21/2021 14:38    Procedures Procedures   Medications Ordered in ED Medications  acetaminophen (TYLENOL) tablet 1,000 mg (has no administration in time range)  oseltamivir (TAMIFLU) capsule 75 mg (has no administration in time range)    ED Course  I have reviewed the triage vital signs and the nursing notes.  Pertinent labs & imaging results that were available during my care of the patient were reviewed by me and considered in my medical decision making (see chart for details).    MDM Rules/Calculators/A&P                           [name] is a 50 y.o. female here presenting with headache and neck pain and myalgias.  Patient has no meningeal signs.  Patient is afebrile.  Lungs are clear.  Abdomen is nontender.  Patient tested positive for flu and her CBC is normal and chest x-ray is clear.  She is within the window for Tamiflu.  Patient states that she wants to get Tamiflu.  I gave her strict return precautions.  Final Clinical Impression(s) / ED Diagnoses Final diagnoses:  None    Rx / DC Orders ED Discharge Orders     None        Drenda Freeze, MD 06/21/21 [phone]

## 2021-06-21 NOTE — ED Provider Notes (Signed)
Emergency Medicine Provider Triage Evaluation Note  [name] , a 50 y.o. female  was evaluated in triage.  Pt complains of headache and cough.  This has been going on since Friday.  She also reports that her blood pressure has been going up and down.  Has been taking OTC meds, unsure what they are.  She reports compliance with her medications except for she didn't take them today.  No chest pain or shortness of breath.   Review of Systems  Positive: Headache, body aches, cough Negative: Syncope, chest pain  Physical Exam  BP (!) 134/95 (BP Location: Left Arm)   Pulse (!) 102   Temp 98.9 F (37.2 C) (Oral)   Resp 16   Ht 5' 4"  (1.626 m)   Wt 83 kg   LMP 07/24/2018 (Exact Date)   SpO2 97%   BMI 31.41 kg/m  Gen:   Awake, no distress   Resp:  Normal effort  MSK:   Moves extremities without difficulty  Other:  Lungs CTAB.   Medical Decision Making  Medically screening exam initiated at 2:08 PM.  Appropriate orders placed.  Amazin T Boy was informed that the remainder of the evaluation will be completed by another provider, this initial triage assessment does not replace that evaluation, and the importance of remaining in the ED until their evaluation is complete.  Note: Portions of this report may have been transcribed using voice recognition software. Every effort was made to ensure accuracy; however, inadvertent computerized transcription errors may be present    Ollen Gross 06/21/21 1420    Carmin Muskrat, MD 06/21/21 [phone]

## 2021-06-21 NOTE — Discharge Instructions (Signed)
You have influenza A  Please take Tamiflu as prescribed.  Take Tylenol or Motrin for fever or chills  You are expected to be running fevers for about 5 to 7 days  Please get some rest at home this week  Return to ER if you have worse headaches, neck pain, persistent fever, vomiting, abdominal pain

## 2021-08-04 ENCOUNTER — Other Ambulatory Visit: Payer: Self-pay

## 2021-08-04 ENCOUNTER — Ambulatory Visit: Payer: BC Managed Care – PPO | Admitting: Internal Medicine

## 2021-08-04 ENCOUNTER — Encounter: Payer: Self-pay | Admitting: Internal Medicine

## 2021-08-04 VITALS — BP 132/86 | HR 77 | Temp 98.3°F | Ht 64.0 in | Wt 181.0 lb

## 2021-08-04 DIAGNOSIS — E785 Hyperlipidemia, unspecified: Secondary | ICD-10-CM

## 2021-08-04 DIAGNOSIS — E118 Type 2 diabetes mellitus with unspecified complications: Secondary | ICD-10-CM

## 2021-08-04 DIAGNOSIS — I739 Peripheral vascular disease, unspecified: Secondary | ICD-10-CM | POA: Diagnosis not present

## 2021-08-04 DIAGNOSIS — I10 Essential (primary) hypertension: Secondary | ICD-10-CM

## 2021-08-04 DIAGNOSIS — M5136 Other intervertebral disc degeneration, lumbar region: Secondary | ICD-10-CM | POA: Diagnosis not present

## 2021-08-04 DIAGNOSIS — Z124 Encounter for screening for malignant neoplasm of cervix: Secondary | ICD-10-CM

## 2021-08-04 DIAGNOSIS — Z23 Encounter for immunization: Secondary | ICD-10-CM | POA: Diagnosis not present

## 2021-08-04 LAB — URINALYSIS, ROUTINE W REFLEX MICROSCOPIC
Bilirubin Urine: NEGATIVE
Hgb urine dipstick: NEGATIVE
Ketones, ur: NEGATIVE
Leukocytes,Ua: NEGATIVE
Nitrite: NEGATIVE
RBC / HPF: NONE SEEN (ref 0–?)
Specific Gravity, Urine: 1.015 (ref 1.000–1.030)
Total Protein, Urine: NEGATIVE
Urine Glucose: NEGATIVE
Urobilinogen, UA: 0.2 (ref 0.0–1.0)
pH: 6 (ref 5.0–8.0)

## 2021-08-04 LAB — MICROALBUMIN / CREATININE URINE RATIO
Creatinine,U: 52.8 mg/dL
Microalb Creat Ratio: 1.3 mg/g (ref 0.0–30.0)
Microalb, Ur: <0.7 mg/dL (ref 0.0–1.9)

## 2021-08-04 LAB — HEMOGLOBIN A1C: Hgb A1c MFr Bld: 6.3 % (ref 4.6–6.5)

## 2021-08-04 MED ORDER — ROSUVASTATIN CALCIUM 10 MG PO TABS: 10.0000 mg | ORAL_TABLET | Freq: Every day | ORAL | 1 refills | Status: DC

## 2021-08-04 MED ORDER — CELECOXIB 100 MG PO CAPS: 100.0000 mg | ORAL_CAPSULE | Freq: Every day | ORAL | 1 refills | Status: DC

## 2021-08-04 NOTE — Patient Instructions (Signed)

## 2021-08-04 NOTE — Progress Notes (Signed)
Subjective:  Patient ID: [name], female    DOB: 07-10-1971  Age: 51 y.o. MRN: [id number]  CC: Diabetes, Hypertension, and Back Pain  This visit occurred during the SARS-CoV-2 public health emergency.  Safety protocols were in place, including screening questions prior to the visit, additional usage of staff PPE, and extensive cleaning of exam room while observing appropriate contact time as indicated for disinfecting solutions.    HPI [name] presents for f/up -   She complains of chronic, nonradiating low back pain.  She denies any recent trauma or injury.  She denies lower extremity paresthesias.  She is not taking anything for pain.  She would like to try physical therapy.  Outpatient Medications Prior to Visit  Medication Sig Dispense Refill   acetaminophen (TYLENOL) 500 MG tablet Take 500 mg by mouth every 6 (six) hours as needed for headache (pain).     gabapentin (NEURONTIN) 100 MG capsule Take 2 capsules (200 mg total) by mouth at bedtime. 60 capsule 3   indapamide (LOZOL) 1.25 MG tablet Take 1 tablet (1.25 mg total) by mouth daily. 90 tablet 0   Multiple Vitamins-Minerals (CENTRUM ADULTS) TABS Take by mouth.     rivaroxaban (XARELTO) 20 MG TABS tablet TAKE 1 TABLET BY MOUTH ONCE DAILY IN THE MORNING 90 tablet 3   oseltamivir (TAMIFLU) 75 MG capsule Take 1 capsule (75 mg total) by mouth every 12 (twelve) hours. 10 capsule 0   rosuvastatin (CRESTOR) 10 MG tablet Take 1 tablet (10 mg total) by mouth daily. 90 tablet 1   No facility-administered medications prior to visit.    ROS Review of Systems  Constitutional:  Negative for chills, diaphoresis, fatigue and fever.  HENT: Negative.    Eyes:  Negative for visual disturbance.  Respiratory:  Negative for cough, chest tightness, shortness of breath and wheezing.   Cardiovascular:  Negative for chest pain, palpitations and leg swelling.  Gastrointestinal:  Negative for abdominal pain, constipation, diarrhea,  nausea and vomiting.  Endocrine: Negative.   Genitourinary: Negative.  Negative for difficulty urinating.  Musculoskeletal:  Positive for back pain. Negative for arthralgias, myalgias and neck pain.  Skin: Negative.   Neurological: Negative.  Negative for dizziness, weakness, light-headedness and headaches.  Hematological:  Negative for adenopathy. Does not bruise/bleed easily.  Psychiatric/Behavioral: Negative.     Objective:  BP 132/86 (BP Location: Left Arm, Patient Position: Sitting, Cuff Size: Large)    Pulse 77    Temp 98.3 F (36.8 C) (Oral)    Ht 5' 4"  (1.626 m)    Wt 181 lb (82.1 kg)    LMP 07/24/2018 (Exact Date)    SpO2 99%    BMI 31.07 kg/m   BP Readings from Last 3 Encounters:  08/04/21 132/86  06/21/21 (!) 125/101  05/29/21 106/68    Wt Readings from Last 3 Encounters:  08/04/21 181 lb (82.1 kg)  06/21/21 183 lb (83 kg)  05/29/21 184 lb (83.5 kg)    Physical Exam Vitals reviewed.  Constitutional:      Appearance: Normal appearance.  HENT:     Nose: Nose normal.     Mouth/Throat:     Mouth: Mucous membranes are moist.  Eyes:     General: No scleral icterus.    Conjunctiva/sclera: Conjunctivae normal.  Cardiovascular:     Rate and Rhythm: Normal rate and regular rhythm.     Heart sounds: No murmur heard. Pulmonary:     Effort: Pulmonary effort is normal.  Breath sounds: No stridor. No wheezing, rhonchi or rales.  Abdominal:     General: Abdomen is flat.     Palpations: There is no mass.     Tenderness: There is no abdominal tenderness. There is no guarding.     Hernia: No hernia is present.  Musculoskeletal:        General: Normal range of motion.     Cervical back: Normal and neck supple.     Thoracic back: Normal.     Lumbar back: Normal. No tenderness or bony tenderness. Normal range of motion. Negative right straight leg raise test and negative left straight leg raise test.     Right lower leg: No edema.     Left lower leg: No edema.   Lymphadenopathy:     Cervical: No cervical adenopathy.  Skin:    General: Skin is warm and dry.     Coloration: Skin is not pale.  Neurological:     General: No focal deficit present.     Mental Status: She is alert and oriented to person, place, and time.     Deep Tendon Reflexes: Reflexes normal.  Psychiatric:        Mood and Affect: Mood normal.    Lab Results  Component Value Date   WBC 8.1 06/21/2021   HGB 15.2 (H) 06/21/2021   HCT 44.8 06/21/2021   PLT 245 06/21/2021   GLUCOSE 102 (H) 06/21/2021   CHOL 194 02/24/2021   TRIG 120.0 02/24/2021   HDL 41.80 02/24/2021   LDLCALC 129 (H) 02/24/2021   ALT 25 06/21/2021   AST 30 06/21/2021   NA 138 06/21/2021   K 4.1 06/21/2021   CL 104 06/21/2021   CREATININE 0.95 06/21/2021   BUN 10 06/21/2021   CO2 27 06/21/2021   TSH 1.43 06/30/2015   INR 2.80 03/20/2012   HGBA1C 6.3 08/04/2021   MICROALBUR <0.7 08/04/2021    DG Chest 2 View  Result Date: 06/21/2021 CLINICAL DATA:  Cough. EXAM: CHEST - 2 VIEW COMPARISON:  Chest radiograph 01/23/2021 FINDINGS: Stable cardiomediastinal contours status post median sternotomy. No new focal consolidation. No pneumothorax or pleural effusion. No acute finding in the visualized skeleton. IMPRESSION: No acute cardiopulmonary process. Electronically Signed   By: Audie Pinto M.D.   On: 06/21/2021 14:38    Assessment & Plan:   Maloni was seen today for diabetes, hypertension and back pain.  Diagnoses and all orders for this visit:  Primary hypertension- Her BP is well controlled. -     Urinalysis, Routine w reflex microscopic; Future -     Urinalysis, Routine w reflex microscopic  Type II diabetes mellitus with manifestations ([place])- Her blood sugar is well controlled. -     Microalbumin / creatinine urine ratio; Future -     Hemoglobin A1c; Future -     HM Diabetes Foot Exam -     Hemoglobin A1c -     Microalbumin / creatinine urine ratio  DDD (degenerative disc disease),  lumbar- Will treat with a COX-2 inh and PT. -     celecoxib (CELEBREX) 100 MG capsule; Take 1 capsule (100 mg total) by mouth daily. -     Ambulatory referral to Physical Therapy  Claudication [facility])- Will screen for PAD. -     VAS [place] ABI WITH/WO TBI; Future  Hyperlipidemia with target LDL less than 100- LDL goal achieved. Doing well on the statin  -     rosuvastatin (CRESTOR) 10 MG tablet; Take 1  tablet (10 mg total) by mouth daily.  Need for vaccination -     Pneumococcal conjugate vaccine 20-valent  Screening for cervical cancer   I have discontinued Chauntae T. Pates's oseltamivir. I am also having her start on celecoxib. Additionally, I am having her maintain her acetaminophen, Centrum Adults, rivaroxaban, indapamide, gabapentin, and rosuvastatin.  Meds ordered this encounter  Medications   celecoxib (CELEBREX) 100 MG capsule    Sig: Take 1 capsule (100 mg total) by mouth daily.    Dispense:  90 capsule    Refill:  1   rosuvastatin (CRESTOR) 10 MG tablet    Sig: Take 1 tablet (10 mg total) by mouth daily.    Dispense:  90 tablet    Refill:  1     Follow-up: Return in about 6 months (around 02/01/2022).  Scarlette Calico, MD

## 2021-09-09 ENCOUNTER — Encounter: Payer: Self-pay | Admitting: Obstetrics & Gynecology

## 2021-11-10 NOTE — Telephone Encounter (Signed)
NOTE NOT NEEDED ?

## 2021-11-13 ENCOUNTER — Encounter (HOSPITAL_COMMUNITY): Payer: Self-pay

## 2021-11-13 ENCOUNTER — Other Ambulatory Visit: Payer: Self-pay

## 2021-11-13 ENCOUNTER — Emergency Department (HOSPITAL_COMMUNITY)
Admission: EM | Admit: 2021-11-13 | Discharge: 2021-11-14 | Disposition: A | Discharge status: Home / Self Care | Payer: BC Managed Care – PPO | Attending: Emergency Medicine | Admitting: Emergency Medicine

## 2021-11-13 DIAGNOSIS — Z7901 Long term (current) use of anticoagulants: Secondary | ICD-10-CM | POA: Diagnosis not present

## 2021-11-13 DIAGNOSIS — M79601 Pain in right arm: Secondary | ICD-10-CM | POA: Insufficient documentation

## 2021-11-13 DIAGNOSIS — R112 Nausea with vomiting, unspecified: Secondary | ICD-10-CM | POA: Insufficient documentation

## 2021-11-13 DIAGNOSIS — R079 Chest pain, unspecified: Secondary | ICD-10-CM

## 2021-11-13 DIAGNOSIS — J9811 Atelectasis: Secondary | ICD-10-CM | POA: Insufficient documentation

## 2021-11-13 DIAGNOSIS — E119 Type 2 diabetes mellitus without complications: Secondary | ICD-10-CM | POA: Insufficient documentation

## 2021-11-13 DIAGNOSIS — R519 Headache, unspecified: Secondary | ICD-10-CM | POA: Diagnosis not present

## 2021-11-13 DIAGNOSIS — M79602 Pain in left arm: Secondary | ICD-10-CM

## 2021-11-13 DIAGNOSIS — R109 Unspecified abdominal pain: Secondary | ICD-10-CM | POA: Diagnosis not present

## 2021-11-13 NOTE — ED Triage Notes (Signed)
Pt presents to ED from home, states she developed left arm pain 3-4 days ago and it is now radiating to the center of her chest. Denies SOB, endorses headache and dizziness.  ?

## 2021-11-14 ENCOUNTER — Emergency Department (HOSPITAL_COMMUNITY): Payer: BC Managed Care – PPO

## 2021-11-14 LAB — BASIC METABOLIC PANEL
Anion gap: 5 (ref 5–15)
BUN: 19 mg/dL (ref 6–20)
CO2: 27 mmol/L (ref 22–32)
Calcium: 9.5 mg/dL (ref 8.9–10.3)
Chloride: 108 mmol/L (ref 98–111)
Creatinine, Ser: 0.89 mg/dL (ref 0.44–1.00)
GFR, Estimated: >60 mL/min (ref >60)
Glucose, Bld: 109 mg/dL — ABNORMAL HIGH (ref 70–99)
Potassium: 4.4 mmol/L (ref 3.5–5.1)
Sodium: 140 mmol/L (ref 135–145)

## 2021-11-14 LAB — CBC
HCT: 44.1 % (ref 36.0–46.0)
Hemoglobin: 15 g/dL (ref 12.0–15.0)
MCH: 29.2 pg (ref 26.0–34.0)
MCHC: 34 g/dL (ref 30.0–36.0)
MCV: 86 fL (ref 80.0–100.0)
Platelets: 234 10*3/uL (ref 150–400)
RBC: 5.13 MIL/uL — ABNORMAL HIGH (ref 3.87–5.11)
RDW: 13.6 % (ref 11.5–15.5)
WBC: 4.6 10*3/uL (ref 4.0–10.5)
nRBC: 0 % (ref 0.0–0.2)

## 2021-11-14 LAB — TROPONIN I (HIGH SENSITIVITY)
Troponin I (High Sensitivity): 2 ng/L (ref <18)
Troponin I (High Sensitivity): 2 ng/L (ref <18)

## 2021-11-14 IMAGING — CT CT ANGIO CHEST
2 of 6 series · 18 of 36 positions shown · IV contrast (agent unspecified)
Comparison: Portable chest [UO] hours today. Chest CTA [DATE].

CLINICAL DATA: 50-year-old female with chest and left arm pain.
Headache and dizziness.

EXAM:
CT ANGIOGRAPHY CHEST WITH CONTRAST
TECHNIQUE: Multidetector CT imaging of the chest was performed using the
standard protocol during bolus administration of intravenous
contrast. Multiplanar CT image reconstructions and MIPs were
obtained to evaluate the vascular anatomy.

[Series 5: thins · axial · 0.62mm/px · z∈[-166,+68]mm · 17 of 264 slices shown]
[im 15/264  lung]
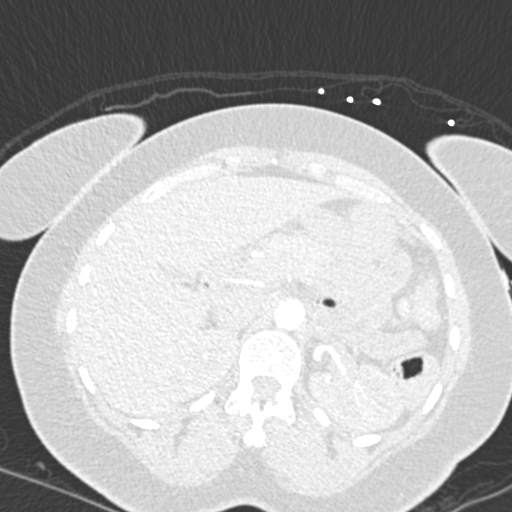
[im 30/264  mediastinal]
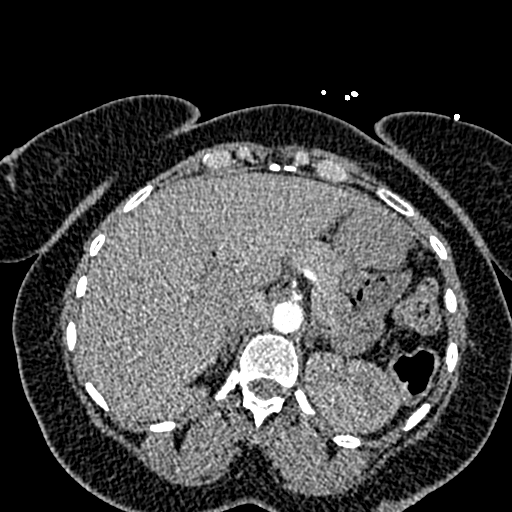
[im 44/264  lung]
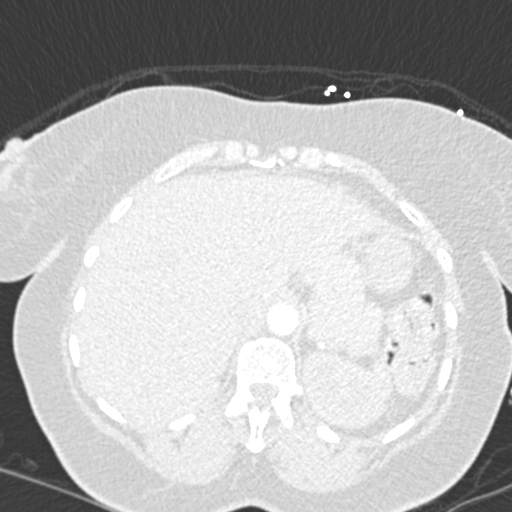
[im 59/264  mediastinal]
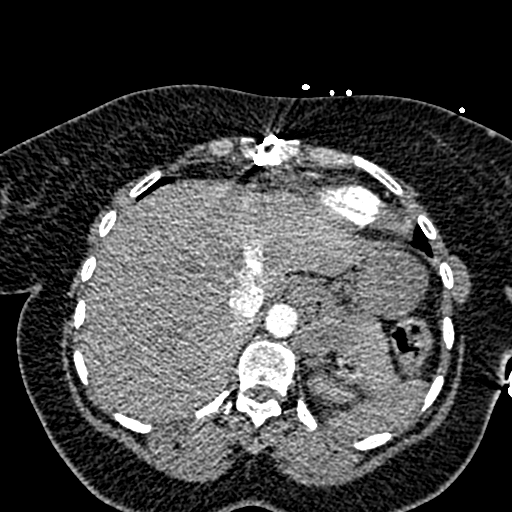
[im 74/264  lung]
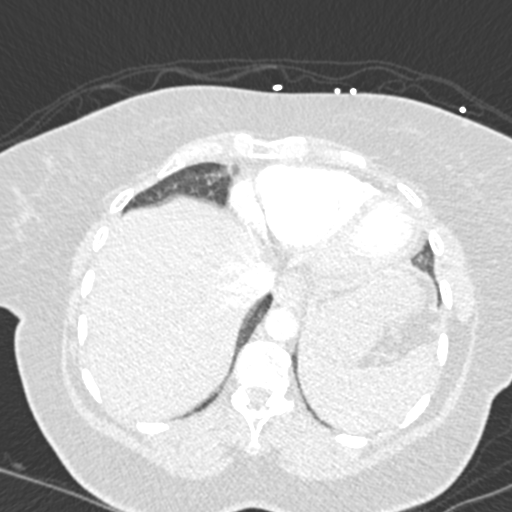
[im 88/264  mediastinal]
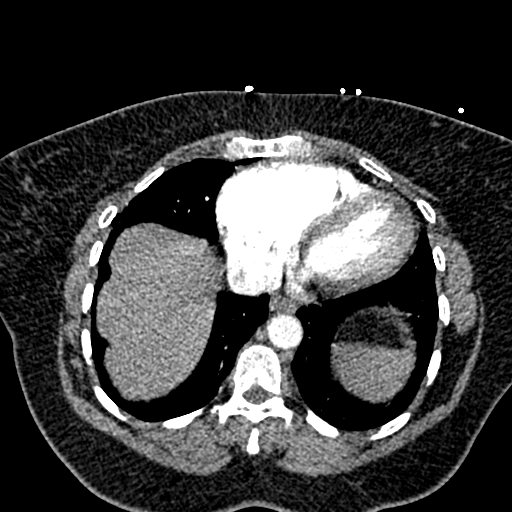
[im 103/264  lung]
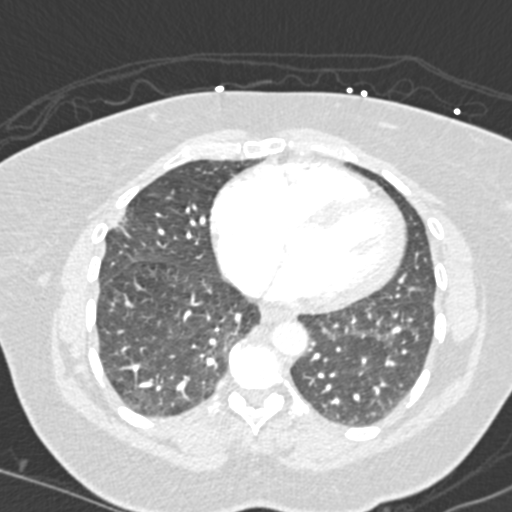
[im 117/264  mediastinal]
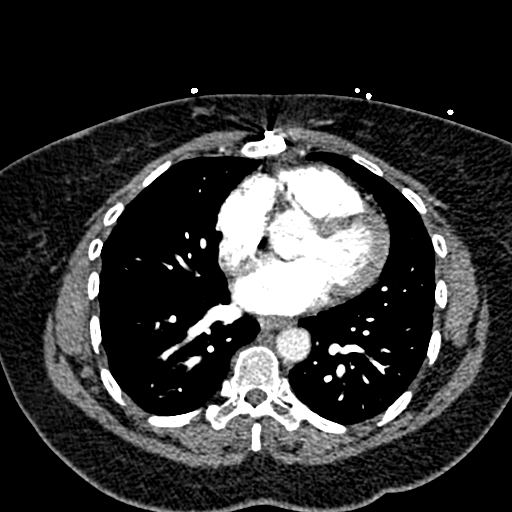
[im 132/264  lung]
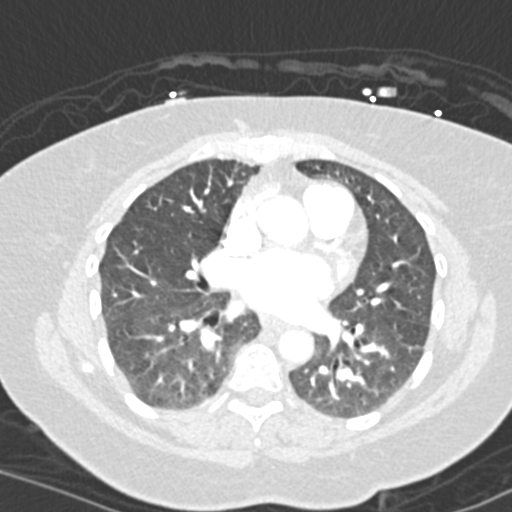
[im 147/264  mediastinal]
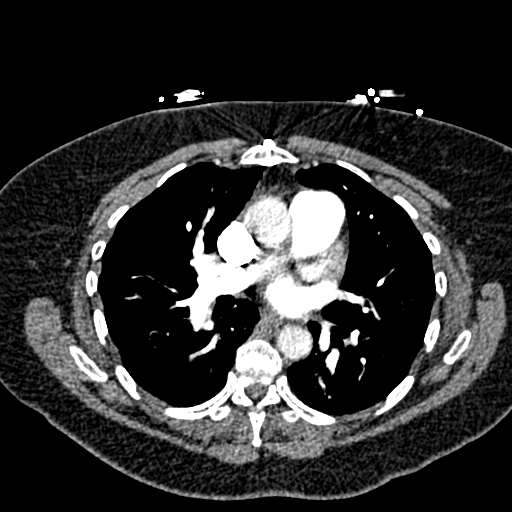
[im 161/264  lung]
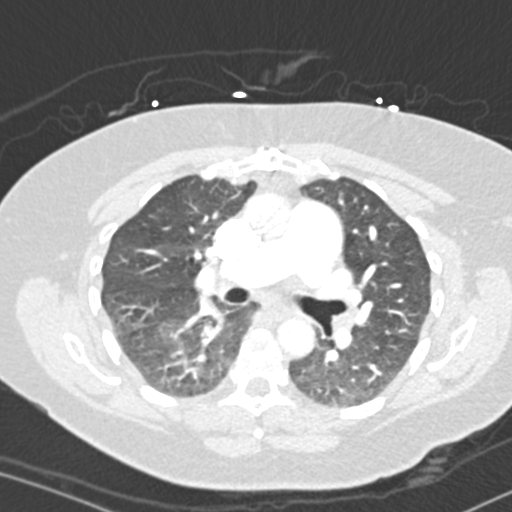
[im 176/264  mediastinal]
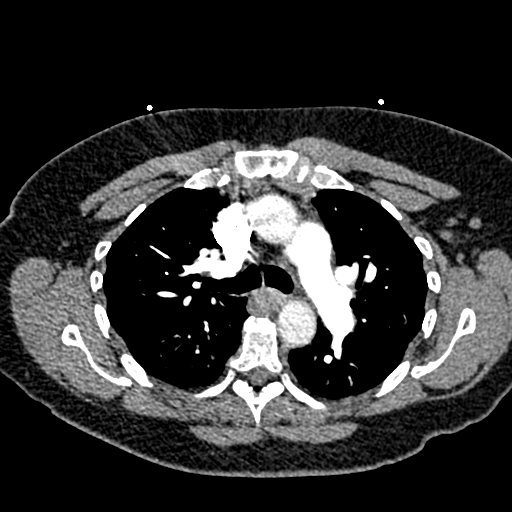
[im 190/264  lung]
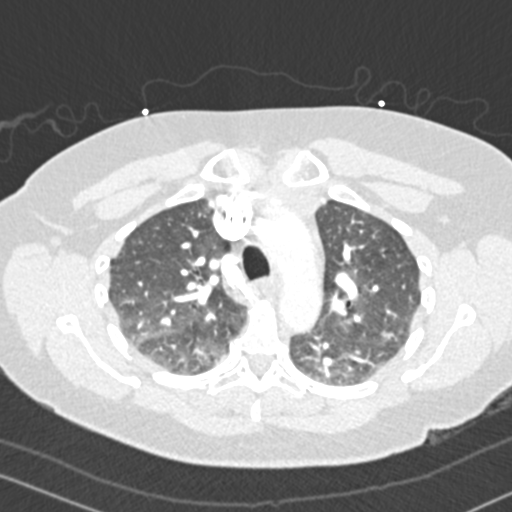
[im 205/264  mediastinal]
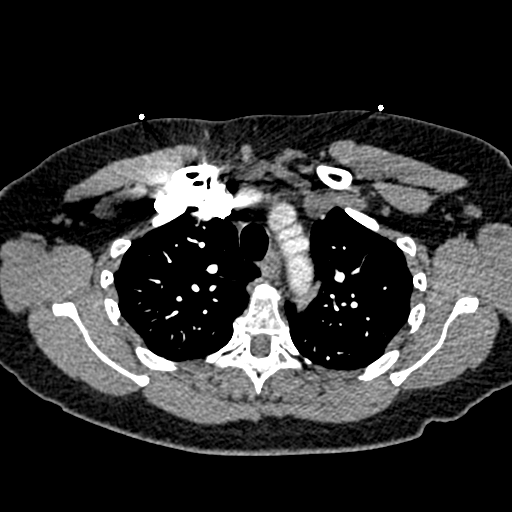
[im 220/264  lung]
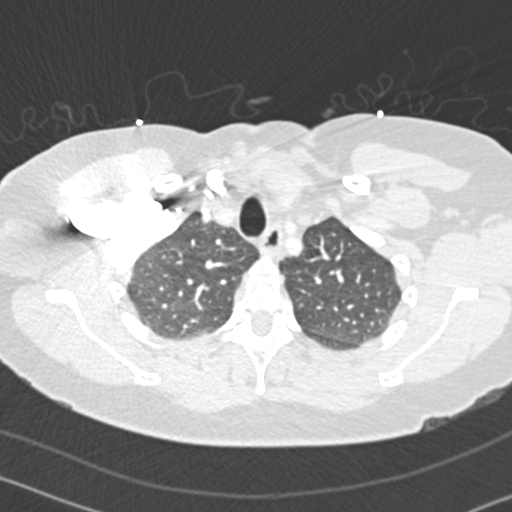
[im 234/264  mediastinal]
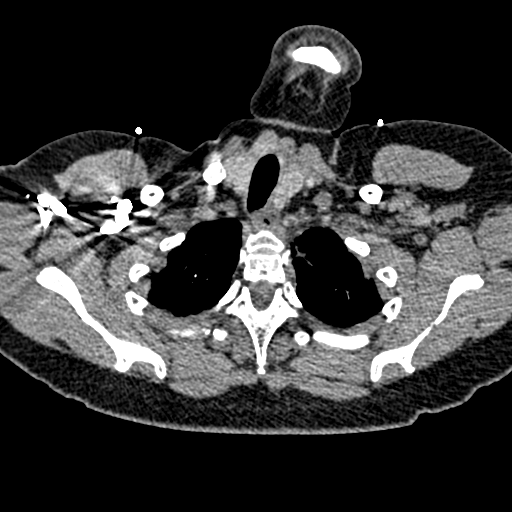
[im 249/264  lung]
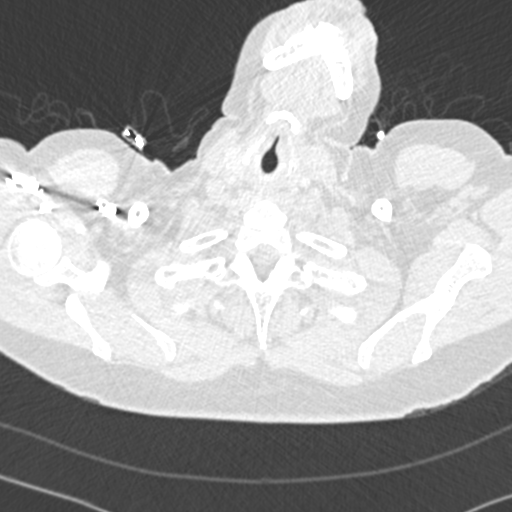

[Series 6: coronal mpr · coronal · 0.54mm/px · 1 of 131 slices shown]
[im 66/131  mediastinal]
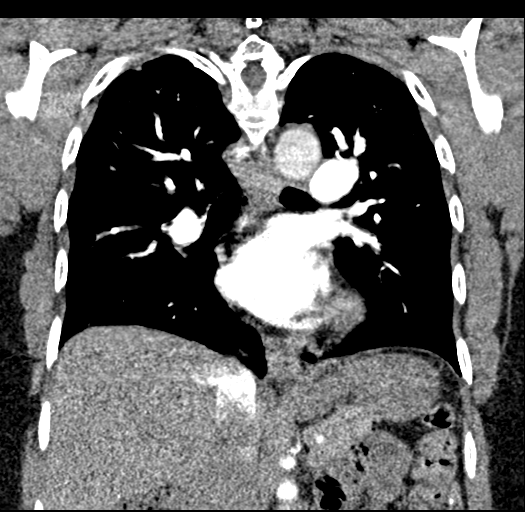

[18 of 36 positions shown; findings below may reference images not displayed]

RADIATION DOSE REDUCTION: This exam was performed according to the
departmental dose-optimization program which includes automated
exposure control, adjustment of the mA and/or kV according to
patient size and/or use of iterative reconstruction technique.

CONTRAST:  80mL OMNIPAQUE IOHEXOL 350 MG/ML SOLN
FINDINGS: Cardiovascular: Excellent contrast bolus timing in the pulmonary
arterial tree. Mild respiratory motion.

No focal filling defect identified in the pulmonary arteries to
suggest acute pulmonary embolism.

Prior sternotomy. No cardiomegaly or pericardial effusion. Little
contrast in the aorta which appears grossly negative. Questionable
calcified coronary artery atherosclerosis.

Mediastinum/Nodes: Negative. No mediastinal mass or lymphadenopathy.

Lungs/Pleura: Mildly lower lung volumes compared to [UO]. Major
airways remain patent. Mild perihilar and dependent crowding of lung
markings, atelectasis. No pleural effusion or convincing pulmonary
inflammation.

Upper Abdomen: Negative visible essentially noncontrast liver,
gallbladder, spleen and bowel in the upper abdomen. Negative visible
pancreas, adrenal glands and kidneys.

Musculoskeletal: Prior sternotomy. Increased exaggerated thoracic
kyphosis since [UO]. No acute osseous abnormality identified.

Review of the MIP images confirms the above findings.
IMPRESSION: 1. No evidence of acute pulmonary embolus.
2. Mild pulmonary atelectasis.   Prior sternotomy.

## 2021-11-14 IMAGING — DX DG CHEST 1V PORT
1 series · 1 of 1 positions shown · non-contrast
Comparison: Chest x-ray [DATE].

CLINICAL DATA: 50-year-old female with history of left arm pain 3-4
days ago radiating to the center of her chest.

EXAM:
PORTABLE CHEST 1 VIEW

[chest ap]
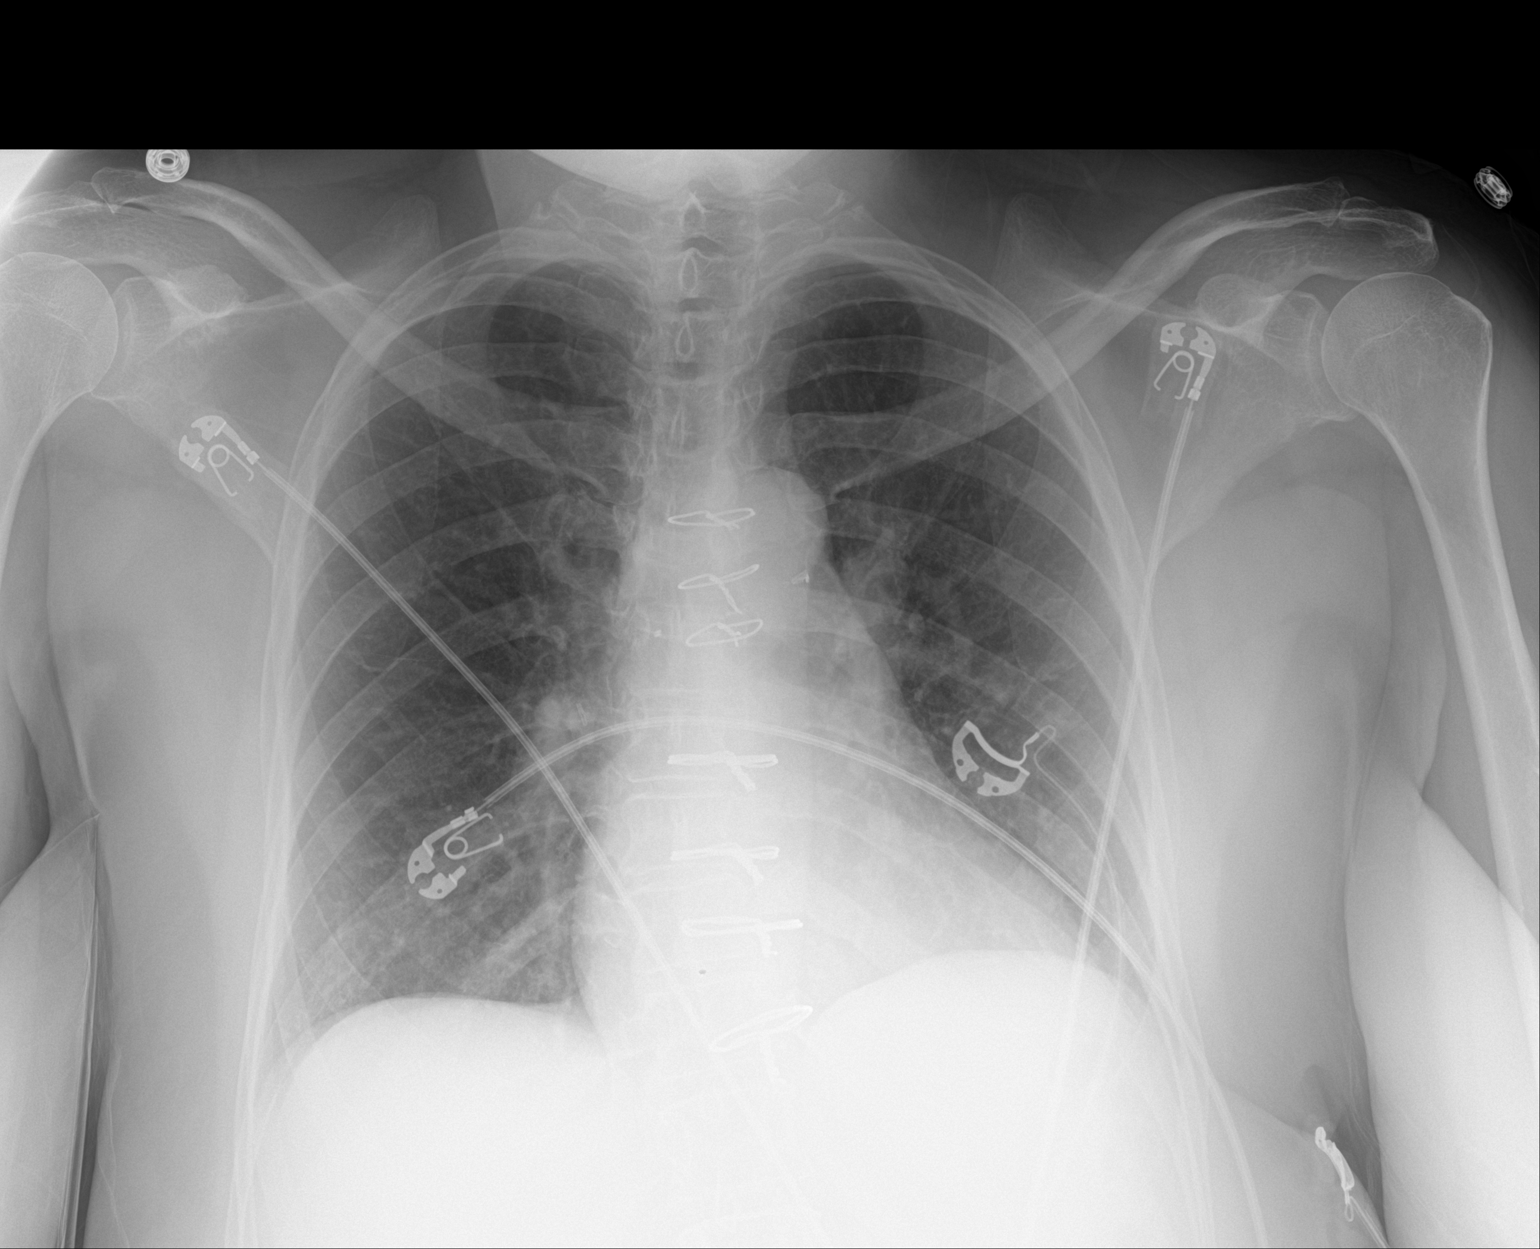

[1 of 1 positions shown; findings below may reference images not displayed]

FINDINGS: Lung volumes are normal. No consolidative airspace disease. No
pleural effusions. No pneumothorax. No pulmonary nodule or mass
noted. Pulmonary vasculature and the cardiomediastinal silhouette
are within normal limits. Status post median sternotomy.
IMPRESSION: 1.  No radiographic evidence of acute cardiopulmonary disease.

## 2021-11-14 MED ORDER — IOHEXOL 350 MG/ML SOLN
100.0000 mL | Freq: Once | INTRAVENOUS | Status: AC | PRN
  Administered 2021-11-14: 80 mL via INTRAVENOUS

## 2021-11-14 MED ORDER — ACETAMINOPHEN 500 MG PO TABS
1000.0000 mg | ORAL_TABLET | Freq: Once | ORAL | Status: AC
  Administered 2021-11-14: 1000 mg via ORAL
  Filled 2021-11-14: qty 2

## 2021-11-14 NOTE — ED Provider Notes (Signed)
?[facility] ?Provider Note ? ? ?CSN: [id number] ?Arrival date & time: 11/13/21  2233 ? ?  ? ?History ? ?Chief Complaint  ?Patient presents with  ? Arm Pain  ? ? ?[name] is a 51 y.o. female.  Patient presents to the emergency department complaining of left arm pain and left-sided chest pain.  Patient states that the arm pain began approximately 2 days ago and the chest pain began yesterday.  The patient describes the pain in her chest as sharp.  She denies shortness of breath at this time.  She has pain in the axillary region of the left arm when moving the arm.  She complains of tenderness to palpation in both the chest and the arm.  Patient nausea, vomiting, abdominal pain, shortness of breath.  Endorses chest pain, arm pain.  Endorses mild headache.  Patient has history of pulmonary embolism, CTEPH, primary hypercoagulable state, hep B infection, DVT, and diabetes mellitus ? ?HPI ? ?  ? ?Home Medications ?Prior to Admission medications   ?Medication Sig Start Date End Date Taking? Authorizing Provider  ?acetaminophen (TYLENOL) 500 MG tablet Take 500 mg by mouth every 6 (six) hours as needed for headache (pain).    [provider]  ?celecoxib (CELEBREX) 100 MG capsule Take 1 capsule (100 mg total) by mouth daily. 08/04/21   Janith Lima, MD  ?gabapentin (NEURONTIN) 100 MG capsule Take 2 capsules (200 mg total) by mouth at bedtime. 03/20/21   Lyndal Pulley, DO  ?indapamide (LOZOL) 1.25 MG tablet Take 1 tablet (1.25 mg total) by mouth daily. 02/27/21   Janith Lima, MD  ?Multiple Vitamins-Minerals (CENTRUM ADULTS) TABS Take by mouth.    [provider]  ?rivaroxaban (XARELTO) 20 MG TABS tablet TAKE 1 TABLET BY MOUTH ONCE DAILY IN THE MORNING 12/22/20   Chesley Mires, MD  ?rosuvastatin (CRESTOR) 10 MG tablet Take 1 tablet (10 mg total) by mouth daily. 08/04/21   Janith Lima, MD  ?   ? ?Allergies    ?Aspirin   ? ?Review of Systems   ?Review of Systems   ?Constitutional:  Negative for fever.  ?Respiratory:  Negative for shortness of breath.   ?Cardiovascular:  Positive for chest pain. Negative for leg swelling.  ?Gastrointestinal:  Negative for abdominal pain and nausea.  ?Neurological:  Positive for headaches.  ? ?Physical Exam ?Updated Vital Signs ?BP 124/83   Pulse 72   Temp 98.3 ?F (36.8 ?C) (Oral)   Resp 13   Ht 5' 4"  (1.626 m)   Wt 84.8 kg   LMP 07/24/2018 (Exact Date)   SpO2 100%   BMI 32.10 kg/m?  ?Physical Exam ?Vitals and nursing note reviewed.  ?Constitutional:   ?   General: She is not in acute distress. ?HENT:  ?   Head: Normocephalic and atraumatic.  ?Eyes:  ?   Conjunctiva/sclera: Conjunctivae normal.  ?Cardiovascular:  ?   Rate and Rhythm: Normal rate and regular rhythm.  ?   Pulses: Normal pulses.  ?   Heart sounds: Normal heart sounds.  ?Pulmonary:  ?   Effort: Pulmonary effort is normal.  ?   Breath sounds: Normal breath sounds.  ?Abdominal:  ?   Palpations: Abdomen is soft.  ?   Tenderness: There is no abdominal tenderness.  ?Musculoskeletal:     ?   General: Tenderness present. Normal range of motion.  ?   Cervical back: Normal range of motion and neck supple.  ?  Comments: Tenderness to palpation of the left medial arm from axilla to elbow.  ? ?Left anterior chest tender to palpation  ?Skin: ?   General: Skin is warm and dry.  ?Neurological:  ?   Mental Status: She is alert and oriented to person, place, and time.  ? ? ?ED Results / Procedures / Treatments   ?Labs ?(all labs ordered are listed, but only abnormal results are displayed) ?Labs Reviewed  ?BASIC METABOLIC PANEL - Abnormal; Notable for the following components:  ?    Result Value  ? Glucose, Bld 109 (*)   ? All other components within normal limits  ?CBC - Abnormal; Notable for the following components:  ? RBC 5.13 (*)   ? All other components within normal limits  ?TROPONIN I (HIGH SENSITIVITY)  ?TROPONIN I (HIGH SENSITIVITY)  ? ? ?EKG ?EKG  Interpretation ? ?Date/Time:  Saturday November 14 2021 07:23:55 EDT ?Ventricular Rate:  72 ?PR Interval:  183 ?QRS Duration: 88 ?QT Interval:  404 ?QTC Calculation: 443 ?R Axis:   72 ?Text Interpretation: Sinus rhythm Prominent P waves, nondiagnostic Low voltage, precordial leads ST elev, probable normal early repol pattern Confirmed by Dene Gentry [phone]) on 11/14/2021 7:28:24 AM ? ?Radiology ?CT Angio Chest PE W/Cm &/Or Wo Cm ? ?Result Date: 11/14/2021 ?CLINICAL DATA:  51 year old female with chest and left arm pain. Headache and dizziness. EXAM: CT ANGIOGRAPHY CHEST WITH CONTRAST TECHNIQUE: Multidetector CT imaging of the chest was performed using the standard protocol during bolus administration of intravenous contrast. Multiplanar CT image reconstructions and MIPs were obtained to evaluate the vascular anatomy. RADIATION DOSE REDUCTION: This exam was performed according to the departmental dose-optimization program which includes automated exposure control, adjustment of the mA and/or kV according to patient size and/or use of iterative reconstruction technique. CONTRAST:  51m OMNIPAQUE IOHEXOL 350 MG/ML SOLN COMPARISON:  Portable chest 0703 hours today. Chest CTA 01/16/2015. FINDINGS: Cardiovascular: Excellent contrast bolus timing in the pulmonary arterial tree. Mild respiratory motion. No focal filling defect identified in the pulmonary arteries to suggest acute pulmonary embolism. Prior sternotomy. No cardiomegaly or pericardial effusion. Little contrast in the aorta which appears grossly negative. Questionable calcified coronary artery atherosclerosis. Mediastinum/Nodes: Negative. No mediastinal mass or lymphadenopathy. Lungs/Pleura: Mildly lower lung volumes compared to 2016. Major airways remain patent. Mild perihilar and dependent crowding of lung markings, atelectasis. No pleural effusion or convincing pulmonary inflammation. Upper Abdomen: Negative visible essentially noncontrast liver, gallbladder,  spleen and bowel in the upper abdomen. Negative visible pancreas, adrenal glands and kidneys. Musculoskeletal: Prior sternotomy. Increased exaggerated thoracic kyphosis since 2016. No acute osseous abnormality identified. Review of the MIP images confirms the above findings. IMPRESSION: 1. No evidence of acute pulmonary embolus. 2. Mild pulmonary atelectasis.   Prior sternotomy. Electronically Signed   By: HGenevie AnnM.D.   On: 11/14/2021 09:34  ? ?DG Chest Port 1 View ? ?Result Date: 11/14/2021 ?CLINICAL DATA:  51year old female with history of left arm pain 3-4 days ago radiating to the center of her chest. EXAM: PORTABLE CHEST 1 VIEW COMPARISON:  Chest x-ray 06/21/2021. FINDINGS: Lung volumes are normal. No consolidative airspace disease. No pleural effusions. No pneumothorax. No pulmonary nodule or mass noted. Pulmonary vasculature and the cardiomediastinal silhouette are within normal limits. Status post median sternotomy. IMPRESSION: 1.  No radiographic evidence of acute cardiopulmonary disease. Electronically Signed   By: DVinnie LangtonM.D.   On: 11/14/2021 07:43   ? ?Procedures ?Procedures  ? ? ?Medications Ordered in ED ?Medications  ?  acetaminophen (TYLENOL) tablet 1,000 mg (1,000 mg Oral Given 11/14/21 0750)  ?iohexol (OMNIPAQUE) 350 MG/ML injection 100 mL (80 mLs Intravenous Contrast Given 11/14/21 0913)  ? ? ?ED Course/ Medical Decision Making/ A&P ?  ?                        ?Medical Decision Making ?Amount and/or Complexity of Data Reviewed ?Labs: ordered. ?Radiology: ordered. ? ?Risk ?OTC drugs. ? ? ?This patient presents to the ED for concern of chest pain, this involves an extensive number of treatment options, and is a complaint that carries with it a high risk of complications and morbidity.  The differential diagnosis includes musculoskeletal pain, PE, ACS, DVT, and others ? ? ?Co morbidities that complicate the patient evaluation ? ?Hx of DVT, PE, hypercoagulable state ? ? ?Additional history  obtained: ? ? ?External records from outside source obtained and reviewed including office notes from 12/19/2019 for CTEPH ? ? ?Lab Tests: ? ?I Ordered, and personally interpreted labs.  The pertinent results include:  Initial troponin 2 ? ? ?Im

## 2021-11-14 NOTE — Discharge Instructions (Signed)
You were seen for left arm pain and left-sided chest pain.  Work-up shows no concern for acute coronary syndrome or pulmonary embolism at this time.  I recommend Tylenol for pain as needed.  Follow-up with primary care if your pain continues. ?

## 2021-11-14 NOTE — ED Notes (Signed)
Pt states understanding of dc instructions, importance of follow up. Pt denies questions or concerns and declined transportation assistance upon dc. Pt ambulated w/ a steady gait w/o need for assistance. No belongings left in room upon dc. ? ?

## 2021-11-14 NOTE — ED Notes (Signed)
Per [place] PA, do not draw labs or obtain EKG.  ?

## 2021-12-11 ENCOUNTER — Ambulatory Visit: Payer: BC Managed Care – PPO | Admitting: Pulmonary Disease

## 2021-12-11 ENCOUNTER — Encounter: Payer: Self-pay | Admitting: Pulmonary Disease

## 2021-12-11 VITALS — BP 116/68 | HR 87 | Temp 98.3°F | Ht 64.0 in | Wt 179.0 lb

## 2021-12-11 DIAGNOSIS — I2724 Chronic thromboembolic pulmonary hypertension: Secondary | ICD-10-CM | POA: Diagnosis not present

## 2021-12-11 DIAGNOSIS — J301 Allergic rhinitis due to pollen: Secondary | ICD-10-CM | POA: Diagnosis not present

## 2021-12-11 DIAGNOSIS — G4762 Sleep related leg cramps: Secondary | ICD-10-CM

## 2021-12-11 DIAGNOSIS — G4734 Idiopathic sleep related nonobstructive alveolar hypoventilation: Secondary | ICD-10-CM

## 2021-12-11 MED ORDER — RIVAROXABAN 20 MG PO TABS: ORAL_TABLET | ORAL | 3 refills | Status: DC

## 2021-12-11 MED ORDER — FLUTICASONE PROPIONATE 50 MCG/ACT NA SUSP: 1.0000 | Freq: Every day | NASAL | 2 refills | Status: DC

## 2021-12-11 NOTE — Progress Notes (Signed)
? ?[facility] ? ?Chief Complaint  ?Patient presents with  ? Follow-up  ?  No c/o   ? ? ?Constitutional:  ?BP 116/68 (BP Location: Left Arm, Patient Position: Sitting, Cuff Size: Normal)   Pulse 87   Temp 98.3 ?F (36.8 ?C) (Oral)   Ht 5' 4"  (1.626 m)   Wt 179 lb (81.2 kg)   SpO2 99%   BMI 30.73 kg/m?  ? ?Past Medical History:  ?Hepatitis B, Season allergies, Pre DM, HTN, Headaches, Back pain ? ?Past Surgical History:  ?She  has a past surgical history that includes Pulmonary artery endarterectomy (2007); Cardiac catheterization (11/2005); and Vena cava filter placement. ? ?Brief Summary:  ?[name] is a 51 y.o. female with recurrent PE/DVT and chronic thromboembolic pulmonary hypertension on life long anticoagulation.  She had pulmonary endarterectomy at [facility] in 2007. ?  ? ? ? ?Subjective:  ? ?She was seen in the ER on 11/14/21.  She had Lt arm and Lt sided chest pain.  CT angio chest was negative for PE or other significant lung pathology.  Felt to be musculoskeletal, and recommended prn analgesic. ? ?She is having more sinus congestion and sneezing with this years pollen season. ? ?No having chest pain, dyspnea, bleeding, bruising, swelling. ? ?Has been getting legs cramps at night about 3 to 4 nights per week. ? ?Physical Exam:  ? ?Appearance - well kempt  ? ?ENMT - no sinus tenderness, no oral exudate, no LAN, Mallampati 2 airway, no stridor ? ?Respiratory - equal breath sounds bilaterally, no wheezing or rales ? ?CV - s1s2 regular rate and rhythm, no murmurs ? ?Ext - no clubbing, no edema ? ?Skin - no rashes ? ?Psych - normal mood and affect ? ?  ?Pulmonary testing:  ?PFT 11/14/12 >> FEV1 2.52 (95%), FEV1% 92, TLC 3.75 (70%), DLCO 76%, no BD ? ?Chest Imaging:  ?V/Q scan 03/18/06 >> multiple b/l large mismatched segmental perfusion defects from chronic PE ?CT chest 12/14/12 >> 3 mm RUL nodule, scar LUL posterior segment ?V/Q scan 06/06/14 >> very low  probability for PE ?CT chest 01/16/15 >> mild dilation of pulmonary trunk ?CT angio chest 11/14/21 >> mild ATX, no PE ? ?Cardiac Tests:  ?Echo 06/11/14 >> EF 55 to 60%, mild TR and PR ? ?Social History:  ?She  reports that she has never smoked. She has never used smokeless tobacco. She reports that she does not drink alcohol and does not use drugs. ? ?Family History:  ?Her family history includes Hypertension in her father and mother. ?  ? ? ?Assessment/Plan:  ? ?Chronic thromboembolic pulmonary hypertension. ?- she is on lifelong anticoagulation ?- continue xarelto 20 mg daily ? ?Allergic rhinitis. ?- prn flonase ? ?Nocturnal leg cramps. ?- will arrange for overnight oximetry on room air; if this is unrevealing, then advised her to f/u with her PCP ? ?Travel advice. ?- she has regular trips to visit family in [place] ?- seen by Dr. Scharlene Gloss with ID in August 2022 ? ?  ?Time Spent Involved in Patient Care on Day of Examination:  ?35 minutes ? ?Follow up:  ? ?Patient Instructions  ?Will arrange for overnight oxygen test ? ?3 month refill for xarelto sent to mail order pharmacy ? ?Flonase sent to your local pharmacy ? ?Follow up in 1 year ? ?Medication List:  ? ?Allergies as of 12/11/2021   ? ?   Reactions  ? Aspirin Other (See Comments)  ? Gi upset/ulcer  ? ?  ? ?  ?  Medication List  ?  ? ?  ? Accurate as of Dec 11, 2021  9:28 AM. If you have any questions, ask your nurse or doctor.  ?  ?  ? ?  ? ?acetaminophen 500 MG tablet ?Commonly known as: TYLENOL ?Take 500 mg by mouth every 6 (six) hours as needed for headache (pain). ?  ?celecoxib 100 MG capsule ?Commonly known as: CeleBREX ?Take 1 capsule (100 mg total) by mouth daily. ?  ?Centrum Adults Tabs ?Take by mouth. ?  ?fluticasone 50 MCG/ACT nasal spray ?Commonly known as: FLONASE ?Place 1 spray into both nostrils daily. ?Started by: Chesley Mires, MD ?  ?gabapentin 100 MG capsule ?Commonly known as: NEURONTIN ?Take 2 capsules (200 mg total) by mouth at bedtime. ?   ?indapamide 1.25 MG tablet ?Commonly known as: LOZOL ?Take 1 tablet (1.25 mg total) by mouth daily. ?  ?rivaroxaban 20 MG Tabs tablet ?Commonly known as: Xarelto ?TAKE 1 TABLET BY MOUTH ONCE DAILY IN THE MORNING ?  ?rosuvastatin 10 MG tablet ?Commonly known as: Crestor ?Take 1 tablet (10 mg total) by mouth daily. ?  ? ?  ? ? ?Signature:  ?Chesley Mires, MD ?[facility] ?Pager - [phone] ?12/11/2021, 9:28 AM ?  ? ? ? ? ? ? ? ? ?

## 2021-12-11 NOTE — Patient Instructions (Signed)
Will arrange for overnight oxygen test ? ?3 month refill for xarelto sent to mail order pharmacy ? ?Flonase sent to your local pharmacy ? ?Follow up in 1 year ?

## 2022-01-07 ENCOUNTER — Telehealth: Payer: Self-pay | Admitting: Pulmonary Disease

## 2022-01-07 DIAGNOSIS — G4734 Idiopathic sleep related nonobstructive alveolar hypoventilation: Secondary | ICD-10-CM

## 2022-01-07 NOTE — Telephone Encounter (Signed)
Called and went over results with patient and she voiced understanding.  Explained the difference to patient  between the ONO test and HST since there was confusion voiced from patient about having another test done. Once difference was explained, patient was agreeable to HST. Order placed and patient was notified that it could be at lest 8-10 weeks before someone reaches out to schedule HST. Nothing further needed at this time.

## 2022-01-07 NOTE — Telephone Encounter (Signed)
ONO with RA 12/22/21 >> test time 7 hrs 56 min.  Basal SpO2 94.7%, low SpO2 77%.  Spent 3 min 36 sec with SpO2 < 88%.   Please let her know that her oxygen test shows that her oxygen level drops intermittently at night.  This can be seen with sleep apnea.  If she is agreeable, then please schedule a home sleep study to assess further.

## 2022-01-10 ENCOUNTER — Other Ambulatory Visit: Payer: Self-pay | Admitting: Pulmonary Disease

## 2022-01-21 ENCOUNTER — Encounter: Payer: Self-pay | Admitting: Internal Medicine

## 2022-01-21 ENCOUNTER — Ambulatory Visit: Payer: BC Managed Care – PPO | Admitting: Internal Medicine

## 2022-01-21 VITALS — BP 126/84 | HR 75 | Temp 98.2°F | Ht 64.0 in | Wt 178.0 lb

## 2022-01-21 DIAGNOSIS — G4485 Primary stabbing headache: Secondary | ICD-10-CM | POA: Insufficient documentation

## 2022-01-21 DIAGNOSIS — R7303 Prediabetes: Secondary | ICD-10-CM | POA: Diagnosis not present

## 2022-01-21 DIAGNOSIS — R519 Headache, unspecified: Secondary | ICD-10-CM | POA: Insufficient documentation

## 2022-01-21 DIAGNOSIS — E118 Type 2 diabetes mellitus with unspecified complications: Secondary | ICD-10-CM

## 2022-01-21 DIAGNOSIS — E785 Hyperlipidemia, unspecified: Secondary | ICD-10-CM | POA: Diagnosis not present

## 2022-01-21 DIAGNOSIS — I10 Essential (primary) hypertension: Secondary | ICD-10-CM

## 2022-01-21 DIAGNOSIS — B181 Chronic viral hepatitis B without delta-agent: Secondary | ICD-10-CM

## 2022-01-21 LAB — SEDIMENTATION RATE: Sed Rate: 13 mm/hr (ref 0–30)

## 2022-01-21 LAB — BASIC METABOLIC PANEL
BUN: 18 mg/dL (ref 6–23)
CO2: 29 mEq/L (ref 19–32)
Calcium: 9.6 mg/dL (ref 8.4–10.5)
Chloride: 103 mEq/L (ref 96–112)
Creatinine, Ser: 0.86 mg/dL (ref 0.40–1.20)
GFR: 77.29 mL/min (ref >60.00)
Glucose, Bld: 98 mg/dL (ref 70–99)
Potassium: 4.3 mEq/L (ref 3.5–5.1)
Sodium: 139 mEq/L (ref 135–145)

## 2022-01-21 LAB — LIPID PANEL
Cholesterol: 139 mg/dL (ref 0–200)
HDL: 55.6 mg/dL (ref >39.00)
LDL Cholesterol: 72 mg/dL (ref 0–99)
NonHDL: 83.3
Total CHOL/HDL Ratio: 2
Triglycerides: 56 mg/dL (ref 0.0–149.0)
VLDL: 11.2 mg/dL (ref 0.0–40.0)

## 2022-01-21 LAB — HEMOGLOBIN A1C: Hgb A1c MFr Bld: 6.3 % (ref 4.6–6.5)

## 2022-01-21 LAB — TSH: TSH: 2.63 u[IU]/mL (ref 0.35–5.50)

## 2022-01-21 NOTE — Progress Notes (Signed)
Subjective:  Patient ID: [name], female    DOB: Oct 22, 1970  Age: 51 y.o. MRN: [id number]  CC: Hypertension, Hyperlipidemia, and Headache   HPI [name] presents for f/up -  [name] complains of a 1 week history of the worst headache of [name] life.  [name] describes it as a stabbing sensation on the entire top of [name] scalp.  [name] is also had some numbness and weakness, more in [name] upper extremities and lower extremities.  [name] denies nausea, vomiting, slurred speech, ataxia, or visual difficulty.  Outpatient Medications Prior to Visit  Medication Sig Dispense Refill   acetaminophen (TYLENOL) 500 MG tablet Take 500 mg by mouth every 6 (six) hours as needed for headache (pain).     celecoxib (CELEBREX) 100 MG capsule Take 1 capsule (100 mg total) by mouth daily. 90 capsule 1   fluticasone (FLONASE) 50 MCG/ACT nasal spray Place 1 spray into both nostrils daily. 16 g 2   gabapentin (NEURONTIN) 100 MG capsule Take 2 capsules (200 mg total) by mouth at bedtime. 60 capsule 3   indapamide (LOZOL) 1.25 MG tablet Take 1 tablet (1.25 mg total) by mouth daily. 90 tablet 0   Multiple Vitamins-Minerals (CENTRUM ADULTS) TABS Take by mouth.     rivaroxaban (XARELTO) 20 MG TABS tablet TAKE 1 TABLET BY MOUTH ONCE DAILY IN THE MORNING 30 tablet 2   rosuvastatin (CRESTOR) 10 MG tablet Take 1 tablet (10 mg total) by mouth daily. 90 tablet 1   No facility-administered medications prior to visit.    ROS Review of Systems  Constitutional:  Negative for chills, diaphoresis, fatigue and fever.  HENT: Negative.    Eyes:  Negative for visual disturbance.  Respiratory:  Negative for cough, chest tightness, shortness of breath and wheezing.   Cardiovascular:  Negative for chest pain, palpitations and leg swelling.  Gastrointestinal:  Negative for abdominal pain, diarrhea, nausea and vomiting.  Endocrine: Negative.   Genitourinary: Negative.  Negative for difficulty urinating.  Musculoskeletal:  Negative.   Skin: Negative.   Neurological:  Positive for dizziness, weakness, numbness and headaches. Negative for syncope, speech difficulty and light-headedness.  Hematological:  Negative for adenopathy. Does not bruise/bleed easily.  Psychiatric/Behavioral: Negative.      Objective:  BP 126/84 (BP Location: Right Arm, Patient Position: Sitting, Cuff Size: Large)   Pulse 75   Temp 98.2 F (36.8 C) (Oral)   Ht 5' 4"  (1.626 m)   Wt 178 lb (80.7 kg)   SpO2 98%   BMI 30.55 kg/m   BP Readings from Last 3 Encounters:  01/21/22 126/84  12/11/21 116/68  11/14/21 124/83    Wt Readings from Last 3 Encounters:  01/21/22 178 lb (80.7 kg)  12/11/21 179 lb (81.2 kg)  11/13/21 187 lb (84.8 kg)    Physical Exam Vitals reviewed.  Constitutional:      Appearance: [name] is not ill-appearing.  HENT:     Nose: Nose normal.     Mouth/Throat:     Mouth: Mucous membranes are moist.  Eyes:     General: No scleral icterus.    Extraocular Movements: Extraocular movements intact.     Pupils: Pupils are equal, round, and reactive to light.  Cardiovascular:     Rate and Rhythm: Normal rate and regular rhythm.     Heart sounds: No murmur heard. Pulmonary:     Effort: Pulmonary effort is normal.     Breath sounds: No stridor. No wheezing, rhonchi or rales.  Abdominal:  General: Abdomen is flat.     Palpations: There is no mass.     Tenderness: There is no abdominal tenderness. There is no guarding.     Hernia: No hernia is present.  Musculoskeletal:        General: Normal range of motion.     Cervical back: Neck supple.     Right lower leg: No edema.     Left lower leg: No edema.  Lymphadenopathy:     Cervical: No cervical adenopathy.  Skin:    General: Skin is warm and dry.  Neurological:     General: No focal deficit present.     Mental Status: [name] is alert. Mental status is at baseline.     Cranial Nerves: No cranial nerve deficit or facial asymmetry.     Sensory: No sensory  deficit.     Motor: No weakness.     Gait: Gait normal.     Deep Tendon Reflexes: Reflexes normal. Babinski sign absent on the right side. Babinski sign absent on the left side.     Reflex Scores:      Tricep reflexes are 0 on the right side and 0 on the left side.      Bicep reflexes are 0 on the right side and 0 on the left side.      Brachioradialis reflexes are 0 on the right side and 0 on the left side.      Patellar reflexes are 0 on the right side and 0 on the left side.      Achilles reflexes are 0 on the right side and 0 on the left side. Psychiatric:        Mood and Affect: Mood normal.        Behavior: Behavior normal.     Lab Results  Component Value Date   WBC 4.6 11/14/2021   HGB 15.0 11/14/2021   HCT 44.1 11/14/2021   PLT 234 11/14/2021   GLUCOSE 109 (H) 11/14/2021   CHOL 194 02/24/2021   TRIG 120.0 02/24/2021   HDL 41.80 02/24/2021   LDLCALC 129 (H) 02/24/2021   ALT 25 06/21/2021   AST 30 06/21/2021   NA 140 11/14/2021   K 4.4 11/14/2021   CL 108 11/14/2021   CREATININE 0.89 11/14/2021   BUN 19 11/14/2021   CO2 27 11/14/2021   TSH 1.43 06/30/2015   INR 2.80 03/20/2012   HGBA1C 6.3 08/04/2021   MICROALBUR <0.7 08/04/2021    CT Angio Chest PE W/Cm &/Or Wo Cm  Result Date: 11/14/2021 CLINICAL DATA:  51 year old female with chest and left arm pain. Headache and dizziness. EXAM: CT ANGIOGRAPHY CHEST WITH CONTRAST TECHNIQUE: Multidetector CT imaging of the chest was performed using the standard protocol during bolus administration of intravenous contrast. Multiplanar CT image reconstructions and MIPs were obtained to evaluate the vascular anatomy. RADIATION DOSE REDUCTION: This exam was performed according to the departmental dose-optimization program which includes automated exposure control, adjustment of the mA and/or kV according to patient size and/or use of iterative reconstruction technique. CONTRAST:  24m OMNIPAQUE IOHEXOL 350 MG/ML SOLN COMPARISON:   Portable chest 0703 hours today. Chest CTA 01/16/2015. FINDINGS: Cardiovascular: Excellent contrast bolus timing in the pulmonary arterial tree. Mild respiratory motion. No focal filling defect identified in the pulmonary arteries to suggest acute pulmonary embolism. Prior sternotomy. No cardiomegaly or pericardial effusion. Little contrast in the aorta which appears grossly negative. Questionable calcified coronary artery atherosclerosis. Mediastinum/Nodes: Negative. No mediastinal mass or lymphadenopathy. Lungs/Pleura: Mildly  lower lung volumes compared to 2016. Major airways remain patent. Mild perihilar and dependent crowding of lung markings, atelectasis. No pleural effusion or convincing pulmonary inflammation. Upper Abdomen: Negative visible essentially noncontrast liver, gallbladder, spleen and bowel in the upper abdomen. Negative visible pancreas, adrenal glands and kidneys. Musculoskeletal: Prior sternotomy. Increased exaggerated thoracic kyphosis since 2016. No acute osseous abnormality identified. Review of the MIP images confirms the above findings. IMPRESSION: 1. No evidence of acute pulmonary embolus. 2. Mild pulmonary atelectasis.   Prior sternotomy. Electronically Signed   By: Genevie Ann M.D.   On: 11/14/2021 09:34   DG Chest Port 1 View  Result Date: 11/14/2021 CLINICAL DATA:  51 year old female with history of left arm pain 3-4 days ago radiating to the center of [name] chest. EXAM: PORTABLE CHEST 1 VIEW COMPARISON:  Chest x-ray 06/21/2021. FINDINGS: Lung volumes are normal. No consolidative airspace disease. No pleural effusions. No pneumothorax. No pulmonary nodule or mass noted. Pulmonary vasculature and the cardiomediastinal silhouette are within normal limits. Status post median sternotomy. IMPRESSION: 1.  No radiographic evidence of acute cardiopulmonary disease. Electronically Signed   By: Vinnie Langton M.D.   On: 11/14/2021 07:43    Assessment & Plan:   [name] was seen today for  hypertension, hyperlipidemia and headache.  Diagnoses and all orders for this visit:  Chronic viral hepatitis B without delta agent and without coma ([place])- I will monitor for recurrence. -     Hepatitis B surface antigen; Future -     Hepatitis B surface antigen  Type II diabetes mellitus with manifestations ([place])  Hyperlipidemia with target LDL less than 100- I will monitor [name] lipid panel. -     Lipid panel; Future -     TSH; Future -     TSH -     Lipid panel  Prediabetes- I will monitor [name] A1c. -     Hemoglobin A1c; Future -     Hemoglobin A1c  Primary hypertension- [name] blood pressure is adequately well controlled. -     Basic metabolic panel; Future -     TSH; Future -     TSH -     Basic metabolic panel  Worst headache of life- Will check labs to screen for secondary causes.  I recommended that [name] undergo a stat CT scan to screen for bleed, tumor, CVA -     CT HEAD WO CONTRAST (5MM); Future -     Sedimentation rate; Future -     Sedimentation rate  Primary stabbing headache -     CT HEAD WO CONTRAST (5MM); Future -     Sedimentation rate; Future -     Sedimentation rate   I am having [name] maintain [name] acetaminophen, Centrum Adults, indapamide, gabapentin, celecoxib, rosuvastatin, fluticasone, and Xarelto.  No orders of the defined types were placed in this encounter.    Follow-up: Return in about 3 weeks (around 02/11/2022).  Scarlette Calico, MD

## 2022-01-21 NOTE — Patient Instructions (Signed)
General Headache Without Cause A headache is pain or discomfort felt around the head or neck area. There are many causes and types of headaches. A few common types include: Tension headaches. Migraine headaches. Cluster headaches. Chronic daily headaches. Sometimes, the specific cause of a headache may not be found. Follow these instructions at home: Watch your condition for any changes. Let your health care provider know about them. Take these steps to help with your condition: Managing pain     Take over-the-counter and prescription medicines only as told by your health care provider. Treatment may include medicines for pain that are taken by mouth or applied to the skin. Lie down in a dark, quiet room when you have a headache. Keep lights dim if bright lights bother you or make your headaches worse. If directed, put ice on your head and neck area: Put ice in a plastic bag. Place a towel between your skin and the bag. Leave the ice on for 20 minutes, 2-3 times per day. Remove the ice if your skin turns bright red. This is very important. If you cannot feel pain, heat, or cold, you have a greater risk of damage to the area. If directed, apply heat to the affected area. Use the heat source that your health care provider recommends, such as a moist heat pack or a heating pad. Place a towel between your skin and the heat source. Leave the heat on for 20-30 minutes. Remove the heat if your skin turns bright red. This is especially important if you are unable to feel pain, heat, or cold. You have a greater risk of getting burned. Eating and drinking Eat meals on a regular schedule. If you drink alcohol: Limit how much you have to: 0-1 drink a day for women who are not pregnant. 0-2 drinks a day for men. Know how much alcohol is in a drink. In the [place] one drink equals one 12 oz bottle of beer (355 mL), one 5 oz glass of wine (148 mL), or one 1 oz glass of hard liquor (44 mL). Stop  drinking caffeine, or decrease the amount of caffeine you drink. Drink enough fluid to keep your urine pale yellow. General instructions  Keep a headache journal to help find out what may trigger your headaches. For example, write down: What you eat and drink. How much sleep you get. Any change to your diet or medicines. Try massage or other relaxation techniques. Limit stress. Sit up straight, and do not tense your muscles. Do not use any products that contain nicotine or tobacco. These products include cigarettes, chewing tobacco, and vaping devices, such as e-cigarettes. If you need help quitting, ask your health care provider. Exercise regularly as told by your health care provider. Sleep on a regular schedule. Get 7-9 hours of sleep each night, or the amount recommended by your health care provider. Keep all follow-up visits. This is important. Contact a health care provider if: Medicine does not help your symptoms. You have a headache that is different from your usual headache. You have nausea or you vomit. You have a fever. Get help right away if: Your headache: Becomes severe quickly. Gets worse after moderate to intense physical activity. You have any of these symptoms: Repeated vomiting. Pain or stiffness in your neck. Changes to your vision. Pain in an eye or ear. Problems with speech. Muscular weakness or loss of muscle control. Loss of balance or coordination. You feel faint or pass out. You have confusion. You have  a seizure. These symptoms may represent a serious problem that is an emergency. Do not wait to see if the symptoms will go away. Get medical help right away. Call your local emergency services (911 in the [place] Do not drive yourself to the hospital. Summary A headache is pain or discomfort felt around the head or neck area. There are many causes and types of headaches. In some cases, the cause may not be found. Keep a headache journal to help find out  what may trigger your headaches. Watch your condition for any changes. Let your health care provider know about them. Contact a health care provider if you have a headache that is different from the usual headache, or if your symptoms are not helped by medicine. Get help right away if your headache becomes severe, you vomit, you have a loss of vision, you lose your balance, or you have a seizure. This information is not intended to replace advice given to you by your health care provider. Make sure you discuss any questions you have with your health care provider. Document Revised: 12/17/2020 Document Reviewed: 12/17/2020 Elsevier Patient Education  [place].

## 2022-01-22 ENCOUNTER — Ambulatory Visit
Admission: RE | Admit: 2022-01-22 | Discharge: 2022-01-22 | Disposition: A | Discharge status: Home / Self Care | Payer: BC Managed Care – PPO | Source: Ambulatory Visit | Attending: Internal Medicine | Admitting: Internal Medicine

## 2022-01-22 DIAGNOSIS — G4485 Primary stabbing headache: Secondary | ICD-10-CM

## 2022-01-22 DIAGNOSIS — R519 Headache, unspecified: Secondary | ICD-10-CM

## 2022-01-22 LAB — HEPATITIS B SURFACE ANTIGEN: Hepatitis B Surface Ag: NONREACTIVE

## 2022-01-22 IMAGING — CT CT HEAD W/O CM
1 series · 16 of 30 positions shown, 20 images · non-contrast
Comparison: CT head dated [DATE]

CLINICAL DATA: Headache, new or worsening.



[Series 2: head w/(date) · axial · 0.44mm/px · z∈[-234,-89]mm · 16 of 33 slices shown, 20 images]
[im 2/33  brain]
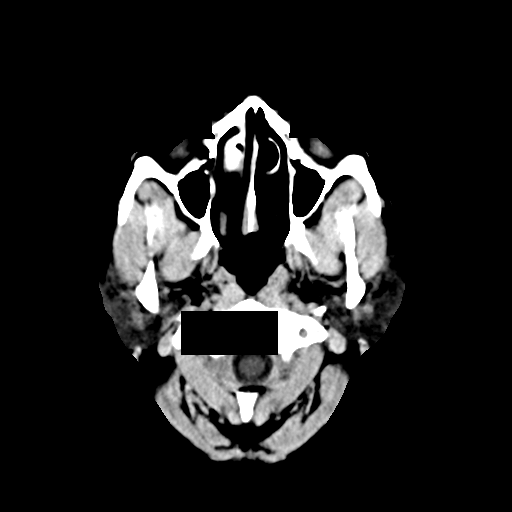
[im 2/33  bone]
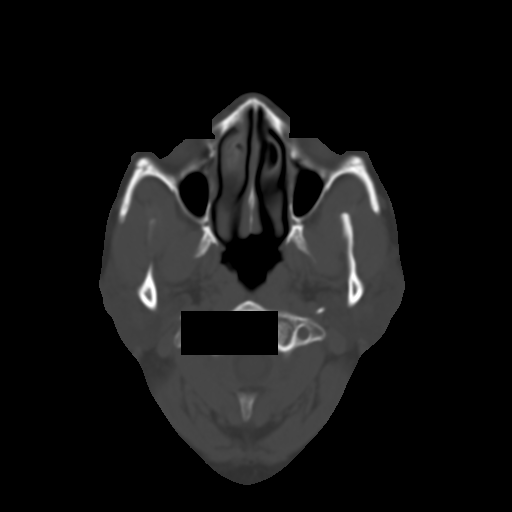
[im 4/33  brain]
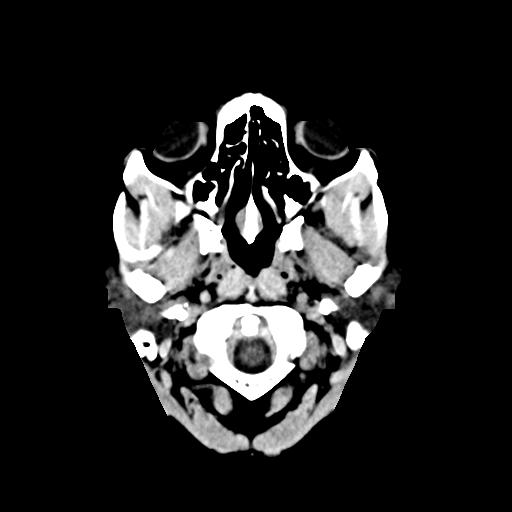
[im 6/33  brain]
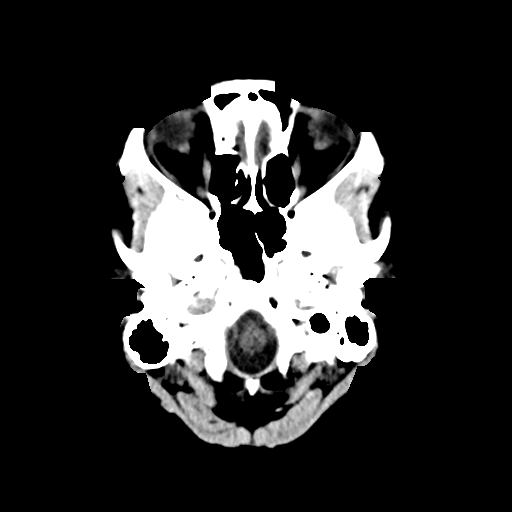
[im 8/33  brain]
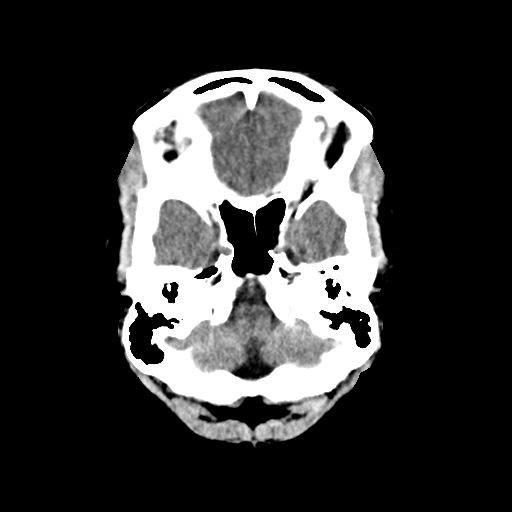
[im 9/33  brain]
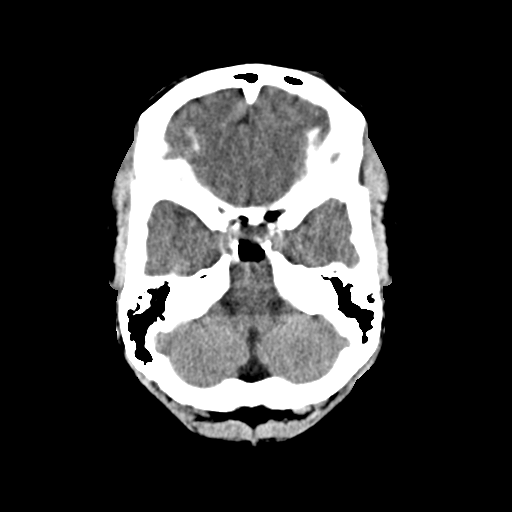
[im 9/33  bone]
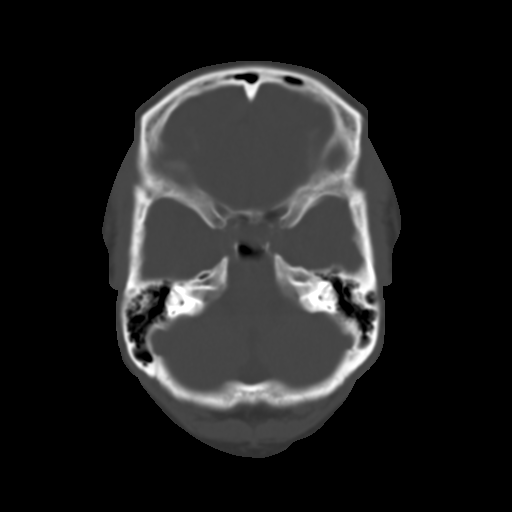
[im 12/33  brain]
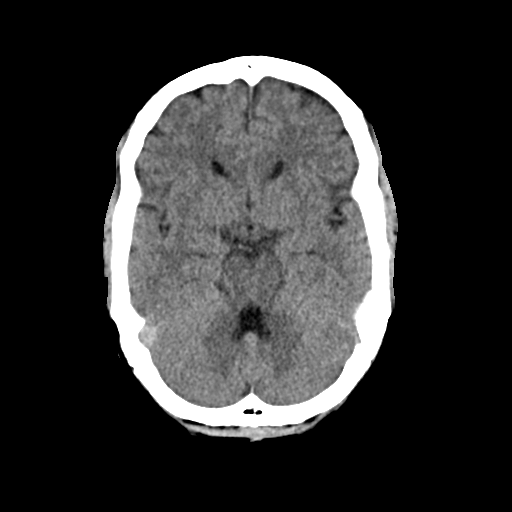
[im 14/33  brain]
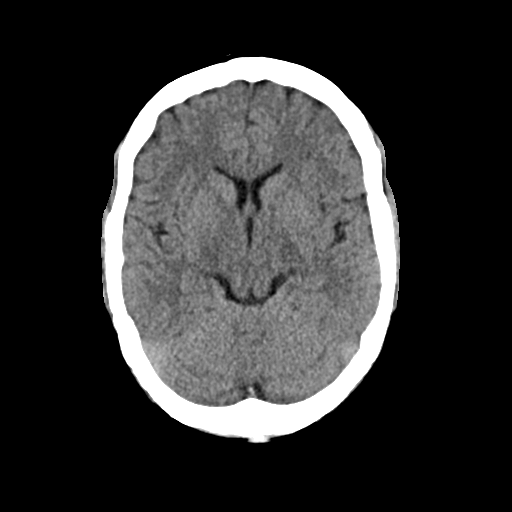
[im 16/33  brain]
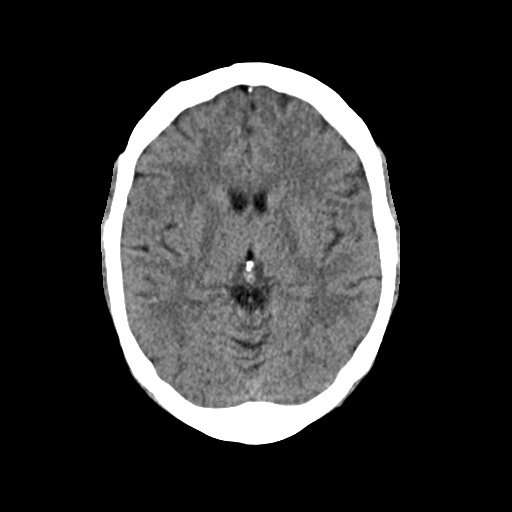
[im 17/33  brain]
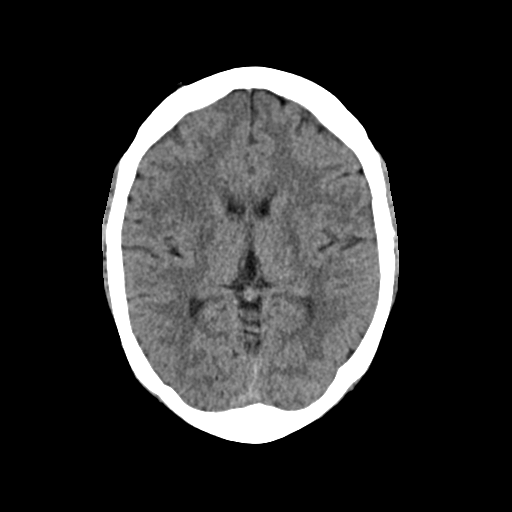
[im 17/33  bone]
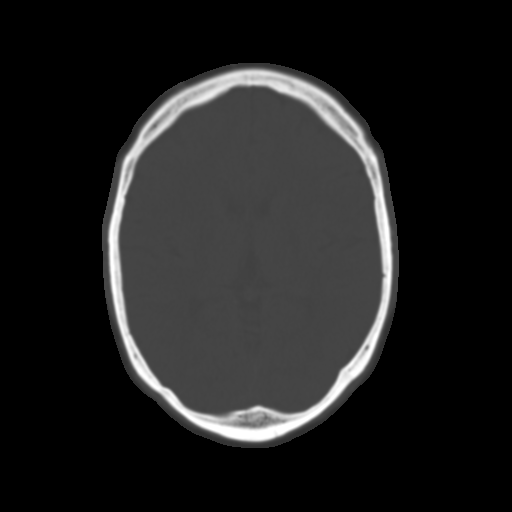
[im 19/33  brain]
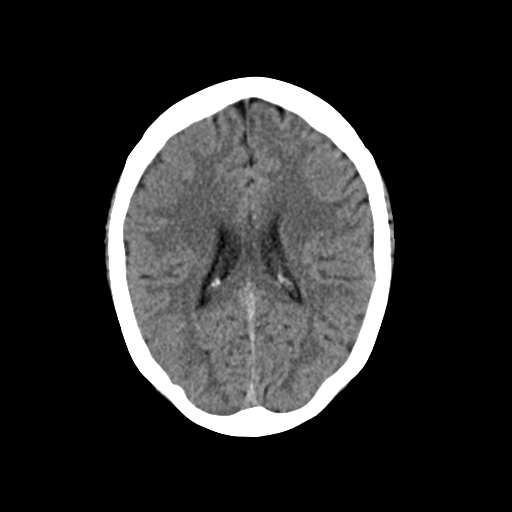
[im 21/33  brain]
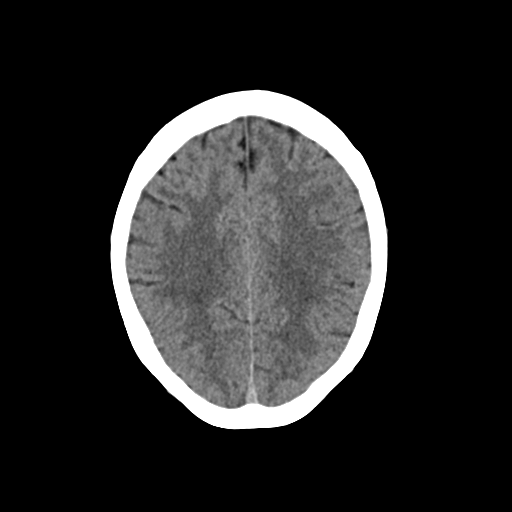
[im 24/33  brain]
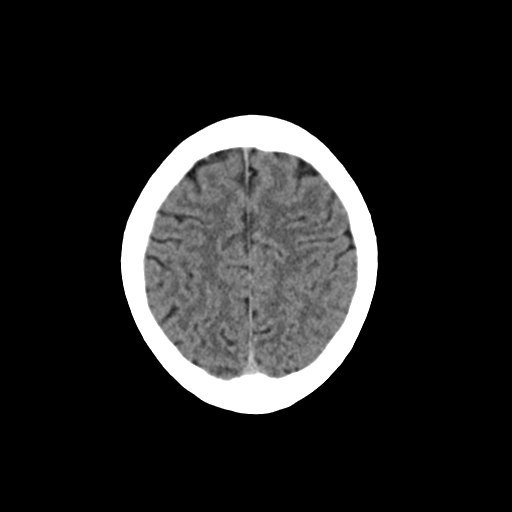
[im 25/33  brain]
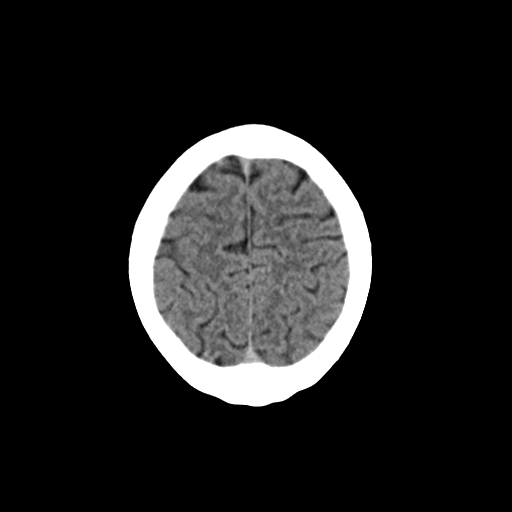
[im 25/33  bone]
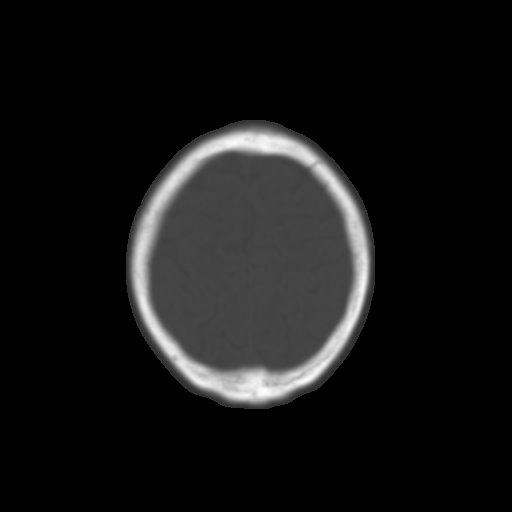
[im 27/33  brain]
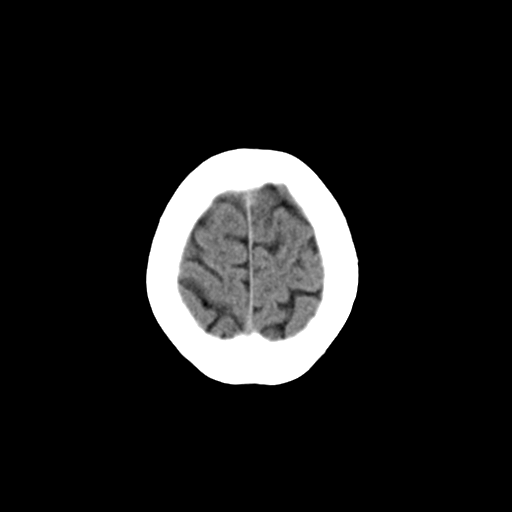
[im 29/33  brain]
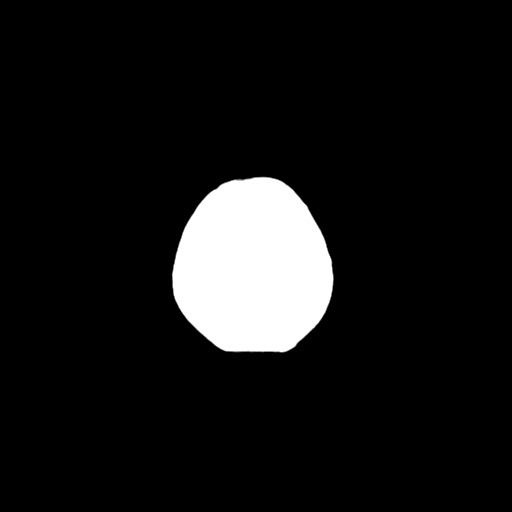
[im 31/33  brain]
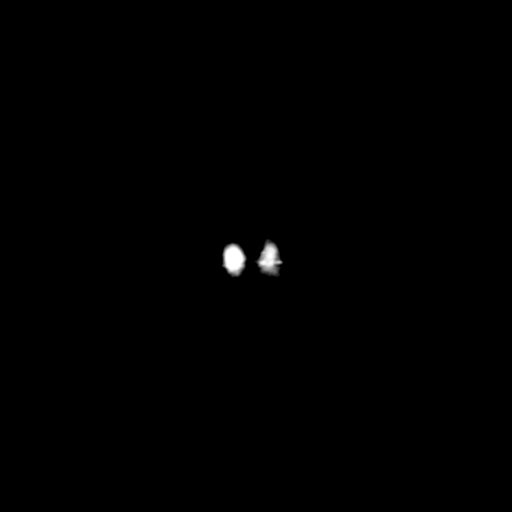

[16 of 30 positions shown; findings below may reference images not displayed]

FINDINGS: Brain: No evidence of acute infarction, hemorrhage, hydrocephalus,
extra-axial collection or mass lesion/mass effect.

Vascular: No hyperdense vessel or unexpected calcification.

Skull: Normal. Negative for fracture or focal lesion.

Sinuses/Orbits: No acute finding.

Other: None.
IMPRESSION: No acute intracranial abnormality.

## 2022-01-24 ENCOUNTER — Encounter: Payer: Self-pay | Admitting: Internal Medicine

## 2022-01-29 ENCOUNTER — Telehealth: Payer: Self-pay | Admitting: Internal Medicine

## 2022-01-29 ENCOUNTER — Telehealth: Payer: Self-pay

## 2022-01-29 NOTE — Telephone Encounter (Signed)
Lattie Haw is calling to get recommendation for pt having Near Syncope lasting 81mn at a time several times a day.  I transferred the call to team health

## 2022-01-29 NOTE — Telephone Encounter (Signed)
This nurse called the patient to inquire about her symptoms. Patient expressed having persistent dizziness even at rest and headaches. Patient states getting CT Scan and having labs but no diagnosis was presented. Patient is concerned about passing out and getting hurt. This nurse suggested going to he ED for further medical evaluation to which the patient refused. This nurse offered to make an appointment to see if a provider could recommend other test and/or recommendations. Patient was open to that idea. Scheduled patient for Monday 02/01/22 8 am appointment. This nurse made sure that patient agreed that if symptoms were to change in intensity or duration that she would be evaluated at ED. Patient agreed.

## 2022-01-29 NOTE — Telephone Encounter (Signed)
A phone RN called to inform us that she received a call from the Pt requesting a call from Dr. Ronnald Ramp.   Pt reported to the phone RN that she was having extreme dizziness, headaches, numbness/ tingling in both of her hands, and weakness.   Phone RN urged Pt to go to the ED, but the Pt refused. Pt Stated that she had already been to the ED and received a CT Scan and bloodwork but 'they didn't find anything'.   I asked our clinical supervisor, Elicia Lamp to reach out to the pt in [place]' absence.

## 2022-02-01 ENCOUNTER — Ambulatory Visit: Payer: BC Managed Care – PPO | Admitting: Family Medicine

## 2022-02-01 ENCOUNTER — Encounter: Payer: Self-pay | Admitting: Family Medicine

## 2022-02-01 VITALS — BP 126/84 | HR 82 | Temp 97.6°F | Ht 64.0 in | Wt 179.0 lb

## 2022-02-01 DIAGNOSIS — R519 Headache, unspecified: Secondary | ICD-10-CM | POA: Insufficient documentation

## 2022-02-01 DIAGNOSIS — J0140 Acute pansinusitis, unspecified: Secondary | ICD-10-CM

## 2022-02-01 DIAGNOSIS — R42 Dizziness and giddiness: Secondary | ICD-10-CM | POA: Diagnosis not present

## 2022-02-01 DIAGNOSIS — R110 Nausea: Secondary | ICD-10-CM | POA: Insufficient documentation

## 2022-02-01 MED ORDER — AMOXICILLIN 875 MG PO TABS: 875.0000 mg | ORAL_TABLET | Freq: Two times a day (BID) | ORAL | 0 refills | Status: AC

## 2022-02-01 MED ORDER — MECLIZINE HCL 25 MG PO TABS: 25.0000 mg | ORAL_TABLET | Freq: Two times a day (BID) | ORAL | 0 refills | Status: DC | PRN

## 2022-02-01 NOTE — Assessment & Plan Note (Signed)
Sinus tenderness along with marked nasal congestion and ear fullness.  Treat with amoxicillin.  I also recommend Flonase and she may continue pseudoephedrine as recommended at urgent care.  Recommend adequate water intake.  Follow-up if worsening or if not back to baseline when she completes the antibiotic.

## 2022-02-01 NOTE — Progress Notes (Signed)
Subjective:     Patient ID: [name], female    DOB: 07/01/1971, 51 y.o.   MRN: [id number]  Chief Complaint  Patient presents with   Headache    Has been going on for 3 weeks with accompanied dizzness   Dizziness    HPI Patient is in today for a 3 week history of headache and dizziness.  Dizziness is described as feeling unsteady with certain head movements. Occasional nausea.  Occasionally her ears feel pressure like when on an airplane.   Throbbing bilateral headache that is intermittent.   States she went to urgent care 3 days ago and was told to take pseudoephedrine because her ears are congested and she started this yesterday.   Recently seen by her PCP and had a negative CT head.   No new or worsening symptoms today.   Denies fever, chills, night sweats, vision changes, sore throat, chest pain, palpitations, shortness of breath, abdominal pain, N/V/D, urinary symptoms, LE edema.    Health Maintenance Due  Topic Date Due   OPHTHALMOLOGY EXAM  Never done   Zoster Vaccines- Shingrix (1 of 2) Never done   Fecal DNA (Cologuard)  08/30/2021    Past Medical History:  Diagnosis Date   CTEPH (chronic thromboembolic pulmonary hypertension) (HCC)    Diabetes mellitus without complication (HCC)    DVT (deep venous thrombosis) (HCC)    Elevated cholesterol    Hepatitis B infection    Hep B core Ab 04/2005   Primary hypercoagulable state ([place])    Pulmonary embolism ([place]) 08/03/2003       Pulmonary embolism (HCC)    Seasonal allergies     Past Surgical History:  Procedure Laterality Date   CARDIAC CATHETERIZATION  11/2005   R heart cath : Severe pulmonary arterial HTN with evidence of cor pulmonale. Vasodilatory challenge not performed.   Pulmonary artery endarterectomy  2007   VENA CAVA FILTER PLACEMENT      Family History  Problem Relation Age of Onset   Hypertension Mother    Hypertension Father        deceased    Social History   Socioeconomic  History   Marital status: Widowed    Spouse name: Not on file   Number of children: 3   Years of education: Not on file   Highest education level: Not on file  Occupational History    Employer: UNEMPLOYED  Tobacco Use   Smoking status: Never   Smokeless tobacco: Never  Vaping Use   Vaping Use: Never used  Substance and Sexual Activity   Alcohol use: No    Alcohol/week: 0.0 standard drinks of alcohol   Drug use: No   Sexual activity: Not Currently    Partners: Male    Birth control/protection: Post-menopausal  Other Topics Concern   Not on file  Social History Narrative   ** Merged History Encounter **       Pt lives in [place], braids hair for a living.    Social Determinants of Health   Financial Resource Strain: Not on file  Food Insecurity: Not on file  Transportation Needs: Not on file  Physical Activity: Not on file  Stress: Not on file  Social Connections: Not on file  Intimate Partner Violence: Not on file    Outpatient Medications Prior to Visit  Medication Sig Dispense Refill   acetaminophen (TYLENOL) 500 MG tablet Take 500 mg by mouth every 6 (six) hours as needed for headache (pain).  celecoxib (CELEBREX) 100 MG capsule Take 1 capsule (100 mg total) by mouth daily. 90 capsule 1   fluticasone (FLONASE) 50 MCG/ACT nasal spray Place 1 spray into both nostrils daily. 16 g 2   gabapentin (NEURONTIN) 100 MG capsule Take 2 capsules (200 mg total) by mouth at bedtime. 60 capsule 3   indapamide (LOZOL) 1.25 MG tablet Take 1 tablet (1.25 mg total) by mouth daily. 90 tablet 0   Multiple Vitamins-Minerals (CENTRUM ADULTS) TABS Take by mouth.     pseudoephedrine (SUDAFED) 60 MG tablet Take 60 mg by mouth every 6 (six) hours as needed for congestion.     rivaroxaban (XARELTO) 20 MG TABS tablet TAKE 1 TABLET BY MOUTH ONCE DAILY IN THE MORNING 30 tablet 2   rosuvastatin (CRESTOR) 10 MG tablet Take 1 tablet (10 mg total) by mouth daily. 90 tablet 1   No  facility-administered medications prior to visit.    Allergies  Allergen Reactions   Aspirin Other (See Comments)    Gi upset/ulcer    ROS Pertinent positives and negatives in the history of present illness.     Objective:    Physical Exam Constitutional:      General: She is not in acute distress.    Appearance: She is well-developed. She is not ill-appearing.  HENT:     Mouth/Throat:     Mouth: Mucous membranes are moist.  Eyes:     General: No visual field deficit.    Extraocular Movements: Extraocular movements intact.     Right eye: Normal extraocular motion.     Left eye: Normal extraocular motion.     Pupils: Pupils are equal, round, and reactive to light.  Cardiovascular:     Rate and Rhythm: Normal rate and regular rhythm.     Heart sounds: Normal heart sounds.  Pulmonary:     Effort: Pulmonary effort is normal.     Breath sounds: Normal breath sounds.  Musculoskeletal:        General: Normal range of motion.     Cervical back: Normal range of motion and neck supple.  Lymphadenopathy:     Cervical: No cervical adenopathy.  Skin:    General: Skin is warm and dry.  Neurological:     Mental Status: She is alert and oriented to person, place, and time.     Cranial Nerves: No cranial nerve deficit, dysarthria or facial asymmetry.     Sensory: No sensory deficit.     Motor: No weakness or pronator drift.     Coordination: Romberg sign negative. Coordination normal. Finger-Nose-Finger Test normal.     Gait: Gait normal.     Deep Tendon Reflexes: Reflexes normal.  Psychiatric:        Mood and Affect: Mood normal.        Speech: Speech normal.        Behavior: Behavior normal.     BP 126/84 (BP Location: Left Arm, Patient Position: Sitting, Cuff Size: Large)   Pulse 82   Temp 97.6 F (36.4 C) (Temporal)   Ht '5\' 4"'$  (1.626 m)   Wt 179 lb (81.2 kg)   SpO2 99%   BMI 30.73 kg/m  Wt Readings from Last 3 Encounters:  02/01/22 179 lb (81.2 kg)  01/21/22 178  lb (80.7 kg)  12/11/21 179 lb (81.2 kg)        Assessment & Plan:   Problem List Items Addressed This Visit       Respiratory   Acute non-recurrent pansinusitis  Sinus tenderness along with marked nasal congestion and ear fullness.  Treat with amoxicillin.  I also recommend Flonase and she may continue pseudoephedrine as recommended at urgent care.  Recommend adequate water intake.  Follow-up if worsening or if not back to baseline when she completes the antibiotic.      Relevant Medications   pseudoephedrine (SUDAFED) 60 MG tablet   amoxicillin (AMOXIL) 875 MG tablet     Other   Acute nonintractable headache    Suspect headache may be related to sinusitis.  She will let me or her PCP know if her headache is not resolving.  Reviewed recent CT head scan which was negative.      Dizziness - Primary    Negative neurological exam.  No red flag symptoms.  Dizziness triggered with certain eye and head movements today.  Suspect BPPV.  Negative CT head scan.  I will treat with meclizine and she will report back if not improving.      Relevant Medications   meclizine (ANTIVERT) 25 MG tablet   Nausea    Appears to be related to dizziness.  Anticipate this will resolve with meclizine but she will let me know.       I am having [name] start on amoxicillin and meclizine. I am also having her maintain her acetaminophen, Centrum Adults, indapamide, gabapentin, celecoxib, rosuvastatin, fluticasone, Xarelto, and pseudoephedrine.  Meds ordered this encounter  Medications   amoxicillin (AMOXIL) 875 MG tablet    Sig: Take 1 tablet (875 mg total) by mouth 2 (two) times daily for 10 days.    Dispense:  20 tablet    Refill:  0    Order Specific Question:   Supervising Provider    Answer:   Pricilla Holm A [[id number]]   meclizine (ANTIVERT) 25 MG tablet    Sig: Take 1 tablet (25 mg total) by mouth 2 (two) times daily as needed for dizziness.    Dispense:  30 tablet     Refill:  0    Order Specific Question:   Supervising Provider    Answer:   Pricilla Holm A [[id number]]

## 2022-02-01 NOTE — Assessment & Plan Note (Addendum)
Suspect headache may be related to sinusitis.  She will let me or her PCP know if her headache is not resolving.  Reviewed recent CT head scan which was negative.

## 2022-02-01 NOTE — Patient Instructions (Signed)
You appear to have a sinus infection. I have sent an antibiotic called Amoxicillin to your pharmacy. Take this as prescribed until finished.   Continue taking pseudoephedrine and Flonase.   You also seem to be experiencing a type of dizziness called vertigo which is an inner ear issue.  I have prescribed meclizine to try for this.  It is important to drink plenty of water with these medications.   Follow up for any new or worsening symptoms.

## 2022-02-01 NOTE — Assessment & Plan Note (Signed)
Appears to be related to dizziness.  Anticipate this will resolve with meclizine but she will let me know.

## 2022-02-01 NOTE — Assessment & Plan Note (Signed)
Negative neurological exam.  No red flag symptoms.  Dizziness triggered with certain eye and head movements today.  Suspect BPPV.  Negative CT head scan.  I will treat with meclizine and she will report back if not improving.

## 2022-02-12 ENCOUNTER — Other Ambulatory Visit: Payer: Self-pay | Admitting: Internal Medicine

## 2022-02-12 DIAGNOSIS — Z1231 Encounter for screening mammogram for malignant neoplasm of breast: Secondary | ICD-10-CM

## 2022-02-26 ENCOUNTER — Ambulatory Visit: Payer: BC Managed Care – PPO

## 2022-03-05 ENCOUNTER — Ambulatory Visit
Admission: RE | Admit: 2022-03-05 | Discharge: 2022-03-05 | Disposition: A | Discharge status: Home / Self Care | Payer: BC Managed Care – PPO | Source: Ambulatory Visit | Attending: Internal Medicine | Admitting: Internal Medicine

## 2022-03-05 ENCOUNTER — Ambulatory Visit: Payer: BC Managed Care – PPO

## 2022-03-05 DIAGNOSIS — Z1231 Encounter for screening mammogram for malignant neoplasm of breast: Secondary | ICD-10-CM

## 2022-03-05 DIAGNOSIS — G4734 Idiopathic sleep related nonobstructive alveolar hypoventilation: Secondary | ICD-10-CM

## 2022-03-05 DIAGNOSIS — G4733 Obstructive sleep apnea (adult) (pediatric): Secondary | ICD-10-CM

## 2022-03-11 ENCOUNTER — Telehealth: Payer: Self-pay | Admitting: Pulmonary Disease

## 2022-03-11 DIAGNOSIS — G4733 Obstructive sleep apnea (adult) (pediatric): Secondary | ICD-10-CM | POA: Diagnosis not present

## 2022-03-11 NOTE — Telephone Encounter (Signed)
Called and went over HST results with patient and she voiced understanding. Made appt with Gina Edison NP for 8/23. Nothing further needed at this time.

## 2022-03-11 NOTE — Telephone Encounter (Signed)
HST 03/07/22 >> AHI 26.5, SpO2 low 80%  Please inform her that her sleep study shows moderate obstructive sleep apnea.  Please arrange for ROV with me or NP to discuss treatment options.

## 2022-03-24 ENCOUNTER — Ambulatory Visit: Payer: BC Managed Care – PPO | Admitting: Adult Health

## 2022-03-24 ENCOUNTER — Encounter: Payer: Self-pay | Admitting: Adult Health

## 2022-03-24 VITALS — BP 120/78 | HR 82 | Temp 98.0°F | Ht 64.0 in | Wt 181.0 lb

## 2022-03-24 DIAGNOSIS — G4733 Obstructive sleep apnea (adult) (pediatric): Secondary | ICD-10-CM | POA: Diagnosis not present

## 2022-03-24 DIAGNOSIS — I2699 Other pulmonary embolism without acute cor pulmonale: Secondary | ICD-10-CM | POA: Diagnosis not present

## 2022-03-24 NOTE — Assessment & Plan Note (Signed)
History of recurrent PE and DVT, history of CTEPH On lifelong anticoagulation.  Appears to be doing well on Xarelto.  No changes

## 2022-03-24 NOTE — Progress Notes (Signed)
$'@Patient'L$  ID: [name], female    DOB: 1971/01/23, 51 y.o.   MRN: [id number]  Chief Complaint  Patient presents with   Follow-up    Referring provider: Janith Lima, MD  HPI: 51 year old female with known history of recurrent PE and DVT and chronic thromboembolic pulmonary hypertension on lifelong anticoagulation therapy.  Previous [facility]-up at [facility] with previous pulmonary endarterectomy in 2007  TEST/EVENTS :  ONO with RA 12/22/21 >> test time 7 hrs 56 min.  Basal SpO2 94.7%, low SpO2 77%.  Spent 3 min 36 sec with SpO2 < 88%.  HST 03/07/22 >> AHI 26.5, SpO2 low 80%   PFT 11/14/12 >> FEV1 2.52 (95%), FEV1% 92, TLC 3.75 (70%), DLCO 76%, no BD   Chest Imaging:  V/Q scan 03/18/06 >> multiple b/l large mismatched segmental perfusion defects from chronic PE CT chest 12/14/12 >> 3 mm RUL nodule, scar LUL posterior segment V/Q scan 06/06/14 >> very low probability for PE CT chest 01/16/15 >> mild dilation of pulmonary trunk CT angio chest 11/14/21 >> mild ATX, no PE   Cardiac Tests:  Echo 06/11/14 >> EF 55 to 60%, mild TR and PR it was good  03/24/2022 Follow up : Recurrent PE/DVT, OSA  Patient returns for 40-monthfollow-up.  Last visit patient was complaining of significant snoring, leg cramps and restless sleep.  She was set up for a overnight oximetry test that showed positive nocturnal hypoxemia.  She was then set up for home sleep study that was completed on March 07, 2022 that showed moderate to severe sleep apnea with AHI at 26.5/hour and SPO2 low at 80%.  We discussed her sleep study results.  Went over treatment options including weight loss and CPAP therapy.  Patient will proceed with CPAP therapy.  We looked at several different mask.  She would like to try the smallest mask , wants to try the DreamWear nasal mask. Says that she feels tired all the time never feels rested.  And has very restless sleep wakes up frequently.  Patient says she is doing well on  Xarelto.  Endorses compliance.  She has a history of recurrent PE and DVT.  History of CTEPH.      Allergies  Allergen Reactions   Aspirin Other (See Comments)    Gi upset/ulcer    Immunization History  Administered Date(s) Administered   Influenza Split 08/03/2011, 06/26/2012, 05/09/2013   Influenza Whole 09/28/2010   Influenza,inj,Quad PF,6+ Mos 06/05/2014, 06/30/2015, 05/05/2016, 08/17/2018, 05/29/2021   PFIZER(Purple Top)SARS-COV-2 Vaccination 11/02/2019, 11/30/2019   PNEUMOCOCCAL CONJUGATE-20 08/04/2021   PPD Test 03/17/2020   Pneumococcal Polysaccharide-23 05/25/2011, 08/25/2018   Tdap 06/26/2012    Past Medical History:  Diagnosis Date   CTEPH (chronic throm[place]embolic pulmonary hypertension) [place]    Diabetes mellitus without complication ([place]    DVT (deep venous thrombosis) (HCC)    Elevated cholesterol    Hepatitis B infection    Hep B core Ab 04/2005   Primary hypercoagulable state ([place]    [place] 08/03[place]2003       Pulmonary embolism (HCC)    Seasonal allergies     Tobacco History: Social History   Tobacco Use  Smoking Status Never  Smokeless Tobacco Never   Counseling given: Not Answered   Outpatient Medications Prior to Visit  Medication Sig Dispense Refill   acetaminophen (TYLENOL) 500 MG tablet Take 500 mg by mouth every 6 (six) hours as needed for headache (pain).     celecoxib (CELEBREX)  100 MG capsule Take 1 capsule (100 mg total) by mouth daily. 90 capsule 1   fluticasone (FLONASE) 50 MCG/ACT nasal spray Place 1 spray into both nostrils daily. 16 g 2   gabapentin (NEURONTIN) 100 MG capsule Take 2 capsules (200 mg total) by mouth at bedtime. 60 capsule 3   indapamide (LOZOL) 1.25 MG tablet Take 1 tablet (1.25 mg total) by mouth daily. 90 tablet 0   meclizine (ANTIVERT) 25 MG tablet Take 1 tablet (25 mg total) by mouth 2 (two) times daily as needed for dizziness. 30 tablet 0   Multiple Vitamins-Minerals (CENTRUM ADULTS) TABS Take  by mouth.     pseudoephedrine (SUDAFED) 60 MG tablet Take 60 mg by mouth every 6 (six) hours as needed for congestion.     rivaroxaban (XARELTO) 20 MG TABS tablet TAKE 1 TABLET BY MOUTH ONCE DAILY IN THE MORNING 30 tablet 2   rosuvastatin (CRESTOR) 10 MG tablet Take 1 tablet (10 mg total) by mouth daily. 90 tablet 1   No facility-administered medications prior to visit.     Review of Systems:   Constitutional:   No  weight loss, night sweats,  Fevers, chills,  +fatigue, or  lassitude.  HEENT:   No headaches,  Difficulty swallowing,  Tooth/dental problems, or  Sore throat,                No sneezing, itching, ear ache, nasal congestion, post nasal drip,   CV:  No chest pain,  Orthopnea, PND, swelling in lower extremities, anasarca, dizziness, palpitations, syncope.   GI  No heartburn, indigestion, abdominal pain, nausea, vomiting, diarrhea, change in bowel habits, loss of appetite, bloody stools.   Resp: No shortness of breath with exertion or at rest.  No excess mucus, no productive cough,  No non-productive cough,  No coughing up of blood.  No change in color of mucus.  No wheezing.  No chest wall deformity  Skin: no rash or lesions.  GU: no dysuria, change in color of urine, no urgency or frequency.  No flank pain, no hematuria   MS:  No joint pain or swelling.  No decreased range of motion.  No back pain.    Physical Exam  BP 120/78 (BP Location: Left Arm, Patient Position: Sitting, Cuff Size: Normal)   Pulse 82   Temp 98 F (36.7 C) (Oral)   Ht '5\' 4"'$  (1.626 m)   Wt 181 lb (82.1 kg)   SpO2 100% Comment: RA  BMI 31.07 kg/m   GEN: A/Ox3; pleasant , NAD, well nourished    HEENT:  [place]/AT,   NOSE-clear, THROAT-clear, no lesions, no postnasal drip or exudate noted. Class 3 MP airway   NECK:  Supple w/ fair ROM; no JVD; normal carotid impulses w/o bruits; no thyromegaly or nodules palpated; no lymphadenopathy.    RESP  Clear  P & A; w/o, wheezes/ rales/ or rhonchi. no  accessory muscle use, no dullness to percussion  CARD:  RRR, no m/r/g, no peripheral edema, pulses intact, no cyanosis or clubbing.  GI:   Soft & nt; nml bowel sounds; no organomegaly or masses detected.   Musco: Warm bil, no deformities or joint swelling noted.   Neuro: alert, no focal deficits noted.    Skin: Warm, no lesions or rashes    Lab Results:    BMET   BNP No results found for: "BNP"  ProBNP     No results found for: "NITRICOXIDE"      Assessment & Plan:  OSA (obstructive sleep apnea) Sleep study shows moderate sleep apnea.  Patient education on sleep apnea was discussed in detail.  Treatment options were discussed.  Patient will proceed with CPAP.  We will begin CPAP AutoSet 5 to 15 cm H2O.  We will try DreamWear nasal mask. - discussed how weight can impact sleep and risk for sleep disordered breathing - discussed options to assist with weight loss: combination of diet modification, cardiovascular and strength training exercises   - had an extensive discussion regarding the adverse health consequences related to untreated sleep disordered breathing - specifically discussed the risks for hypertension, coronary artery disease, cardiac dysrhythmias, cerebrovascular disease, and diabetes - lifestyle modification discussed   - discussed how sleep disruption can increase risk of accidents, particularly when driving - safe driving practices were discussed   Plan  Patient Instructions  Begin CPAP At bedtime  , wear all night long ,for at least >6 hr each night  Work on healthy weight loss.  Do not drive if sleepy.  Healthy sleep regimen   Continue on Xarelto '20mg'$  daily   Activity as tolerated   Follow up with Dr. Halford Chessman or Janalyn Higby NP in 2- 3 months and As needed       Recurrent pulmonary embolism ([place]) History of recurrent PE and DVT, history of CTEPH On lifelong anticoagulation.  Appears to be doing well on Xarelto.  No changes     Rexene Edison, NP 03/24/2022

## 2022-03-24 NOTE — Patient Instructions (Addendum)
Begin CPAP At bedtime  , wear all night long ,for at least >6 hr each night  Work on healthy weight loss.  Do not drive if sleepy.  Healthy sleep regimen   Continue on Xarelto '20mg'$  daily   Activity as tolerated   Follow up with Dr. Halford Chessman or Larin Depaoli NP in 2- 3 months and As needed

## 2022-03-24 NOTE — Assessment & Plan Note (Signed)
Sleep study shows moderate sleep apnea.  Patient education on sleep apnea was discussed in detail.  Treatment options were discussed.  Patient will proceed with CPAP.  We will begin CPAP AutoSet 5 to 15 cm H2O.  We will try DreamWear nasal mask. - discussed how weight can impact sleep and risk for sleep disordered breathing - discussed options to assist with weight loss: combination of diet modification, cardiovascular and strength training exercises   - had an extensive discussion regarding the adverse health consequences related to untreated sleep disordered breathing - specifically discussed the risks for hypertension, coronary artery disease, cardiac dysrhythmias, cerebrovascular disease, and diabetes - lifestyle modification discussed   - discussed how sleep disruption can increase risk of accidents, particularly when driving - safe driving practices were discussed   Plan  Patient Instructions  Begin CPAP At bedtime  , wear all night long ,for at least >6 hr each night  Work on healthy weight loss.  Do not drive if sleepy.  Healthy sleep regimen   Continue on Xarelto '20mg'$  daily   Activity as tolerated   Follow up with Dr. Halford Chessman or Eliaz Fout NP in 2- 3 months and As needed

## 2022-03-27 NOTE — Progress Notes (Signed)
Reviewed and agree with assessment/plan.   Chesley Mires, MD [facility] Pulmonary/Critical Care 03/27/2022, 2:24 PM Pager:  [phone]

## 2022-04-05 ENCOUNTER — Other Ambulatory Visit: Payer: Self-pay | Admitting: Pulmonary Disease

## 2022-06-03 ENCOUNTER — Other Ambulatory Visit (HOSPITAL_COMMUNITY)
Admission: RE | Admit: 2022-06-03 | Discharge: 2022-06-03 | Disposition: A | Discharge status: Home / Self Care | Payer: BC Managed Care – PPO | Source: Ambulatory Visit | Attending: Obstetrics & Gynecology | Admitting: Obstetrics & Gynecology

## 2022-06-03 ENCOUNTER — Ambulatory Visit (INDEPENDENT_AMBULATORY_CARE_PROVIDER_SITE_OTHER): Payer: BC Managed Care – PPO | Admitting: Obstetrics & Gynecology

## 2022-06-03 ENCOUNTER — Encounter: Payer: Self-pay | Admitting: Obstetrics & Gynecology

## 2022-06-03 VITALS — BP 118/80 | HR 78 | Ht 66.5 in | Wt 182.0 lb

## 2022-06-03 DIAGNOSIS — Z01419 Encounter for gynecological examination (general) (routine) without abnormal findings: Secondary | ICD-10-CM | POA: Diagnosis present

## 2022-06-03 DIAGNOSIS — Z780 Asymptomatic menopausal state: Secondary | ICD-10-CM | POA: Diagnosis not present

## 2022-06-03 DIAGNOSIS — Z86711 Personal history of pulmonary embolism: Secondary | ICD-10-CM

## 2022-06-03 NOTE — Progress Notes (Signed)
TED LEONHART 12-08-1970 [id number]   History:    51 y.o. G3P3L3 Widowed (husband Marquette Saa).  From [place].   RP:  New patient presenting for annual gyn exam    HPI: Postmenopause, well on no HRT given h/o PE on Xeralto.  No pelvic pain. Not sexually active x last year as she couldn't go to [place] where her fiance lives.  Pap Neg 05/2021.  Pap reflex today.  Breasts normal. Mammo Neg 03/2022.  Urine/BMs normal. BMI 28.94.  Health labs with Fam MD.  Recommend [facility] this year.   Past medical history,surgical history, family history and social history were all reviewed and documented in the EPIC chart.  Gynecologic History No LMP recorded. (Menstrual status: Perimenopausal).  Obstetric History OB History  Gravida Para Term Preterm AB Living  '4 3 3 '$ 0 1 3  SAB IAB Ectopic Multiple Live Births  1 0 0        # Outcome Date GA Lbr Len/2nd Weight Sex Delivery Anes PTL Lv  4 SAB           3 Term      Vag-Spont     2 Term      Vag-Spont     1 Term      Vag-Spont        ROS: A ROS was performed and pertinent positives and negatives are included in the history. GENERAL: No fevers or chills. HEENT: No change in vision, no earache, sore throat or sinus congestion. NECK: No pain or stiffness. CARDIOVASCULAR: No chest pain or pressure. No palpitations. PULMONARY: No shortness of breath, cough or wheeze. GASTROINTESTINAL: No abdominal pain, nausea, vomiting or diarrhea, melena or bright red blood per rectum. GENITOURINARY: No urinary frequency, urgency, hesitancy or dysuria. MUSCULOSKELETAL: No joint or muscle pain, no back pain, no recent trauma. DERMATOLOGIC: No rash, no itching, no lesions. ENDOCRINE: No polyuria, polydipsia, no heat or cold intolerance. No recent change in weight. HEMATOLOGICAL: No anemia or easy bruising or bleeding. NEUROLOGIC: No headache, seizures, numbness, tingling or weakness. PSYCHIATRIC: No depression, no loss of interest in normal activity or change in sleep pattern.      Exam:   BP 118/80   Pulse 78   Ht 5' 6.5" (1.689 m)   Wt 182 lb (82.6 kg)   SpO2 99%   BMI 28.94 kg/m   Body mass index is 28.94 kg/m.  General appearance : Well developed well nourished female. No acute distress HEENT: Eyes: no retinal hemorrhage or exudates,  Neck supple, trachea midline, no carotid bruits, no thyroidmegaly Lungs: Clear to auscultation, no rhonchi or wheezes, or rib retractions  Heart: Regular rate and rhythm, no murmurs or gallops Breast:Examined in sitting and supine position were symmetrical in appearance, no palpable masses or tenderness,  no skin retraction, no nipple inversion, no nipple discharge, no skin discoloration, no axillary or supraclavicular lymphadenopathy Abdomen: no palpable masses or tenderness, no rebound or guarding Extremities: no edema or skin discoloration or tenderness  Pelvic: Vulva: Normal             Vagina: No gross lesions or discharge  Cervix: No gross lesions or discharge.  Pap reflex done.  Uterus  AV, normal size, shape and consistency, non-tender and mobile  Adnexa  Without masses or tenderness  Anus: Normal   Assessment/Plan:  51 y.o. female for annual exam   1. Encounter for routine gynecological examination with Papanicolaou smear of cervix Postmenopause, well on no HRT given h/o PE on  Xeralto.  No pelvic pain. Not sexually active x last year as she couldn't go to [place] where her fiance lives.  Pap Neg 05/2021.  Pap reflex today.  Breasts normal. Mammo Neg 03/2022.  Urine/BMs normal. BMI 28.94.  Health labs with Fam MD.  Recommend [facility] this year. - Cytology - PAP( [place])  2. Postmenopause Postmenopause, well on no HRT given h/o PE on Xeralto.  No pelvic pain. Not sexually active x last year as she couldn't go to [place] where her fiance lives.  3. Hx pulmonary embolism  On Xeralto.  Princess Bruins MD, 8:48 AM 06/03/2022

## 2022-06-07 LAB — CYTOLOGY - PAP
Comment: NEGATIVE
High risk HPV: NEGATIVE

## 2022-06-10 ENCOUNTER — Encounter: Payer: Self-pay | Admitting: Adult Health

## 2022-06-10 ENCOUNTER — Ambulatory Visit (INDEPENDENT_AMBULATORY_CARE_PROVIDER_SITE_OTHER): Payer: BC Managed Care – PPO | Admitting: Adult Health

## 2022-06-10 VITALS — BP 118/82 | HR 80 | Temp 97.5°F | Ht 64.0 in | Wt 183.2 lb

## 2022-06-10 DIAGNOSIS — I2699 Other pulmonary embolism without acute cor pulmonale: Secondary | ICD-10-CM | POA: Diagnosis not present

## 2022-06-10 DIAGNOSIS — Z23 Encounter for immunization: Secondary | ICD-10-CM | POA: Diagnosis not present

## 2022-06-10 DIAGNOSIS — G4733 Obstructive sleep apnea (adult) (pediatric): Secondary | ICD-10-CM

## 2022-06-10 NOTE — Progress Notes (Signed)
$'@Patient'm$  ID: [name], female    DOB: 12-25-1970, 51 y.o.   MRN: [id number]  Chief Complaint  Patient presents with   Follow-up    Referring provider: Janith Lima, MD  HPI: 51 year old female with known history of recurrent PE/DVT and hx of chronic thromboembolic pulmonary hypertension (CTEPH) on lifelong anticoagulation therapy with Xarelto.  Previous work-up at [facility] with previous pulmonary endarterectomy in 2007  TEST/EVENTS :  ONO with RA 12/22/21 >> test time 7 hrs 56 min.  Basal SpO2 94.7%, low SpO2 77%.  Spent 3 min 36 sec with SpO2 < 88%.   HST 03/07/22 >> AHI 26.5, SpO2 low 80%    PFT 11/14/12 >> FEV1 2.52 (95%), FEV1% 92, TLC 3.75 (70%), DLCO 76%, no BD   Chest Imaging:  V/Q scan 03/18/06 >> multiple b/l large mismatched segmental perfusion defects from chronic PE CT chest 12/14/12 >> 3 mm RUL nodule, scar LUL posterior segment V/Q scan 06/06/14 >> very low probability for PE CT chest 01/16/15 >> mild dilation of pulmonary trunk CT angio chest 11/14/21 >> mild ATX, no PE   Cardiac Tests:  Echo 06/11/14 >> EF 55 to 60%, mild TR and PR it was good  06/10/2022 Follow up: Obstructive sleep apnea, recurrent PE/DVT Patient returns for 72-monthfollow-up.  Patient was recently diagnosed with moderate obstructive sleep apnea.  Home sleep study March 07, 2022 showed moderate to severe sleep apnea with AHI at 26.5/hour and SPO2 low at 80%.  Patient was started on CPAP therapy last visit.  Patient says she is doing better since starting CPAP.  She does not feel as sleepy.  She feels more rested.  CPAP download shows excellent compliance with daily average usage at 6.5 hours.  She has 100% compliance.  Patient is on auto CPAP 5 to 15 cm H2O.  Daily average pressure at 10.7 cm H2O.  AHI 0.4/hour.  Patient has a history of recurrent PE/DVT and CTEPH .  She remains on Xarelto.  Endorses compliance.  She denies any known bleeding.  No hemoptysis.  [place]  [place] will be [place] for 6 [place].   Allergies  Allergen Reactions   Aspirin Other (See Comments)    Gi upset/ulcer    Immunization History  Administered Date(s) Administered   Influenza Split 08/03/2011, 06/26/2012, 05/09/2013   Influenza Whole 09/28/2010   Influenza,inj,Quad PF,6+ Mos 06/05/2014, 06/30/2015, 05/05/2016, 08/17/2018, 05/29/2021, 06/10/2022   PFIZER(Purple Top)SARS-COV-2 Vaccination 11/02/2019, 11/30/2019   PNEUMOCOCCAL CONJUGATE-20 08/04/2021   PPD Test 03/17/2020   Pneumococcal Polysaccharide-23 05/25/2011, 08/25/2018   Tdap 06/26/2012    Past Medical History:  Diagnosis Date   CTEPH (chronic thromboembolic pulmonary hypertension) ([facility]    Diabetes mellitus without complication ([place]    DVT (deep venous thrombosis) (HCC)    Elevated cholesterol    Hepatitis B infection    Hep B core Ab 04/2005   Primary hypercoagulable state ([place]    Pulmonary embolism ([place] 08/03/2003       Pulmonary embolism (HCC)    Seasonal allergies     Tobacco History: Social History   Tobacco Use  Smoking Status Never  Smokeless Tobacco Never   Counseling given: Not Answered   Outpatient Medications Prior to Visit  Medication Sig Dispense Refill   acetaminophen (TYLENOL) 500 MG tablet Take 500 mg by mouth every 6 (six) hours as needed for headache (pain).     fluticasone (FLONASE) 50 MCG/ACT nasal spray Place  1 spray into both nostrils daily. 16 g 2   meclizine (ANTIVERT) 25 MG tablet Take 1 tablet (25 mg total) by mouth 2 (two) times daily as needed for dizziness. 30 tablet 0   Multiple Vitamins-Minerals (CENTRUM ADULTS) TABS Take by mouth.     pseudoephedrine (SUDAFED) 60 MG tablet Take 60 mg by mouth every 6 (six) hours as needed for congestion.     rivaroxaban (XARELTO) 20 MG TABS tablet TAKE 1 TABLET BY MOUTH ONCE DAILY IN THE MORNING 30 tablet 3   No facility-administered medications prior to visit.     Review of Systems:    Constitutional:   No  weight loss, night sweats,  Fevers, chills, fatigue, or  lassitude.  HEENT:   No headaches,  Difficulty swallowing,  Tooth/dental problems, or  Sore throat,                No sneezing, itching, ear ache, nasal congestion, post nasal drip,   CV:  No chest pain,  Orthopnea, PND, swelling in lower extremities, anasarca, dizziness, palpitations, syncope.   GI  No heartburn, indigestion, abdominal pain, nausea, vomiting, diarrhea, change in bowel habits, loss of appetite, bloody stools.   Resp: No shortness of breath with exertion or at rest.  No excess mucus, no productive cough,  No non-productive cough,  No coughing up of blood.  No change in color of mucus.  No wheezing.  No chest wall deformity  Skin: no rash or lesions.  GU: no dysuria, change in color of urine, no urgency or frequency.  No flank pain, no hematuria   MS:  No joint pain or swelling.  No decreased range of motion.  No back pain.    Physical Exam  BP 118/82 (BP Location: Left Arm, Patient Position: Sitting, Cuff Size: Normal)   Pulse 80   Temp (!) 97.5 F (36.4 C) (Oral)   Ht '5\' 4"'$  (1.626 m)   Wt 183 lb 3.2 oz (83.1 kg)   SpO2 99%   BMI 31.45 kg/m   GEN: A/Ox3; pleasant , NAD, well nourished    HEENT:  [place]/AT,  NOSE-clear, THROAT-clear, no lesions, no postnasal drip or exudate noted.   NECK:  Supple w/ fair ROM; no JVD; normal carotid impulses w/o bruits; no thyromegaly or nodules palpated; no lymphadenopathy.    RESP  Clear  P & A; w/o, wheezes/ rales/ or rhonchi. no accessory muscle use, no dullness to percussion  CARD:  RRR, no m/r/g, no peripheral edema, pulses intact, no cyanosis or clubbing.  GI:   Soft & nt; nml bowel sounds; no organomegaly or masses detected.   Musco: Warm bil, no deformities or joint swelling noted.   Neuro: alert, no focal deficits noted.    Skin: Warm, no lesions or rashes    Lab Results:   BNP No results found for:  "BNP"  ProBNP   Imaging: No results found.        No data to display          No results found for: "NITRICOXIDE"      Assessment & Plan:   Recurrent pulmonary embolism ([place]) History of recurrent PE/DVT, CTEPH on lifelong anticoagulation.  Appears to be doing well on Xarelto.  Endorses compliance.  No changes.  Plan  Patient Instructions  Continue on CPAP At bedtime  , wear all night long ,for at least >6 hr each night  Work on healthy weight loss.  Do not drive if sleepy.  Healthy sleep regimen  Flu shot today  Continue on Xarelto '20mg'$  daily   Activity as tolerated   Follow up with Dr. Halford Chessman or Freddi Forster NP in 4 months and As needed      OSA (obstructive sleep apnea) Moderate obstructive sleep apnea with excellent control and compliance on CPAP.  Patient is doing very well and has perceived benefit on CPAP.  Plan  Patient Instructions  Continue on CPAP At bedtime  , wear all night long ,for at least >6 hr each night  Work on healthy weight loss.  Do not drive if sleepy.  Healthy sleep regimen   Flu shot today  Continue on Xarelto '20mg'$  daily   Activity as tolerated   Follow up with Dr. Halford Chessman or Pearl Bents NP in 4 months and As needed   '     Gina Husby, NP 06/10/2022

## 2022-06-10 NOTE — Patient Instructions (Signed)
Continue on CPAP At bedtime  , wear all night long ,for at least >6 hr each night  Work on healthy weight loss.  Do not drive if sleepy.  Healthy sleep regimen   Flu shot today  Continue on Xarelto '20mg'$  daily   Activity as tolerated   Follow up with Dr. Halford Chessman or Meghin Thivierge NP in 4 months and As needed

## 2022-06-10 NOTE — Assessment & Plan Note (Signed)
History of recurrent PE/DVT, CTEPH on lifelong anticoagulation.  Appears to be doing well on Xarelto.  Endorses compliance.  No changes.  Plan  Patient Instructions  Continue on CPAP At bedtime  , wear all night long ,for at least >6 hr each night  Work on healthy weight loss.  Do not drive if sleepy.  Healthy sleep regimen   Flu shot today  Continue on Xarelto '20mg'$  daily   Activity as tolerated   Follow up with Dr. Halford Chessman or Lakeith Careaga NP in 4 months and As needed

## 2022-06-10 NOTE — Assessment & Plan Note (Signed)
Moderate obstructive sleep apnea with excellent control and compliance on CPAP.  Patient is doing very well and has perceived benefit on CPAP.  Plan  Patient Instructions  Continue on CPAP At bedtime  , wear all night long ,for at least >6 hr each night  Work on healthy weight loss.  Do not drive if sleepy.  Healthy sleep regimen   Flu shot today  Continue on Xarelto '20mg'$  daily   Activity as tolerated   Follow up with Dr. Halford Chessman or Irisha Grandmaison NP in 4 months and As needed   '

## 2022-06-10 NOTE — Progress Notes (Signed)
Reviewed and agree with assessment/plan.   Chesley Mires, MD [facility] Pulmonary/Critical Care 06/10/2022, 10:33 AM Pager:  [phone]

## 2022-07-30 ENCOUNTER — Other Ambulatory Visit: Payer: Self-pay | Admitting: Pulmonary Disease

## 2022-08-26 ENCOUNTER — Other Ambulatory Visit: Payer: Self-pay | Admitting: Pulmonary Disease

## 2022-09-22 ENCOUNTER — Other Ambulatory Visit: Payer: Self-pay | Admitting: Pulmonary Disease

## 2022-10-13 ENCOUNTER — Ambulatory Visit: Payer: BC Managed Care – PPO | Admitting: Internal Medicine

## 2022-10-13 ENCOUNTER — Ambulatory Visit (INDEPENDENT_AMBULATORY_CARE_PROVIDER_SITE_OTHER): Payer: BC Managed Care – PPO

## 2022-10-13 ENCOUNTER — Encounter: Payer: Self-pay | Admitting: Internal Medicine

## 2022-10-13 VITALS — BP 124/84 | HR 80 | Temp 98.5°F | Resp 16 | Ht 64.0 in | Wt 182.0 lb

## 2022-10-13 DIAGNOSIS — I10 Essential (primary) hypertension: Secondary | ICD-10-CM

## 2022-10-13 DIAGNOSIS — B181 Chronic viral hepatitis B without delta-agent: Secondary | ICD-10-CM | POA: Diagnosis not present

## 2022-10-13 DIAGNOSIS — R7989 Other specified abnormal findings of blood chemistry: Secondary | ICD-10-CM

## 2022-10-13 DIAGNOSIS — D508 Other iron deficiency anemias: Secondary | ICD-10-CM

## 2022-10-13 DIAGNOSIS — M79609 Pain in unspecified limb: Secondary | ICD-10-CM

## 2022-10-13 DIAGNOSIS — E785 Hyperlipidemia, unspecified: Secondary | ICD-10-CM

## 2022-10-13 DIAGNOSIS — E118 Type 2 diabetes mellitus with unspecified complications: Secondary | ICD-10-CM

## 2022-10-13 DIAGNOSIS — Z1211 Encounter for screening for malignant neoplasm of colon: Secondary | ICD-10-CM

## 2022-10-13 DIAGNOSIS — R202 Paresthesia of skin: Secondary | ICD-10-CM | POA: Diagnosis not present

## 2022-10-13 DIAGNOSIS — R079 Chest pain, unspecified: Secondary | ICD-10-CM

## 2022-10-13 DIAGNOSIS — R252 Cramp and spasm: Secondary | ICD-10-CM

## 2022-10-13 DIAGNOSIS — R0789 Other chest pain: Secondary | ICD-10-CM

## 2022-10-13 DIAGNOSIS — I739 Peripheral vascular disease, unspecified: Secondary | ICD-10-CM

## 2022-10-13 LAB — HEPATIC FUNCTION PANEL
ALT: 20 U/L (ref 0–35)
AST: 25 U/L (ref 0–37)
Albumin: 3.9 g/dL (ref 3.5–5.2)
Alkaline Phosphatase: 72 U/L (ref 39–117)
Bilirubin, Direct: 0.1 mg/dL (ref 0.0–0.3)
Total Bilirubin: 0.5 mg/dL (ref 0.2–1.2)
Total Protein: 7.3 g/dL (ref 6.0–8.3)

## 2022-10-13 LAB — URINALYSIS, ROUTINE W REFLEX MICROSCOPIC
Bilirubin Urine: NEGATIVE
Hgb urine dipstick: NEGATIVE
Ketones, ur: NEGATIVE
Leukocytes,Ua: NEGATIVE
Nitrite: NEGATIVE
RBC / HPF: NONE SEEN (ref 0–?)
Specific Gravity, Urine: 1.01 (ref 1.000–1.030)
Total Protein, Urine: NEGATIVE
Urine Glucose: NEGATIVE
Urobilinogen, UA: 0.2 (ref 0.0–1.0)
pH: 6.5 (ref 5.0–8.0)

## 2022-10-13 LAB — BASIC METABOLIC PANEL
BUN: 16 mg/dL (ref 6–23)
CO2: 30 mEq/L (ref 19–32)
Calcium: 9.6 mg/dL (ref 8.4–10.5)
Chloride: 102 mEq/L (ref 96–112)
Creatinine, Ser: 0.9 mg/dL (ref 0.40–1.20)
GFR: 72.82 mL/min (ref >60.00)
Glucose, Bld: 100 mg/dL — ABNORMAL HIGH (ref 70–99)
Potassium: 3.9 mEq/L (ref 3.5–5.1)
Sodium: 138 mEq/L (ref 135–145)

## 2022-10-13 LAB — CBC WITH DIFFERENTIAL/PLATELET
Basophils Absolute: 0 10*3/uL (ref 0.0–0.1)
Basophils Relative: 1.4 % (ref 0.0–3.0)
Eosinophils Absolute: 0.1 10*3/uL (ref 0.0–0.7)
Eosinophils Relative: 2.7 % (ref 0.0–5.0)
HCT: 42.8 % (ref 36.0–46.0)
Hemoglobin: 14.3 g/dL (ref 12.0–15.0)
Lymphocytes Relative: 52.6 % — ABNORMAL HIGH (ref 12.0–46.0)
Lymphs Abs: 1.6 10*3/uL (ref 0.7–4.0)
MCHC: 33.5 g/dL (ref 30.0–36.0)
MCV: 87.3 fl (ref 78.0–100.0)
Monocytes Absolute: 0.5 10*3/uL (ref 0.1–1.0)
Monocytes Relative: 18 % — ABNORMAL HIGH (ref 3.0–12.0)
Neutro Abs: 0.8 10*3/uL — ABNORMAL LOW (ref 1.4–7.7)
Neutrophils Relative %: 25.3 % — ABNORMAL LOW (ref 43.0–77.0)
Platelets: 231 10*3/uL (ref 150.0–400.0)
RBC: 4.91 Mil/uL (ref 3.87–5.11)
RDW: 13.1 % (ref 11.5–15.5)
WBC: 3 10*3/uL — ABNORMAL LOW (ref 4.0–10.5)

## 2022-10-13 LAB — FOLATE: Folate: 17.5 ng/mL (ref >5.9)

## 2022-10-13 LAB — CK: Total CK: 153 U/L (ref 7–177)

## 2022-10-13 LAB — MICROALBUMIN / CREATININE URINE RATIO
Creatinine,U: 61.2 mg/dL
Microalb Creat Ratio: 1.1 mg/g (ref 0.0–30.0)
Microalb, Ur: <0.7 mg/dL (ref 0.0–1.9)

## 2022-10-13 LAB — HEMOGLOBIN A1C: Hgb A1c MFr Bld: 6.4 % (ref 4.6–6.5)

## 2022-10-13 LAB — VITAMIN B12: Vitamin B-12: >1500 pg/mL — ABNORMAL HIGH (ref 211–911)

## 2022-10-13 LAB — TROPONIN I (HIGH SENSITIVITY): High Sens Troponin I: 3 ng/L (ref 2–17)

## 2022-10-13 LAB — MAGNESIUM: Magnesium: 2.1 mg/dL (ref 1.5–2.5)

## 2022-10-13 NOTE — Progress Notes (Signed)
Subjective:  Patient ID: [name], female    DOB: 1971-07-14  Age: 52 y.o. MRN: [id number]  CC: Anemia, Chest Pain, Hypertension, and Diabetes   HPI [name] presents for f/up  -----  She complains of a 1 month history of substernal chest pain that she describes as a stabbing sensation that radiates towards her back.  She also has nocturnal cramps, claudication in her extremities, and numbness, weakness, or tingling.  She is active and denies shortness of breath, diaphoresis, or cough.  Outpatient Medications Prior to Visit  Medication Sig Dispense Refill   acetaminophen (TYLENOL) 500 MG tablet Take 500 mg by mouth every 6 (six) hours as needed for headache (pain).     fluticasone (FLONASE) 50 MCG/ACT nasal spray Place 1 spray into both nostrils daily. 16 g 2   meclizine (ANTIVERT) 25 MG tablet Take 1 tablet (25 mg total) by mouth 2 (two) times daily as needed for dizziness. 30 tablet 0   Multiple Vitamins-Minerals (CENTRUM ADULTS) TABS Take by mouth.     pseudoephedrine (SUDAFED) 60 MG tablet Take 60 mg by mouth every 6 (six) hours as needed for congestion.     XARELTO 20 MG TABS tablet TAKE 1 TABLET BY MOUTH ONCE DAILY IN THE MORNING 30 tablet 0   No facility-administered medications prior to visit.    ROS Review of Systems  Constitutional: Negative.  Negative for chills, diaphoresis, fatigue and fever.  HENT: Negative.  Negative for sore throat and trouble swallowing.   Eyes: Negative.   Respiratory:  Negative for cough, chest tightness, shortness of breath and wheezing.   Cardiovascular:  Positive for chest pain. Negative for palpitations and leg swelling.  Gastrointestinal:  Negative for abdominal pain, diarrhea, nausea and vomiting.  Endocrine: Negative.   Genitourinary: Negative.  Negative for difficulty urinating.  Musculoskeletal: Negative.   Skin: Negative.   Neurological:  Positive for dizziness and numbness. Negative for weakness and  light-headedness.  Hematological:  Negative for adenopathy. Does not bruise/bleed easily.  Psychiatric/Behavioral: Negative.      Objective:  BP 124/84 (BP Location: Right Arm, Patient Position: Sitting, Cuff Size: Large)   Pulse 80   Temp 98.5 F (36.9 C) (Oral)   Ht 5\' 4"  (1.626 m)   Wt 182 lb (82.6 kg)   LMP 07/24/2018 (Exact Date)   SpO2 97%   BMI 31.24 kg/m   BP Readings from Last 3 Encounters:  10/13/22 124/84  06/10/22 118/82  06/03/22 118/80    Wt Readings from Last 3 Encounters:  10/13/22 182 lb (82.6 kg)  06/10/22 183 lb 3.2 oz (83.1 kg)  06/03/22 182 lb (82.6 kg)    Physical Exam Vitals reviewed.  Constitutional:      Appearance: Normal appearance. She is not ill-appearing.  HENT:     Mouth/Throat:     Mouth: Mucous membranes are moist.  Eyes:     General: No scleral icterus.    Conjunctiva/sclera: Conjunctivae normal.  Cardiovascular:     Rate and Rhythm: Normal rate.     Heart sounds: No murmur heard.    No friction rub. No gallop.     Comments: EKG- NSR, 78 bpm LAE, low voltage No LVH or Q waves Pulmonary:     Effort: Pulmonary effort is normal.     Breath sounds: No stridor. No wheezing, rhonchi or rales.  Abdominal:     General: Abdomen is flat.     Palpations: There is no mass.     Tenderness:  There is no abdominal tenderness. There is no guarding.     Hernia: No hernia is present.  Musculoskeletal:        General: Normal range of motion.     Cervical back: Neck supple.     Right lower leg: No edema.     Left lower leg: No edema.  Lymphadenopathy:     Cervical: No cervical adenopathy.  Skin:    General: Skin is warm and dry.     Coloration: Skin is not jaundiced.     Findings: No rash.  Neurological:     General: No focal deficit present.     Mental Status: She is alert. Mental status is at baseline.  Psychiatric:        Mood and Affect: Mood normal.        Behavior: Behavior normal.     Lab Results  Component Value Date    WBC 3.0 (L) 10/13/2022   HGB 14.3 10/13/2022   HCT 42.8 10/13/2022   PLT 231.0 10/13/2022   GLUCOSE 100 (H) 10/13/2022   CHOL 139 01/21/2022   TRIG 56.0 01/21/2022   HDL 55.60 01/21/2022   LDLCALC 72 01/21/2022   ALT 20 10/13/2022   AST 25 10/13/2022   NA 138 10/13/2022   K 3.9 10/13/2022   CL 102 10/13/2022   CREATININE 0.90 10/13/2022   BUN 16 10/13/2022   CO2 30 10/13/2022   TSH 2.63 01/21/2022   INR 2.80 03/20/2012   HGBA1C 6.4 10/13/2022   MICROALBUR <0.7 10/13/2022   DG Chest 2 View  Result Date: 10/13/2022 CLINICAL DATA:  Chest pain.  Prior median sternotomy in 2007. EXAM: CHEST - 2 VIEW COMPARISON:  11/14/2021 FINDINGS: Prior median sternotomy. Midline trachea. Normal heart size and mediastinal contours. No pleural effusion or pneumothorax. Clear lungs. Mild interstitial thickening is nonspecific but may relate to the EMR history of asthma. IMPRESSION: No active cardiopulmonary disease. Electronically Signed   By: Jeronimo Greaves M.D.   On: 10/13/2022 09:35     Assessment & Plan:   [name] was seen today for anemia, chest pain, hypertension and diabetes.  Diagnoses and all orders for this visit:  Chronic viral hepatitis B without delta agent and without coma (HCC)- Hep B antigen is negative. -     Hepatitis B surface antigen; Future -     Hepatic function panel; Future -     Hepatic function panel -     Hepatitis B surface antigen  Iron deficiency anemia secondary to inadequate dietary iron intake -     CBC with Differential/Platelet; Future -     CBC with Differential/Platelet  Type II diabetes mellitus with manifestations (HCC)- Her blood sugar is well-controlled. -     Hemoglobin A1c; Future -     Microalbumin / creatinine urine ratio; Future -     Basic metabolic panel; Future -     Basic metabolic panel -     Microalbumin / creatinine urine ratio -     Hemoglobin A1c -     HM Diabetes Foot Exam  Primary hypertension- Her blood pressure is  well-controlled. -     Urinalysis, Routine w reflex microscopic; Future -     Basic metabolic panel; Future -     Magnesium; Future -     Magnesium -     Basic metabolic panel -     Urinalysis, Routine w reflex microscopic  Stabbing chest pain- Labs are normal.  I recommended a CT chest with  contrast to evaluate for aortic aneurysm, pulmonary mass, and PE. -     Troponin I (High Sensitivity); Future -     D-dimer, quantitative; Future -     DG Chest 2 View; Future -     EKG 12-Lead -     D-dimer, quantitative -     Troponin I (High Sensitivity) -     CT Chest W Contrast; Future  Muscle cramps- Labs are reassuring. -     CK; Future -     Magnesium; Future -     Magnesium -     CK  Paresthesia and pain of extremity- Labs are negative for secondary causes. -     Vitamin B12; Future -     Vitamin B1; Future -     Folate; Future -     Folate -     Vitamin B1 -     Vitamin B12  Hyperlipidemia with target LDL less than 100- ASCVD risk is 1.9%. -     CK; Future -     CK  Colon cancer screening -     Ambulatory referral to Gastroenterology -     Cologuard  Claudication [facility]) -     VAS [place] ABI WITH/WO TBI; Future  Chest pain, unspecified type -     CT Chest W Contrast; Future  High serum vitamin B12 -     CT Chest W Contrast; Future   I am having Elexius T. Tamburo maintain her acetaminophen, Centrum Adults, fluticasone, pseudoephedrine, meclizine, and Xarelto.  No orders of the defined types were placed in this encounter.    Follow-up: No follow-ups on file.  Sanda Linger, MD

## 2022-10-17 ENCOUNTER — Encounter: Payer: Self-pay | Admitting: Internal Medicine

## 2022-10-17 LAB — VITAMIN B1: Vitamin B1 (Thiamine): 12 nmol/L (ref 8–30)

## 2022-10-17 LAB — D-DIMER, QUANTITATIVE: D-Dimer, Quant: 0.25 mcg/mL FEU (ref <0.50)

## 2022-10-17 LAB — HEPATITIS B SURFACE ANTIGEN: Hepatitis B Surface Ag: NONREACTIVE

## 2022-10-22 ENCOUNTER — Ambulatory Visit (HOSPITAL_COMMUNITY)
Admission: RE | Admit: 2022-10-22 | Discharge: 2022-10-22 | Disposition: A | Discharge status: Home / Self Care | Payer: BC Managed Care – PPO | Source: Ambulatory Visit | Attending: Internal Medicine | Admitting: Internal Medicine

## 2022-10-22 DIAGNOSIS — I739 Peripheral vascular disease, unspecified: Secondary | ICD-10-CM | POA: Diagnosis present

## 2022-10-23 LAB — VAS US ABI WITH/WO TBI
Left ABI: 1.12
Right ABI: 1.06

## 2022-10-25 ENCOUNTER — Encounter (HOSPITAL_BASED_OUTPATIENT_CLINIC_OR_DEPARTMENT_OTHER): Payer: Self-pay | Admitting: Pulmonary Disease

## 2022-10-25 ENCOUNTER — Ambulatory Visit (HOSPITAL_BASED_OUTPATIENT_CLINIC_OR_DEPARTMENT_OTHER): Payer: BC Managed Care – PPO | Admitting: Pulmonary Disease

## 2022-10-25 ENCOUNTER — Encounter: Payer: Self-pay | Admitting: Pulmonary Disease

## 2022-10-25 VITALS — BP 124/70 | HR 82 | Ht 64.0 in | Wt 179.8 lb

## 2022-10-25 DIAGNOSIS — G4733 Obstructive sleep apnea (adult) (pediatric): Secondary | ICD-10-CM

## 2022-10-25 DIAGNOSIS — I2699 Other pulmonary embolism without acute cor pulmonale: Secondary | ICD-10-CM

## 2022-10-25 DIAGNOSIS — I2724 Chronic thromboembolic pulmonary hypertension: Secondary | ICD-10-CM | POA: Diagnosis not present

## 2022-10-25 MED ORDER — RIVAROXABAN 20 MG PO TABS: 20.0000 mg | ORAL_TABLET | Freq: Every morning | ORAL | 5 refills | Status: DC

## 2022-10-25 NOTE — Patient Instructions (Signed)
Follow up in 6 months 

## 2022-10-25 NOTE — Progress Notes (Signed)
[place] [facility]monary, Critical Care, and Sleep Medicine  Chief Complaint  Patient presents with   Follow-up    Pt is here for follow up for osa. Pt states that cpap is working well with no issues noted at this time     Constitutional:  BP 124/70 (BP Location: Left Arm, Patient Position: Sitting, Cuff Size: Normal)   Pulse 82   Ht 5\' 4"  (1.626 m)   Wt 179 lb 12.8 oz (81.6 kg)   LMP 07/24/2018 (Exact Date)   SpO2 99%   BMI 30.86 kg/m   Past Medical History:  Hepatitis B, Season allergies, DM type 2, HTN, Headaches, Back pain  Past Surgical History:  She  has a past surgical history that includes Pulmonary artery endarterectomy (2007); Cardiac catheterization (11/2005); and Vena cava filter placement.  Brief Summary:  [name] is a 52 y.o. female with recurrent PE/DVT and chronic thromboembolic pulmonary hypertension on life long anticoagulation.  She had pulmonary endarterectomy at [facility] in 2007.  She also has obstructive sleep apnea.      Subjective:   She had home sleep study in August last year.  Found to have moderate sleep apnea.  Started on CPAP.  She say her PCP earlier this month.  Having chest pain.  D dimer was negative.  CT chest scheduled for later this month.  She is going to [place] later this month.  Physical Exam:   Appearance - well kempt   ENMT - no sinus tenderness, no oral exudate, no LAN, Mallampati 2 airway, no stridor  Respiratory - equal breath sounds bilaterally, no wheezing or rales  CV - s1s2 regular rate and rhythm, no murmurs  Ext - no clubbing, no edema  Skin - no rashes  Psych - normal mood and affect     Pulmonary testing:  PFT 11/14/12 >> FEV1 2.52 (95%), FEV1% 92, TLC 3.75 (70%), DLCO 76%, no BD  Chest Imaging:  V/Q scan 03/18/06 >> multiple b/l large mismatched segmental perfusion defects from chronic PE CT chest 12/14/12 >> 3 mm RUL nodule, scar LUL posterior segment V/Q scan 06/06/14 >> very low probability for PE CT  chest 01/16/15 >> mild dilation of pulmonary trunk CT angio chest 11/14/21 >> mild ATX, no PE  Sleep testing:  ONO with RA 12/22/21 >> test time 7 hrs 56 min. Basal SpO2 94.7%, low SpO2 77%. Spent 3 min 36 sec with SpO2 < 88%.  HST 03/07/22 >> AHI 26.5, SpO2 low 80%  Auto CPAP 09/22/22 to 10/21/22 >> used on 30 of 30 nights with average 6 hrs 20 min.  Average AHI 0.7 with median CPAP 7 and 95 th percentile CPAP 10 cm H2O  Cardiac Tests:  Echo 06/11/14 >> EF 55 to 60%, mild TR and PR  Social History:  She  reports that she has never smoked. She has never used smokeless tobacco. She reports that she does not drink alcohol and does not use drugs.  Family History:  Her family history includes Hypertension in her father and mother.     Assessment/Plan:   Chronic thromboembolic pulmonary hypertension. - she is on lifelong anticoagulation - continue xarelto 20 mg daily  Obstructive sleep apnea. - she is compliant with CPAP and reports benefit from therapy - she uses [employer] for her DME - current CPAP ordered in August 2023 - continue auto CPAP 5 to 15 cm H2O  Allergic rhinitis. - prn flonase  Travel advice. - she has regular trips to visit family in  [place] - seen by Dr. Scharlene Gloss with ID in August 2022    Time Spent Involved in Patient Care on Day of Examination:  35 minutes  Follow up:   Patient Instructions  Follow up in 6 months.  Medication List:   Allergies as of 10/25/2022       Reactions   Aspirin Other (See Comments)   Gi upset/ulcer        Medication List        Accurate as of October 25, 2022  8:49 AM. If you have any questions, ask your nurse or doctor.          acetaminophen 500 MG tablet Commonly known as: TYLENOL Take 500 mg by mouth every 6 (six) hours as needed for headache (pain).   Centrum Adults Tabs Take by mouth.   fluticasone 50 MCG/ACT nasal spray Commonly known as: FLONASE Place 1 spray into both nostrils daily.   meclizine 25 MG  tablet Commonly known as: ANTIVERT Take 1 tablet (25 mg total) by mouth 2 (two) times daily as needed for dizziness.   pseudoephedrine 60 MG tablet Commonly known as: SUDAFED Take 60 mg by mouth every 6 (six) hours as needed for congestion.   rivaroxaban 20 MG Tabs tablet Commonly known as: Xarelto Take 1 tablet (20 mg total) by mouth every morning. What changed: how much to take Changed by: Chesley Mires, MD        Signature:  Chesley Mires, MD [place] Pager - [phone] 10/25/2022, 8:49 AM

## 2022-10-26 ENCOUNTER — Ambulatory Visit
Admission: RE | Admit: 2022-10-26 | Discharge: 2022-10-26 | Disposition: A | Discharge status: Home / Self Care | Payer: BC Managed Care – PPO | Source: Ambulatory Visit | Attending: Internal Medicine | Admitting: Internal Medicine

## 2022-10-26 ENCOUNTER — Other Ambulatory Visit: Payer: Self-pay | Admitting: Family Medicine

## 2022-10-26 DIAGNOSIS — R0789 Other chest pain: Secondary | ICD-10-CM

## 2022-10-26 DIAGNOSIS — R7989 Other specified abnormal findings of blood chemistry: Secondary | ICD-10-CM

## 2022-10-26 DIAGNOSIS — R42 Dizziness and giddiness: Secondary | ICD-10-CM

## 2022-10-26 DIAGNOSIS — R079 Chest pain, unspecified: Secondary | ICD-10-CM

## 2022-10-26 MED ORDER — IOPAMIDOL (ISOVUE-300) INJECTION 61%
75.0000 mL | Freq: Once | INTRAVENOUS | Status: AC | PRN
  Administered 2022-10-26: 75 mL via INTRAVENOUS

## 2022-10-27 LAB — COLOGUARD: COLOGUARD: NEGATIVE

## 2022-11-01 ENCOUNTER — Encounter: Payer: Self-pay | Admitting: Internal Medicine

## 2023-03-18 ENCOUNTER — Telehealth: Payer: Self-pay | Admitting: Pulmonary Disease

## 2023-03-18 NOTE — Telephone Encounter (Signed)
Pharm calling to see if we got the form sent over for a possible replacement for Xarelto for this PT. They want [place] to RX Dabigatran.  First req sent 8/7 and she will refax.

## 2023-04-01 ENCOUNTER — Telehealth: Payer: Self-pay | Admitting: Pulmonary Disease

## 2023-04-01 ENCOUNTER — Other Ambulatory Visit (HOSPITAL_COMMUNITY): Payer: Self-pay

## 2023-04-01 NOTE — Telephone Encounter (Signed)
Not sure if this information is needed or helpful in any way but test claim shows that medications are a 100% patient pay unless they fill at a [facility] or a [facility]. I'm unable to see the co-pay it will be at those locations though.

## 2023-04-01 NOTE — Telephone Encounter (Signed)
Pharm calling to see if we got the form sent over for a possible replacement for Xarelto for this PT. They want [place] to RX Dabigatran.   First req sent 8/7 and she will refax.  Pharm # is [phone] Ref # [id number]  Also, they ask the form sent can be signed and refax'd.

## 2023-04-22 ENCOUNTER — Ambulatory Visit (HOSPITAL_BASED_OUTPATIENT_CLINIC_OR_DEPARTMENT_OTHER): Payer: BC Managed Care – PPO | Admitting: Pulmonary Disease

## 2023-04-22 ENCOUNTER — Encounter (HOSPITAL_BASED_OUTPATIENT_CLINIC_OR_DEPARTMENT_OTHER): Payer: Self-pay | Admitting: Pulmonary Disease

## 2023-04-22 VITALS — BP 128/78 | HR 94 | Resp 16 | Ht 64.0 in | Wt 185.0 lb

## 2023-04-22 DIAGNOSIS — G4733 Obstructive sleep apnea (adult) (pediatric): Secondary | ICD-10-CM

## 2023-04-22 DIAGNOSIS — I2724 Chronic thromboembolic pulmonary hypertension: Secondary | ICD-10-CM

## 2023-04-22 DIAGNOSIS — I2699 Other pulmonary embolism without acute cor pulmonale: Secondary | ICD-10-CM | POA: Diagnosis not present

## 2023-04-22 MED ORDER — RIVAROXABAN 20 MG PO TABS: 20.0000 mg | ORAL_TABLET | Freq: Every morning | ORAL | 5 refills | Status: DC

## 2023-04-22 NOTE — Patient Instructions (Signed)
Follow up in 1 year.

## 2023-04-22 NOTE — Progress Notes (Signed)
[facility]  Chief Complaint  Patient presents with   Follow-up    Doing good    Constitutional:  BP 128/78   Pulse 94   Resp 16   Ht 5\' 4"  (1.626 m)   Wt 185 lb (83.9 kg)   LMP 07/24/2018 (Exact Date)   SpO2 98%   BMI 31.76 kg/m   Past Medical History:  Hepatitis B, Season allergies, DM type 2, HTN, Headaches, Back pain  Past Surgical History:  She  has a past surgical history that includes Pulmonary artery endarterectomy (2007); Cardiac catheterization (11/2005); and Vena cava filter placement.  Brief Summary:  [name] is a 52 y.o. female with recurrent PE/DVT and chronic thromboembolic pulmonary hypertension on life long anticoagulation.  She had pulmonary endarterectomy at [facility] in 2007.  She also has obstructive sleep apnea.      Subjective:   She got back from [place] earlier this week.  Still getting over the jet lag.  She had trouble using CPAP there due to inconsistent electrical supply.    Not having bruising, gum bleeding, GI bleeding, chest pain, leg swelling.    Physical Exam:   Appearance - well kempt   ENMT - no sinus tenderness, no oral exudate, no LAN, Mallampati 2 airway, no stridor  Respiratory - equal breath sounds bilaterally, no wheezing or rales  CV - s1s2 regular rate and rhythm, no murmurs  Ext - no clubbing, no edema  Skin - no rashes  Psych - normal mood and affect     Pulmonary testing:  PFT 11/14/12 >> FEV1 2.52 (95%), FEV1% 92, TLC 3.75 (70%), DLCO 76%, no BD  Chest Imaging:  V/Q scan 03/18/06 >> multiple b/l large mismatched segmental perfusion defects from chronic PE CT chest 12/14/12 >> 3 mm RUL nodule, scar LUL posterior segment V/Q scan 06/06/14 >> very low probability for PE CT chest 01/16/15 >> mild dilation of pulmonary trunk CT angio chest 11/14/21 >> mild ATX, no PE  Sleep testing:  ONO with RA 12/22/21 >> test time 7 hrs 56 min. Basal SpO2 94.7%, low SpO2 77%. Spent  3 min 36 sec with SpO2 < 88%.  HST 03/07/22 >> AHI 26.5, SpO2 low 80%  Auto CPAP 03/23/23 to 04/21/23 >> used on 25 of 30 nights with average 4 hrs 20 min.  Average AHI 0.4 with median CPAP 7 and 95 th percentile CPAP 10 cm H2O  Cardiac Tests:  Echo 06/11/14 >> EF 55 to 60%, mild TR and PR  Social History:  She  reports that she has never smoked. She has never been exposed to tobacco smoke. She has never used smokeless tobacco. She reports that she does not drink alcohol and does not use drugs.  Family History:  Her family history includes Hypertension in her father and mother.     Assessment/Plan:   Chronic thromboembolic pulmonary hypertension. - she is on lifelong anticoagulation - continue xarelto 20 mg daily  Obstructive sleep apnea. - she is compliant with CPAP and reports benefit from therapy - she uses [employer] for her DME - current CPAP ordered in August 2023 - continue auto CPAP 5 to 15 cm H2O  Allergic rhinitis. - prn flonase  Travel advice. - she has regular trips to visit family in [place] - seen by Dr. Staci Righter with ID in August 2022    Time Spent Involved in Patient Care on Day of Examination:  17 minutes  Follow up:  Patient Instructions  Follow up in 1 year  Medication List:   Allergies as of 04/22/2023       Reactions   Aspirin Other (See Comments)   Gi upset/ulcer        Medication List        Accurate as of April 22, 2023  1:32 PM. If you have any questions, ask your nurse or doctor.          STOP taking these medications    meclizine 25 MG tablet Commonly known as: ANTIVERT Stopped by: Coralyn Helling       TAKE these medications    acetaminophen 500 MG tablet Commonly known as: TYLENOL Take 500 mg by mouth every 6 (six) hours as needed for headache (pain).   Centrum Adults Tabs Take by mouth.   fluticasone 50 MCG/ACT nasal spray Commonly known as: FLONASE Place 1 spray into both nostrils daily.   pseudoephedrine 60  MG tablet Commonly known as: SUDAFED Take 60 mg by mouth every 6 (six) hours as needed for congestion. OTC only   rivaroxaban 20 MG Tabs tablet Commonly known as: Xarelto Take 1 tablet (20 mg total) by mouth every morning.        Signature:  Coralyn Helling, MD [facility] Pager - [phone] 04/22/2023, 1:32 PM

## 2023-05-06 ENCOUNTER — Telehealth: Payer: Self-pay | Admitting: Pulmonary Disease

## 2023-05-18 NOTE — Telephone Encounter (Addendum)
Called [employer].  Checking to verify that fax was sent regarding fax for change from Xarelto to Pradaxa.  Did not find a fax.  Requested fax be resent.  Will refax request to Rubye Oaks, NP at [phone] (in [place]).

## 2023-06-29 ENCOUNTER — Other Ambulatory Visit: Payer: Self-pay | Admitting: *Deleted

## 2023-07-07 ENCOUNTER — Ambulatory Visit: Payer: BC Managed Care – PPO | Admitting: Nurse Practitioner

## 2023-07-07 VITALS — BP 128/84 | HR 77 | Temp 97.7°F | Ht 64.0 in | Wt 184.1 lb

## 2023-07-07 DIAGNOSIS — R42 Dizziness and giddiness: Secondary | ICD-10-CM

## 2023-07-07 LAB — POCT INFLUENZA A/B

## 2023-07-07 LAB — CBC
HCT: 44.4 % (ref 36.0–46.0)
Hemoglobin: 14.7 g/dL (ref 12.0–15.0)
MCHC: 33.1 g/dL (ref 30.0–36.0)
MCV: 88.2 fL (ref 78.0–100.0)
Platelets: 235 10*3/uL (ref 150.0–400.0)
RBC: 5.03 Mil/uL (ref 3.87–5.11)
RDW: 13.6 % (ref 11.5–15.5)
WBC: 4 10*3/uL (ref 4.0–10.5)

## 2023-07-07 LAB — POC COVID19 BINAXNOW: SARS Coronavirus 2 Ag: NEGATIVE

## 2023-07-07 LAB — COMPREHENSIVE METABOLIC PANEL
ALT: 20 U/L (ref 0–35)
AST: 27 U/L (ref 0–37)
Albumin: 4.2 g/dL (ref 3.5–5.2)
Alkaline Phosphatase: 86 U/L (ref 39–117)
BUN: 11 mg/dL (ref 6–23)
CO2: 31 meq/L (ref 19–32)
Calcium: 9.8 mg/dL (ref 8.4–10.5)
Chloride: 104 meq/L (ref 96–112)
Creatinine, Ser: 0.88 mg/dL (ref 0.40–1.20)
GFR: 74.42 mL/min (ref >60.00)
Glucose, Bld: 107 mg/dL — ABNORMAL HIGH (ref 70–99)
Potassium: 4.4 meq/L (ref 3.5–5.1)
Sodium: 140 meq/L (ref 135–145)
Total Bilirubin: 0.8 mg/dL (ref 0.2–1.2)
Total Protein: 7.7 g/dL (ref 6.0–8.3)

## 2023-07-07 LAB — TSH: TSH: 2.53 u[IU]/mL (ref 0.35–5.50)

## 2023-07-07 LAB — POCT RESPIRATORY SYNCYTIAL VIRUS: RSV Rapid Ag: NEGATIVE

## 2023-07-07 LAB — HEMOGLOBIN A1C: Hgb A1c MFr Bld: 6.5 % (ref 4.6–6.5)

## 2023-07-07 MED ORDER — MECLIZINE HCL 25 MG PO TABS: 25.0000 mg | ORAL_TABLET | Freq: Three times a day (TID) | ORAL | 1 refills | Status: DC | PRN

## 2023-07-07 NOTE — Progress Notes (Signed)
Established Patient Office Visit  Subjective   Patient ID: [name], female    DOB: 05-Aug-1970  Age: 52 y.o. MRN: [id number]  Chief Complaint  Patient presents with   Dizziness    Been going for about 4 days, dizzy all the time not matter what she is doing    She arrives today for acute visit for the above.  Reports for about 4 days she has been feeling dizzy continuously, severity has waxed and waned.  Past medical history is pertinent for chronic thromboembolic pulmonary hypertension, hypertension, recurrent pulmonary embolisms (on long-term anticoagulation -Xarelto), allergic rhinitis, asthma, OSA, type 2 diabetes, degenerative disc disease of the lumbar spine, antiphospholipid syndrome, iron deficiency anemia, hyperlipidemia.   Aggravating factors include turning her head.  She also reports a feeling of being off balance.  Has had nausea, but no vomiting.  No cough, no fever, no rhinorrhea, positive postnasal drip and a feeling of pressure in her ears, no slurred speech, no weakness, no acute sensory changes (reports chronic intermittent tingling of her upper extremities that have not worsened).      Review of Systems  HENT:  Negative for congestion.        (+) PND, ear pressure  Respiratory:  Negative for cough and shortness of breath.   Cardiovascular:  Negative for chest pain.  Gastrointestinal:  Positive for nausea. Negative for vomiting.  Neurological:  Positive for dizziness. Negative for sensory change (has chronic intermittent tingling in upper extremities), speech change, loss of consciousness and weakness.      Objective:     BP 128/84   Pulse 77   Temp 97.7 F (36.5 C) (Temporal)   Ht 5\' 4"  (1.626 m)   Wt 184 lb 2 oz (83.5 kg)   LMP 07/24/2018 (Exact Date)   SpO2 98%   BMI 31.60 kg/m  BP Readings from Last 3 Encounters:  07/07/23 128/84  04/22/23 128/78  10/25/22 124/70   Wt Readings from Last 3 Encounters:  07/07/23 184 lb 2 oz (83.5 kg)   04/22/23 185 lb (83.9 kg)  10/25/22 179 lb 12.8 oz (81.6 kg)    Orthostatic VS: 130/86, 72 supine; 130/86, 73 sitting; 130/88, 88 standing  Physical Exam Vitals reviewed.  Constitutional:      General: She is not in acute distress.    Appearance: Normal appearance.  HENT:     Head: Normocephalic and atraumatic.     Right Ear: Hearing, tympanic membrane, ear canal and external ear normal.     Left Ear: Hearing, tympanic membrane, ear canal and external ear normal.  Eyes:     Extraocular Movements: Extraocular movements intact.     Right eye: No nystagmus.     Left eye: No nystagmus.  Neck:     Vascular: No carotid bruit.  Cardiovascular:     Rate and Rhythm: Normal rate and regular rhythm.     Pulses: Normal pulses.     Heart sounds: Normal heart sounds.  Pulmonary:     Effort: Pulmonary effort is normal.     Breath sounds: Normal breath sounds.  Skin:    General: Skin is warm and dry.  Neurological:     General: No focal deficit present.     Mental Status: She is alert and oriented to person, place, and time.     Cranial Nerves: Cranial nerves 2-12 are intact.     Sensory: Sensation is intact.     Motor: Motor function is intact. No pronator drift.  Coordination: Coordination is intact.     Gait: Gait is intact.  Psychiatric:        Mood and Affect: Mood normal.        Behavior: Behavior normal.        Judgment: Judgment normal.    EKG: NSR, nonspecific T wave abnromality but no ST-segment elevations  Results for orders placed or performed in visit on 07/07/23  POCT Influenza A/B  Result Value Ref Range   Influenza A, POC     Influenza B, POC    POC COVID-19  Result Value Ref Range   SARS Coronavirus 2 Ag Negative Negative  POCT respiratory syncytial virus  Result Value Ref Range   RSV Rapid Ag negative       The 10-year ASCVD risk score (Arnett DK, et al., 2019) is: 2.3%    Assessment & Plan:   Problem List Items Addressed This Visit        Other   Dizziness - Primary    Acute, lasting for 4 days, intensity waxes and wanes.  Etiology unclear No red flags noted on normal neuro exam today. On Xarelto chronically. Does describe equilibrium but sensation of dizziness is significantly exacerbated by movement which seems more consistent with BPPV. EKG stable without arrhythmia noted, orthostatic vs negative. Will check labs today for further evaluation. While waiting for results, will trial meclizine. If no symptom improvement or if symptoms worsen check imaging of head and refer to neurology. Patient educated on plan, is agreeable, and is aware to call office if she does not improve.       Relevant Medications   meclizine (ANTIVERT) 25 MG tablet   Other Relevant Orders   EKG 12-Lead   CBC   Comprehensive metabolic panel   Hemoglobin A1c   TSH   POCT Influenza A/B (Completed)   POC COVID-19 (Completed)   POCT respiratory syncytial virus (Completed)    Return if symptoms worsen or fail to improve within the next week. In addition to time spent on EKG total encounter time today was 40 minutes including review of records, face-to-face evaluation, and development/discussion of treatment plan.    Elenore Paddy, NP

## 2023-07-07 NOTE — Assessment & Plan Note (Addendum)
Acute, lasting for 4 days, intensity waxes and wanes.  Etiology unclear No red flags noted on normal neuro exam today. On Xarelto chronically. Does describe equilibrium but sensation of dizziness is significantly exacerbated by movement which seems more consistent with BPPV. EKG stable without arrhythmia noted, orthostatic vs negative. POC flu, RSV, Covid all negative.  Will check labs today for further evaluation. While waiting for results, will trial meclizine. If no symptom improvement or if symptoms worsen check imaging of head and refer to neurology. Patient educated on plan, is agreeable, and is aware to call office if she does not improve.

## 2023-07-08 ENCOUNTER — Telehealth: Payer: Self-pay | Admitting: Internal Medicine

## 2023-07-08 NOTE — Telephone Encounter (Signed)
Patient returned Grace's call regarding her results and would like a call back at [phone].

## 2023-07-11 NOTE — Telephone Encounter (Signed)
Left her voice mail to call back for lab result

## 2023-10-20 ENCOUNTER — Other Ambulatory Visit: Payer: Self-pay | Admitting: Internal Medicine

## 2023-10-20 DIAGNOSIS — Z1231 Encounter for screening mammogram for malignant neoplasm of breast: Secondary | ICD-10-CM

## 2023-11-04 ENCOUNTER — Ambulatory Visit
Admission: RE | Admit: 2023-11-04 | Discharge: 2023-11-04 | Disposition: A | Discharge status: Home / Self Care | Source: Ambulatory Visit | Attending: Internal Medicine | Admitting: Internal Medicine

## 2023-11-04 DIAGNOSIS — Z1231 Encounter for screening mammogram for malignant neoplasm of breast: Secondary | ICD-10-CM

## 2023-11-28 ENCOUNTER — Encounter: Payer: Self-pay | Admitting: Internal Medicine

## 2023-11-28 ENCOUNTER — Ambulatory Visit (INDEPENDENT_AMBULATORY_CARE_PROVIDER_SITE_OTHER): Admitting: Internal Medicine

## 2023-11-28 VITALS — BP 134/86 | HR 78 | Temp 97.9°F | Resp 16 | Ht 64.0 in | Wt 188.6 lb

## 2023-11-28 DIAGNOSIS — M51362 Other intervertebral disc degeneration, lumbar region with discogenic back pain and lower extremity pain: Secondary | ICD-10-CM

## 2023-11-28 DIAGNOSIS — D6861 Antiphospholipid syndrome: Secondary | ICD-10-CM

## 2023-11-28 DIAGNOSIS — Z0001 Encounter for general adult medical examination with abnormal findings: Secondary | ICD-10-CM

## 2023-11-28 DIAGNOSIS — Z23 Encounter for immunization: Secondary | ICD-10-CM

## 2023-11-28 DIAGNOSIS — Z0000 Encounter for general adult medical examination without abnormal findings: Secondary | ICD-10-CM | POA: Diagnosis not present

## 2023-11-28 DIAGNOSIS — I2724 Chronic thromboembolic pulmonary hypertension: Secondary | ICD-10-CM

## 2023-11-28 DIAGNOSIS — E785 Hyperlipidemia, unspecified: Secondary | ICD-10-CM | POA: Diagnosis not present

## 2023-11-28 DIAGNOSIS — I10 Essential (primary) hypertension: Secondary | ICD-10-CM

## 2023-11-28 DIAGNOSIS — B181 Chronic viral hepatitis B without delta-agent: Secondary | ICD-10-CM

## 2023-11-28 DIAGNOSIS — I2699 Other pulmonary embolism without acute cor pulmonale: Secondary | ICD-10-CM

## 2023-11-28 DIAGNOSIS — E118 Type 2 diabetes mellitus with unspecified complications: Secondary | ICD-10-CM

## 2023-11-28 LAB — URINALYSIS, ROUTINE W REFLEX MICROSCOPIC
Bilirubin Urine: NEGATIVE
Hgb urine dipstick: NEGATIVE
Ketones, ur: NEGATIVE
Leukocytes,Ua: NEGATIVE
Nitrite: NEGATIVE
RBC / HPF: NONE SEEN
Specific Gravity, Urine: 1.01 (ref 1.000–1.030)
Total Protein, Urine: NEGATIVE
Urine Glucose: NEGATIVE
Urobilinogen, UA: 0.2 (ref 0.0–1.0)
pH: 6 (ref 5.0–8.0)

## 2023-11-28 LAB — HEMOGLOBIN A1C: Hgb A1c MFr Bld: 6.3 % (ref 4.6–6.5)

## 2023-11-28 LAB — LIPID PANEL
Cholesterol: 209 mg/dL — ABNORMAL HIGH (ref 0–200)
HDL: 50 mg/dL (ref >39.00)
LDL Cholesterol: 135 mg/dL — ABNORMAL HIGH (ref 0–99)
NonHDL: 158.51
Total CHOL/HDL Ratio: 4
Triglycerides: 116 mg/dL (ref 0.0–149.0)
VLDL: 23.2 mg/dL (ref 0.0–40.0)

## 2023-11-28 LAB — MICROALBUMIN / CREATININE URINE RATIO
Creatinine,U: 52.3 mg/dL
Microalb Creat Ratio: UNDETERMINED mg/g (ref 0.0–30.0)
Microalb, Ur: <0.7 mg/dL

## 2023-11-28 LAB — BASIC METABOLIC PANEL WITH GFR
BUN: 17 mg/dL (ref 6–23)
CO2: 28 meq/L (ref 19–32)
Calcium: 9.3 mg/dL (ref 8.4–10.5)
Chloride: 101 meq/L (ref 96–112)
Creatinine, Ser: 0.93 mg/dL (ref 0.40–1.20)
GFR: 69.46 mL/min
Glucose, Bld: 98 mg/dL (ref 70–99)
Potassium: 3.9 meq/L (ref 3.5–5.1)
Sodium: 136 meq/L (ref 135–145)

## 2023-11-28 MED ORDER — TIZANIDINE HCL 2 MG PO TABS: 2.0000 mg | ORAL_TABLET | Freq: Three times a day (TID) | ORAL | 0 refills | Status: AC | PRN

## 2023-11-28 MED ORDER — RIVAROXABAN 20 MG PO TABS: 20.0000 mg | ORAL_TABLET | Freq: Every morning | ORAL | 0 refills | Status: DC

## 2023-11-28 MED ORDER — SHINGRIX 50 MCG/0.5ML IM SUSR: 0.5000 mL | Freq: Once | INTRAMUSCULAR | 1 refills | Status: AC

## 2023-11-28 NOTE — Patient Instructions (Signed)

## 2023-11-28 NOTE — Progress Notes (Signed)
 Subjective:  Patient ID: [name], female    DOB: 10/02/1970  Age: 53 y.o. MRN: [id number]  CC: Annual Exam, Hypertension, Diabetes, and Hyperlipidemia   HPI [name] presents for a CPX and f/up ----  Discussed the use of AI scribe software for clinical note transcription with the patient, who gave verbal consent to proceed.  History of Present Illness   [name] is a 53 year old female who presents with leg cramps and low back pain.  She has been experiencing severe cramps in both legs for the past two to three weeks, primarily occurring during sleep. The pain originates from her back and radiates down to her toes, accompanied by a sensation of something inside her toes. She also experiences numbness and tingling in her legs.  She has a history of low back pain since a work-related injury in 2018. The pain can be severe enough to prevent her from standing at times. She has not been taking any prescribed medication for the pain, only using Tylenol  as needed.  A few months ago, she experienced dizziness for which she was prescribed meclizine , but she discontinued it due to excessive drowsiness. No recent chest pain, shortness of breath, or lightheadedness.       Outpatient Medications Prior to Visit  Medication Sig Dispense Refill   acetaminophen  (TYLENOL ) 500 MG tablet Take 500 mg by mouth every 6 (six) hours as needed for headache (pain).     fluticasone  (FLONASE ) 50 MCG/ACT nasal spray Place 1 spray into both nostrils daily. 16 g 2   Multiple Vitamins-Minerals (CENTRUM ADULTS) TABS Take by mouth.     meclizine  (ANTIVERT ) 25 MG tablet Take 1 tablet (25 mg total) by mouth 3 (three) times daily as needed for dizziness. 30 tablet 1   pseudoephedrine (SUDAFED) 60 MG tablet Take 60 mg by mouth every 6 (six) hours as needed for congestion. OTC only     rivaroxaban  (XARELTO ) 20 MG TABS tablet Take 1 tablet (20 mg total) by mouth every morning. 30 tablet 5   No  facility-administered medications prior to visit.    ROS Review of Systems  Constitutional:  Negative for appetite change, chills, diaphoresis, fatigue and fever.  HENT: Negative.    Respiratory: Negative.  Negative for cough, chest tightness, shortness of breath and wheezing.   Cardiovascular:  Negative for chest pain, palpitations and leg swelling.  Gastrointestinal:  Negative for abdominal pain, constipation, diarrhea, nausea and vomiting.  Genitourinary: Negative.  Negative for difficulty urinating.  Musculoskeletal:  Positive for back pain. Negative for arthralgias, joint swelling and myalgias.  Skin: Negative.   Neurological: Negative.  Negative for dizziness and weakness.  Hematological:  Negative for adenopathy. Does not bruise/bleed easily.  Psychiatric/Behavioral: Negative.      Objective:  BP 134/86 (BP Location: Left Arm, Patient Position: Sitting, Cuff Size: Normal)   Pulse 78   Temp 97.9 F (36.6 C) (Temporal)   Resp 16   Ht 5\' 4"  (1.626 m)   Wt 188 lb 9.6 oz (85.5 kg)   LMP 07/24/2018 (Exact Date)   SpO2 99%   BMI 32.37 kg/m   BP Readings from Last 3 Encounters:  11/28/23 134/86  07/07/23 128/84  04/22/23 128/78    Wt Readings from Last 3 Encounters:  11/28/23 188 lb 9.6 oz (85.5 kg)  07/07/23 184 lb 2 oz (83.5 kg)  04/22/23 185 lb (83.9 kg)    Physical Exam Vitals reviewed.  Constitutional:  Appearance: Normal appearance.  HENT:     Nose: Nose normal.     Mouth/Throat:     Mouth: Mucous membranes are moist.  Eyes:     General: No scleral icterus.    Conjunctiva/sclera: Conjunctivae normal.  Cardiovascular:     Rate and Rhythm: Normal rate and regular rhythm.     Pulses: Normal pulses.     Heart sounds: No murmur heard.    No friction rub. No gallop.  Pulmonary:     Effort: Pulmonary effort is normal.     Breath sounds: No stridor. No wheezing, rhonchi or rales.  Abdominal:     General: Abdomen is flat.     Palpations: There is no  mass.     Tenderness: There is no abdominal tenderness. There is no guarding.     Hernia: No hernia is present.  Musculoskeletal:        General: Normal range of motion.     Cervical back: Neck supple.     Right lower leg: No edema.     Left lower leg: No edema.  Lymphadenopathy:     Cervical: No cervical adenopathy.  Skin:    General: Skin is warm and dry.     Coloration: Skin is not pale.  Neurological:     General: No focal deficit present.     Mental Status: She is alert. Mental status is at baseline.  Psychiatric:        Mood and Affect: Mood normal.        Behavior: Behavior normal.     Lab Results  Component Value Date   WBC 4.0 07/07/2023   HGB 14.7 07/07/2023   HCT 44.4 07/07/2023   PLT 235.0 07/07/2023   GLUCOSE 98 11/28/2023   CHOL 209 (H) 11/28/2023   TRIG 116.0 11/28/2023   HDL 50.00 11/28/2023   LDLCALC 135 (H) 11/28/2023   ALT 20 07/07/2023   AST 27 07/07/2023   NA 136 11/28/2023   K 3.9 11/28/2023   CL 101 11/28/2023   CREATININE 0.93 11/28/2023   BUN 17 11/28/2023   CO2 28 11/28/2023   TSH 2.53 07/07/2023   INR 2.80 03/20/2012   HGBA1C 6.3 11/28/2023   MICROALBUR <0.7 11/28/2023    MM 3D SCREENING MAMMOGRAM BILATERAL BREAST Result Date: 11/08/2023 CLINICAL DATA:  Screening. EXAM: DIGITAL SCREENING BILATERAL MAMMOGRAM WITH TOMOSYNTHESIS AND CAD TECHNIQUE: Bilateral screening digital craniocaudal and mediolateral oblique mammograms were obtained. Bilateral screening digital breast tomosynthesis was performed. The images were evaluated with computer-aided detection. COMPARISON:  Previous exam(s). ACR Breast Density Category b: There are scattered areas of fibroglandular density. FINDINGS: There are no findings suspicious for malignancy. IMPRESSION: No mammographic evidence of malignancy. A result letter of this screening mammogram will be mailed directly to the patient. RECOMMENDATION: Screening mammogram in one year. (Code:SM-B-01Y) BI-RADS CATEGORY  1:  Negative. Electronically Signed   By: Alger Infield M.D.   On: 11/08/2023 11:55    Assessment & Plan:   Type II diabetes mellitus with manifestations (HCC)- Blood sugar is well controlled. -     Urinalysis, Routine w reflex microscopic; Future -     Hemoglobin A1c; Future -     Microalbumin / creatinine urine ratio; Future -     HM Diabetes Foot Exam -     Basic metabolic panel with GFR; Future  Primary hypertension- BP is well controlled. -     Urinalysis, Routine w reflex microscopic; Future -     Basic metabolic panel  with GFR; Future  Hyperlipidemia with target LDL less than 100- Will start a statin for CV risk reduction. -     Lipid panel; Future  CTEPH (chronic thromboembolic pulmonary hypertension) (HCC)- She has low Protein S activity. Will continue the DOAC. -     Antithrombin III -     Protein C activity -     Protein C, total -     Protein S activity -     Protein S, total -     Lupus anticoagulant panel -     Beta-2 -glycoprotein i abs, IgG/M/A -     Homocysteine -     Factor 5 leiden -     Prothrombin gene mutation -     Cardiolipin antibodies, IgG, IgM, IgA -     Rivaroxaban ; Take 1 tablet (20 mg total) by mouth every morning.  Dispense: 90 tablet; Refill: 0  Chronic viral hepatitis B without delta agent and without coma (HCC) -     Hepatitis B surface antigen; Future  Antiphospholipid syndrome (HCC) -     Antithrombin III -     Protein C activity -     Protein C, total -     Protein S activity -     Protein S, total -     Lupus anticoagulant panel -     Beta-2 -glycoprotein i abs, IgG/M/A -     Homocysteine -     Factor 5 leiden -     Prothrombin gene mutation -     Cardiolipin antibodies, IgG, IgM, IgA  Degeneration of intervertebral disc of lumbar region with discogenic back pain and lower extremity pain -     tiZANidine  HCl; Take 1 tablet (2 mg total) by mouth every 8 (eight) hours as needed for muscle spasms.  Dispense: 270 tablet; Refill:  0  Recurrent pulmonary embolism (HCC) -     Antithrombin III -     Protein C activity -     Protein C, total -     Protein S activity -     Protein S, total -     Lupus anticoagulant panel -     Beta-2 -glycoprotein i abs, IgG/M/A -     Homocysteine -     Factor 5 leiden -     Prothrombin gene mutation -     Cardiolipin antibodies, IgG, IgM, IgA -     Rivaroxaban ; Take 1 tablet (20 mg total) by mouth every morning.  Dispense: 90 tablet; Refill: 0  Need for prophylactic vaccination and inoculation against varicella -     Shingrix ; Inject 0.5 mLs into the muscle once for 1 dose.  Dispense: 0.5 mL; Refill: 1  Immunization due -     Tdap vaccine greater than or equal to 7yo IM  Encounter for general adult medical examination with abnormal findings- Exam completed, labs reviewed, vaccines reviewed and updated, cancer screenings are UTD, pt ed material was given.   Other orders -     dRVVT Mix -     dRVVT Confirm     Follow-up: Return in about 3 months (around 02/27/2024).  Sandra Crouch, MD

## 2023-11-30 LAB — BETA-2-GLYCOPROTEIN I ABS, IGG/M/A
Beta-2 Glyco 1 IgA: <9 GPI IgA units (ref 0–25)
Beta-2 Glyco 1 IgM: <9 GPI IgM units (ref 0–32)
Beta-2 Glyco I IgG: <9 GPI IgG units (ref 0–20)

## 2023-11-30 LAB — LUPUS ANTICOAGULANT PANEL
Dilute Viper Venom Time: 84.8 s — ABNORMAL HIGH (ref 0.0–47.0)
PTT Lupus Anticoagulant: 37.2 s (ref 0.0–43.5)

## 2023-11-30 LAB — DRVVT MIX: dRVVT Mix: 56.8 s — ABNORMAL HIGH (ref 0.0–40.4)

## 2023-11-30 LAB — DRVVT CONFIRM: dRVVT Confirm: 1.8 ratio — ABNORMAL HIGH (ref 0.8–1.2)

## 2023-12-06 LAB — PROTHROMBIN GENE MUTATION: PROTHROMBIN (FACTOR II): NEGATIVE

## 2023-12-06 LAB — PROTEIN S, TOTAL: PROTEIN S ANTIGEN, TOTAL: 80 %{normal} (ref 70–140)

## 2023-12-06 LAB — FACTOR 5 LEIDEN: Result: NEGATIVE

## 2023-12-06 LAB — CARDIOLIPIN ANTIBODIES, IGG, IGM, IGA
Anticardiolipin IgA: <2 [APL'U]/mL (ref <20.0)
Anticardiolipin IgG: <2 [GPL'U]/mL (ref <20.0)
Anticardiolipin IgM: <2 [MPL'U]/mL (ref <20.0)

## 2023-12-06 LAB — ANTITHROMBIN III: AntiThromb III Func: 94 %{normal} (ref 80–135)

## 2023-12-06 LAB — PROTEIN C ACTIVITY: Protein C Activity: 140 %{normal} (ref 70–180)

## 2023-12-06 LAB — PROTEIN S ACTIVITY: Protein S Activity: 39 %{normal} — ABNORMAL LOW (ref 60–140)

## 2023-12-06 LAB — HEPATITIS B SURFACE ANTIGEN: Hepatitis B Surface Ag: NONREACTIVE

## 2023-12-06 LAB — PROTEIN C, TOTAL: Protein C Antigen: 97 %{normal} (ref 70–140)

## 2023-12-06 LAB — HOMOCYSTEINE: Homocysteine: 9 umol/L (ref <10.4)

## 2023-12-07 ENCOUNTER — Encounter: Payer: Self-pay | Admitting: Internal Medicine

## 2023-12-07 DIAGNOSIS — Z23 Encounter for immunization: Secondary | ICD-10-CM | POA: Insufficient documentation

## 2023-12-07 MED ORDER — ROSUVASTATIN CALCIUM 10 MG PO TABS
10.0000 mg | ORAL_TABLET | Freq: Every day | ORAL | 1 refills | Status: DC
Start: 2023-12-07 — End: 2024-06-27

## 2023-12-09 ENCOUNTER — Other Ambulatory Visit (HOSPITAL_COMMUNITY)
Admission: RE | Admit: 2023-12-09 | Discharge: 2023-12-09 | Disposition: A | Discharge status: Home / Self Care | Source: Ambulatory Visit | Attending: Obstetrics and Gynecology | Admitting: Obstetrics and Gynecology

## 2023-12-09 ENCOUNTER — Ambulatory Visit (INDEPENDENT_AMBULATORY_CARE_PROVIDER_SITE_OTHER): Admitting: Obstetrics and Gynecology

## 2023-12-09 ENCOUNTER — Encounter: Payer: Self-pay | Admitting: Obstetrics and Gynecology

## 2023-12-09 VITALS — BP 104/72 | HR 77 | Temp 98.3°F | Ht 67.5 in | Wt 185.0 lb

## 2023-12-09 DIAGNOSIS — Z1331 Encounter for screening for depression: Secondary | ICD-10-CM

## 2023-12-09 DIAGNOSIS — Z124 Encounter for screening for malignant neoplasm of cervix: Secondary | ICD-10-CM | POA: Insufficient documentation

## 2023-12-09 DIAGNOSIS — Z8742 Personal history of other diseases of the female genital tract: Secondary | ICD-10-CM

## 2023-12-09 DIAGNOSIS — N958 Other specified menopausal and perimenopausal disorders: Secondary | ICD-10-CM

## 2023-12-09 DIAGNOSIS — Z01419 Encounter for gynecological examination (general) (routine) without abnormal findings: Secondary | ICD-10-CM | POA: Diagnosis not present

## 2023-12-09 NOTE — Progress Notes (Signed)
 53 y.o. [id number] postmenopausal female with history of recurrent PE/APLS (on Xarelto , history of cardiopulmonary surgery) here for annual exam. Widowed.  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  Patient's last menstrual period was 07/24/2018 (exact date).   Notes occasional vaginal dryness.  No longer in relationship.   Abnormal bleeding: no Pelvic discharge or pain: None Breast mass, nipple discharge or skin changes : No Last PAP:     Component Value Date/Time   DIAGPAP - Atrophic pattern with epithelial atypia (A) 06/03/2022 0910   DIAGPAP  05/29/2021 1142    - Negative for intraepithelial lesion or malignancy (NILM)   DIAGPAP  10/21/2017 0000    NEGATIVE FOR INTRAEPITHELIAL LESIONS OR MALIGNANCY. BENIGN REACTIVE/REPARATIVE CHANGES.   HPVHIGH Negative 06/03/2022 0910   HPVHIGH Negative 05/29/2021 1142   ADEQPAP  06/03/2022 0910    Satisfactory for evaluation; transformation zone component PRESENT.   ADEQPAP  05/29/2021 1142    Satisfactory for evaluation; transformation zone component PRESENT.   ADEQPAP  10/21/2017 0000    Satisfactory for evaluation  endocervical/transformation zone component PRESENT.   Last mammogram: 11/08/2023 BI-RADS 1, density B Last colonoscopy: Never, Cologuard 10/22/2022 negative Sexually active: No Exercising: No Smoker: No  Flowsheet Row Office Visit from 12/09/2023 in [facility]  PHQ-2 Total Score 0       Flowsheet Row Office Visit from 10/13/2022 in [facility] [place]  PHQ-9 Total Score 8       GYN HISTORY: No significant history  OB History  Gravida Para Term Preterm AB Living  4 3 3  0 1 3  SAB IAB Ectopic Multiple Live Births  1 0 0      # Outcome Date GA Lbr Len/2nd Weight Sex Type Anes PTL Lv  4 SAB           3 Term      Vag-Spont     2 Term      Vag-Spont     1 Term      Vag-Spont       Past Medical History:  Diagnosis Date   CTEPH (chronic thromboembolic pulmonary hypertension)  (HCC)    Diabetes mellitus without complication (HCC)    DVT (deep venous thrombosis) (HCC)    Elevated cholesterol    Hepatitis B infection    Hep B core Ab 04/2005   Primary hypercoagulable state (HCC)    Pulmonary embolism (HCC) 08/03/2003       Pulmonary embolism (HCC)    Seasonal allergies     Past Surgical History:  Procedure Laterality Date   CARDIAC CATHETERIZATION  11/2005   R heart cath : Severe pulmonary arterial HTN with evidence of cor pulmonale. Vasodilatory challenge not performed.   Pulmonary artery endarterectomy  2007   VENA CAVA FILTER PLACEMENT      Current Outpatient Medications on File Prior to Visit  Medication Sig Dispense Refill   acetaminophen  (TYLENOL ) 500 MG tablet Take 500 mg by mouth every 6 (six) hours as needed for headache (pain).     fluticasone  (FLONASE ) 50 MCG/ACT nasal spray Place 1 spray into both nostrils daily. 16 g 2   Multiple Vitamins-Minerals (CENTRUM ADULTS) TABS Take by mouth.     rivaroxaban  (XARELTO ) 20 MG TABS tablet Take 1 tablet (20 mg total) by mouth every morning. 90 tablet 0   rosuvastatin  (CRESTOR ) 10 MG tablet Take 1 tablet (10 mg total) by mouth daily. 90 tablet 1   tiZANidine  (ZANAFLEX ) 2 MG tablet Take  1 tablet (2 mg total) by mouth every 8 (eight) hours as needed for muscle spasms. 270 tablet 0   No current facility-administered medications on file prior to visit.    Social History   Socioeconomic History   Marital status: Widowed    Spouse name: Not on file   Number of children: 3   Years of education: Not on file   Highest education level: Not on file  Occupational History    Employer: UNEMPLOYED  Tobacco Use   Smoking status: Never    Passive exposure: Never   Smokeless tobacco: Never  Vaping Use   Vaping status: Never Used  Substance and Sexual Activity   Alcohol use: No    Alcohol/week: 0.0 standard drinks of alcohol   Drug use: No   Sexual activity: Not Currently    Partners: Male    Birth  control/protection: Abstinence  Other Topics Concern   Not on file  Social History Narrative   ** Merged History Encounter **       Pt lives in [place], braids hair for a living.    Social Drivers of Corporate investment banker Strain: Not on file  Food Insecurity: Not on file  Transportation Needs: Not on file  Physical Activity: Not on file  Stress: Not on file  Social Connections: Not on file  Intimate Partner Violence: Not on file    Family History  Problem Relation Age of Onset   Hypertension Mother    Hypertension Father        deceased   Breast cancer Neg Hx     Allergies  Allergen Reactions   Aspirin Other (See Comments)    Gi upset/ulcer      PE Today's Vitals   12/09/23 0757  BP: 104/72  Pulse: 77  Temp: 98.3 F (36.8 C)  TempSrc: Oral  SpO2: 99%  Weight: 185 lb (83.9 kg)  Height: 5' 7.5" (1.715 m)   Body mass index is 28.55 kg/m.  Physical Exam Vitals reviewed. Exam conducted with a chaperone present.  Constitutional:      General: She is not in acute distress.    Appearance: Normal appearance.  HENT:     Head: Normocephalic and atraumatic.     Nose: Nose normal.  Eyes:     Extraocular Movements: Extraocular movements intact.     Conjunctiva/sclera: Conjunctivae normal.  Neck:     Thyroid : No thyroid  mass, thyromegaly or thyroid  tenderness.  Pulmonary:     Effort: Pulmonary effort is normal.  Chest:     Chest wall: No mass or tenderness.  Breasts:    Right: Normal. No swelling, mass, nipple discharge, skin change or tenderness.     Left: Normal. No swelling, mass, nipple discharge, skin change or tenderness.  Abdominal:     General: There is no distension.     Palpations: Abdomen is soft.     Tenderness: There is no abdominal tenderness.  Genitourinary:    General: Normal vulva.     Exam position: Lithotomy position.     Urethra: No prolapse.     Vagina: Normal. No vaginal discharge or bleeding.     Cervix: Normal. No lesion.      Uterus: Normal. Not enlarged and not tender.      Adnexa: Right adnexa normal and left adnexa normal.  Musculoskeletal:        General: Normal range of motion.     Cervical back: Normal range of motion.  Lymphadenopathy:  Upper Body:     Right upper body: No axillary adenopathy.     Left upper body: No axillary adenopathy.     Lower Body: No right inguinal adenopathy. No left inguinal adenopathy.  Skin:    General: Skin is warm and dry.  Neurological:     General: No focal deficit present.     Mental Status: She is alert.  Psychiatric:        Mood and Affect: Mood normal.        Behavior: Behavior normal.       Assessment and Plan:        Well woman exam with routine gynecological exam Assessment & Plan: Cervical cancer screening performed according to ASCCP guidelines. Encouraged annual mammogram screening Colon cancer screening, cologuard UTD DXA N/A Labs and immunizations with her primary Encouraged safe sexual practices as indicated Encouraged healthy lifestyle practices with diet and exercise For patients under 50-70yo, I recommend 1200mg  calcium  daily and 600IU of vitamin D daily.    Cervical cancer screening -     Cytology - PAP  History of abnormal cervical Pap smear -     Cytology - PAP  Negative depression screening  Genitourinary syndrome of menopause  Considering applying aquaphor or coconut oil to the vulva after showers. You could also using daily vaginal moisturizers by brands like Good Clean Love and Ah! Yes.   Romaine Closs, MD

## 2023-12-09 NOTE — Assessment & Plan Note (Signed)
 Cervical cancer screening performed according to ASCCP guidelines. Encouraged annual mammogram screening Colon cancer screening, cologuard UTD DXA N/A Labs and immunizations with her primary Encouraged safe sexual practices as indicated Encouraged healthy lifestyle practices with diet and exercise For patients under 50-55yo, I recommend 1200mg  calcium  daily and 600IU of vitamin D daily.

## 2023-12-09 NOTE — Patient Instructions (Addendum)
 Considering applying aquaphor or coconut oil to the vulva after showers. You could also using daily vaginal moisturizers by brands like Good Clean Love and Ah! Yes.  For patients under 50-55yo, I recommend 1200mg  calcium daily and 600IU of vitamin D daily. For patients over 55yo, I recommend 1200mg  calcium daily and 800IU of vitamin D daily.  Health Maintenance, Female Adopting a healthy lifestyle and getting preventive care are important in promoting health and wellness. Ask your health care provider about: The right schedule for you to have regular tests and exams. Things you can do on your own to prevent diseases and keep yourself healthy. What should I know about diet, weight, and exercise? Eat a healthy diet  Eat a diet that includes plenty of vegetables, fruits, low-fat dairy products, and lean protein. Do not eat a lot of foods that are high in solid fats, added sugars, or sodium. Maintain a healthy weight Body mass index (BMI) is used to identify weight problems. It estimates body fat based on height and weight. Your health care provider can help determine your BMI and help you achieve or maintain a healthy weight. Get regular exercise Get regular exercise. This is one of the most important things you can do for your health. Most adults should: Exercise for at least 150 minutes each week. The exercise should increase your heart rate and make you sweat (moderate-intensity exercise). Do strengthening exercises at least twice a week. This is in addition to the moderate-intensity exercise. Spend less time sitting. Even light physical activity can be beneficial. Watch cholesterol and blood lipids Have your blood tested for lipids and cholesterol at 55 years of age, then have this test every 5 years. Have your cholesterol levels checked more often if: Your lipid or cholesterol levels are high. You are older than 56 years of age. You are at high risk for heart disease. What should I know  about cancer screening? Depending on your health history and family history, you may need to have cancer screening at various ages. This may include screening for: Breast cancer. Cervical cancer. Colorectal cancer. Skin cancer. Lung cancer. What should I know about heart disease, diabetes, and high blood pressure? Blood pressure and heart disease High blood pressure causes heart disease and increases the risk of stroke. This is more likely to develop in people who have high blood pressure readings or are overweight. Have your blood pressure checked: Every 3-5 years if you are 29-15 years of age. Every year if you are 55 years old or older. Diabetes Have regular diabetes screenings. This checks your fasting blood sugar level. Have the screening done: Once every three years after age 39 if you are at a normal weight and have a low risk for diabetes. More often and at a younger age if you are overweight or have a high risk for diabetes. What should I know about preventing infection? Hepatitis B If you have a higher risk for hepatitis B, you should be screened for this virus. Talk with your health care provider to find out if you are at risk for hepatitis B infection. Hepatitis C Testing is recommended for: Everyone born from [birth year] through 1967. Anyone with known risk factors for hepatitis C. Sexually transmitted infections (STIs) Get screened for STIs, including gonorrhea and chlamydia, if: You are sexually active and are younger than 55 years of age. You are older than 55 years of age and your health care provider tells you that you are at risk for this type  of infection. Your sexual activity has changed since you were last screened, and you are at increased risk for chlamydia or gonorrhea. Ask your health care provider if you are at risk. Ask your health care provider about whether you are at high risk for HIV. Your health care provider may recommend a prescription medicine to help prevent  HIV infection. If you choose to take medicine to prevent HIV, you should first get tested for HIV. You should then be tested every 3 months for as long as you are taking the medicine. Osteoporosis and menopause Osteoporosis is a disease in which the bones lose minerals and strength with aging. This can result in bone fractures. If you are 13 years old or older, or if you are at risk for osteoporosis and fractures, ask your health care provider if you should: Be screened for bone loss. Take a calcium or vitamin D supplement to lower your risk of fractures. Be given hormone replacement therapy (HRT) to treat symptoms of menopause. Follow these instructions at home: Alcohol use Do not drink alcohol if: Your health care provider tells you not to drink. You are pregnant, may be pregnant, or are planning to become pregnant. If you drink alcohol: Limit how much you have to: 0-1 drink a day. Know how much alcohol is in your drink. In the [place] one drink equals one 12 oz bottle of beer (355 mL), one 5 oz glass of wine (148 mL), or one 1 oz glass of hard liquor (44 mL). Lifestyle Do not use any products that contain nicotine or tobacco. These products include cigarettes, chewing tobacco, and vaping devices, such as e-cigarettes. If you need help quitting, ask your health care provider. Do not use street drugs. Do not share needles. Ask your health care provider for help if you need support or information about quitting drugs. General instructions Schedule regular health, dental, and eye exams. Stay current with your vaccines. Tell your health care provider if: You often feel depressed. You have ever been abused or do not feel safe at home. Summary Adopting a healthy lifestyle and getting preventive care are important in promoting health and wellness. Follow your health care provider's instructions about healthy diet, exercising, and getting tested or screened for diseases. Follow your health care  provider's instructions on monitoring your cholesterol and blood pressure. This information is not intended to replace advice given to you by your health care provider. Make sure you discuss any questions you have with your health care provider. Document Revised: 12/08/2020 Document Reviewed: 12/08/2020 Elsevier Patient Education  2024 [employer].

## 2023-12-13 ENCOUNTER — Ambulatory Visit: Payer: Self-pay | Admitting: Obstetrics and Gynecology

## 2023-12-13 LAB — CYTOLOGY - PAP
Comment: NEGATIVE
Diagnosis: NEGATIVE
High risk HPV: NEGATIVE

## 2023-12-28 ENCOUNTER — Emergency Department (HOSPITAL_COMMUNITY)

## 2023-12-28 ENCOUNTER — Encounter (HOSPITAL_COMMUNITY): Payer: Self-pay

## 2023-12-28 ENCOUNTER — Emergency Department (HOSPITAL_COMMUNITY)
Admission: EM | Admit: 2023-12-28 | Discharge: 2023-12-28 | Disposition: A | Discharge status: Home / Self Care | Attending: Student | Admitting: Student

## 2023-12-28 ENCOUNTER — Other Ambulatory Visit: Payer: Self-pay

## 2023-12-28 DIAGNOSIS — J4520 Mild intermittent asthma, uncomplicated: Secondary | ICD-10-CM | POA: Insufficient documentation

## 2023-12-28 DIAGNOSIS — S0990XA Unspecified injury of head, initial encounter: Secondary | ICD-10-CM | POA: Diagnosis present

## 2023-12-28 DIAGNOSIS — F410 Panic disorder [episodic paroxysmal anxiety] without agoraphobia: Secondary | ICD-10-CM | POA: Diagnosis not present

## 2023-12-28 DIAGNOSIS — Z7951 Long term (current) use of inhaled steroids: Secondary | ICD-10-CM | POA: Diagnosis not present

## 2023-12-28 DIAGNOSIS — Z7901 Long term (current) use of anticoagulants: Secondary | ICD-10-CM | POA: Diagnosis not present

## 2023-12-28 DIAGNOSIS — W19XXXA Unspecified fall, initial encounter: Secondary | ICD-10-CM

## 2023-12-28 DIAGNOSIS — W1809XA Striking against other object with subsequent fall, initial encounter: Secondary | ICD-10-CM | POA: Diagnosis not present

## 2023-12-28 DIAGNOSIS — I10 Essential (primary) hypertension: Secondary | ICD-10-CM | POA: Diagnosis not present

## 2023-12-28 DIAGNOSIS — E119 Type 2 diabetes mellitus without complications: Secondary | ICD-10-CM | POA: Insufficient documentation

## 2023-12-28 LAB — CBC WITH DIFFERENTIAL/PLATELET
Abs Immature Granulocytes: 0.01 10*3/uL (ref 0.00–0.07)
Basophils Absolute: 0.1 10*3/uL (ref 0.0–0.1)
Basophils Relative: 1 %
Eosinophils Absolute: 0.1 10*3/uL (ref 0.0–0.5)
Eosinophils Relative: 2 %
HCT: 43.8 % (ref 36.0–46.0)
Hemoglobin: 15.1 g/dL — ABNORMAL HIGH (ref 12.0–15.0)
Immature Granulocytes: 0 %
Lymphocytes Relative: 50 %
Lymphs Abs: 3.1 10*3/uL (ref 0.7–4.0)
MCH: 29.1 pg (ref 26.0–34.0)
MCHC: 34.5 g/dL (ref 30.0–36.0)
MCV: 84.4 fL (ref 80.0–100.0)
Monocytes Absolute: 0.9 10*3/uL (ref 0.1–1.0)
Monocytes Relative: 14 %
Neutro Abs: 2 10*3/uL (ref 1.7–7.7)
Neutrophils Relative %: 33 %
Platelets: 248 10*3/uL (ref 150–400)
RBC: 5.19 MIL/uL — ABNORMAL HIGH (ref 3.87–5.11)
RDW: 13.1 % (ref 11.5–15.5)
WBC: 6.1 10*3/uL (ref 4.0–10.5)
nRBC: 0 % (ref 0.0–0.2)

## 2023-12-28 LAB — COMPREHENSIVE METABOLIC PANEL WITH GFR
ALT: 22 U/L (ref 0–44)
AST: 29 U/L (ref 15–41)
Albumin: 4.1 g/dL (ref 3.5–5.0)
Alkaline Phosphatase: 70 U/L (ref 38–126)
Anion gap: 10 (ref 5–15)
BUN: 15 mg/dL (ref 6–20)
CO2: 24 mmol/L (ref 22–32)
Calcium: 9.8 mg/dL (ref 8.9–10.3)
Chloride: 103 mmol/L (ref 98–111)
Creatinine, Ser: 0.89 mg/dL (ref 0.44–1.00)
GFR, Estimated: >60 mL/min (ref >60)
Glucose, Bld: 110 mg/dL — ABNORMAL HIGH (ref 70–99)
Potassium: 3.9 mmol/L (ref 3.5–5.1)
Sodium: 137 mmol/L (ref 135–145)
Total Bilirubin: 0.7 mg/dL (ref 0.0–1.2)
Total Protein: 7.9 g/dL (ref 6.5–8.1)

## 2023-12-28 LAB — TROPONIN I (HIGH SENSITIVITY): Troponin I (High Sensitivity): 5 ng/L (ref <18)

## 2023-12-28 MED ORDER — LORAZEPAM 2 MG/ML IJ SOLN
1.0000 mg | Freq: Once | INTRAMUSCULAR | Status: AC
  Administered 2023-12-28: 1 mg via INTRAVENOUS
  Filled 2023-12-28: qty 1

## 2023-12-28 NOTE — Progress Notes (Signed)
 Orthopedic Tech Progress Note Patient Details:  [name] 09-22-1970 [id number]  Patient ID: [name], female   DOB: August 12, 1970, 53 y.o.   MRN: [id number] Level II; not needed. [place] A Florinda Hush 12/28/2023, 1:33 AM

## 2023-12-28 NOTE — ED Provider Notes (Signed)
 [place] [facility]MENT AT [facility] Provider Note  CSN: [id number] Arrival date & time: 12/28/23 0107  Chief Complaint(s) Panic Attack and level 2 trauma  HPI [name] is a 53 y.o. female with PMH chronic thromboembolic pulmonary hypertension, T2DM, DVT on Xarelto , panic disorder who presents emergency room for evaluation of a fall and a panic attack.  History attained from patient's family who states that earlier this evening she was in a heated argument and suffered a syncopal event striking her head on the ground.  Family states that this is very common for patient's panic attack episodes.  EMS was then called to the house and patient refused EMS transport at that time.  She was in an additional argument with her family later, lay down in the bed and had persistent panic symptoms prompting an additional call to EMS and transfer the Emergency Department.  Here in the emergency room, patient is thrashing in the bed but intermittently answering questions and complaining of a headache.  She is redirectable but history difficult to obtain in the setting of this behavioral disturbance   Past Medical History Past Medical History:  Diagnosis Date   CTEPH (chronic thromboembolic pulmonary hypertension) (HCC)    Diabetes mellitus without complication (HCC)    DVT (deep venous thrombosis) (HCC)    Elevated cholesterol    Hepatitis B infection    Hep B core Ab 04/2005   Primary hypercoagulable state (HCC)    Pulmonary embolism (HCC) 08/03/2003       Pulmonary embolism (HCC)    Seasonal allergies    Patient Active Problem List   Diagnosis Date Noted   Well woman exam with routine gynecological exam 12/09/2023   Immunization due 12/07/2023   Need for prophylactic vaccination and inoculation against varicella 11/28/2023   OSA (obstructive sleep apnea) 03/24/2022   Recurrent pulmonary embolism (HCC) 03/24/2022   DDD (degenerative disc disease), lumbar 08/04/2021    Primary hypertension 02/27/2021   Hyperlipidemia with target LDL less than 100 02/26/2021   Type II diabetes mellitus with manifestations (HCC) 02/26/2021   Encounter for general adult medical examination with abnormal findings 02/24/2021   Screening for cervical cancer 02/24/2021   Mild intermittent asthma without complication 08/17/2018   Iron deficiency anemia 12/18/2015   Allergic rhinitis 03/11/2012   Antiphospholipid syndrome (HCC) 05/24/2011   Hepatitis B infection    CTEPH (chronic thromboembolic pulmonary hypertension) (HCC) 10/05/2010   Long term current use of anticoagulant 10/05/2010   Home Medication(s) Prior to Admission medications   Medication Sig Start Date End Date Taking? Authorizing Provider  acetaminophen  (TYLENOL ) 500 MG tablet Take 500 mg by mouth every 6 (six) hours as needed for headache (pain).    [provider]  fluticasone  (FLONASE ) 50 MCG/ACT nasal spray Place 1 spray into both nostrils daily. 12/11/21   Sood, Vineet, MD  Multiple Vitamins-Minerals (CENTRUM ADULTS) TABS Take by mouth.    [provider]  rivaroxaban  (XARELTO ) 20 MG TABS tablet Take 1 tablet (20 mg total) by mouth every morning. 11/28/23   Arcadio Knuckles, MD  rosuvastatin  (CRESTOR ) 10 MG tablet Take 1 tablet (10 mg total) by mouth daily. 12/07/23   Arcadio Knuckles, MD  tiZANidine  (ZANAFLEX ) 2 MG tablet Take 1 tablet (2 mg total) by mouth every 8 (eight) hours as needed for muscle spasms. 11/28/23   Arcadio Knuckles, MD  Past Surgical History Past Surgical History:  Procedure Laterality Date   CARDIAC CATHETERIZATION  11/2005   R heart cath : Severe pulmonary arterial HTN with evidence of cor pulmonale. Vasodilatory challenge not performed.   Pulmonary artery endarterectomy  2007   VENA CAVA FILTER PLACEMENT     Family History Family History  Problem  Relation Age of Onset   Hypertension Mother    Hypertension Father        deceased   Breast cancer Neg Hx     Social History Social History   Tobacco Use   Smoking status: Never    Passive exposure: Never   Smokeless tobacco: Never  Vaping Use   Vaping status: Never Used  Substance Use Topics   Alcohol use: No    Alcohol/week: 0.0 standard drinks of alcohol   Drug use: No   Allergies Aspirin  Review of Systems Review of Systems  Unable to perform ROS: Psychiatric disorder    Physical Exam Vital Signs  I have reviewed the triage vital signs BP (!) 150/90 Comment: MANUAL  Pulse 88   Temp (!) 97.3 F (36.3 C)   Resp 18   Ht 5\' 6"  (1.676 m)   Wt 84 kg   LMP 07/24/2018 (Exact Date)   SpO2 98%   BMI 29.89 kg/m   Physical Exam Vitals and nursing note reviewed.  Constitutional:      General: She is not in acute distress.    Appearance: She is well-developed.  HENT:     Head: Normocephalic and atraumatic.  Eyes:     Conjunctiva/sclera: Conjunctivae normal.  Cardiovascular:     Rate and Rhythm: Normal rate and regular rhythm.     Heart sounds: No murmur heard. Pulmonary:     Effort: Pulmonary effort is normal. No respiratory distress.     Breath sounds: Normal breath sounds.  Abdominal:     Palpations: Abdomen is soft.     Tenderness: There is no abdominal tenderness.  Musculoskeletal:        General: No swelling.     Cervical back: Neck supple.  Skin:    General: Skin is warm and dry.     Capillary Refill: Capillary refill takes less than 2 seconds.  Neurological:     Mental Status: She is alert.  Psychiatric:        Mood and Affect: Mood normal.     Comments: Agitated     ED Results and Treatments Labs (all labs ordered are listed, but only abnormal results are displayed) Labs Reviewed  COMPREHENSIVE METABOLIC PANEL WITH GFR - Abnormal; Notable for the following components:      Result Value   Glucose, Bld 110 (*)    All other components  within normal limits  CBC WITH DIFFERENTIAL/PLATELET - Abnormal; Notable for the following components:   RBC 5.19 (*)    Hemoglobin 15.1 (*)    All other components within normal limits  TROPONIN I (HIGH SENSITIVITY)  Radiology CT Head Wo Contrast Result Date: 12/28/2023 CLINICAL DATA:  Head trauma, abnormal mental status (Age 36-64y) Panic Attack; level 2 trauma Fall face first EXAM: CT HEAD WITHOUT CONTRAST TECHNIQUE: Contiguous axial images were obtained from the base of the skull through the vertex without intravenous contrast. RADIATION DOSE REDUCTION: This exam was performed according to the departmental dose-optimization program which includes automated exposure control, adjustment of the mA and/or kV according to patient size and/or use of iterative reconstruction technique. COMPARISON:  CT head 01/22/2022 FINDINGS: Brain: No evidence of large-territorial acute infarction. No parenchymal hemorrhage. No mass lesion. No extra-axial collection. No mass effect or midline shift. No hydrocephalus. Basilar cisterns are patent. Vascular: No hyperdense vessel. Skull: No acute fracture or focal lesion. Sinuses/Orbits: Paranasal sinuses and mastoid air cells are clear. The orbits are unremarkable. Other: None. IMPRESSION: No acute intracranial abnormality. Electronically Signed   By: Morgane  Naveau M.D.   On: 12/28/2023 01:42    Pertinent labs & imaging results that were available during my care of the patient were reviewed by me and considered in my medical decision making (see MDM for details).  Medications Ordered in ED Medications  LORazepam  (ATIVAN ) injection 1 mg (1 mg Intravenous Given 12/28/23 0119)                                                                                                                                     Procedures .Critical Care  Performed by:  Karlyn Overman, MD Authorized by: Karlyn Overman, MD   Critical care provider statement:    Critical care time (minutes):  30   Critical care was necessary to treat or prevent imminent or life-threatening deterioration of the following conditions:  Trauma   Critical care was time spent personally by me on the following activities:  Development of treatment plan with patient or surrogate, discussions with consultants, evaluation of patient's response to treatment, examination of patient, ordering and review of laboratory studies, ordering and review of radiographic studies, ordering and performing treatments and interventions, pulse oximetry, re-evaluation of patient's condition and review of old charts   (including critical care time)  Medical Decision Making / ED Course   This patient presents to the ED for concern of panic attack, syncope, head strike, this involves an extensive number of treatment options, and is a complaint that carries with it a high risk of complications and morbidity.  The differential diagnosis includes orthostatic syncope, cardiogenic syncope, vasovagal syncope, electrolyte abnormality, dehydration, dysrhythmia, vasovagal, Hypoglycemia, Seizure, Autonomic Insufficiency  MDM: Patient seen emergency room for evaluation of a panic attack and head strike on blood thinners.  Patient arrives a level 2 trauma for head strike on Xarelto  and on primary survey, patient with appears to be some functional neurologic behavior, staring off into space, but will break and answer questions appropriately then continue the behavior.  Secondary survey with no visible signs of trauma or tenderness to  palpation of the chest abdomen or pelvis.  Laboratory evaluations unremarkable.  High-sensitivity troponin normal.  ECG without evidence of WPW or Brugada.  Trauma imaging including CT head is negative for acute traumatic injury.  Negative by Nexus criteria and thus CT C-spine imaging deferred.   Additional history obtained from patient's daughters who states that this is her typical behavior when she is suffering from a panic attack and she was given Ativan  for symptom control.  On reevaluation her symptoms have improved and she states she is feeling improved.  She does have a history of previous PE but has been compliant with her anticoagulation and is currently not experiencing any chest pain or shortness of breath.  She is otherwise low risk by Wells criteria and I have very low suspicion for PE as a source of her syncopal episode today especially as this episode was preceded by a stressful argument and she has previous documented history of similar behavior.  PE study deferred.  At this time she does not meet inpatient criteria for admission will be discharged with outpatient follow-up.   Additional history obtained: -Additional history obtained from multiple family members -External records from outside source obtained and reviewed including: Chart review including previous notes, labs, imaging, consultation notes   Lab Tests: -I ordered, reviewed, and interpreted labs.   The pertinent results include:   Labs Reviewed  COMPREHENSIVE METABOLIC PANEL WITH GFR - Abnormal; Notable for the following components:      Result Value   Glucose, Bld 110 (*)    All other components within normal limits  CBC WITH DIFFERENTIAL/PLATELET - Abnormal; Notable for the following components:   RBC 5.19 (*)    Hemoglobin 15.1 (*)    All other components within normal limits  TROPONIN I (HIGH SENSITIVITY)      EKG  Normal sinus rhythm    Imaging Studies ordered: I ordered imaging studies including CT head I independently visualized and interpreted imaging. I agree with the radiologist interpretation   Medicines ordered and prescription drug management: Meds ordered this encounter  Medications   LORazepam  (ATIVAN ) injection 1 mg    -I have reviewed the patients home medicines and have  made adjustments as needed  Critical interventions Trauma activation and evaluation   Cardiac Monitoring: The patient was maintained on a cardiac monitor.  I personally viewed and interpreted the cardiac monitored which showed an underlying rhythm of: NSR  Social Determinants of Health:  Factors impacting patients care include: none   Reevaluation: After the interventions noted above, I reevaluated the patient and found that they have :improved  Co morbidities that complicate the patient evaluation  Past Medical History:  Diagnosis Date   CTEPH (chronic thromboembolic pulmonary hypertension) (HCC)    Diabetes mellitus without complication (HCC)    DVT (deep venous thrombosis) (HCC)    Elevated cholesterol    Hepatitis B infection    Hep B core Ab 04/2005   Primary hypercoagulable state (HCC)    Pulmonary embolism (HCC) 08/03/2003       Pulmonary embolism (HCC)    Seasonal allergies       Dispostion: I considered admission for this patient, but at this time she does not meet inpatient criteria for admission and will be discharged with outpatient follow-up.     Final Clinical Impression(s) / ED Diagnoses Final diagnoses:  Panic attack  Fall, initial encounter     @PCDICTATION @    Earnest Thalman, [place], MD 12/28/23 [phone]

## 2023-12-28 NOTE — ED Triage Notes (Addendum)
 PT brought in by EMS. PT had a panic attack and she had a fall ground level. PT fell face first no complaints of neck pain or back pain. PT VSS and no distress noted. PT very anxious but no pain. Trauma level 2 activated due to fall and PT on blood thinners.

## 2023-12-30 ENCOUNTER — Telehealth: Payer: Self-pay

## 2023-12-30 NOTE — Telephone Encounter (Signed)
 Error

## 2024-01-06 ENCOUNTER — Encounter: Payer: Self-pay | Admitting: Internal Medicine

## 2024-01-06 ENCOUNTER — Ambulatory Visit: Admitting: Internal Medicine

## 2024-01-06 VITALS — BP 144/86 | HR 73 | Temp 98.1°F | Resp 16 | Ht 66.0 in | Wt 185.5 lb

## 2024-01-06 DIAGNOSIS — M79661 Pain in right lower leg: Secondary | ICD-10-CM

## 2024-01-06 DIAGNOSIS — E118 Type 2 diabetes mellitus with unspecified complications: Secondary | ICD-10-CM

## 2024-01-06 DIAGNOSIS — I2724 Chronic thromboembolic pulmonary hypertension: Secondary | ICD-10-CM

## 2024-01-06 DIAGNOSIS — R079 Chest pain, unspecified: Secondary | ICD-10-CM

## 2024-01-06 DIAGNOSIS — I10 Essential (primary) hypertension: Secondary | ICD-10-CM

## 2024-01-06 DIAGNOSIS — E785 Hyperlipidemia, unspecified: Secondary | ICD-10-CM

## 2024-01-06 DIAGNOSIS — M7989 Other specified soft tissue disorders: Secondary | ICD-10-CM | POA: Insufficient documentation

## 2024-01-06 DIAGNOSIS — I2699 Other pulmonary embolism without acute cor pulmonale: Secondary | ICD-10-CM

## 2024-01-06 NOTE — Patient Instructions (Signed)
 Hypertension, Adult High blood pressure (hypertension) is when the force of blood pumping through the arteries is too strong. The arteries are the blood vessels that carry blood from the heart throughout the body. Hypertension forces the heart to work harder to pump blood and may cause arteries to become narrow or stiff. Untreated or uncontrolled hypertension can lead to a heart attack, heart failure, a stroke, kidney disease, and other problems. A blood pressure reading consists of a higher number over a lower number. Ideally, your blood pressure should be below 120/80. The first ("top") number is called the systolic pressure. It is a measure of the pressure in your arteries as your heart beats. The second ("bottom") number is called the diastolic pressure. It is a measure of the pressure in your arteries as the heart relaxes. What are the causes? The exact cause of this condition is not known. There are some conditions that result in high blood pressure. What increases the risk? Certain factors may make you more likely to develop high blood pressure. Some of these risk factors are under your control, including: Smoking. Not getting enough exercise or physical activity. Being overweight. Having too much fat, sugar, calories, or salt (sodium) in your diet. Drinking too much alcohol. Other risk factors include: Having a personal history of heart disease, diabetes, high cholesterol, or kidney disease. Stress. Having a family history of high blood pressure and high cholesterol. Having obstructive sleep apnea. Age. The risk increases with age. What are the signs or symptoms? High blood pressure may not cause symptoms. Very high blood pressure (hypertensive crisis) may cause: Headache. Fast or irregular heartbeats (palpitations). Shortness of breath. Nosebleed. Nausea and vomiting. Vision changes. Severe chest pain, dizziness, and seizures. How is this diagnosed? This condition is diagnosed by  measuring your blood pressure while you are seated, with your arm resting on a flat surface, your legs uncrossed, and your feet flat on the floor. The cuff of the blood pressure monitor will be placed directly against the skin of your upper arm at the level of your heart. Blood pressure should be measured at least twice using the same arm. Certain conditions can cause a difference in blood pressure between your right and left arms. If you have a high blood pressure reading during one visit or you have normal blood pressure with other risk factors, you may be asked to: Return on a different day to have your blood pressure checked again. Monitor your blood pressure at home for 1 week or longer. If you are diagnosed with hypertension, you may have other blood or imaging tests to help your health care provider understand your overall risk for other conditions. How is this treated? This condition is treated by making healthy lifestyle changes, such as eating healthy foods, exercising more, and reducing your alcohol intake. You may be referred for counseling on a healthy diet and physical activity. Your health care provider may prescribe medicine if lifestyle changes are not enough to get your blood pressure under control and if: Your systolic blood pressure is above 130. Your diastolic blood pressure is above 80. Your personal target blood pressure may vary depending on your medical conditions, your age, and other factors. Follow these instructions at home: Eating and drinking  Eat a diet that is high in fiber and potassium, and low in sodium, added sugar, and fat. An example of this eating plan is called the DASH diet. DASH stands for Dietary Approaches to Stop Hypertension. To eat this way: Eat  plenty of fresh fruits and vegetables. Try to fill one half of your plate at each meal with fruits and vegetables. Eat whole grains, such as whole-wheat pasta, brown rice, or whole-grain bread. Fill about one  fourth of your plate with whole grains. Eat or drink low-fat dairy products, such as skim milk or low-fat yogurt. Avoid fatty cuts of meat, processed or cured meats, and poultry with skin. Fill about one fourth of your plate with lean proteins, such as fish, chicken without skin, beans, eggs, or tofu. Avoid pre-made and processed foods. These tend to be higher in sodium, added sugar, and fat. Reduce your daily sodium intake. Many people with hypertension should eat less than 1,500 mg of sodium a day. Do not drink alcohol if: Your health care provider tells you not to drink. You are pregnant, may be pregnant, or are planning to become pregnant. If you drink alcohol: Limit how much you have to: 0-1 drink a day for women. 0-2 drinks a day for men. Know how much alcohol is in your drink. In the [place] one drink equals one 12 oz bottle of beer (355 mL), one 5 oz glass of wine (148 mL), or one 1 oz glass of hard liquor (44 mL). Lifestyle  Work with your health care provider to maintain a healthy body weight or to lose weight. Ask what an ideal weight is for you. Get at least 30 minutes of exercise that causes your heart to beat faster (aerobic exercise) most days of the week. Activities may include walking, swimming, or biking. Include exercise to strengthen your muscles (resistance exercise), such as Pilates or lifting weights, as part of your weekly exercise routine. Try to do these types of exercises for 30 minutes at least 3 days a week. Do not use any products that contain nicotine or tobacco. These products include cigarettes, chewing tobacco, and vaping devices, such as e-cigarettes. If you need help quitting, ask your health care provider. Monitor your blood pressure at home as told by your health care provider. Keep all follow-up visits. This is important. Medicines Take over-the-counter and prescription medicines only as told by your health care provider. Follow directions carefully. Blood  pressure medicines must be taken as prescribed. Do not skip doses of blood pressure medicine. Doing this puts you at risk for problems and can make the medicine less effective. Ask your health care provider about side effects or reactions to medicines that you should watch for. Contact a health care provider if you: Think you are having a reaction to a medicine you are taking. Have headaches that keep coming back (recurring). Feel dizzy. Have swelling in your ankles. Have trouble with your vision. Get help right away if you: Develop a severe headache or confusion. Have unusual weakness or numbness. Feel faint. Have severe pain in your chest or abdomen. Vomit repeatedly. Have trouble breathing. These symptoms may be an emergency. Get help right away. Call 911. Do not wait to see if the symptoms will go away. Do not drive yourself to the hospital. Summary Hypertension is when the force of blood pumping through your arteries is too strong. If this condition is not controlled, it may put you at risk for serious complications. Your personal target blood pressure may vary depending on your medical conditions, your age, and other factors. For most people, a normal blood pressure is less than 120/80. Hypertension is treated with lifestyle changes, medicines, or a combination of both. Lifestyle changes include losing weight, eating a healthy,  low-sodium diet, exercising more, and limiting alcohol. This information is not intended to replace advice given to you by your health care provider. Make sure you discuss any questions you have with your health care provider. Document Revised: 05/26/2021 Document Reviewed: 05/26/2021 Elsevier Patient Education  2024 [employer].

## 2024-01-06 NOTE — Progress Notes (Addendum)
 Subjective:  Patient ID: [name], female    DOB: 02-15-1971  Age: 53 y.o. MRN: [id number]  CC: Hypertension   HPI [name] presents for f/up -----  Discussed the use of AI scribe software for clinical note transcription with the patient, who gave verbal consent to proceed.  History of Present Illness   [name] is a 53 year old female who presents with syncope and leg pain.  She experienced a syncopal episode recently triggered by emotional stress when her children were fighting. Following this event, she was taken to the emergency room. Since then, she feels weak, unable to perform activities, and is tired all the time. She also experiences dizziness and had an episode of shortness of breath at work yesterday. No chest pain is reported.  She reports pain and swelling in her right leg, concerned about a possible clot. The leg has been swollen for some time, but the swelling has increased recently. She describes the pain as cramp-like and notes that the leg sometimes feels warm. She mentions a 'pimple inside' that broke and released fluid. She is currently taking a blood thinner daily.       Outpatient Medications Prior to Visit  Medication Sig Dispense Refill   acetaminophen  (TYLENOL ) 500 MG tablet Take 500 mg by mouth every 6 (six) hours as needed for headache (pain).     fluticasone  (FLONASE ) 50 MCG/ACT nasal spray Place 1 spray into both nostrils daily. 16 g 2   Multiple Vitamins-Minerals (CENTRUM ADULTS) TABS Take by mouth.     rosuvastatin  (CRESTOR ) 10 MG tablet Take 1 tablet (10 mg total) by mouth daily. 90 tablet 1   tiZANidine  (ZANAFLEX ) 2 MG tablet Take 1 tablet (2 mg total) by mouth every 8 (eight) hours as needed for muscle spasms. 270 tablet 0   rivaroxaban  (XARELTO ) 20 MG TABS tablet Take 1 tablet (20 mg total) by mouth every morning. 90 tablet 0   No facility-administered medications prior to visit.    ROS Review of Systems   Constitutional:  Negative for appetite change, chills, diaphoresis and fatigue.  HENT: Negative.    Eyes: Negative.   Respiratory:  Negative for cough, chest tightness, shortness of breath and wheezing.   Cardiovascular:  Positive for chest pain and leg swelling. Negative for palpitations.  Gastrointestinal: Negative.  Negative for abdominal pain, constipation, diarrhea, nausea, rectal pain and vomiting.  Endocrine: Negative.   Genitourinary: Negative.  Negative for difficulty urinating.  Musculoskeletal: Negative.  Negative for arthralgias, back pain and myalgias.  Skin: Negative.   Neurological: Negative.  Negative for dizziness and weakness.  Hematological:  Negative for adenopathy. Does not bruise/bleed easily.  Psychiatric/Behavioral:  Positive for dysphoric mood. Negative for confusion, decreased concentration, sleep disturbance and suicidal ideas. The patient is nervous/anxious.     Objective:  BP (!) 144/86 (BP Location: Right Arm, Patient Position: Sitting, Cuff Size: Normal)   Pulse 73   Temp 98.1 F (36.7 C) (Temporal)   Resp 16   Ht 5' 6 (1.676 m)   Wt 185 lb 8 oz (84.1 kg)   LMP 07/24/2018 (Exact Date)   SpO2 98%   BMI 29.94 kg/m   BP Readings from Last 3 Encounters:  01/06/24 (!) 144/86  12/28/23 (!) 143/74  12/09/23 104/72    Wt Readings from Last 3 Encounters:  01/06/24 185 lb 8 oz (84.1 kg)  12/28/23 185 lb 3 oz (84 kg)  12/09/23 185 lb (83.9 kg)    Physical  Exam Vitals reviewed.  Constitutional:      Appearance: Normal appearance.  HENT:     Mouth/Throat:     Mouth: Mucous membranes are moist.   Eyes:     General: No scleral icterus.    Conjunctiva/sclera: Conjunctivae normal.    Cardiovascular:     Rate and Rhythm: Normal rate and regular rhythm.     Heart sounds: No murmur heard.    Comments: EKG-- NSR, 71 bpm LAE No LVH, Q waves, or ST/T wave changes  Pulmonary:     Effort: Pulmonary effort is normal.     Breath sounds: No stridor.  No wheezing, rhonchi or rales.  Abdominal:     General: Abdomen is flat.     Palpations: There is no mass.     Tenderness: There is no abdominal tenderness. There is no guarding.     Hernia: No hernia is present.   Musculoskeletal:     Cervical back: Neck supple.     Right lower leg: Edema present.     Left lower leg: No edema.  Lymphadenopathy:     Cervical: No cervical adenopathy.   Skin:    General: Skin is warm and dry.     Findings: Lesion present.   Neurological:     General: No focal deficit present.     Mental Status: She is alert. Mental status is at baseline.   Psychiatric:        Mood and Affect: Mood normal.        Behavior: Behavior normal.     Lab Results  Component Value Date   WBC 6.1 12/28/2023   HGB 15.1 (H) 12/28/2023   HCT 43.8 12/28/2023   PLT 248 12/28/2023   GLUCOSE 110 (H) 12/28/2023   CHOL 209 (H) 11/28/2023   TRIG 116.0 11/28/2023   HDL 50.00 11/28/2023   LDLCALC 135 (H) 11/28/2023   ALT 22 12/28/2023   AST 29 12/28/2023   NA 137 12/28/2023   K 3.9 12/28/2023   CL 103 12/28/2023   CREATININE 0.89 12/28/2023   BUN 15 12/28/2023   CO2 24 12/28/2023   TSH 2.53 07/07/2023   INR 2.80 03/20/2012   HGBA1C 6.3 11/28/2023   MICROALBUR <0.7 11/28/2023    CT Head Wo Contrast Result Date: 12/28/2023 CLINICAL DATA:  Head trauma, abnormal mental status (Age 52-64y) Panic Attack; level 2 trauma Fall face first EXAM: CT HEAD WITHOUT CONTRAST TECHNIQUE: Contiguous axial images were obtained from the base of the skull through the vertex without intravenous contrast. RADIATION DOSE REDUCTION: This exam was performed according to the departmental dose-optimization program which includes automated exposure control, adjustment of the mA and/or kV according to patient size and/or use of iterative reconstruction technique. COMPARISON:  CT head 01/22/2022 FINDINGS: Brain: No evidence of large-territorial acute infarction. No parenchymal hemorrhage. No mass  lesion. No extra-axial collection. No mass effect or midline shift. No hydrocephalus. Basilar cisterns are patent. Vascular: No hyperdense vessel. Skull: No acute fracture or focal lesion. Sinuses/Orbits: Paranasal sinuses and mastoid air cells are clear. The orbits are unremarkable. Other: None. IMPRESSION: No acute intracranial abnormality. Electronically Signed   By: Morgane  Naveau M.D.   On: 12/28/2023 01:42    Assessment & Plan:  CTEPH (chronic thromboembolic pulmonary hypertension) (HCC) -     D-dimer, quantitative; Future -     Rivaroxaban ; Take 1 tablet (20 mg total) by mouth every morning.  Dispense: 90 tablet; Refill: 0  Pain and swelling of right lower leg -  D-dimer, quantitative; Future -     VAS US  LOWER EXTREMITY VENOUS (DVT); Future  Chest pain, unspecified type -     EKG 12-Lead  Primary hypertension- BP is adequately well controlled. -     EKG 12-Lead  Type II diabetes mellitus with manifestations (HCC) -     HM Diabetes Foot Exam  Hyperlipidemia with target LDL less than 100 -     CK; Future  Recurrent pulmonary embolism (HCC) -     Rivaroxaban ; Take 1 tablet (20 mg total) by mouth every morning.  Dispense: 90 tablet; Refill: 0     Follow-up: Return in about 3 months (around 04/07/2024).  Sandra Crouch, MD

## 2024-01-11 MED ORDER — RIVAROXABAN 20 MG PO TABS: 20.0000 mg | ORAL_TABLET | Freq: Every morning | ORAL | 0 refills | Status: AC

## 2024-01-13 ENCOUNTER — Telehealth (HOSPITAL_COMMUNITY): Payer: Self-pay

## 2024-01-13 ENCOUNTER — Other Ambulatory Visit (INDEPENDENT_AMBULATORY_CARE_PROVIDER_SITE_OTHER)

## 2024-01-13 DIAGNOSIS — E785 Hyperlipidemia, unspecified: Secondary | ICD-10-CM

## 2024-01-13 DIAGNOSIS — I2724 Chronic thromboembolic pulmonary hypertension: Secondary | ICD-10-CM

## 2024-01-13 DIAGNOSIS — M7989 Other specified soft tissue disorders: Secondary | ICD-10-CM

## 2024-01-13 LAB — CK: Total CK: 200 U/L — ABNORMAL HIGH (ref 7–177)

## 2024-01-13 LAB — D-DIMER, QUANTITATIVE: D-Dimer, Quant: 0.19 ug{FEU}/mL (ref <0.50)

## 2024-01-13 NOTE — Addendum Note (Signed)
 Addended by: Arcadio Knuckles on: 01/13/2024 03:58 PM   Modules accepted: Orders

## 2024-01-13 NOTE — Telephone Encounter (Signed)
 Attempted to contact the patient to schedule VAS US .  No answer.  Left message.  Third Attempt. Provided  direct contact number for scheduling: [phone].   One day after third attempt to contact, patient has yet to respond, sent request to referring provider on how to proceed. Referring provider did not respond.

## 2024-02-27 ENCOUNTER — Ambulatory Visit: Admitting: Internal Medicine

## 2024-03-22 ENCOUNTER — Encounter: Payer: Self-pay | Admitting: Internal Medicine

## 2024-03-22 ENCOUNTER — Ambulatory Visit: Admitting: Internal Medicine

## 2024-03-22 VITALS — BP 132/82 | HR 77 | Temp 98.0°F | Ht 66.0 in | Wt 184.6 lb

## 2024-03-22 DIAGNOSIS — E118 Type 2 diabetes mellitus with unspecified complications: Secondary | ICD-10-CM | POA: Diagnosis not present

## 2024-03-22 DIAGNOSIS — J301 Allergic rhinitis due to pollen: Secondary | ICD-10-CM | POA: Diagnosis not present

## 2024-03-22 DIAGNOSIS — I739 Peripheral vascular disease, unspecified: Secondary | ICD-10-CM | POA: Diagnosis not present

## 2024-03-22 DIAGNOSIS — R252 Cramp and spasm: Secondary | ICD-10-CM | POA: Insufficient documentation

## 2024-03-22 DIAGNOSIS — M170 Bilateral primary osteoarthritis of knee: Secondary | ICD-10-CM

## 2024-03-22 MED ORDER — FLUTICASONE PROPIONATE 50 MCG/ACT NA SUSP: 1.0000 | Freq: Every day | NASAL | 2 refills | Status: DC

## 2024-03-22 MED ORDER — DICLOFENAC SODIUM 1 % EX GEL: 2.0000 g | Freq: Four times a day (QID) | CUTANEOUS | 0 refills | Status: AC

## 2024-03-22 NOTE — Patient Instructions (Signed)
 Poor Blood Flow (Peripheral Vascular Disease): What to Know  Peripheral vascular disease (PVD) is a disease of the blood vessels. It usually affects arteries, which are the blood vessels that carry blood from the heart to the rest of the body. PVD is also called peripheral artery disease (PAD) or poor blood flow. PVD can result in less blood flowing to the arms, legs, and organs such as the stomach or kidneys. It most often affects the lower legs and feet. Without treatment, PVD often gets worse. PVD can lead to acute limb ischemia. This happens when there's a sudden stop of blood flow to an arm or leg. This is a medical emergency. What are the causes? The most common cause of PVD is a buildup of a fatty substance called plaque inside the arteries. This decreases blood flow. Plaque can also break off and block blood flow in small arteries. Here are some other common causes of PVD: Blood clots inside the blood vessels. Injuries to blood vessels. Irritation and swelling of blood vessels. Sudden tightening, or spasms, of the blood vessel. What increases the risk? Using tobacco or nicotine products. A family history of PVD. Common medical conditions, including: High cholesterol. Diabetes. High blood pressure. Heart disease. Other conditions, such as: Buerger's disease. This is caused by swollen and irritated blood vessels in your hands and feet. Past problems with blocked blood vessels. Kidney disease. Not getting enough exercise. Being very overweight. What are the signs or symptoms? Symptoms depend on what body part isn't getting enough blood. They may include: Cramps in your butt, legs, and feet. Pain and weakness in your legs when you're active that goes away when you rest. Leg pain when at rest along with leg numbness, tingling, or weakness. Coldness in a leg or foot, especially when compared to the other leg or foot. Skin or hair changes on the arms or legs, such as hair loss or  shiny skin. Erectile dysfunction. This means not being able to get or keep an erection. Weak pulse or no pulse in the feet. People with PVD are more likely to get open wounds and sores on their toes, feet, or legs. The sores may take longer than normal to heal. How is this diagnosed? PVD is diagnosed based on your symptoms, a physical exam, and your medical history. You may also have tests to find the cause. These may include: Ankle-brachial index test.This test compares the blood pressure readings of the legs and arms. Doppler ultrasound. This takes pictures of blood flow through your blood vessels. Imaging tests that use dye to show blood flow. These are: CT angiogram. Magnetic resonance angiogram, or MRA. How is this treated? The cause of your PVD is treated first. Other conditions, like diabetes or high cholesterol, are also treated. Treatment may include: Quitting smoking or tobacco use. Exercise programs that your health care provider says are right for you. Taking medicines, such as: Blood thinners to prevent blood clots. Medicines to improve blood flow. Medicines to improve cholesterol levels. Procedures to: Open a blocked artery and restore blood flow. This is called angioplasty. Make a new path for blood to flow around a blocked artery. This is called peripheral bypass surgery. Remove an affected leg or arm. This may be needed in cases of acute limb ischemia when other treatments haven't helped. Follow these instructions at home: Medicines Take your medicines only as told. If you're taking blood thinners: Take your medicines as told. Take them at the same time each day. Talk with  your provider before taking any products you can buy at the store. Do not do things that could hurt or bruise you. Be careful to avoid falls. Wear an alert bracelet or carry a card that says you take blood thinners. Lifestyle  Do not smoke, vape, or use nicotine or tobacco. Get regular exercise.  Ask your provider about good activities for you. Eat a diet that's low in fat and cholesterol. Talk with your provider about staying at a healthy weight. If needed, ask about losing weight. Do not drink alcohol. General instructions Take good care of your feet. To do this: Wear shoes that fit well and feel good. Check your feet often for any cuts or sores. Get vaccines as recommended by your provider. Where to find more information American Heart Association: heart.org National Heart, Lung, and Blood Institute: [employer] Contact a health care provider if: You have cramps in your legs when you walk. You have leg pain when you're resting. Your leg or foot feels cold. Your skin changes color. You can't get or keep an erection. You have cuts or sores on your legs or feet that don't heal. Get help right away if: You have chest pain or trouble breathing. You have sudden changes in the color and feeling of your arms or legs, such as: Your arm or leg turns cold, numb, and blue. Your arm or leg becomes red, warm, swollen, painful, or numb. You have any signs of a stroke. "BE FAST" is an easy way to remember the main warning signs: B - Balance. Feeling dizzy, sudden trouble walking, or loss of balance. E - Eyes. Trouble seeing or a change in how you see. F - Face. Sudden weakness or feeling numb in the face. The face or eyelid may droop on one side. A - Arms. Weakness or loss of feeling in an arm. This happens fast and often only on one side. S - Speech. Sudden trouble speaking, slurred speech, or trouble understanding what people say. T - Time. Time to call 911. Write down what time symptoms started. Other signs of a stroke can be: A sudden, very bad headache with no known cause. Feeling like you may throw up. Throwing up. These symptoms may be an emergency. Call 911 right away. Do not wait to see if the symptoms will go away. Do not drive yourself to the hospital. This information  is not intended to replace advice given to you by your health care provider. Make sure you discuss any questions you have with your health care provider. Document Revised: 05/31/2023 Document Reviewed: 12/28/2022 Elsevier Patient Education  2024 [employer].

## 2024-03-22 NOTE — Progress Notes (Unsigned)
 Subjective:  Patient ID: [name], female    DOB: 01-30-1971  Age: 53 y.o. MRN: [id number]  CC: cramps (Bilateral Leg cramps all the time. When she wakes up in the morning she can't really stand.  )   HPI [name] presents for f/up ---  Discussed the use of AI scribe software for clinical note transcription with the patient, who gave verbal consent to proceed.  History of Present Illness [name] is a 53 year old female who presents with leg cramps and joint pain.  She experiences leg cramps both when walking and sitting, with the pain being particularly severe in the mornings upon waking, making it difficult to stand. The pain is described as 'too much'.  She also has joint and muscle pain in her legs. She has been taking muscle relaxants for pain management. She notes that the pain is worsening day by day.  She is planning to travel out of town next week for a month-long prayer trip to [place]  No stomach pain or issues are reported.   Outpatient Medications Prior to Visit  Medication Sig Dispense Refill   acetaminophen  (TYLENOL ) 500 MG tablet Take 500 mg by mouth every 6 (six) hours as needed for headache (pain).     Multiple Vitamins-Minerals (CENTRUM ADULTS) TABS Take by mouth.     rivaroxaban  (XARELTO ) 20 MG TABS tablet Take 1 tablet (20 mg total) by mouth every morning. 90 tablet 0   tiZANidine  (ZANAFLEX ) 2 MG tablet Take 1 tablet (2 mg total) by mouth every 8 (eight) hours as needed for muscle spasms. 270 tablet 0   fluticasone  (FLONASE ) 50 MCG/ACT nasal spray Place 1 spray into both nostrils daily. 16 g 2   rosuvastatin  (CRESTOR ) 10 MG tablet Take 1 tablet (10 mg total) by mouth daily. (Patient not taking: Reported on 03/22/2024) 90 tablet 1   No facility-administered medications prior to visit.    ROS Review of Systems  Constitutional:  Negative for appetite change, chills, diaphoresis, fatigue and fever.  HENT:  Positive for congestion,  postnasal drip and rhinorrhea. Negative for nosebleeds and sinus pressure.   Eyes: Negative.   Respiratory:  Negative for cough, chest tightness, shortness of breath and wheezing.   Cardiovascular:  Negative for chest pain, palpitations and leg swelling.  Gastrointestinal: Negative.  Negative for abdominal pain, blood in stool, constipation, diarrhea, nausea and vomiting.  Endocrine: Negative.   Genitourinary: Negative.  Negative for difficulty urinating and dysuria.  Musculoskeletal:  Positive for arthralgias and myalgias. Negative for gait problem and joint swelling.  Skin: Negative.   Neurological:  Negative for dizziness, weakness, light-headedness and numbness.  Hematological:  Negative for adenopathy. Does not bruise/bleed easily.    Objective:  BP 132/82 (BP Location: Left Arm, Patient Position: Sitting, Cuff Size: Normal)   Pulse 77   Temp 98 F (36.7 C) (Oral)   Ht 5' 6 (1.676 m)   Wt 184 lb 9.6 oz (83.7 kg)   LMP 07/24/2018 (Exact Date)   SpO2 98%   BMI 29.80 kg/m   BP Readings from Last 3 Encounters:  03/22/24 132/82  01/06/24 (!) 144/86  12/28/23 (!) 143/74    Wt Readings from Last 3 Encounters:  03/22/24 184 lb 9.6 oz (83.7 kg)  01/06/24 185 lb 8 oz (84.1 kg)  12/28/23 185 lb 3 oz (84 kg)    Physical Exam Vitals reviewed.  Constitutional:      Appearance: Normal appearance.  HENT:  Nose: Nose normal.     Mouth/Throat:     Mouth: Mucous membranes are moist.  Eyes:     General: No scleral icterus.    Conjunctiva/sclera: Conjunctivae normal.  Cardiovascular:     Rate and Rhythm: Normal rate and regular rhythm.     Pulses:          Dorsalis pedis pulses are 1+ on the right side and 0 on the left side.       Posterior tibial pulses are 0 on the right side and 0 on the left side.     Heart sounds: No murmur heard.    No friction rub. No gallop.  Pulmonary:     Effort: Pulmonary effort is normal.     Breath sounds: No stridor. No wheezing, rhonchi or  rales.  Abdominal:     General: Abdomen is flat.     Palpations: There is no mass.     Tenderness: There is no abdominal tenderness. There is no guarding.     Hernia: No hernia is present.  Musculoskeletal:        General: Normal range of motion.     Cervical back: Neck supple.     Right lower leg: No edema.     Left lower leg: No edema.  Feet:     Right foot:     Skin integrity: Skin integrity normal.     Toenail Condition: Right toenails are normal.     Left foot:     Skin integrity: Skin integrity normal.     Toenail Condition: Left toenails are normal.  Lymphadenopathy:     Cervical: No cervical adenopathy.  Skin:    General: Skin is warm and dry.  Neurological:     General: No focal deficit present.     Mental Status: She is alert. Mental status is at baseline.  Psychiatric:        Mood and Affect: Mood normal.        Behavior: Behavior normal.     Lab Results  Component Value Date   WBC 6.1 12/28/2023   HGB 15.1 (H) 12/28/2023   HCT 43.8 12/28/2023   PLT 248 12/28/2023   GLUCOSE 110 (H) 12/28/2023   CHOL 209 (H) 11/28/2023   TRIG 116.0 11/28/2023   HDL 50.00 11/28/2023   LDLCALC 135 (H) 11/28/2023   ALT 22 12/28/2023   AST 29 12/28/2023   NA 137 12/28/2023   K 3.9 12/28/2023   CL 103 12/28/2023   CREATININE 0.89 12/28/2023   BUN 15 12/28/2023   CO2 24 12/28/2023   TSH 2.53 07/07/2023   INR 2.80 03/20/2012   HGBA1C 6.3 11/28/2023   MICROALBUR <0.7 11/28/2023    CT Head Wo Contrast Result Date: 12/28/2023 CLINICAL DATA:  Head trauma, abnormal mental status (Age 30-64y) Panic Attack; level 2 trauma Fall face first EXAM: CT HEAD WITHOUT CONTRAST TECHNIQUE: Contiguous axial images were obtained from the base of the skull through the vertex without intravenous contrast. RADIATION DOSE REDUCTION: This exam was performed according to the departmental dose-optimization program which includes automated exposure control, adjustment of the mA and/or kV according to  patient size and/or use of iterative reconstruction technique. COMPARISON:  CT head 01/22/2022 FINDINGS: Brain: No evidence of large-territorial acute infarction. No parenchymal hemorrhage. No mass lesion. No extra-axial collection. No mass effect or midline shift. No hydrocephalus. Basilar cisterns are patent. Vascular: No hyperdense vessel. Skull: No acute fracture or focal lesion. Sinuses/Orbits: Paranasal sinuses and mastoid air cells  are clear. The orbits are unremarkable. Other: None. IMPRESSION: No acute intracranial abnormality. Electronically Signed   By: Morgane  Naveau M.D.   On: 12/28/2023 01:42    Assessment & Plan:   Muscle cramps- Will recheck her CK. -     Myoglobin, serum; Future -     CK; Future  Seasonal allergic rhinitis due to pollen -     Fluticasone  Propionate; Place 1 spray into both nostrils daily.  Dispense: 16 g; Refill: 2  Type II diabetes mellitus with manifestations (HCC) -     Hemoglobin A1c; Future  Claudication of both lower extremities (HCC) -     VAS US  ABI WITH/WO TBI; Future  Primary osteoarthritis of both knees -     Diclofenac  Sodium; Apply 2 g topically 4 (four) times daily.  Dispense: 350 g; Refill: 0     Follow-up: Return in about 3 months (around 06/22/2024).  Debby Molt, MD

## 2024-04-11 ENCOUNTER — Ambulatory Visit: Admitting: Internal Medicine

## 2024-05-11 ENCOUNTER — Ambulatory Visit (HOSPITAL_COMMUNITY)
Admission: RE | Admit: 2024-05-11 | Discharge: 2024-05-11 | Disposition: A | Discharge status: Home / Self Care | Source: Ambulatory Visit | Attending: Internal Medicine | Admitting: Internal Medicine

## 2024-05-11 DIAGNOSIS — I739 Peripheral vascular disease, unspecified: Secondary | ICD-10-CM | POA: Insufficient documentation

## 2024-05-11 LAB — VAS US ABI WITH/WO TBI
Left ABI: 1.22
Right ABI: 1.12

## 2024-06-14 ENCOUNTER — Ambulatory Visit: Payer: Self-pay

## 2024-06-14 NOTE — Telephone Encounter (Signed)
 Can you see her earlier than the 26th ?

## 2024-06-14 NOTE — Telephone Encounter (Signed)
 Called [facility] to advise them of E.D. Refusal

## 2024-06-14 NOTE — Telephone Encounter (Addendum)
 She is advised that the recommendation at this time is the Emergency Room but she refused She states she didn't want to go & wait all day there and be told to go see her PCP She is advised that the ER is the recommendation at this time but still just wants to see her PCP   FYI Only or Action Required?: Action required by provider: request for appointment, clinical question for provider, and update on patient condition.  Patient was last seen in primary care on 03/22/2024 by Joshua Debby CROME, MD.  Called Nurse Triage reporting Loss of Vision.  Symptoms began today.  Interventions attempted: Nothing.  Symptoms are: patient states her vision has returned.  Triage Disposition: Go to ED Now (Notify PCP)  Patient/caregiver understands and will follow disposition?: No, wishes to speak with PCP              Copied from CRM [id number]. Topic: Clinical - Red Word Triage >> Jun 14, 2024  7:35 AM Carlyon D wrote: Red Word that prompted transfer to Nurse Triage: woke up to legs cramp very painful, Woke up couldn't see everything was dark even thought pt had the lights on, very dizzy, dry mouth, Everywhere was completely dark pt could not see for about 6 min. First time this happened to her Reason for Disposition  Complete loss of vision in one or both eyes  Answer Assessment - Initial Assessment Questions Legs cramped --severe Dry Mouth Dizziness  Patient states when she woke up she had complete vision loss and everything was black.  She states that her lights were on as she always sleeps with them on but she could not see anything States it took about 6 minutes for her vision to return and she was going to call 911 until her vision came back Sleeps with CPAP Sleeps with light on  She is advised that the recommendation at this time is the Emergency Room but she refused She states she didn't want to go there and be told to go see her PCP She is advised that the ER is the  recommendation at this time but still just wants to see her PCP  Protocols used: Vision Loss or Change-A-AH

## 2024-06-17 ENCOUNTER — Emergency Department (HOSPITAL_COMMUNITY)
Admission: EM | Admit: 2024-06-17 | Discharge: 2024-06-18 | Disposition: A | Discharge status: Home / Self Care | Attending: Emergency Medicine | Admitting: Emergency Medicine

## 2024-06-17 ENCOUNTER — Encounter (HOSPITAL_COMMUNITY): Payer: Self-pay

## 2024-06-17 ENCOUNTER — Other Ambulatory Visit: Payer: Self-pay

## 2024-06-17 ENCOUNTER — Ambulatory Visit
Admission: EM | Admit: 2024-06-17 | Discharge: 2024-06-17 | Disposition: A | Discharge status: Other Acute Inpatient Hospital | Attending: Nurse Practitioner | Admitting: Nurse Practitioner

## 2024-06-17 ENCOUNTER — Emergency Department (HOSPITAL_COMMUNITY)

## 2024-06-17 DIAGNOSIS — G43801 Other migraine, not intractable, with status migrainosus: Secondary | ICD-10-CM | POA: Insufficient documentation

## 2024-06-17 DIAGNOSIS — R519 Headache, unspecified: Secondary | ICD-10-CM

## 2024-06-17 DIAGNOSIS — H538 Other visual disturbances: Secondary | ICD-10-CM

## 2024-06-17 DIAGNOSIS — R42 Dizziness and giddiness: Secondary | ICD-10-CM

## 2024-06-17 LAB — CBC WITH DIFFERENTIAL/PLATELET
Abs Immature Granulocytes: 0 K/uL (ref 0.00–0.07)
Basophils Absolute: 0.1 K/uL (ref 0.0–0.1)
Basophils Relative: 1 %
Eosinophils Absolute: 0.1 K/uL (ref 0.0–0.5)
Eosinophils Relative: 1 %
HCT: 43.6 % (ref 36.0–46.0)
Hemoglobin: 14.7 g/dL (ref 12.0–15.0)
Immature Granulocytes: 0 %
Lymphocytes Relative: 46 %
Lymphs Abs: 2.1 K/uL (ref 0.7–4.0)
MCH: 29 pg (ref 26.0–34.0)
MCHC: 33.7 g/dL (ref 30.0–36.0)
MCV: 86 fL (ref 80.0–100.0)
Monocytes Absolute: 0.7 K/uL (ref 0.1–1.0)
Monocytes Relative: 16 %
Neutro Abs: 1.6 K/uL — ABNORMAL LOW (ref 1.7–7.7)
Neutrophils Relative %: 36 %
Platelets: 245 K/uL (ref 150–400)
RBC: 5.07 MIL/uL (ref 3.87–5.11)
RDW: 13.7 % (ref 11.5–15.5)
WBC: 4.6 K/uL (ref 4.0–10.5)
nRBC: 0 % (ref 0.0–0.2)

## 2024-06-17 LAB — I-STAT CHEM 8, ED
BUN: 16 mg/dL (ref 6–20)
Calcium, Ion: 1.25 mmol/L (ref 1.15–1.40)
Chloride: 105 mmol/L (ref 98–111)
Creatinine, Ser: 1.1 mg/dL — ABNORMAL HIGH (ref 0.44–1.00)
Glucose, Bld: 85 mg/dL (ref 70–99)
HCT: 44 % (ref 36.0–46.0)
Hemoglobin: 15 g/dL (ref 12.0–15.0)
Potassium: 4.4 mmol/L (ref 3.5–5.1)
Sodium: 141 mmol/L (ref 135–145)
TCO2: 26 mmol/L (ref 22–32)

## 2024-06-17 LAB — MAGNESIUM: Magnesium: 2.3 mg/dL (ref 1.7–2.4)

## 2024-06-17 LAB — GLUCOSE, POCT (MANUAL RESULT ENTRY): POCT Glucose (KUC): 148 mg/dL — AB (ref 70–99)

## 2024-06-17 LAB — CK: Total CK: 207 U/L (ref 38–234)

## 2024-06-17 LAB — COMPREHENSIVE METABOLIC PANEL WITH GFR
ALT: 20 U/L (ref 0–44)
AST: 28 U/L (ref 15–41)
Albumin: 3.8 g/dL (ref 3.5–5.0)
Alkaline Phosphatase: 72 U/L (ref 38–126)
Anion gap: 9 (ref 5–15)
BUN: 14 mg/dL (ref 6–20)
CO2: 24 mmol/L (ref 22–32)
Calcium: 9.6 mg/dL (ref 8.9–10.3)
Chloride: 105 mmol/L (ref 98–111)
Creatinine, Ser: 0.85 mg/dL (ref 0.44–1.00)
GFR, Estimated: >60 mL/min (ref >60)
Glucose, Bld: 82 mg/dL (ref 70–99)
Potassium: 4.3 mmol/L (ref 3.5–5.1)
Sodium: 138 mmol/L (ref 135–145)
Total Bilirubin: 0.7 mg/dL (ref 0.0–1.2)
Total Protein: 7.8 g/dL (ref 6.5–8.1)

## 2024-06-17 LAB — TROPONIN I (HIGH SENSITIVITY): Troponin I (High Sensitivity): 3 ng/L (ref <18)

## 2024-06-17 MED ORDER — IOHEXOL 350 MG/ML SOLN
75.0000 mL | Freq: Once | INTRAVENOUS | Status: AC | PRN
  Administered 2024-06-17: 75 mL via INTRAVENOUS

## 2024-06-17 NOTE — ED Notes (Signed)
 Patient is being discharged from the Urgent Care and sent to the Emergency Department via POV . Per Myla Bold, NP, patient is in need of higher level of care due to needing further testing. Patient is aware and verbalizes understanding of plan of care.  Vitals:   06/17/24 1537  BP: 120/81  Pulse: 90  Resp: 17  Temp: 98.1 F (36.7 C)  SpO2: 97%

## 2024-06-17 NOTE — ED Triage Notes (Signed)
 Pt states she was walking to [employer] and became dizzy, lightheaded and dry mouth around 1400 today. Pt states she checked her glucose at [facility] today and it was 173 so her daughter told her to go to the ED right away.

## 2024-06-17 NOTE — ED Provider Triage Note (Signed)
 Emergency Medicine Provider Triage Evaluation Note  [name] , a 53 y.o. female  was evaluated in triage.  Pt complains of dizziness since Wednesday.  Patient reports that she woke up Wednesday when she start experiencing dizziness.  She reports that Wednesday night she started to feel burning in her feet which woke her while she was on her CPAP and had completely black vision for around 5 to 6 minutes and then her vision slowly started to come back.  Reports that since then she has had pulsatile tinnitus in her bilateral ears and have having dizziness.  She reports he is having some lateral chest pain on both sides but no shortness of breath or palpitations.  Reports that earlier today she had some diplopia that soon resolved.  She is having pain at the more crown of her head.  Was not sudden onset.  No thunderclap headache.  She was sent over from urgent care given her multiple symptoms.  She feels that her vision is at its baseline now.  No nausea or vomiting.  Patient reports that she does have history of dizziness however this feels different because of the vision changes.  Review of Systems  Positive:  Negative:   Physical Exam  BP 136/84 (BP Location: Right Arm)   Pulse 80   Temp 97.7 F (36.5 C)   Resp 17   LMP 07/24/2018 (Exact Date)   SpO2 99%  Gen:   Awake, no distress   Resp:  Normal effort  MSK:   Moves extremities without difficulty  Other:  Cranial nerves grossly intact.  Strength intact in upper and lower bilateral strongly.  Medical Decision Making  Medically screening exam initiated at 6:17 PM.  Appropriate orders placed.  [name] was informed that the remainder of the evaluation will be completed by another provider, this initial triage assessment does not replace that evaluation, and the importance of remaining in the ED until their evaluation is complete.  This is a my attending.  Given postop tenderness as well as patient's history, will order CTA of  the head and neck.  She is not medical deficit and we are outside of any stroke window.  Will continue workup.   [name], [place] 06/17/24 [id number]

## 2024-06-17 NOTE — ED Triage Notes (Signed)
 QUICK TRIAGE: Pt ambulatory to ER with c/o dizziness for last several days.  Also reports headache.

## 2024-06-17 NOTE — ED Notes (Signed)
 Pt called for repeat trop. Pt did not respond.

## 2024-06-17 NOTE — Discharge Instructions (Addendum)
 Please go to the emergency room for further evaluation of your symptoms

## 2024-06-17 NOTE — ED Provider Notes (Signed)
 [facility]    CSN: [id number] Arrival date & time: 06/17/24  1530      History   Chief Complaint No chief complaint on file.   HPI [name] is a 53 y.o. female with a past medical history of diabetes, pulmonary embolus/DVT, OSA, hyperlipidemia, hypertension presents for dizziness.  Patient states she was walking around 2 PM today when she became very dizzy and lightheaded with a dry mouth and blurry vision.  She thought it was secondary to her blood sugar although she does admit she does not check her blood sugar frequently.  She states she went to [facility] and checked it and it was 173.  She called her daughter who told her to go to the emergency room.  Patient came to urgent care and still reports a persistent dizziness with blurry vision and global headache that she rates as a 6 out of 10.  Denies worst headache of life.  Denies thunderclap headache.  No unilateral weakness, chest pain, shortness of breath.  No URI symptoms.  Denies history of vertigo.  No ear pain.  She last ate around 1230 this afternoon and drink water prior to coming to the clinic.  No dysuria.  No other concerns at this time.  HPI  Past Medical History:  Diagnosis Date   CTEPH (chronic thromboembolic pulmonary hypertension) (HCC)    Diabetes mellitus without complication (HCC)    DVT (deep venous thrombosis) (HCC)    Elevated cholesterol    Hepatitis B infection    Hep B core Ab 04/2005   Primary hypercoagulable state    Pulmonary embolism (HCC) 08/03/2003       Pulmonary embolism (HCC)    Seasonal allergies     Patient Active Problem List   Diagnosis Date Noted   Claudication of both lower extremities 03/22/2024   Muscle cramps 03/22/2024   Primary osteoarthritis of both knees 03/22/2024   Well woman exam with routine gynecological exam 12/09/2023   Immunization due 12/07/2023   Need for prophylactic vaccination and inoculation against varicella 11/28/2023   OSA (obstructive  sleep apnea) 03/24/2022   Recurrent pulmonary embolism (HCC) 03/24/2022   DDD (degenerative disc disease), lumbar 08/04/2021   Primary hypertension 02/27/2021   Hyperlipidemia with target LDL less than 100 02/26/2021   Type II diabetes mellitus with manifestations (HCC) 02/26/2021   Encounter for general adult medical examination with abnormal findings 02/24/2021   Screening for cervical cancer 02/24/2021   Mild intermittent asthma without complication 08/17/2018   Iron deficiency anemia 12/18/2015   Allergic rhinitis 03/11/2012   Antiphospholipid syndrome (HCC) 05/24/2011   Hepatitis B infection    CTEPH (chronic thromboembolic pulmonary hypertension) (HCC) 10/05/2010   Long term current use of anticoagulant 10/05/2010    Past Surgical History:  Procedure Laterality Date   CARDIAC CATHETERIZATION  11/2005   R heart cath : Severe pulmonary arterial HTN with evidence of cor pulmonale. Vasodilatory challenge not performed.   Pulmonary artery endarterectomy  2007   VENA CAVA FILTER PLACEMENT      OB History     Gravida  4   Para  3   Term  3   Preterm  0   AB  1   Living  3      SAB  1   IAB  0   Ectopic  0   Multiple      Live Births               Home  Medications    Prior to Admission medications   Medication Sig Start Date End Date Taking? Authorizing Provider  acetaminophen  (TYLENOL ) 500 MG tablet Take 500 mg by mouth every 6 (six) hours as needed for headache (pain).    [provider]  diclofenac  Sodium (VOLTAREN  ARTHRITIS PAIN) 1 % GEL Apply 2 g topically 4 (four) times daily. 03/22/24   Joshua Debby CROME, MD  fluticasone  (FLONASE ) 50 MCG/ACT nasal spray Place 1 spray into both nostrils daily. 03/22/24   Joshua Debby CROME, MD  Multiple Vitamins-Minerals (CENTRUM ADULTS) TABS Take by mouth.    [provider]  rivaroxaban  (XARELTO ) 20 MG TABS tablet Take 1 tablet (20 mg total) by mouth every morning. 01/11/24   Joshua Debby CROME, MD   rosuvastatin  (CRESTOR ) 10 MG tablet Take 1 tablet (10 mg total) by mouth daily. Patient not taking: Reported on 03/22/2024 12/07/23   Joshua Debby CROME, MD  tiZANidine  (ZANAFLEX ) 2 MG tablet Take 1 tablet (2 mg total) by mouth every 8 (eight) hours as needed for muscle spasms. 11/28/23   Joshua Debby CROME, MD    Family History Family History  Problem Relation Age of Onset   Hypertension Mother    Hypertension Father        deceased   Breast cancer Neg Hx     Social History Social History   Tobacco Use   Smoking status: Never    Passive exposure: Never   Smokeless tobacco: Never  Vaping Use   Vaping status: Never Used  Substance Use Topics   Alcohol use: No    Alcohol/week: 0.0 standard drinks of alcohol   Drug use: No     Allergies   Aspirin   Review of Systems Review of Systems  Eyes:  Positive for visual disturbance.  Neurological:  Positive for dizziness and headaches.     Physical Exam Triage Vital Signs ED Triage Vitals  Encounter Vitals Group     BP 06/17/24 1537 120/81     Girls Systolic BP Percentile --      Girls Diastolic BP Percentile --      Boys Systolic BP Percentile --      Boys Diastolic BP Percentile --      Pulse Rate 06/17/24 1537 90     Resp 06/17/24 1537 17     Temp 06/17/24 1537 98.1 F (36.7 C)     Temp Source 06/17/24 1537 Oral     SpO2 06/17/24 1537 97 %     Weight --      Height --      Head Circumference --      Peak Flow --      Pain Score 06/17/24 1536 6     Pain Loc --      Pain Education --      Exclude from Growth Chart --    No data found.  Updated Vital Signs BP 120/81   Pulse 90   Temp 98.1 F (36.7 C) (Oral)   Resp 17   LMP 07/24/2018 (Exact Date)   SpO2 97%   Visual Acuity Right Eye Distance:   Left Eye Distance:   Bilateral Distance:    Right Eye Near:   Left Eye Near:    Bilateral Near:     Physical Exam Vitals and nursing note reviewed.  Constitutional:      General: She is not in acute  distress.    Appearance: Normal appearance. She is not ill-appearing.  HENT:  Head: Normocephalic and atraumatic.     Right Ear: Tympanic membrane and ear canal normal.     Left Ear: Tympanic membrane and ear canal normal.     Nose: Nose normal.  Eyes:     Extraocular Movements: Extraocular movements intact.     Conjunctiva/sclera: Conjunctivae normal.     Pupils: Pupils are equal, round, and reactive to light.  Cardiovascular:     Rate and Rhythm: Normal rate and regular rhythm.     Heart sounds: Normal heart sounds.  Pulmonary:     Effort: Pulmonary effort is normal.     Breath sounds: Normal breath sounds.  Skin:    General: Skin is warm and dry.  Neurological:     General: No focal deficit present.     Mental Status: She is alert and oriented to person, place, and time.     GCS: GCS eye subscore is 4. GCS verbal subscore is 5. GCS motor subscore is 6.     Cranial Nerves: No facial asymmetry.     Motor: No weakness.     Coordination: Romberg sign positive. Finger-Nose-Finger Test normal.     Gait: Tandem walk abnormal.  Psychiatric:        Mood and Affect: Mood normal.        Behavior: Behavior normal.      UC Treatments / Results  Labs (all labs ordered are listed, but only abnormal results are displayed) Labs Reviewed  GLUCOSE, POCT (MANUAL RESULT ENTRY) - Abnormal; Notable for the following components:      Result Value   POCT Glucose ([facility]) 148 (*)    All other components within normal limits    EKG   Radiology No results found.  Procedures ED EKG  Date/Time: 06/17/2024 4:02 PM  Performed by: Loreda Myla SAUNDERS, NP Authorized by: Loreda Myla SAUNDERS, NP   ECG interpreted by ED Physician in the absence of a cardiologist: no   Previous ECG:    Previous ECG:  Compared to current Interpretation:    Interpretation: normal   Rate:    ECG rate:  82   ECG rate assessment: normal   Rhythm:    Rhythm: sinus rhythm   Ectopy:    Ectopy: none   QRS:    QRS axis:   Normal   QRS conduction: normal   ST segments:    ST segments:  Normal T waves:    T waves: normal    (including critical care time)  Medications Ordered in UC Medications - No data to display  Initial Impression / Assessment and Plan / UC Course  I have reviewed the triage vital signs and the nursing notes.  Pertinent labs & imaging results that were available during my care of the patient were reviewed by me and considered in my medical decision making (see chart for details).     I reviewed exam and symptoms with patient.  Discussed limitations and abilities of urgent care.  Patient presenting with acute onset of dizziness with blurry vision and headache.  EKG with no acute changes.  Given patient medical history of PE with new onset dizziness headache and blurry vision advise she go to the emergency room for further evaluation.  She is in agreement with plan will go POV with her daughter to the ER. Final Clinical Impressions(s) / UC Diagnoses   Final diagnoses:  Dizziness  Acute intractable headache, unspecified headache type  Blurry vision     Discharge Instructions  Please go to the emergency room for further evaluation of your symptoms     ED Prescriptions   None    PDMP not reviewed this encounter.   Loreda Myla SAUNDERS, NP 06/17/24 [phone]

## 2024-06-18 LAB — TROPONIN I (HIGH SENSITIVITY): Troponin I (High Sensitivity): 4 ng/L (ref <18)

## 2024-06-18 MED ORDER — KETOROLAC TROMETHAMINE 15 MG/ML IJ SOLN
15.0000 mg | Freq: Once | INTRAMUSCULAR | Status: AC
  Administered 2024-06-18: 15 mg via INTRAVENOUS
  Filled 2024-06-18: qty 1

## 2024-06-18 MED ORDER — DIPHENHYDRAMINE HCL 50 MG/ML IJ SOLN
25.0000 mg | Freq: Once | INTRAMUSCULAR | Status: AC
  Administered 2024-06-18: 25 mg via INTRAVENOUS
  Filled 2024-06-18: qty 1

## 2024-06-18 MED ORDER — METOCLOPRAMIDE HCL 5 MG/ML IJ SOLN
10.0000 mg | Freq: Once | INTRAMUSCULAR | Status: AC
  Administered 2024-06-18: 10 mg via INTRAVENOUS
  Filled 2024-06-18: qty 2

## 2024-06-18 MED ORDER — LACTATED RINGERS IV BOLUS
1000.0000 mL | Freq: Once | INTRAVENOUS | Status: AC
  Administered 2024-06-18: 1000 mL via INTRAVENOUS

## 2024-06-18 NOTE — ED Provider Notes (Signed)
 [place] EMERGENCY DEPARTMENT AT [facility] Provider Note   CSN: [id number] Arrival date & time: 06/17/24  1643     History Chief Complaint  Patient presents with   Headache   Dizziness    HPI [name] is a 53 y.o. female presenting for chief complaint of dizziness, headaches, and dry mouth intermittently since last Wednesday. Hx of OSA and states that she has not been sleeping normally waking up with no vision for a few minutes when waking up in the middle of the night.  Endorses some blurry vision today.  Persistent headaches since last Wednesday with intermittent symptoms prior to this.  Patient's recorded medical, surgical, social, medication list and allergies were reviewed in the Snapshot window as part of the initial history.   Review of Systems   Review of Systems  Constitutional:  Positive for fatigue. Negative for chills and fever.  HENT:  Negative for ear pain and sore throat.   Eyes:  Negative for pain and visual disturbance.  Respiratory:  Negative for cough and shortness of breath.   Cardiovascular:  Negative for chest pain and palpitations.  Gastrointestinal:  Negative for abdominal pain and vomiting.  Genitourinary:  Negative for dysuria and hematuria.  Musculoskeletal:  Negative for arthralgias and back pain.  Skin:  Negative for color change and rash.  Neurological:  Positive for dizziness and headaches. Negative for seizures and syncope.  All other systems reviewed and are negative.   Physical Exam Updated Vital Signs BP 125/85 (BP Location: Left Arm)   Pulse 69   Temp (!) 97.2 F (36.2 C) (Oral)   Resp 14   LMP 07/24/2018 (Exact Date)   SpO2 100%  Physical Exam Constitutional:      General: She is not in acute distress.    Appearance: She is not ill-appearing or toxic-appearing.  HENT:     Head: Normocephalic and atraumatic.  Eyes:     Extraocular Movements: Extraocular movements intact.     Pupils: Pupils are equal,  round, and reactive to light.  Cardiovascular:     Rate and Rhythm: Normal rate.  Pulmonary:     Effort: No respiratory distress.  Abdominal:     General: Abdomen is flat.  Musculoskeletal:        General: No swelling, deformity or signs of injury.     Cervical back: Normal range of motion. No rigidity.  Skin:    General: Skin is warm and dry.  Neurological:     General: No focal deficit present.     Mental Status: She is alert and oriented to person, place, and time.  Psychiatric:        Mood and Affect: Mood normal.      ED Course/ Medical Decision Making/ A&P    Procedures Procedures   Medications Ordered in ED Medications  iohexol  (OMNIPAQUE ) 350 MG/ML injection 75 mL (75 mLs Intravenous Contrast Given 06/17/24 1903)  metoCLOPramide (REGLAN) injection 10 mg (10 mg Intravenous Given 06/18/24 0353)  diphenhydrAMINE (BENADRYL) injection 25 mg (25 mg Intravenous Given 06/18/24 0353)  lactated ringers bolus 1,000 mL (1,000 mLs Intravenous New Bag/Given 06/18/24 0353)  ketorolac  (TORADOL ) 15 MG/ML injection 15 mg (15 mg Intravenous Given 06/18/24 0353)   Medical Decision Making:   [name] is a 53 y.o. female who presented to the ED today with headaches and other vague  detailed above.    Patient placed on continuous vitals and telemetry monitoring while in ED which was reviewed periodically.  Complete initial physical exam performed, notably the patient  was HDS in NAD.    Reviewed and confirmed nursing documentation for past medical history, family history, social history.    Initial Assessment:   With the patient's presentation of headache, most likely diagnosis is tension type headaches vs atypical migraines. Other diagnoses were considered including (but not limited to) intracranial mass, intracranial hemorrhage, intracranial infection including meningitis vs encephalitis, GCA, trigeminal neuralgia. These are considered less likely due to history of present  illness and physical exam findings.    This is most consistent with an acute life/limb threatening illness complicated by underlying chronic conditions.   Timeline and slow onset is not consistent with SAH/ICH   Age and description of pain is not consistent with GCA   Lack of fever,meningismus is not consistent with meningitis/encephalitis   Initial Plan:  Will initiate treatment with Compazine/Benardryll/NSAIDS/Tylenol  for treatment of nonspecific headache  Screening labs including CBC and Metabolic panel to evaluate for infectious or metabolic etiology of disease.  Urinalysis with reflex culture ordered to evaluate for UTI or relevant urologic/nephrologic pathology.  Cta H&N to evaluate for structural IC etiology  Objective evaluation as below reviewed   Initial Study Results:   Laboratory  All laboratory results reviewed without evidence of clinically relevant pathology.    EKG EKG was reviewed independently. Rate, rhythm, axis, intervals all examined and without medically relevant abnormality. ST segments without concerns for elevations.    Radiology:  All images reviewed independently. Agree with radiology report at this time.   CT ANGIO HEAD NECK W WO CM  Final Result        Consults: Case discussed with neurology SK.   Final Assessment and Plan:   Patient's history of present on his physical exam most consistent with status migrainosus him given the persistency of her headaches.  Discussed the CT findings with neurology who states that they can be safely followed up in outpatient setting.  We would anticipate available to see substantial neurovascular disease on CT imaging given the duration of symptoms.  Only a marginal increase in MR imaging given if any localization would be posterior.  However stroke is considered grossly inconsistent given the intermittent nature of the symptoms, the presence of headache, the waxing and waning nature. The visual loss followed by headache  is more consistent with a typical migraine.  The duration makes it currently status migrainosus.  Fortunately, with medical therapy patient has had robust response.  She is resting comfortably, no longer feels dizzy.  She states the headache is completely resolved and she feels comfortable following up in the outpatient with neurology.  Ambulatory tolerating p.o. intake on reassessment discharge outpatient care.  Clinical Impression:  1. Other migraine with status migrainosus, not intractable      Discharge   Final Clinical Impression(s) / ED Diagnoses Final diagnoses:  Other migraine with status migrainosus, not intractable    Rx / DC Orders ED Discharge Orders     None         Jerral Meth, MD 06/18/24 [phone]

## 2024-06-27 ENCOUNTER — Ambulatory Visit: Admitting: Internal Medicine

## 2024-06-27 ENCOUNTER — Encounter: Payer: Self-pay | Admitting: Internal Medicine

## 2024-06-27 VITALS — BP 138/82 | HR 78 | Temp 98.4°F | Resp 16 | Ht 66.0 in | Wt 190.2 lb

## 2024-06-27 DIAGNOSIS — E118 Type 2 diabetes mellitus with unspecified complications: Secondary | ICD-10-CM

## 2024-06-27 DIAGNOSIS — L2084 Intrinsic (allergic) eczema: Secondary | ICD-10-CM

## 2024-06-27 DIAGNOSIS — H532 Diplopia: Secondary | ICD-10-CM

## 2024-06-27 DIAGNOSIS — E785 Hyperlipidemia, unspecified: Secondary | ICD-10-CM

## 2024-06-27 DIAGNOSIS — Z23 Encounter for immunization: Secondary | ICD-10-CM | POA: Diagnosis not present

## 2024-06-27 DIAGNOSIS — I10 Essential (primary) hypertension: Secondary | ICD-10-CM | POA: Diagnosis not present

## 2024-06-27 DIAGNOSIS — B181 Chronic viral hepatitis B without delta-agent: Secondary | ICD-10-CM

## 2024-06-27 DIAGNOSIS — L989 Disorder of the skin and subcutaneous tissue, unspecified: Secondary | ICD-10-CM

## 2024-06-27 LAB — TSH: TSH: 4.56 u[IU]/mL (ref 0.35–5.50)

## 2024-06-27 LAB — HEMOGLOBIN A1C: Hgb A1c MFr Bld: 6.1 % (ref 4.6–6.5)

## 2024-06-27 LAB — LIPID PANEL
Cholesterol: 204 mg/dL — ABNORMAL HIGH (ref 0–200)
HDL: 52.6 mg/dL (ref >39.00)
LDL Cholesterol: 137 mg/dL — ABNORMAL HIGH (ref 0–99)
NonHDL: 151.43
Total CHOL/HDL Ratio: 4
Triglycerides: 71 mg/dL (ref 0.0–149.0)
VLDL: 14.2 mg/dL (ref 0.0–40.0)

## 2024-06-27 MED ORDER — COVID-19 MRNA VAC-TRIS(PFIZER) 30 MCG/0.3ML IM SUSY: 0.3000 mL | PREFILLED_SYRINGE | Freq: Once | INTRAMUSCULAR | 0 refills | Status: AC

## 2024-06-27 MED ORDER — ROSUVASTATIN CALCIUM 10 MG PO TABS: 10.0000 mg | ORAL_TABLET | Freq: Every day | ORAL | 1 refills | Status: AC

## 2024-06-27 MED ORDER — ZORYVE 0.15 % EX CREA: 1.0000 | TOPICAL_CREAM | Freq: Every day | CUTANEOUS | 3 refills | Status: AC

## 2024-06-27 NOTE — Progress Notes (Signed)
 Subjective:  Patient ID: [name], female    DOB: October 31, 1970  Age: 54 y.o. MRN: [id number]  CC: Diabetes, Hypertension, and Rash   HPI [name] presents for f/up ---  Discussed the use of AI scribe software for clinical note transcription with the patient, who gave verbal consent to proceed.  History of Present Illness [name] is a 53 year old female who presents with dizziness, tiredness, and headache.  She has been experiencing dizziness, tiredness, and headaches, which led her to visit the emergency room. A CT scan and blood work were performed, and the results were normal. Despite this, she continues to experience symptoms, although the dizziness has improved and now comes and goes. She also experienced double vision on the day she went to the emergency room, but this has since resolved. No current double vision or other vision problems.  She describes having low energy and leg cramps, particularly in the morning when she wakes up, making it difficult to stand. She also reports a burning sensation in her feet when wearing shoes. She takes Tylenol  for pain relief, which provides temporary relief for a couple of hours. She takes Tylenol  for pain relief, but the specific dosage is unclear.  She mentions having eczema, which has worsened and become painful. She has been applying cortisone, but it seems ineffective. The rash is located around her right ankle and has increased in size since her last visit. It is itchy and painful.     Outpatient Medications Prior to Visit  Medication Sig Dispense Refill   acetaminophen  (TYLENOL ) 500 MG tablet Take 500 mg by mouth every 6 (six) hours as needed for headache (pain).     Azelastine HCl 137 MCG/SPRAY SOLN Place 1 spray into the nose as needed.     diclofenac  Sodium (VOLTAREN  ARTHRITIS PAIN) 1 % GEL Apply 2 g topically 4 (four) times daily. 350 g 0   Multiple Vitamins-Minerals (CENTRUM ADULTS) TABS Take  by mouth.     rivaroxaban  (XARELTO ) 20 MG TABS tablet Take 1 tablet (20 mg total) by mouth every morning. 90 tablet 0   tiZANidine  (ZANAFLEX ) 2 MG tablet Take 1 tablet (2 mg total) by mouth every 8 (eight) hours as needed for muscle spasms. 270 tablet 0   fluticasone  (FLONASE ) 50 MCG/ACT nasal spray Place 1 spray into both nostrils daily. 16 g 2   rosuvastatin  (CRESTOR ) 10 MG tablet Take 1 tablet (10 mg total) by mouth daily. (Patient not taking: Reported on 03/22/2024) 90 tablet 1   No facility-administered medications prior to visit.    ROS Review of Systems  Constitutional:  Positive for unexpected weight change (wt gain). Negative for appetite change, chills, diaphoresis and fatigue.  HENT: Negative.    Eyes: Negative.   Respiratory: Negative.  Negative for cough, chest tightness, shortness of breath and wheezing.   Cardiovascular:  Negative for chest pain, palpitations and leg swelling.  Gastrointestinal: Negative.  Negative for abdominal pain, constipation, diarrhea, nausea and vomiting.  Endocrine: Negative.   Genitourinary: Negative.  Negative for difficulty urinating, dysuria and hematuria.  Musculoskeletal: Negative.  Negative for myalgias.  Skin:  Positive for color change and rash. Negative for pallor and wound.  Neurological:  Negative for dizziness, weakness and light-headedness.  Hematological:  Negative for adenopathy. Does not bruise/bleed easily.  Psychiatric/Behavioral: Negative.      Objective:  BP 138/82 (BP Location: Left Arm, Patient Position: Sitting, Cuff Size: Normal)   Pulse  78   Temp 98.4 F (36.9 C) (Oral)   Resp 16   Ht 5' 6 (1.676 m)   Wt 190 lb 3.2 oz (86.3 kg)   LMP 07/24/2018 (Exact Date)   SpO2 98%   BMI 30.70 kg/m   BP Readings from Last 3 Encounters:  06/27/24 138/82  06/18/24 109/79  06/17/24 120/81    Wt Readings from Last 3 Encounters:  06/27/24 190 lb 3.2 oz (86.3 kg)  03/22/24 184 lb 9.6 oz (83.7 kg)  01/06/24 185 lb 8 oz (84.1  kg)    Physical Exam Vitals reviewed.  Constitutional:      Appearance: Normal appearance.  HENT:     Mouth/Throat:     Mouth: Mucous membranes are moist.  Eyes:     General: No scleral icterus.    Conjunctiva/sclera: Conjunctivae normal.  Cardiovascular:     Rate and Rhythm: Normal rate and regular rhythm.     Heart sounds: No murmur heard.    No friction rub. No gallop.  Pulmonary:     Effort: Pulmonary effort is normal.     Breath sounds: No stridor. No wheezing, rhonchi or rales.  Abdominal:     General: Abdomen is flat.     Palpations: There is no mass.     Tenderness: There is no abdominal tenderness. There is no guarding.     Hernia: No hernia is present.  Musculoskeletal:        General: No swelling. Normal range of motion.     Cervical back: Neck supple.     Right lower leg: No edema.     Left lower leg: No edema.  Lymphadenopathy:     Cervical: No cervical adenopathy.  Skin:    Coloration: Skin is not jaundiced.     Findings: Lesion and rash present. No erythema.  Neurological:     General: No focal deficit present.     Mental Status: She is alert.  Psychiatric:        Mood and Affect: Mood normal.        Behavior: Behavior normal.     Lab Results  Component Value Date   WBC 4.6 06/17/2024   HGB 15.0 06/17/2024   HCT 44.0 06/17/2024   PLT 245 06/17/2024   GLUCOSE 85 06/17/2024   CHOL 204 (H) 06/27/2024   TRIG 71.0 06/27/2024   HDL 52.60 06/27/2024   LDLCALC 137 (H) 06/27/2024   ALT 20 06/17/2024   AST 28 06/17/2024   NA 141 06/17/2024   K 4.4 06/17/2024   CL 105 06/17/2024   CREATININE 1.10 (H) 06/17/2024   BUN 16 06/17/2024   CO2 24 06/17/2024   TSH 4.56 06/27/2024   INR 2.80 03/20/2012   HGBA1C 6.1 06/27/2024   MICROALBUR <0.7 11/28/2023    CT ANGIO HEAD NECK W WO CM Result Date: 06/17/2024 EXAM: CTA Head and Neck with Intravenous Contrast. CT Head without Contrast. CLINICAL HISTORY: Diplopia; Vertebral artery dissection suspected;  dizziness, resolved diplopia, pulsatile tinnitus. TECHNIQUE: Axial CTA images of the head and neck performed with intravenous contrast. MIP reconstructed images were created and reviewed. Axial computed tomography images of the head/brain performed without intravenous contrast. Note: Per PQRS, the description of internal carotid artery percent stenosis, including 0 percent or normal exam, is based on [employer] American Symptomatic Carotid Endarterectomy Trial (NASCET) criteria. Dose reduction technique was used including one or more of the following: automated exposure control, adjustment of mA and kV according to patient size, and/or iterative reconstruction.  CONTRAST: With; COMPARISON: MRI head 8722 FINDINGS: BRAIN: No acute intraparenchymal hemorrhage. No mass lesion. No CT evidence for acute territorial infarct. No midline shift or extra-axial collection. VENTRICLES: No hydrocephalus. ORBITS: The orbits are unremarkable. SINUSES AND MASTOIDS: The paranasal sinuses and mastoid air cells are clear. COMMON CAROTID ARTERIES: No significant stenosis. No dissection or occlusion. INTERNAL CAROTID ARTERIES: No stenosis by NASCET criteria. No dissection or occlusion. Mild irregularity of the ICAs near the skull base with a somewhat beaded appearance. VERTEBRAL ARTERIES: No significant stenosis. No dissection or occlusion. ANTERIOR CEREBRAL ARTERIES: No significant stenosis. No occlusion. No aneurysm. MIDDLE CEREBRAL ARTERIES: No significant stenosis. No occlusion. No aneurysm. POSTERIOR CEREBRAL ARTERIES: No significant stenosis. No occlusion. No aneurysm. BASILAR ARTERY: No significant stenosis. No occlusion. No aneurysm. SOFT TISSUES: No acute finding. No masses or lymphadenopathy. BONES: No acute osseous abnormality. IMPRESSION: 1. No evidence of acute intracranial abnormality. 2. No evidence of significant stenosis or dissection. 3. Mild irregularity of the ICAs near the skull base with a somewhat beaded appearance,  which can be seen with fibromuscular dysplasia. Electronically signed by: Gilmore Molt MD 06/17/2024 07:25 PM EST RP Workstation: [id number]   The 10-year ASCVD risk score (Arnett DK, et al., 2019) is: 4.7%   Values used to calculate the score:     Age: 74 years     Clincally relevant sex: Female     Is Non-Hispanic African American: No     Diabetic: Yes     Tobacco smoker: No     Systolic Blood Pressure: 138 mmHg     Is BP treated: No     HDL Cholesterol: 52.6 mg/dL     Total Cholesterol: 204 mg/dL   Estimated Creatinine Clearance: 63.9 mL/min (A) (by C-G formula based on SCr of 1.1 mg/dL (H)).   Assessment & Plan:  Need for immunization against influenza -     Flu vaccine trivalent PF, 6mos and older(Flulaval,Afluria,Fluarix,Fluzone)  Intrinsic atopic dermatitis -     Zoryve ; Apply 1 Act topically daily.  Dispense: 60 g; Refill: 3  Double vision -     Ambulatory referral to Ophthalmology  Primary hypertension -     TSH; Future -     AMB Referral VBCI Care Management  Type II diabetes mellitus with manifestations (HCC) -     Ambulatory referral to Ophthalmology -     COVID-19 mRNA Vac-TriS(Pfizer); Inject 0.3 mLs into the muscle once for 1 dose.  Dispense: 0.3 mL; Refill: 0 -     Hemoglobin A1c; Future -     AMB Referral VBCI Care Management  Hyperlipidemia with target LDL less than 100- Will start a statin for CV risk reduction. -     TSH; Future -     Lipid panel; Future -     AMB Referral VBCI Care Management -     Rosuvastatin  Calcium ; Take 1 tablet (10 mg total) by mouth daily.  Dispense: 90 tablet; Refill: 1  Skin lesion of left leg -     Ambulatory referral to Dermatology  Chronic viral hepatitis B without delta agent and without coma (HCC)- Will evaluate for HCC. -     US  ABDOMEN LIMITED RUQ (LIVER/GB); Future     Follow-up: Return in about 6 months (around 12/25/2024).  Debby Molt, MD

## 2024-06-27 NOTE — Patient Instructions (Signed)

## 2024-07-03 ENCOUNTER — Telehealth: Payer: Self-pay | Admitting: *Deleted

## 2024-07-03 NOTE — Progress Notes (Unsigned)
 Care Guide Pharmacy Note  07/03/2024 Name: [name] MRN: [id number] DOB: [birth year]  Referred By: Joshua Debby CROME, MD Reason for referral: Call Attempt #1 (Outreach to schedule referral with pharmacist )   [name] is a 53 y.o. year old female who is a primary care patient of Joshua Debby CROME, MD.  [name] was referred to the pharmacist for assistance related to: DMII  An unsuccessful telephone outreach was attempted today to contact the patient who was referred to the pharmacy team for assistance with medication management. Additional attempts will be made to contact the patient.  [name], CMA [place]  [facility], [facility] Guide Direct Dial: [phone]  Fax: [phone] Website: [email or web address]

## 2024-07-04 ENCOUNTER — Telehealth: Payer: Self-pay

## 2024-07-04 NOTE — Progress Notes (Unsigned)
 Complex Care Management Note Care Guide Note  07/04/2024 Name: [name] MRN: [id number] DOB: 07/31/1971   Complex Care Management Outreach Attempts: A second unsuccessful outreach was attempted today to offer the patient with information about available complex care management services.  Follow Up Plan:  Additional outreach attempts will be made to offer the patient complex care management information and services.   Encounter Outcome:  No Answer  [name], CMA [place]  [place], [facility] Guide Direct Dial: [phone]  Fax: [phone] Website: [email or web address]

## 2024-07-04 NOTE — Telephone Encounter (Signed)
 Copied from CRM #[id number]. Topic: Clinical - Medication Question >> Jul 04, 2024  3:56 PM Lauren C wrote: Reason for CRM: [employer] calling for clarification on Roflumilast  (ZORYVE ) 0.15 % CREAM. Wants to know maximum grams per day or explicit application sites where they would be applying. Best call back # is [phone].

## 2024-07-05 ENCOUNTER — Other Ambulatory Visit: Payer: Self-pay | Admitting: Internal Medicine

## 2024-07-05 DIAGNOSIS — L2084 Intrinsic (allergic) eczema: Secondary | ICD-10-CM

## 2024-07-05 NOTE — Progress Notes (Signed)
 Care Guide Pharmacy Note  07/05/2024 Name: [name] MRN: [id number] DOB: 05-07-1971  Referred By: Joshua Debby CROME, MD Reason for referral: Call Attempt #1 (Outreach to schedule referral with pharmacist )   [name] is a 53 y.o. year old female who is a primary care patient of Joshua Debby CROME, MD.  [name] was referred to the pharmacist for assistance related to: DMII  A third unsuccessful telephone outreach was attempted today to contact the patient who was referred to the pharmacy team for assistance with medication management. The Population Health team is pleased to engage with this patient at any time in the future upon receipt of referral and should he/she be interested in assistance from the Population Health team.  [name], CMA [facility]  [facility], [facility] Guide Direct Dial: [phone]  Fax: [phone] Website: [email or web address]

## 2024-07-06 ENCOUNTER — Other Ambulatory Visit: Payer: Self-pay | Admitting: Internal Medicine

## 2024-07-06 DIAGNOSIS — L2084 Intrinsic (allergic) eczema: Secondary | ICD-10-CM

## 2024-07-06 MED ORDER — BETAMETHASONE DIPROPIONATE AUG 0.05 % EX CREA: TOPICAL_CREAM | Freq: Two times a day (BID) | CUTANEOUS | 1 refills | Status: AC

## 2024-07-09 ENCOUNTER — Ambulatory Visit: Payer: Self-pay | Admitting: Internal Medicine
